# Patient Record
Sex: Male | Born: 1975 | Race: Black or African American | Hispanic: No | Marital: Married | Smoking: Never smoker
Health system: Southern US, Community
[De-identification: ages and names within clinical notes are randomized; demographics above are authoritative.]

## PROBLEM LIST (undated history)

## (undated) DIAGNOSIS — F32A Depression, unspecified: Secondary | ICD-10-CM

## (undated) DIAGNOSIS — Z973 Presence of spectacles and contact lenses: Secondary | ICD-10-CM

## (undated) DIAGNOSIS — G4730 Sleep apnea, unspecified: Secondary | ICD-10-CM

## (undated) DIAGNOSIS — F419 Anxiety disorder, unspecified: Secondary | ICD-10-CM

## (undated) DIAGNOSIS — G43909 Migraine, unspecified, not intractable, without status migrainosus: Secondary | ICD-10-CM

## (undated) DIAGNOSIS — R0981 Nasal congestion: Secondary | ICD-10-CM

## (undated) DIAGNOSIS — I10 Essential (primary) hypertension: Secondary | ICD-10-CM

## (undated) DIAGNOSIS — E119 Type 2 diabetes mellitus without complications: Secondary | ICD-10-CM

## (undated) DIAGNOSIS — E669 Obesity, unspecified: Secondary | ICD-10-CM

## (undated) DIAGNOSIS — F909 Attention-deficit hyperactivity disorder, unspecified type: Secondary | ICD-10-CM

## (undated) HISTORY — DX: Attention-deficit hyperactivity disorder, unspecified type: F90.9

## (undated) HISTORY — DX: Depression, unspecified: F32.A

## (undated) HISTORY — DX: Obesity, unspecified: E66.9

## (undated) HISTORY — DX: Migraine, unspecified, not intractable, without status migrainosus: G43.909

## (undated) HISTORY — DX: Anxiety disorder, unspecified: F41.9

## (undated) HISTORY — DX: Presence of spectacles and contact lenses: Z97.3

## (undated) HISTORY — DX: Essential (primary) hypertension: I10

---

## 2012-02-03 ENCOUNTER — Ambulatory Visit: Payer: Self-pay | Admitting: Urology

## 2014-05-15 ENCOUNTER — Ambulatory Visit (INDEPENDENT_AMBULATORY_CARE_PROVIDER_SITE_OTHER): Payer: BC Managed Care – PPO | Admitting: Medical

## 2014-05-15 ENCOUNTER — Encounter: Payer: Self-pay | Admitting: Medical

## 2014-05-15 VITALS — BP 120/82 | HR 88 | Temp 98.0°F | Resp 14 | Ht 68.0 in | Wt 342.0 lb

## 2014-05-15 DIAGNOSIS — Z9989 Dependence on other enabling machines and devices: Secondary | ICD-10-CM

## 2014-05-15 DIAGNOSIS — G4733 Obstructive sleep apnea (adult) (pediatric): Secondary | ICD-10-CM

## 2014-05-15 DIAGNOSIS — E669 Obesity, unspecified: Secondary | ICD-10-CM

## 2014-05-15 DIAGNOSIS — I10 Essential (primary) hypertension: Secondary | ICD-10-CM

## 2014-05-15 MED ORDER — BENAZEPRIL-HYDROCHLOROTHIAZIDE 20-12.5 MG PO TABS: 2.0000 | ORAL_TABLET | Freq: Every day | ORAL | Status: DC

## 2014-05-15 MED ORDER — AMLODIPINE BESYLATE 5 MG PO TABS: 5.0000 mg | ORAL_TABLET | Freq: Every day | ORAL | Status: DC

## 2014-05-15 NOTE — Progress Notes (Signed)
Subjective:  [name] is a 38 y.o. male presenting as a new patient here today to establish his primary care. Previously he was being [place] but lately it has become more difficult for him to make it all the way out there. His fiance comes here and scheduled his appointment today. He reports that his HTN was diagnosed two years ago. He is currently on 2 BP medications and reports excellent compliance. His home BP measurements are usually within normal limits but tends to run higher at night. He avoids added salt with his food, does not exercise. His last labs were done in 2014 with his previous PCP. He has also been diagnosed with OSA and uses a CPAP. No other aggravating or relieving factors.  Patient has no other questions or concerns.   Objective:  BP 120/82  Pulse 88  Temp(Src) 98 F (36.7 C)  Resp 14  Ht 5\' 8"  (1.727 m)  Wt 342 lb (155.13 kg)  BMI 52.01 kg/m2  General appearance: alert, no distress, WD/WN, morbidly obese African-American male  Heart: RRR, normal S1, S2, no murmurs Lungs: CTA bilaterally, no wheezes, rhonchi, or rales Pulses: 2+ symmetric, upper and lower extremities, normal cap refill Ext: no peripheral edema  Assessment & Plan: Encounter Diagnoses  Name Primary?  . Essential hypertension, benign Yes  . Obesity, unspecified   . OSA on CPAP     C/t same medication, need to exercise, need to work on weight loss, better diet.  C/t current medications, CPAP, and we will request prior records.

## 2014-05-23 ENCOUNTER — Telehealth: Payer: Self-pay | Admitting: Medical

## 2014-05-23 NOTE — Telephone Encounter (Signed)
Already sorted. CLS

## 2014-05-23 NOTE — Telephone Encounter (Signed)
Records received and a chart was made. I am sending records back to [facility] to review and [facility]

## 2014-08-05 ENCOUNTER — Emergency Department (HOSPITAL_COMMUNITY)
Admission: EM | Admit: 2014-08-05 | Discharge: 2014-08-05 | Disposition: A | Discharge status: Home / Self Care | Payer: BC Managed Care – PPO | Attending: Emergency Medicine | Admitting: Emergency Medicine

## 2014-08-05 ENCOUNTER — Emergency Department (HOSPITAL_COMMUNITY): Payer: BC Managed Care – PPO

## 2014-08-05 ENCOUNTER — Encounter (HOSPITAL_COMMUNITY): Payer: Self-pay | Admitting: Emergency Medicine

## 2014-08-05 DIAGNOSIS — I10 Essential (primary) hypertension: Secondary | ICD-10-CM | POA: Insufficient documentation

## 2014-08-05 DIAGNOSIS — R079 Chest pain, unspecified: Secondary | ICD-10-CM | POA: Insufficient documentation

## 2014-08-05 DIAGNOSIS — Z79899 Other long term (current) drug therapy: Secondary | ICD-10-CM | POA: Diagnosis not present

## 2014-08-05 DIAGNOSIS — K8020 Calculus of gallbladder without cholecystitis without obstruction: Secondary | ICD-10-CM

## 2014-08-05 DIAGNOSIS — R0602 Shortness of breath: Secondary | ICD-10-CM | POA: Diagnosis not present

## 2014-08-05 DIAGNOSIS — R319 Hematuria, unspecified: Secondary | ICD-10-CM | POA: Diagnosis not present

## 2014-08-05 DIAGNOSIS — R61 Generalized hyperhidrosis: Secondary | ICD-10-CM | POA: Diagnosis not present

## 2014-08-05 DIAGNOSIS — E876 Hypokalemia: Secondary | ICD-10-CM | POA: Insufficient documentation

## 2014-08-05 DIAGNOSIS — K8050 Calculus of bile duct without cholangitis or cholecystitis without obstruction: Secondary | ICD-10-CM

## 2014-08-05 DIAGNOSIS — R6883 Chills (without fever): Secondary | ICD-10-CM | POA: Diagnosis not present

## 2014-08-05 DIAGNOSIS — R1011 Right upper quadrant pain: Secondary | ICD-10-CM | POA: Diagnosis present

## 2014-08-05 LAB — URINALYSIS, ROUTINE W REFLEX MICROSCOPIC
Bilirubin Urine: NEGATIVE
Glucose, UA: 250 mg/dL — AB
Ketones, ur: 15 mg/dL — AB
Leukocytes, UA: NEGATIVE
NITRITE: NEGATIVE
Protein, ur: NEGATIVE mg/dL
Specific Gravity, Urine: 1.013 (ref 1.005–1.030)
Urobilinogen, UA: 0.2 mg/dL (ref 0.0–1.0)
pH: 7.5 (ref 5.0–8.0)

## 2014-08-05 LAB — CBC WITH DIFFERENTIAL/PLATELET
Basophils Absolute: 0 10*3/uL (ref 0.0–0.1)
Basophils Relative: 0 % (ref 0–1)
Eosinophils Absolute: 0 10*3/uL (ref 0.0–0.7)
Eosinophils Relative: 0 % (ref 0–5)
HEMATOCRIT: 38.1 % — AB (ref 39.0–52.0)
Hemoglobin: 13.2 g/dL (ref 13.0–17.0)
LYMPHS PCT: 13 % (ref 12–46)
Lymphs Abs: 0.9 10*3/uL (ref 0.7–4.0)
MCH: 27.9 pg (ref 26.0–34.0)
MCHC: 34.6 g/dL (ref 30.0–36.0)
MCV: 80.5 fL (ref 78.0–100.0)
MONO ABS: 0.4 10*3/uL (ref 0.1–1.0)
Monocytes Relative: 5 % (ref 3–12)
Neutro Abs: 5.7 10*3/uL (ref 1.7–7.7)
Neutrophils Relative %: 82 % — ABNORMAL HIGH (ref 43–77)
Platelets: 173 10*3/uL (ref 150–400)
RBC: 4.73 MIL/uL (ref 4.22–5.81)
RDW: 13.3 % (ref 11.5–15.5)
WBC: 7 10*3/uL (ref 4.0–10.5)

## 2014-08-05 LAB — COMPREHENSIVE METABOLIC PANEL
ALBUMIN: 4 g/dL (ref 3.5–5.2)
ALT: 27 U/L (ref 0–53)
AST: 24 U/L (ref 0–37)
Alkaline Phosphatase: 54 U/L (ref 39–117)
Anion gap: 15 (ref 5–15)
BILIRUBIN TOTAL: 0.8 mg/dL (ref 0.3–1.2)
BUN: 12 mg/dL (ref 6–23)
CHLORIDE: 95 meq/L — AB (ref 96–112)
CO2: 28 mEq/L (ref 19–32)
Calcium: 8.9 mg/dL (ref 8.4–10.5)
Creatinine, Ser: 0.88 mg/dL (ref 0.50–1.35)
GFR calc Af Amer: >90 mL/min (ref >90)
GFR calc non Af Amer: >90 mL/min (ref >90)
Glucose, Bld: 224 mg/dL — ABNORMAL HIGH (ref 70–99)
POTASSIUM: 3 meq/L — AB (ref 3.7–5.3)
SODIUM: 138 meq/L (ref 137–147)
Total Protein: 7.4 g/dL (ref 6.0–8.3)

## 2014-08-05 LAB — URINE MICROSCOPIC-ADD ON

## 2014-08-05 LAB — TROPONIN I: Troponin I: <0.3 ng/mL (ref <0.30)

## 2014-08-05 LAB — LIPASE, BLOOD: Lipase: 29 U/L (ref 11–59)

## 2014-08-05 MED ORDER — OXYCODONE-ACETAMINOPHEN 5-325 MG PO TABS: 1.0000 | ORAL_TABLET | Freq: Four times a day (QID) | ORAL | Status: DC | PRN

## 2014-08-05 MED ORDER — GI COCKTAIL ~~LOC~~
30.0000 mL | Freq: Once | ORAL | Status: AC
  Administered 2014-08-05: 30 mL via ORAL
  Filled 2014-08-05: qty 30

## 2014-08-05 MED ORDER — HYDROMORPHONE HCL 1 MG/ML IJ SOLN
1.0000 mg | Freq: Once | INTRAMUSCULAR | Status: AC
  Administered 2014-08-05: 1 mg via INTRAVENOUS
  Filled 2014-08-05: qty 1

## 2014-08-05 MED ORDER — SODIUM CHLORIDE 0.9 % IV BOLUS (SEPSIS)
500.0000 mL | Freq: Once | INTRAVENOUS | Status: AC
  Administered 2014-08-05: 500 mL via INTRAVENOUS

## 2014-08-05 MED ORDER — ONDANSETRON HCL 4 MG PO TABS: 4.0000 mg | ORAL_TABLET | Freq: Four times a day (QID) | ORAL | Status: DC

## 2014-08-05 MED ORDER — POTASSIUM CHLORIDE CRYS ER 20 MEQ PO TBCR
40.0000 meq | EXTENDED_RELEASE_TABLET | Freq: Once | ORAL | Status: AC
  Administered 2014-08-05: 40 meq via ORAL
  Filled 2014-08-05: qty 2

## 2014-08-05 MED ORDER — FENTANYL CITRATE 0.05 MG/ML IJ SOLN
100.0000 ug | Freq: Once | INTRAMUSCULAR | Status: AC
  Administered 2014-08-05: 100 ug via INTRAVENOUS
  Filled 2014-08-05: qty 2

## 2014-08-05 NOTE — ED Notes (Signed)
PT ambulated with baseline gait; VSS; A&Ox3; no signs of distress; respirations even and unlabored; skin warm and dry; no questions upon discharge.  

## 2014-08-05 NOTE — ED Notes (Signed)
Patient transported to Ultrasound 

## 2014-08-05 NOTE — ED Notes (Signed)
The pt has had epigastric pain for 3 days with bi-lateral radiation posteriorly.  He still has his gb

## 2014-08-05 NOTE — ED Provider Notes (Signed)
CSN: [id number]     Arrival date & time 08/05/14  4098 History   First MD Initiated Contact with Patient 08/05/14 [phone]     Chief Complaint  Patient presents with  . Abdominal Pain   Patient is a 38 y.o. male presenting with abdominal pain.  Abdominal Pain Associated symptoms: chest pain, chills, nausea, shortness of breath and vomiting   Associated symptoms: no constipation, no cough, no diarrhea, no dysuria, no fatigue, no fever and no hematuria     Patient is a 38 y.o. Male who presents to the ED with 3 day history of Epigastric pain x 3 days.  Per the patient and his wife the patient has had 3 days of constant waxing and waning epigastric and RUQ abdominal pain that radiates to the back.  This pain is associated with chest pain, shortness of breath, nausea, and one episode of vomiting.  Patient tried taking an antacid with no relief.  Patient has never had pain like this before.  Pain wakes the patient from sleep.  Patient has no history of abdominal surgeries.    Past Medical History  Diagnosis Date  . Hypertension    History reviewed. No pertinent past surgical history. No family history on file. History  Substance Use Topics  . Smoking status: Never Smoker   . Smokeless tobacco: Not on file  . Alcohol Use: Yes    Review of Systems  Constitutional: Positive for chills and diaphoresis. Negative for fever and fatigue.  Respiratory: Positive for shortness of breath. Negative for cough, chest tightness and wheezing.   Cardiovascular: Positive for chest pain. Negative for palpitations and leg swelling.  Gastrointestinal: Positive for nausea, vomiting and abdominal pain. Negative for diarrhea, constipation, blood in stool, abdominal distention, anal bleeding and rectal pain.  Genitourinary: Negative for dysuria, urgency, frequency and hematuria.  Musculoskeletal: Positive for back pain.  All other systems reviewed and are negative.     Allergies  Review of patient's allergies  indicates no known allergies.  Home Medications   Prior to Admission medications   Medication Sig Start Date End Date Taking? Authorizing Provider  benazepril-hydrochlorthiazide (LOTENSIN HCT) 20-12.5 MG per tablet Take 2 tablets by mouth daily.   Yes Historical Provider, MD  bismuth subsalicylate (PEPTO BISMOL) 262 MG chewable tablet Chew 524 mg by mouth as needed for indigestion.   Yes Historical Provider, MD  triamcinolone (NASACORT AQ) 55 MCG/ACT AERO nasal inhaler Place 2 sprays into the nose daily as needed (for allergies).   Yes Historical Provider, MD   BP 149/98  Pulse 90  Temp(Src) 97.9 F (36.6 C) (Oral)  Resp 20  Ht 5\' 8"  (1.727 m)  Wt 320 lb (145.151 kg)  BMI 48.67 kg/m2  SpO2 95% Physical Exam  Nursing note and vitals reviewed. Constitutional: He is oriented to person, place, and time. He appears well-developed and well-nourished. No distress.  HENT:  Head: Normocephalic and atraumatic.  Mouth/Throat: Oropharynx is clear and moist. No oropharyngeal exudate.  Eyes: Conjunctivae and EOM are normal. Pupils are equal, round, and reactive to light. No scleral icterus.  Neck: Normal range of motion. Neck supple. No JVD present. No thyromegaly present.  Cardiovascular: Normal rate, regular rhythm, normal heart sounds and intact distal pulses.  Exam reveals no gallop and no friction rub.   No murmur heard. Pulmonary/Chest: Effort normal and breath sounds normal. No respiratory distress. He has no wheezes. He has no rales. He exhibits no tenderness.  Abdominal: Soft. Normal appearance and bowel sounds are  normal. He exhibits no distension and no mass. There is tenderness in the right upper quadrant and epigastric area. There is positive Murphy's sign. There is no rigidity, no rebound, no guarding and no tenderness at McBurney's point.  Musculoskeletal: Normal range of motion.  Lymphadenopathy:    He has no cervical adenopathy.  Neurological: He is alert and oriented to person,  place, and time. No cranial nerve deficit. Coordination normal.  Skin: Skin is warm and dry. He is not diaphoretic.  Psychiatric: He has a normal mood and affect. His behavior is normal. Judgment and thought content normal.    ED Course  Procedures (including critical care time) Labs Review Labs Reviewed  COMPREHENSIVE METABOLIC PANEL - Abnormal; Notable for the following:    Potassium 3.0 (*)    Chloride 95 (*)    Glucose, Bld 224 (*)    All other components within normal limits  CBC WITH DIFFERENTIAL - Abnormal; Notable for the following:    HCT 38.1 (*)    Neutrophils Relative % 82 (*)    All other components within normal limits  URINALYSIS, ROUTINE W REFLEX MICROSCOPIC - Abnormal; Notable for the following:    Glucose, UA 250 (*)    Hgb urine dipstick MODERATE (*)    Ketones, ur 15 (*)    All other components within normal limits  LIPASE, BLOOD  TROPONIN I  URINE MICROSCOPIC-ADD ON    Imaging Review Dg Abd Acute W/chest  08/05/2014   CLINICAL DATA:  Upper abdominal pain for 2 days. Nausea and vomiting. Initial encounter.  EXAM: ACUTE ABDOMEN SERIES (ABDOMEN 2 VIEW & CHEST 1 VIEW)  COMPARISON:  None.  FINDINGS: Examination is degraded due to patient body habitus.  Borderline enlarged cardiac silhouette and mediastinal contours, possibly accentuated due to decreased lung volumes. Minimal bibasilar heterogeneous opacities, left greater than right. No focal airspace opacities. No pleural effusion or pneumothorax. No evidence of edema.  Paucity of bowel gas without definite evidence of obstruction. Unremarkable colonic stool burden. No pneumoperitoneum, pneumatosis or portal venous gas.  Several phleboliths overlie the right hemipelvis. No definitive abnormal opacities overlie the expected location of either renal fossa, ureter or the urinary bladder.  Unchanged bones.  IMPRESSION: 1. Cardiomegaly and bibasilar atelectasis without definite acute cardiopulmonary disease on this  hypoventilated examination. 2. Paucity of bowel gas without evidence of obstruction.   Electronically Signed   By: Simonne Come M.D.   On: 08/05/2014 07:52   US Abdomen Limited Ruq  08/05/2014   CLINICAL DATA:  Right upper quadrant pain for 3 days.  EXAM: US ABDOMEN LIMITED - RIGHT UPPER QUADRANT  COMPARISON:  None.  FINDINGS: Gallbladder:  Multiple mobile gallstones were seen, measuring up to 1.1 cm in size. Gallbladder wall was not thickened, measuring 2.1 mm. Sonographic Murphy sign was negative.  Common bile duct:  Diameter: 4.8 mm  Liver:  No focal lesion identified. Diffusely echogenic and coarse parenchymal echotexture.  IMPRESSION: 1. Cholelithiasis without evidence of acute cholecystitis. No biliary dilatation. 2. Diffusely echogenic liver, nonspecific but suggestive of hepatic steatosis.   Electronically Signed   By: Sebastian Ache   On: 08/05/2014 08:42     EKG Interpretation   Date/Time:  Saturday August 05 2014 06:02:11 EDT Ventricular Rate:  96 PR Interval:  187 QRS Duration: 86 QT Interval:  343 QTC Calculation: 433 R Axis:   25 Text Interpretation:  Sinus rhythm Borderline T wave abnormalities No  previous ECGs available Confirmed by Bebe Shaggy  MD, DONALD ([id number])  on  08/05/2014 6:07:39 AM      MDM   Final diagnoses:  Biliary colic  Calculus of gallbladder without cholecystitis without obstruction  Hypokalemia  Hematuria   Patient is a 38 y.o. Male who presents to the ED with RUQ pain.  Physical exam reveals tenderness to the right upper quadrant and epigastric area.  CBC unremarkable.  CMP reveals hypokalemia and mild hyperglycemia.  Lipase is unremarkable.  LFTs are normal.  Troponin is negative.  Acute abdomen and chest is negative for acute obstruction or PNA.  EKG shows some mild t wave abnormalities, but there are no previous ekgs to compare to.  RUQ ultrasound reveals cholelithiasis with no evidence of cholecystitis or common bile duct blockage.  Patient does have  hematuria on UA, but states that he has had hematuria before.  Bedside ultrasound performed by Dr. Jodi MourningZavitz reveals no evidence of hydronephrosis or kidney stone.  Suspect that pain is likely due to biliary colic.  Will discharge the patient home and with percocet and zofran for sympomatic control.  Patient to follow-up with general surgery and with urology.  Patient to return to the ED for fever, intractable nausea and vomiting, and intractable pain.  Patient states understanding and agreement.  Patient is stable for discharge at this time.  Patient has also been examined by Dr. Jodi MourningZavitz who agrees with the above plan and workup.      Eben Burowourtney A Forcucci, PA-C 08/05/14 [phone]

## 2014-08-05 NOTE — Discharge Instructions (Signed)
Biliary Colic  °Biliary colic is a steady or irregular pain in the upper abdomen. It is usually under the right side of the rib cage. It happens when gallstones interfere with the normal flow of bile from the gallbladder. Bile is a liquid that helps to digest fats. Bile is made in the liver and stored in the gallbladder. When you eat a meal, bile passes from the gallbladder through the cystic duct and the common bile duct into the small intestine. There, it mixes with partially digested food. If a gallstone blocks either of these ducts, the normal flow of bile is blocked. The muscle cells in the bile duct contract forcefully to try to move the stone. This causes the pain of biliary colic.  °SYMPTOMS  °· A person with biliary colic usually complains of pain in the upper abdomen. This pain can be: °¨ In the center of the upper abdomen just below the breastbone. °¨ In the upper-right part of the abdomen, near the gallbladder and liver. °¨ Spread back toward the right shoulder blade. °· Nausea and vomiting. °· The pain usually occurs after eating. °· Biliary colic is usually triggered by the digestive system's demand for bile. The demand for bile is high after fatty meals. Symptoms can also occur when a person who has been fasting suddenly eats a very large meal. Most episodes of biliary colic pass after 1 to 5 hours. After the most intense pain passes, your abdomen may continue to ache mildly for about 24 hours. °DIAGNOSIS  °After you describe your symptoms, your caregiver will perform a physical exam. He or she will pay attention to the upper right portion of your belly (abdomen). This is the area of your liver and gallbladder. An ultrasound will help your caregiver look for gallstones. Specialized scans of the gallbladder may also be done. Blood tests may be done, especially if you have fever or if your pain persists. °PREVENTION  °Biliary colic can be prevented by controlling the risk factors for gallstones. Some of  these risk factors, such as heredity, increasing age, and pregnancy are a normal part of life. Obesity and a high-fat diet are risk factors you can change through a healthy lifestyle. Women going through menopause who take hormone replacement therapy (estrogen) are also more likely to develop biliary colic. °TREATMENT  °· Pain medication may be prescribed. °· You may be encouraged to eat a fat-free diet. °· If the first episode of biliary colic is severe, or episodes of colic keep retuning, surgery to remove the gallbladder (cholecystectomy) is usually recommended. This procedure can be done through small incisions using an instrument called a laparoscope. The procedure often requires a brief stay in the hospital. Some people can leave the hospital the same day. It is the most widely used treatment in people troubled by painful gallstones. It is effective and safe, with no complications in more than 90% of cases. °· If surgery cannot be done, medication that dissolves gallstones may be used. This medication is expensive and can take months or years to work. Only small stones will dissolve. °· Rarely, medication to dissolve gallstones is combined with a procedure called shock-wave lithotripsy. This procedure uses carefully aimed shock waves to break up gallstones. In many people treated with this procedure, gallstones form again within a few years. °PROGNOSIS  °If gallstones block your cystic duct or common bile duct, you are at risk for repeated episodes of biliary colic. There is also a 25% chance that you will develop   a gallbladder infection(acute cholecystitis), or some other complication of gallstones within 10 to 20 years. If you have surgery, schedule it at a time that is convenient for you and at a time when you are not sick. HOME CARE INSTRUCTIONS   Drink plenty of clear fluids.  Avoid fatty, greasy or fried foods, or any foods that make your pain worse.  Take medications as directed. SEEK MEDICAL  CARE IF:   You develop a fever over 100.5 F (38.1 C).  Your pain gets worse over time.  You develop nausea that prevents you from eating and drinking.  You develop vomiting. SEEK IMMEDIATE MEDICAL CARE IF:   You have continuous or severe belly (abdominal) pain which is not relieved with medications.  You develop nausea and vomiting which is not relieved with medications.  You have symptoms of biliary colic and you suddenly develop a fever and shaking chills. This may signal cholecystitis. Call your caregiver immediately.  You develop a yellow color to your skin or the white part of your eyes (jaundice). Document Released: 03/23/2006 Document Revised: 01/12/2012 Document Reviewed: 06/01/2008 [facility] Patient Information 2015 [place]. This information is not intended to replace advice given to you by your health care provider. Make sure you discuss any questions you have with your health care provider.  Hematuria Hematuria is blood in your urine. It can be caused by a bladder infection, kidney infection, prostate infection, kidney stone, or cancer of your urinary tract. Infections can usually be treated with medicine, and a kidney stone usually will pass through your urine. If neither of these is the cause of your hematuria, further workup to find out the reason may be needed. It is very important that you tell your health care provider about any blood you see in your urine, even if the blood stops without treatment or happens without causing pain. Blood in your urine that happens and then stops and then happens again can be a symptom of a very serious condition. Also, pain is not a symptom in the initial stages of many urinary cancers. HOME CARE INSTRUCTIONS   Drink lots of fluid, 3-4 quarts a day. If you have been diagnosed with an infection, cranberry juice is especially recommended, in addition to large amounts of water.  Avoid caffeine, tea, and carbonated beverages because  they tend to irritate the bladder.  Avoid alcohol because it may irritate the prostate.  Take all medicines as directed by your health care provider.  If you were prescribed an antibiotic medicine, finish it all even if you start to feel better.  If you have been diagnosed with a kidney stone, follow your health care provider's instructions regarding straining your urine to catch the stone.  Empty your bladder often. Avoid holding urine for long periods of time.  After a bowel movement, women should cleanse front to back. Use each tissue only once.  Empty your bladder before and after sexual intercourse if you are a male. SEEK MEDICAL CARE IF:  You develop back pain.  You have a fever.  You have a feeling of sickness in your stomach (nausea) or vomiting.  Your symptoms are not better in 3 days. Return sooner if you are getting worse. SEEK IMMEDIATE MEDICAL CARE IF:   You develop severe vomiting and are unable to keep the medicine down.  You develop severe back or abdominal pain despite taking your medicines.  You begin passing a large amount of blood or clots in your urine.  You feel  extremely weak or faint, or you pass out. MAKE SURE YOU:   Understand these instructions.  Will watch your condition.  Will get help right away if you are not doing well or get worse. Document Released: 10/20/2005 Document Revised: 03/06/2014 Document Reviewed: 06/20/2013 [facility] Patient Information 2015 [place], [place]. This information is not intended to replace advice given to you by your health care provider. Make sure you discuss any questions you have with your health care provider.

## 2014-08-05 NOTE — ED Notes (Addendum)
Pt returned from xray and US

## 2014-08-05 NOTE — ED Notes (Signed)
Pt has not taken BP meds last night. MD aware.

## 2014-08-05 NOTE — ED Notes (Signed)
Pt to wait in hallway at nurses station for wife to pick him up.

## 2014-08-07 ENCOUNTER — Encounter: Payer: Self-pay | Admitting: Family Medicine

## 2014-08-07 ENCOUNTER — Other Ambulatory Visit: Payer: Self-pay | Admitting: Medical

## 2014-08-07 ENCOUNTER — Telehealth: Payer: Self-pay | Admitting: Medical

## 2014-08-07 ENCOUNTER — Encounter: Payer: Self-pay | Admitting: Medical

## 2014-08-07 ENCOUNTER — Ambulatory Visit (INDEPENDENT_AMBULATORY_CARE_PROVIDER_SITE_OTHER): Payer: BC Managed Care – PPO | Admitting: Medical

## 2014-08-07 VITALS — BP 112/88 | HR 88 | Temp 98.2°F | Resp 16 | Wt 334.0 lb

## 2014-08-07 DIAGNOSIS — R7301 Impaired fasting glucose: Secondary | ICD-10-CM

## 2014-08-07 DIAGNOSIS — R3129 Other microscopic hematuria: Secondary | ICD-10-CM

## 2014-08-07 DIAGNOSIS — R1010 Upper abdominal pain, unspecified: Secondary | ICD-10-CM

## 2014-08-07 DIAGNOSIS — R3589 Other polyuria: Secondary | ICD-10-CM

## 2014-08-07 DIAGNOSIS — R358 Other polyuria: Secondary | ICD-10-CM

## 2014-08-07 DIAGNOSIS — R312 Other microscopic hematuria: Secondary | ICD-10-CM

## 2014-08-07 DIAGNOSIS — K8020 Calculus of gallbladder without cholecystitis without obstruction: Secondary | ICD-10-CM

## 2014-08-07 DIAGNOSIS — R631 Polydipsia: Secondary | ICD-10-CM

## 2014-08-07 LAB — CBC
HCT: 40 % (ref 39.0–52.0)
Hemoglobin: 13.8 g/dL (ref 13.0–17.0)
MCH: 28.4 pg (ref 26.0–34.0)
MCHC: 34.5 g/dL (ref 30.0–36.0)
MCV: 82.3 fL (ref 78.0–100.0)
Platelets: 168 10*3/uL (ref 150–400)
RBC: 4.86 MIL/uL (ref 4.22–5.81)
RDW: 14.4 % (ref 11.5–15.5)
WBC: 6.8 10*3/uL (ref 4.0–10.5)

## 2014-08-07 LAB — POCT URINALYSIS DIPSTICK
BILIRUBIN UA: NEGATIVE
Glucose, UA: NEGATIVE
Ketones, UA: NEGATIVE
Leukocytes, UA: NEGATIVE
NITRITE UA: NEGATIVE
Spec Grav, UA: <=1.005
Urobilinogen, UA: 4
pH, UA: 7

## 2014-08-07 LAB — COMPREHENSIVE METABOLIC PANEL
ALK PHOS: 54 U/L (ref 39–117)
ALT: 22 U/L (ref 0–53)
AST: 14 U/L (ref 0–37)
Albumin: 4.2 g/dL (ref 3.5–5.2)
BILIRUBIN TOTAL: 1.6 mg/dL — AB (ref 0.2–1.2)
BUN: 10 mg/dL (ref 6–23)
CO2: 32 mEq/L (ref 19–32)
Calcium: 8.6 mg/dL (ref 8.4–10.5)
Chloride: 95 mEq/L — ABNORMAL LOW (ref 96–112)
Creat: 1.16 mg/dL (ref 0.50–1.35)
Glucose, Bld: 141 mg/dL — ABNORMAL HIGH (ref 70–99)
Potassium: 2.8 mEq/L — ABNORMAL LOW (ref 3.5–5.3)
SODIUM: 139 meq/L (ref 135–145)
TOTAL PROTEIN: 6.9 g/dL (ref 6.0–8.3)

## 2014-08-07 LAB — POCT GLYCOSYLATED HEMOGLOBIN (HGB A1C): Hemoglobin A1C: 7.2

## 2014-08-07 LAB — POCT CBG (FASTING - GLUCOSE)-MANUAL ENTRY: Glucose Fasting, POC: 164 mg/dL — AB (ref 70–99)

## 2014-08-07 MED ORDER — ONDANSETRON HCL 4 MG PO TABS: 4.0000 mg | ORAL_TABLET | Freq: Three times a day (TID) | ORAL | Status: DC | PRN

## 2014-08-07 MED ORDER — OXYCODONE-ACETAMINOPHEN 5-325 MG PO TABS: 1.0000 | ORAL_TABLET | ORAL | Status: DC | PRN

## 2014-08-07 NOTE — Telephone Encounter (Signed)
Patient is aware of his appointment to have [facility] CT [facility]08[facility]14 [facility] CLS

## 2014-08-07 NOTE — Telephone Encounter (Signed)
pls set up for CT abdomen and pelvis - Dx: abdomina pain, gall stones, biliary colic, blood in urine.   Preferably for today or tomorrow

## 2014-08-07 NOTE — Progress Notes (Signed)
Subjective: Here for hospital f/u.  Of note, there are 2 medical records since he is [facility] patient.  I reviewed the other record with same name, DOB and address.  Other record listed as [id number].  Here with fiance for hospital f/u.  I have seen him once prior in July for BP and Obesity concerns.   Went to [facility] Friday, 08/05/14 for severe abdominal pain across abdomen, some vomiting, nausea.   Diagnosed with biliary colic, told he needs to f/u with PCM and needs gall bladder out.   Had abdominal ultrasound.   Had gallstones.   Had labs.  symptoms today include nausea, abdominal pain, less severe pain than at the ED.  No appetite.  No blurred vision, but constant headache the last few days.  He does note polyuria and polydipsia.   No other aggravating or relieving factors.  No other c/o.    Past Medical History  Diagnosis Date  . Obesity   . Hypertension   . Migraine    Family hx/o with father end stage renal disease due to diabetes.   ROS as in subjective   Objective: BP 112/88  Pulse 88  Temp(Src) 98.2 F (36.8 C) (Oral)  Resp 16  Wt 334 lb (151.501 kg)  General appearance: alert, no distress, WD/WN, obese AA male Neck: supple, no lymphadenopathy, no thyromegaly, no masses Heart: RRR, normal S1, S2, no murmurs Lungs: CTA bilaterally, no wheezes, rhonchi, or rales Abdomen: +bs, soft, obese abdomen, moderate tenderness RUQ, RLQ, central abdomen, +murphys sign, no rebound or guarding, non distended, no masses, no hepatomegaly, no splenomegaly Back: nontender Pulses: 2+ symmetric, upper and lower extremities, normal cap refill Ext: no edema  Results for orders placed in visit on 08/07/14 (from the past 24 hour(s))  POCT GLYCOSYLATED HEMOGLOBIN (HGB A1C)     Status: None   Collection Time    08/07/14 11:50 AM      Result Value Ref Range   Hemoglobin A1C 7.2    POCT CBG (FASTING - GLUCOSE)-MANUAL ENTRY     Status: None   Collection Time   08/07/14 11:50 AM      Result Value Ref Range   Glucose Fasting, POC 164 lbs  70 - 99 mg/dL  POCT URINALYSIS DIPSTICK     Status: None   Collection Time    08/07/14 11:52 AM      Result Value Ref Range   Color, UA DK. yellow     Clarity, UA clear     Glucose, UA neg     Bilirubin, UA neg     Ketones, UA neg     Spec Grav, UA <=1.005     Blood, UA large     pH, UA 7.0     Protein, UA moderate     Urobilinogen, UA 4.0     Nitrite, UA neg     Leukocytes, UA Negative       Assessment: Encounter Diagnoses  Name Primary?  . Pain of upper abdomen Yes  . Gallstones   . Polydipsia   . Polyuria   . Microscopic hematuria   . Impaired fasting glucose      Plan: I reviewed ED report, labs, [place] from the other medical record documentation noted above.   Potassium was low, glucose was elevated over 200, and he was + for blood and glucose in the urine.  Urinalysis today with protein, large blood, urobilinogen, pH 7.  Glucose fingerstick and HgbA1C suggests new onset  diabetes.    At this point, pain may be attributed to gall stones, but can't rule out other, particularly in light of urine findings and recent labs and ultrasound.  Will recheck CMET lab today, will set up CT abdomen and pelvis, urine micro and culture sent.  He has a new diagnosis of diabetes given the findings.  We discussed diet, weight loss, and will f/u on this once we have the additional results.    He has tentative schedule already with general surgery consult this Friday.  He will keep that appt for now.   Urology consult may or may not be needed pending additional labs.   Refilled zofran and pain medication, note for work for today and tomorrow.    F/u pending additional studies.

## 2014-08-08 ENCOUNTER — Other Ambulatory Visit: Payer: Self-pay | Admitting: Medical

## 2014-08-08 ENCOUNTER — Ambulatory Visit
Admission: RE | Admit: 2014-08-08 | Discharge: 2014-08-08 | Disposition: A | Discharge status: Home / Self Care | Payer: BC Managed Care – PPO | Source: Ambulatory Visit | Attending: Medical | Admitting: Medical

## 2014-08-08 DIAGNOSIS — R109 Unspecified abdominal pain: Principal | ICD-10-CM

## 2014-08-08 DIAGNOSIS — R3589 Other polyuria: Secondary | ICD-10-CM

## 2014-08-08 DIAGNOSIS — R1010 Upper abdominal pain, unspecified: Secondary | ICD-10-CM

## 2014-08-08 DIAGNOSIS — K8020 Calculus of gallbladder without cholecystitis without obstruction: Secondary | ICD-10-CM

## 2014-08-08 DIAGNOSIS — R631 Polydipsia: Secondary | ICD-10-CM

## 2014-08-08 DIAGNOSIS — R7301 Impaired fasting glucose: Secondary | ICD-10-CM

## 2014-08-08 DIAGNOSIS — R102 Pelvic and perineal pain: Secondary | ICD-10-CM

## 2014-08-08 DIAGNOSIS — R3129 Other microscopic hematuria: Secondary | ICD-10-CM

## 2014-08-08 DIAGNOSIS — R358 Other polyuria: Secondary | ICD-10-CM

## 2014-08-08 LAB — URINALYSIS, MICROSCOPIC ONLY
BACTERIA UA: NONE SEEN
Casts: NONE SEEN
Crystals: NONE SEEN

## 2014-08-08 LAB — LIPASE: LIPASE: 15 U/L (ref 0–75)

## 2014-08-08 LAB — AMYLASE: AMYLASE: 34 U/L (ref 0–105)

## 2014-08-08 MED ORDER — POTASSIUM CHLORIDE CRYS ER 20 MEQ PO TBCR: 20.0000 meq | EXTENDED_RELEASE_TABLET | Freq: Two times a day (BID) | ORAL | Status: DC

## 2014-08-08 MED ORDER — IOHEXOL 300 MG/ML  SOLN
150.0000 mL | Freq: Once | INTRAMUSCULAR | Status: AC | PRN
  Administered 2014-08-08: 150 mL via INTRAVENOUS

## 2014-08-09 ENCOUNTER — Encounter: Payer: Self-pay | Admitting: Family Medicine

## 2014-08-09 ENCOUNTER — Telehealth: Payer: Self-pay | Admitting: Family Medicine

## 2014-08-09 ENCOUNTER — Other Ambulatory Visit: Payer: Self-pay

## 2014-08-09 ENCOUNTER — Other Ambulatory Visit (INDEPENDENT_AMBULATORY_CARE_PROVIDER_SITE_OTHER): Payer: BC Managed Care – PPO | Admitting: Medical

## 2014-08-09 ENCOUNTER — Other Ambulatory Visit: Payer: BC Managed Care – PPO

## 2014-08-09 ENCOUNTER — Other Ambulatory Visit: Payer: Self-pay | Admitting: Medical

## 2014-08-09 DIAGNOSIS — R7301 Impaired fasting glucose: Secondary | ICD-10-CM

## 2014-08-09 DIAGNOSIS — R319 Hematuria, unspecified: Secondary | ICD-10-CM

## 2014-08-09 LAB — POCT URINALYSIS DIPSTICK
BILIRUBIN UA: POSITIVE
Blood, UA: 250
Glucose, UA: NEGATIVE
KETONES UA: NEGATIVE
Nitrite, UA: POSITIVE
Protein, UA: 100
SPEC GRAV UA: 1.015
UROBILINOGEN UA: 4
pH, UA: 6

## 2014-08-09 LAB — URINE CULTURE
COLONY COUNT: NO GROWTH
ORGANISM ID, BACTERIA: NO GROWTH

## 2014-08-09 LAB — GLUCOSE, POCT (MANUAL RESULT ENTRY): POC GLUCOSE: 109 mg/dL — AB (ref 70–99)

## 2014-08-09 MED ORDER — METFORMIN HCL 500 MG PO TABS: 500.0000 mg | ORAL_TABLET | Freq: Two times a day (BID) | ORAL | Status: DC

## 2014-08-09 MED ORDER — ONDANSETRON HCL 4 MG PO TABS: 4.0000 mg | ORAL_TABLET | Freq: Three times a day (TID) | ORAL | Status: DC | PRN

## 2014-08-09 MED ORDER — CIPROFLOXACIN HCL 500 MG PO TABS: 500.0000 mg | ORAL_TABLET | Freq: Two times a day (BID) | ORAL | Status: DC

## 2014-08-09 NOTE — Progress Notes (Signed)
Subjective: Here for hospital f/u.  Of note, there are 2 medical records since he is [facility] patient.  I reviewed the other record with same name, DOB and address.  Other record listed as [id number].  Here with fiance for recheck.  He notes no improvement in the last 2 days, no worse no better.  Still using pain medication, zofran.    [facility] Friday, 08/05/14 for severe abdominal pain across abdomen, some vomiting, nausea.   Diagnosed with biliary colic, told he needs to f/u with PCM and needs gall bladder out.   Had abdominal ultrasound.   Had gallstones.   Had labs.  symptoms c/t to include nausea, abdominal pain, less severe pain than at the ED.  No appetite.  No blurred vision, but constant headache the last few days.  He does note polyuria and polydipsia.   No other aggravating or relieving factors.  No other c/o.    Past Medical History  Diagnosis Date  . Obesity   . Hypertension   . Migraine    Family hx/o with father end stage renal disease due to diabetes.   ROS as in subjective   Objective: General appearance: alert, no distress, WD/WN, obese AA male Neck: supple, no lymphadenopathy, no thyromegaly, no masses Heart: RRR, normal S1, S2, no murmurs Lungs: CTA bilaterally, no wheezes, rhonchi, or rales Abdomen: +bs, soft, obese abdomen, moderate tenderness RUQ, RLQ, central abdomen, +murphys sign, no rebound or guarding, non distended, no masses, no hepatomegaly, no splenomegaly Back: nontender Pulses: 2+ symmetric, upper and lower extremities, normal cap refill Ext: no edema DRE: prostate boggy and tender, occult negative stool  Results for orders placed in visit on 08/09/14 (from the past 24 hour(s))  POCT URINALYSIS DIPSTICK     Status: None   Collection Time    08/09/14  2:38 PM      Result Value Ref Range   Color, UA yellow     Clarity, UA clear     Glucose, UA neg     Bilirubin, UA positive     Ketones, UA neg     Spec Grav,  UA 1.015     Blood, UA 250     pH, UA 6.0     Protein, UA 100     Urobilinogen, UA 4.0     Nitrite, UA positive     Leukocytes, UA Trace    GLUCOSE, POCT (MANUAL RESULT ENTRY)     Status: Abnormal   Collection Time    08/09/14  4:14 PM      Result Value Ref Range   POC Glucose 109 (*) 70 - 99 mg/dl     Assessment: Encounter Diagnoses  Name Primary?  . Hematuria Yes  . Impaired fasting glucose      Plan: Discussed case again with supervising physician, Dr. Susann GivensLalonde.  Again reviewed the recent ED visit, visit here with me and recent labs and imaging.   Begin Cipro for pyelonephritis vs prostate infection vs both.  C/t potassium supplement we started 2 days ago, begin Metformin once daily for a few days then BID, c/t pain medication and zofran prn.  hydrate well, c/t to limit solid intake, avoid fatty and greasy foods, avoid large portions.    Call back Friday with symptom update, return sooner prn.    Of note, he has pending appt with general surgery he made for possible gall bladder removal and has Urology appt Tuesday he made.  Advised close f/u  with me the rest of this week.

## 2014-08-09 NOTE — Telephone Encounter (Signed)
Annabelle HarmanDana (pt wife) called for CT scan results.  Please call her at [phone]

## 2014-08-09 NOTE — Telephone Encounter (Signed)
The results aren't in.     Please call [place] [facility] this morning to inquire?

## 2014-08-09 NOTE — Telephone Encounter (Signed)
Vincenza HewsShane, I called out the Cipro 500 mg and the Metformin 500 mg to the [facility] pharmacy per your request. CLS

## 2014-08-09 NOTE — Telephone Encounter (Signed)
Results are in there now. Please look at them.

## 2014-08-10 ENCOUNTER — Encounter: Payer: Self-pay | Admitting: Medical

## 2014-08-14 ENCOUNTER — Encounter (HOSPITAL_COMMUNITY): Payer: Self-pay | Admitting: Emergency Medicine

## 2014-08-14 ENCOUNTER — Telehealth: Payer: Self-pay | Admitting: Family Medicine

## 2014-08-14 NOTE — Telephone Encounter (Signed)
Patient's wife called this morning and states she miss our call on Friday. She states her husband did have some pain on Friday, but he doesn't have any pain today. She said the only symptoms he has today is that he is still fatigue. Do you have any other recommendations for them?

## 2014-08-14 NOTE — Telephone Encounter (Signed)
I assume he went back to work today.   If doing fine, finish at least 5 days of the antibiotic and either call at that point or recheck urine in a week to verify if improving or not regarding protein, bacteria, blood.  Either way, call us with symptoms update by this Friday as long as he is improving.

## 2014-08-14 NOTE — ED Provider Notes (Signed)
Medical screening examination/treatment/procedure(s) were conducted as a shared visit with non-physician practitioner(s) or resident  and myself.  I personally evaluated the patient during the encounter and agree with the findings.   I have personally reviewed any xrays and/ or EKG's with the provider and I agree with interpretation.   3 days of epigastric and right upper quadrant abdominal pain and intermittent. On exam patient is mild right upper quadrant and epigastric tenderness, no signs of peritonitis. Clinically likely biliary colic.  Fup outpt discussed, pain improved in the ED.  Emergency Focused Ultrasound Exam Limited retroperitoneal ultrasound of kidneys  Performed and interpreted by Dr. Jodi MourningZavitz Indication: flank pain Focused abdominal ultrasound with both kidneys imaged in transverse and longitudinal planes in real-time. Interpretation: no hydronephrosis visualized.   Images archived electronically   Labs Reviewed  COMPREHENSIVE METABOLIC PANEL - Abnormal; Notable for the following:    Potassium 3.0 (*)    Chloride 95 (*)    Glucose, Bld 224 (*)    All other components within normal limits  CBC WITH DIFFERENTIAL - Abnormal; Notable for the following:    HCT 38.1 (*)    Neutrophils Relative % 82 (*)    All other components within normal limits  URINALYSIS, ROUTINE W REFLEX MICROSCOPIC - Abnormal; Notable for the following:    Glucose, UA 250 (*)    Hgb urine dipstick MODERATE (*)    Ketones, ur 15 (*)    All other components within normal limits  LIPASE, BLOOD  TROPONIN I  URINE MICROSCOPIC-ADD ON   Upper abd pain, Hypokalemi  Enid SkeensJoshua M Nataly Pacifico, MD 08/14/14 1734

## 2014-08-14 NOTE — Telephone Encounter (Signed)
Patient's wife is aware of [name] message in detail. Patients wife states that the patient is feeling thirsty and dry mouth and I did make [name] aware of this. [name] advised me to tell the patient to hold off on his BP medication for 2 days and to drink plenty of fluids over the next 2 days. [name] CoveyShane [name] [facility] also advised that if the patient is no longer in any pain he can't hold him out of work. Patient is aware of this and he will return to work today.

## 2014-08-18 ENCOUNTER — Telehealth: Payer: Self-pay | Admitting: *Deleted

## 2014-08-18 ENCOUNTER — Other Ambulatory Visit (INDEPENDENT_AMBULATORY_CARE_PROVIDER_SITE_OTHER): Payer: BC Managed Care – PPO

## 2014-08-18 DIAGNOSIS — R319 Hematuria, unspecified: Secondary | ICD-10-CM

## 2014-08-18 LAB — POCT URINALYSIS DIPSTICK
BILIRUBIN UA: NEGATIVE
Glucose, UA: 50
KETONES UA: NEGATIVE
LEUKOCYTES UA: NEGATIVE
Nitrite, UA: NEGATIVE
PH UA: 6
Protein, UA: NEGATIVE
SPEC GRAV UA: 1.01
Urobilinogen, UA: NEGATIVE

## 2014-08-18 NOTE — Telephone Encounter (Signed)
Just wanted to let you know I sent you a follow up UA that was on lab schedule for this patient. He did finish the 5 days of Cipro and was asymptomatic. UA showed 2+ blood and 50 glucose.

## 2014-08-21 NOTE — Telephone Encounter (Signed)
See msg

## 2014-08-21 NOTE — Telephone Encounter (Signed)
Call and see how he is doing, what symptoms currently?  Still having belly pain, right upper belly pain?     If feeling back to normal, then lets refer to Urology unless he saw them already?  Please find out.

## 2014-08-21 NOTE — Telephone Encounter (Signed)
NO VOICEMAIL  

## 2014-08-22 NOTE — Telephone Encounter (Signed)
[name] - please complete the other required fields in both FMLA sets of paper  Front office - charge the usual [employer] fee, copy, return/scan, etc.

## 2014-08-22 NOTE — Telephone Encounter (Signed)
Patient states that he miss 08/07/14 thru 08/11/14 out of work. He miss the whole week. He said that he is still having some belly pain in the right upper area bu not severe.

## 2014-08-23 ENCOUNTER — Telehealth: Payer: Self-pay | Admitting: Medical

## 2014-08-23 NOTE — Telephone Encounter (Signed)
Pt was called and spouse answered. She was informed that fmla paperwork is ready. Also informed that a $25 form fee will need to be collected.

## 2014-08-23 NOTE — Telephone Encounter (Signed)
Forms are completed, copied, and given to [name] MessierKathy to apply the fee

## 2014-08-31 ENCOUNTER — Telehealth: Payer: Self-pay | Admitting: Medical

## 2014-08-31 ENCOUNTER — Ambulatory Visit (INDEPENDENT_AMBULATORY_CARE_PROVIDER_SITE_OTHER): Payer: BC Managed Care – PPO | Admitting: Medical

## 2014-08-31 ENCOUNTER — Encounter: Payer: Self-pay | Admitting: Medical

## 2014-08-31 VITALS — BP 150/94 | HR 100 | Temp 98.1°F | Resp 17 | Wt 332.0 lb

## 2014-08-31 DIAGNOSIS — R1011 Right upper quadrant pain: Secondary | ICD-10-CM

## 2014-08-31 DIAGNOSIS — R809 Proteinuria, unspecified: Secondary | ICD-10-CM

## 2014-08-31 DIAGNOSIS — IMO0002 Reserved for concepts with insufficient information to code with codable children: Secondary | ICD-10-CM

## 2014-08-31 DIAGNOSIS — E1165 Type 2 diabetes mellitus with hyperglycemia: Secondary | ICD-10-CM

## 2014-08-31 DIAGNOSIS — R319 Hematuria, unspecified: Secondary | ICD-10-CM

## 2014-08-31 DIAGNOSIS — E669 Obesity, unspecified: Secondary | ICD-10-CM

## 2014-08-31 LAB — POCT URINALYSIS DIPSTICK
BILIRUBIN UA: NEGATIVE
Blood, UA: POSITIVE
GLUCOSE UA: NEGATIVE
Ketones, UA: POSITIVE
Leukocytes, UA: NEGATIVE
NITRITE UA: NEGATIVE
PH UA: 7.5
Protein, UA: POSITIVE
Spec Grav, UA: 1.015
Urobilinogen, UA: NEGATIVE

## 2014-08-31 NOTE — Telephone Encounter (Signed)
Set up HIDA scan  Make sure we/he has appt lined up with general surgery for possible gall bladder surgery  Make sure he has appt time with Urology for hematuria, recent prostate infection, ongoing hematuria and proteinuria

## 2014-08-31 NOTE — Progress Notes (Signed)
Subjective: Here for recheck on abdominal pain again.  I last saw him for this 08/07/14 . He did improve on the treatment from last visit.  However in the last 2 days he has had recurrence of quite significant RUQ pain, worse after meals after fatty foods.  Currently mild pain. No NVD, normal BMs lately, no decreased appetite.   Mainly just pain.   From last visit with blood in urine, he had made appt for urology today, but was late for that appt and this appt here.   He has appt scheduled for initial consult with general surgery late November. He is taking metformin from the recent new diabetes diagnosis from me.  No other aggravating or relieving factors.  No other c/o.   Past Medical History  Diagnosis Date  . Obesity   . Hypertension   . Migraine    Family hx/o with father end stage renal disease due to diabetes.   ROS as in subjective   Objective: BP 150/94  Pulse 100  Temp(Src) 98.1 F (36.7 C) (Oral)  Resp 17  Wt 332 lb (150.594 kg)  General appearance: alert, no distress, WD/WN, obese AA male Neck: supple, no lymphadenopathy, no thyromegaly, no masses Heart: RRR, normal S1, S2, no murmurs Lungs: CTA bilaterally, no wheezes, rhonchi, or rales Abdomen: +bs, soft, obese abdomen, moderate tenderness RUQ, +murphy's sign, no rebound or guarding, non distended, no masses, no hepatomegaly, no splenomegaly Back: nontender Pulses: 2+ symmetric, upper and lower extremities, normal cap refill Ext: no edema  Results for orders placed in visit on 08/31/14 (from the past 24 hour(s))  POCT URINALYSIS DIPSTICK     Status: None   Collection Time    08/31/14  1:18 PM      Result Value Ref Range   Color, UA yellow     Clarity, UA clear     Glucose, UA n     Bilirubin, UA n     Ketones, UA positive     Spec Grav, UA 1.015     Blood, UA positive     pH, UA 7.5     Protein, UA positive     Urobilinogen, UA negative     Nitrite, UA n     Leukocytes, UA Negative        Assessment: Encounter Diagnoses  Name Primary?  . Right upper quadrant pain Yes  . Hematuria   . Proteinuria   . Obesity   . Diabetes type 2, uncontrolled      Plan: C/t pain medication he already has, c/t low fat diet, avoid greasy/fried foods, will set up for HIDA scan.   C/t plan to see general surgeon and urology.    Go to ED if severe pain.   F/u here in a month on diabetes.

## 2014-09-04 ENCOUNTER — Other Ambulatory Visit: Payer: Self-pay | Admitting: Family Medicine

## 2014-09-04 ENCOUNTER — Other Ambulatory Visit (HOSPITAL_COMMUNITY): Payer: Self-pay | Admitting: Pediatrics

## 2014-09-04 DIAGNOSIS — R1011 Right upper quadrant pain: Secondary | ICD-10-CM

## 2014-09-04 NOTE — Telephone Encounter (Signed)
I fax over his referral to [facility] and they will contact the patient for his appointment.

## 2014-09-04 NOTE — Telephone Encounter (Signed)
Patient has an appointment with [facility] on 10/04/2014 @ 1245 with Dr. McDermett.

## 2014-09-04 NOTE — Telephone Encounter (Signed)
HIDA scan is on 09/11/14 @ arriving 715 am at [facility] NPO 8 hours prior and no stomach medications.

## 2014-09-05 ENCOUNTER — Other Ambulatory Visit: Payer: Self-pay | Admitting: Family Medicine

## 2014-09-05 DIAGNOSIS — R319 Hematuria, unspecified: Secondary | ICD-10-CM

## 2014-09-05 DIAGNOSIS — K829 Disease of gallbladder, unspecified: Secondary | ICD-10-CM

## 2014-09-05 NOTE — Telephone Encounter (Signed)
Patient is aware of his appointment's and he is also aware that [facility] will contact him for his general surgery consult appointment

## 2014-09-11 ENCOUNTER — Ambulatory Visit (HOSPITAL_COMMUNITY)
Admission: RE | Admit: 2014-09-11 | Discharge: 2014-09-11 | Disposition: A | Discharge status: Home / Self Care | Payer: BC Managed Care – PPO | Source: Ambulatory Visit | Attending: Diagnostic Radiology | Admitting: Diagnostic Radiology

## 2014-09-11 DIAGNOSIS — K828 Other specified diseases of gallbladder: Secondary | ICD-10-CM | POA: Insufficient documentation

## 2014-09-11 DIAGNOSIS — R1011 Right upper quadrant pain: Secondary | ICD-10-CM | POA: Diagnosis present

## 2014-09-11 MED ORDER — SINCALIDE 5 MCG IJ SOLR
0.0200 ug/kg | Freq: Once | INTRAMUSCULAR | Status: AC
  Administered 2014-09-11: 2 ug via INTRAVENOUS

## 2014-09-11 MED ORDER — TECHNETIUM TC 99M MEBROFENIN IV KIT
5.5000 | PACK | Freq: Once | INTRAVENOUS | Status: AC | PRN
  Administered 2014-09-11: 6 via INTRAVENOUS

## 2014-09-15 ENCOUNTER — Other Ambulatory Visit (INDEPENDENT_AMBULATORY_CARE_PROVIDER_SITE_OTHER): Payer: Self-pay | Admitting: Surgery

## 2014-10-03 ENCOUNTER — Other Ambulatory Visit: Payer: Self-pay | Admitting: Family Medicine

## 2014-10-03 ENCOUNTER — Encounter (HOSPITAL_BASED_OUTPATIENT_CLINIC_OR_DEPARTMENT_OTHER): Payer: Self-pay | Admitting: *Deleted

## 2014-10-03 ENCOUNTER — Telehealth: Payer: Self-pay | Admitting: Medical

## 2014-10-03 DIAGNOSIS — R0981 Nasal congestion: Secondary | ICD-10-CM

## 2014-10-03 HISTORY — DX: Nasal congestion: R09.81

## 2014-10-03 MED ORDER — BENAZEPRIL-HYDROCHLOROTHIAZIDE 20-12.5 MG PO TABS: 2.0000 | ORAL_TABLET | Freq: Every day | ORAL | Status: DC

## 2014-10-03 MED ORDER — AMLODIPINE BESYLATE 5 MG PO TABS: 5.0000 mg | ORAL_TABLET | Freq: Every day | ORAL | Status: DC

## 2014-10-03 NOTE — Pre-Procedure Instructions (Signed)
To come for BMET and anesthesia airway evaluation; BMI and hx. discussed with Dr. Ivin Bootyrews; pt. may need to stay overnight after surgery

## 2014-10-03 NOTE — Telephone Encounter (Signed)
I sent the 2 medications to his pharmacy

## 2014-10-04 ENCOUNTER — Encounter (HOSPITAL_BASED_OUTPATIENT_CLINIC_OR_DEPARTMENT_OTHER)
Admission: RE | Admit: 2014-10-04 | Discharge: 2014-10-04 | Disposition: A | Discharge status: Home / Self Care | Payer: BC Managed Care – PPO | Source: Ambulatory Visit | Attending: Surgery | Admitting: Surgery

## 2014-10-04 DIAGNOSIS — I10 Essential (primary) hypertension: Secondary | ICD-10-CM | POA: Diagnosis not present

## 2014-10-04 DIAGNOSIS — K8010 Calculus of gallbladder with chronic cholecystitis without obstruction: Secondary | ICD-10-CM | POA: Diagnosis present

## 2014-10-04 DIAGNOSIS — Z87442 Personal history of urinary calculi: Secondary | ICD-10-CM | POA: Diagnosis not present

## 2014-10-04 DIAGNOSIS — G4730 Sleep apnea, unspecified: Secondary | ICD-10-CM | POA: Diagnosis not present

## 2014-10-04 LAB — BASIC METABOLIC PANEL
Anion gap: 13 (ref 5–15)
BUN: 12 mg/dL (ref 6–23)
CHLORIDE: 100 meq/L (ref 96–112)
CO2: 28 mEq/L (ref 19–32)
Calcium: 8.9 mg/dL (ref 8.4–10.5)
Creatinine, Ser: 0.92 mg/dL (ref 0.50–1.35)
GFR calc Af Amer: >90 mL/min (ref >90)
GFR calc non Af Amer: >90 mL/min (ref >90)
Glucose, Bld: 93 mg/dL (ref 70–99)
POTASSIUM: 3.4 meq/L — AB (ref 3.7–5.3)
Sodium: 141 mEq/L (ref 137–147)

## 2014-10-04 NOTE — Progress Notes (Signed)
Airway assessed by Dr. Ivin Bootyrews ok for surgery here

## 2014-10-08 NOTE — H&P (Signed)
[name] 09/15/2014 11:39 AM Location: [facility] Patient #: [id number] DOB: 07/09/1976 Married / Language: English / Race: Black or African American Male  History of Present Illness [name]([name] Mooradian A. Magnus IvanBlackman MD; 09/15/2014 12:06 PM) Patient words: intial gallbladder.  The patient is a 38 year old male who presents for evaluation of gall stones. This patient is referred to me by [name] CoveyShane [name] PA for evaluation of symptomatically cholelithiasis. The patient initially presented in October with right upper quadrant abdominal pain, nausea, and vomiting after fatty meal. He has had several attacks of intermittent nausea or right upper quadrant abdominal pain since then. He is currently pain-free today. His first attack was quite severe sharp pain. He is otherwise healthy without complaints   Other Problems [name]([name] McDowell, LPN; 04/54/098111/13/2015 11:39 AM) Cholelithiasis High blood pressure Kidney Larina BrasStone  Past Surgical History [name]([name] McDowell, LPN; 19/14/782911/13/2015 11:39 AM) Oral Surgery  Diagnostic Studies History [name]([name] McDowell, LPN; 56/21/308611/13/2015 11:39 AM) Colonoscopy never  Social History [name]([name] McDowell, LPN; 57/84/696211/13/2015 11:39 AM) Alcohol use Occasional alcohol use. Caffeine use Carbonated beverages, Coffee, Tea. No drug use Tobacco use Never smoker.  Family History [name]([name] McDowell, LPN; 95/28/413211/13/2015 11:39 AM) Arthritis Mother. Diabetes Mellitus Father. Kidney Disease Father. Migraine Headache Sister. Thyroid problems Mother.  Review of Systems [name]([name] McDowell LPN; 44/01/027211/13/2015 11:39 AM) General Not Present- Appetite Loss, Chills, Fatigue, Fever, Night Sweats, Weight Gain and Weight Loss. Skin Not Present- Change in Wart/Mole, Dryness, Hives, Jaundice, New Lesions, Non-Healing Wounds, Rash and Ulcer. HEENT Present- Ringing in the Ears, Seasonal Allergies and Wears glasses/contact lenses. Not Present- Earache, Hearing Loss, Hoarseness, Nose Bleed, Oral Ulcers, Sinus  Pain, Sore Throat, Visual Disturbances and Yellow Eyes. Respiratory Present- Snoring and Wheezing. Not Present- Bloody sputum, Chronic Cough and Difficulty Breathing. Breast Not Present- Breast Mass, Breast Pain, Nipple Discharge and Skin Changes. Cardiovascular Not Present- Chest Pain, Difficulty Breathing Lying Down, Leg Cramps, Palpitations, Rapid Heart Rate, Shortness of Breath and Swelling of Extremities. Gastrointestinal Present- Abdominal Pain. Not Present- Bloating, Bloody Stool, Change in Bowel Habits, Chronic diarrhea, Constipation, Difficulty Swallowing, Excessive gas, Gets full quickly at meals, Hemorrhoids, Indigestion, Nausea, Rectal Pain and Vomiting. Male Genitourinary Present- Blood in Urine, Frequency and Nocturia. Not Present- Change in Urinary Stream, Impotence, Painful Urination, Urgency and Urine Leakage. Musculoskeletal Not Present- Back Pain, Joint Pain, Joint Stiffness, Muscle Pain, Muscle Weakness and Swelling of Extremities. Neurological Present- Tingling. Not Present- Decreased Memory, Fainting, Headaches, Numbness, Seizures, Tremor, Trouble walking and Weakness. Psychiatric Not Present- Anxiety, Bipolar, Change in Sleep Pattern, Depression, Fearful and Frequent crying. Endocrine Not Present- Cold Intolerance, Excessive Hunger, Hair Changes, Heat Intolerance, Hot flashes and New Diabetes. Hematology Not Present- Easy Bruising, Excessive bleeding, Gland problems, HIV and Persistent Infections.   Vitals [name]([name] McDowell LPN; 53/66/440311/13/2015 11:42 AM) 09/15/2014 11:41 AM Weight: 340.38 lb Height: 68in Body Surface Area: 2.72 m Body Mass Index: 51.75 kg/m Temp.: 98.38F(Temporal)  Pulse: 82 (Regular)  Resp.: 22 (Unlabored)  BP: 154/100 (Sitting, Left Arm, Standard)    Physical Exam ([name] A. Magnus IvanBlackman MD; 09/15/2014 12:06 PM) General Mental Status-Alert. General Appearance-Consistent with stated age. Hydration-Well hydrated. Voice-Normal.  Head  and Neck Head-normocephalic, atraumatic with no lesions or palpable masses.  Eye Eyeball - Bilateral-Extraocular movements intact. Sclera/Conjunctiva - Bilateral-No scleral icterus.  Chest and Lung Exam Chest and lung exam reveals -quiet, even and easy respiratory effort with no use of accessory muscles and on auscultation, normal breath sounds, no adventitious sounds and normal vocal resonance. Inspection Chest Wall - Normal. Back -  normal.  Cardiovascular Cardiovascular examination reveals -on palpation PMI is normal in location and amplitude, no palpable S3 or S4. Normal cardiac borders., normal heart sounds, regular rate and rhythm with no murmurs, carotid auscultation reveals no bruits and normal pedal pulses bilaterally.  Abdomen Inspection Inspection of the abdomen reveals - No Hernias. Skin - Scar - no surgical scars. Palpation/Percussion Palpation and Percussion of the abdomen reveal - Soft, Non Tender, No Rebound tenderness, No Rigidity (guarding) and No hepatosplenomegaly. Auscultation Auscultation of the abdomen reveals - Bowel sounds normal.  Neurologic Neurologic evaluation reveals -alert and oriented x 3 with no impairment of recent or remote memory. Mental Status-Normal.  Musculoskeletal Normal Exam - Left-Upper Extremity Strength Normal and Lower Extremity Strength Normal. Normal Exam - Right-Upper Extremity Strength Normal, Lower Extremity Weakness.    Assessment & Plan (Deserae Jennings A. Magnus IvanBlackman MD; 09/15/2014 12:07 PM) SYMPTOMATIC CHOLELITHIASIS (574.20  K80.20) Impression: Laparoscopic cholecystectomy with cholangiogram is recommended. I discussed this with him in detail and gave him literature regarding the surgery. I discussed the risk of surgery which includes but is not limited to bleeding, infection, bile duct injury, bile leak, the need to convert to an open procedure, cardiopulmonary issues, etc. I also discussed postoperative recovery. He  understands and wishes to proceed. Current Plans  Started Percocet 5-325MG , 1 (one) Tablet every four hours, as needed, #40, 09/15/2014, No Refill. Started Zofran 4MG , 1 (one) Tablet every six hours, as needed, #30, 09/15/2014, No Refill.

## 2014-10-09 ENCOUNTER — Ambulatory Visit (HOSPITAL_BASED_OUTPATIENT_CLINIC_OR_DEPARTMENT_OTHER)
Admission: RE | Admit: 2014-10-09 | Discharge: 2014-10-09 | Disposition: A | Discharge status: Home / Self Care | Payer: BC Managed Care – PPO | Source: Ambulatory Visit | Attending: Surgery | Admitting: Surgery

## 2014-10-09 ENCOUNTER — Encounter (HOSPITAL_BASED_OUTPATIENT_CLINIC_OR_DEPARTMENT_OTHER)
Admission: RE | Disposition: A | Discharge status: Home / Self Care | Payer: Self-pay | Source: Ambulatory Visit | Attending: Surgery

## 2014-10-09 ENCOUNTER — Ambulatory Visit (HOSPITAL_BASED_OUTPATIENT_CLINIC_OR_DEPARTMENT_OTHER): Payer: BC Managed Care – PPO | Admitting: Anesthesiology

## 2014-10-09 ENCOUNTER — Encounter (HOSPITAL_BASED_OUTPATIENT_CLINIC_OR_DEPARTMENT_OTHER): Payer: Self-pay | Admitting: Anesthesiology

## 2014-10-09 DIAGNOSIS — K8010 Calculus of gallbladder with chronic cholecystitis without obstruction: Secondary | ICD-10-CM | POA: Insufficient documentation

## 2014-10-09 DIAGNOSIS — G4730 Sleep apnea, unspecified: Secondary | ICD-10-CM | POA: Insufficient documentation

## 2014-10-09 DIAGNOSIS — Z87442 Personal history of urinary calculi: Secondary | ICD-10-CM | POA: Insufficient documentation

## 2014-10-09 DIAGNOSIS — I10 Essential (primary) hypertension: Secondary | ICD-10-CM | POA: Insufficient documentation

## 2014-10-09 HISTORY — PX: CHOLECYSTECTOMY: SHX55

## 2014-10-09 HISTORY — DX: Type 2 diabetes mellitus without complications: E11.9

## 2014-10-09 HISTORY — DX: Sleep apnea, unspecified: G47.30

## 2014-10-09 HISTORY — DX: Nasal congestion: R09.81

## 2014-10-09 LAB — POCT HEMOGLOBIN-HEMACUE: Hemoglobin: 13.1 g/dL (ref 13.0–17.0)

## 2014-10-09 LAB — GLUCOSE, CAPILLARY
GLUCOSE-CAPILLARY: 101 mg/dL — AB (ref 70–99)
Glucose-Capillary: 210 mg/dL — ABNORMAL HIGH (ref 70–99)

## 2014-10-09 SURGERY — LAPAROSCOPIC CHOLECYSTECTOMY
Anesthesia: General | Site: Abdomen

## 2014-10-09 MED ORDER — BUPIVACAINE-EPINEPHRINE (PF) 0.5% -1:200000 IJ SOLN
INTRAMUSCULAR | Status: AC
  Filled 2014-10-09: qty 30

## 2014-10-09 MED ORDER — DEXAMETHASONE SODIUM PHOSPHATE 4 MG/ML IJ SOLN
INTRAMUSCULAR | Status: DC | PRN
  Administered 2014-10-09: 10 mg via INTRAVENOUS

## 2014-10-09 MED ORDER — MIDAZOLAM HCL 2 MG/2ML IJ SOLN: 1.0000 mg | INTRAMUSCULAR | Status: DC | PRN

## 2014-10-09 MED ORDER — HYDROCODONE-ACETAMINOPHEN 5-325 MG PO TABS: 1.0000 | ORAL_TABLET | ORAL | Status: DC | PRN

## 2014-10-09 MED ORDER — MIDAZOLAM HCL 5 MG/5ML IJ SOLN
INTRAMUSCULAR | Status: DC | PRN
  Administered 2014-10-09: 2 mg via INTRAVENOUS

## 2014-10-09 MED ORDER — HYDROMORPHONE HCL 1 MG/ML IJ SOLN
0.2500 mg | INTRAMUSCULAR | Status: DC | PRN
  Administered 2014-10-09: 0.5 mg via INTRAVENOUS
  Administered 2014-10-09: 0.25 mg via INTRAVENOUS
  Administered 2014-10-09 (×2): 0.5 mg via INTRAVENOUS
  Administered 2014-10-09: 0.25 mg via INTRAVENOUS

## 2014-10-09 MED ORDER — MORPHINE SULFATE 2 MG/ML IJ SOLN: 1.0000 mg | INTRAMUSCULAR | Status: DC | PRN

## 2014-10-09 MED ORDER — ONDANSETRON HCL 4 MG/2ML IJ SOLN
4.0000 mg | Freq: Once | INTRAMUSCULAR | Status: AC
  Administered 2014-10-09: 4 mg via INTRAVENOUS

## 2014-10-09 MED ORDER — BUPIVACAINE HCL (PF) 0.5 % IJ SOLN
INTRAMUSCULAR | Status: DC | PRN
  Administered 2014-10-09: 20 mL

## 2014-10-09 MED ORDER — MIDAZOLAM HCL 2 MG/ML PO SYRP: 12.0000 mg | ORAL_SOLUTION | Freq: Once | ORAL | Status: DC | PRN

## 2014-10-09 MED ORDER — ROCURONIUM BROMIDE 100 MG/10ML IV SOLN
INTRAVENOUS | Status: DC | PRN
  Administered 2014-10-09 (×2): 20 mg via INTRAVENOUS

## 2014-10-09 MED ORDER — FENTANYL CITRATE 0.05 MG/ML IJ SOLN: 50.0000 ug | INTRAMUSCULAR | Status: DC | PRN

## 2014-10-09 MED ORDER — FENTANYL CITRATE 0.05 MG/ML IJ SOLN
INTRAMUSCULAR | Status: DC | PRN
  Administered 2014-10-09: 50 ug via INTRAVENOUS
  Administered 2014-10-09: 25 ug via INTRAVENOUS
  Administered 2014-10-09 (×2): 50 ug via INTRAVENOUS
  Administered 2014-10-09: 25 ug via INTRAVENOUS
  Administered 2014-10-09 (×2): 50 ug via INTRAVENOUS

## 2014-10-09 MED ORDER — ACETAMINOPHEN 325 MG PO TABS: 650.0000 mg | ORAL_TABLET | ORAL | Status: DC | PRN

## 2014-10-09 MED ORDER — PROPOFOL 10 MG/ML IV BOLUS
INTRAVENOUS | Status: AC
  Filled 2014-10-09: qty 20

## 2014-10-09 MED ORDER — HYDROMORPHONE HCL 1 MG/ML IJ SOLN
INTRAMUSCULAR | Status: AC
  Filled 2014-10-09: qty 1

## 2014-10-09 MED ORDER — OXYCODONE HCL 5 MG PO TABS
5.0000 mg | ORAL_TABLET | ORAL | Status: DC | PRN
  Administered 2014-10-09: 5 mg via ORAL

## 2014-10-09 MED ORDER — FENTANYL CITRATE 0.05 MG/ML IJ SOLN
INTRAMUSCULAR | Status: AC
  Filled 2014-10-09: qty 8

## 2014-10-09 MED ORDER — SODIUM CHLORIDE 0.9 % IR SOLN
Status: DC | PRN
  Administered 2014-10-09: 1000 mL

## 2014-10-09 MED ORDER — LIDOCAINE HCL (CARDIAC) 10 MG/ML IV SOLN
INTRAVENOUS | Status: DC | PRN
  Administered 2014-10-09: 80 mg via INTRAVENOUS

## 2014-10-09 MED ORDER — PROPOFOL 10 MG/ML IV BOLUS
INTRAVENOUS | Status: DC | PRN
  Administered 2014-10-09 (×2): 20 mg via INTRAVENOUS
  Administered 2014-10-09: 200 mg via INTRAVENOUS
  Administered 2014-10-09: 10 mg via INTRAVENOUS

## 2014-10-09 MED ORDER — ONDANSETRON HCL 4 MG/2ML IJ SOLN
INTRAMUSCULAR | Status: AC
  Filled 2014-10-09: qty 2

## 2014-10-09 MED ORDER — MIDAZOLAM HCL 2 MG/2ML IJ SOLN
INTRAMUSCULAR | Status: AC
  Filled 2014-10-09: qty 2

## 2014-10-09 MED ORDER — CEFAZOLIN SODIUM 1-5 GM-% IV SOLN
INTRAVENOUS | Status: AC
  Filled 2014-10-09: qty 50

## 2014-10-09 MED ORDER — GLYCOPYRROLATE 0.2 MG/ML IJ SOLN
INTRAMUSCULAR | Status: DC | PRN
  Administered 2014-10-09: 0.4 mg via INTRAVENOUS

## 2014-10-09 MED ORDER — DEXTROSE 5 % IV SOLN
3.0000 g | INTRAVENOUS | Status: AC
  Administered 2014-10-09: 3 g via INTRAVENOUS

## 2014-10-09 MED ORDER — CEFAZOLIN SODIUM-DEXTROSE 2-3 GM-% IV SOLR
INTRAVENOUS | Status: AC
  Filled 2014-10-09: qty 50

## 2014-10-09 MED ORDER — LACTATED RINGERS IV SOLN
INTRAVENOUS | Status: DC
Start: 2014-10-09 — End: 2014-10-09
  Administered 2014-10-09 (×3): via INTRAVENOUS

## 2014-10-09 MED ORDER — SODIUM CHLORIDE 0.9 % IJ SOLN: 3.0000 mL | Freq: Two times a day (BID) | INTRAMUSCULAR | Status: DC

## 2014-10-09 MED ORDER — SODIUM CHLORIDE 0.9 % IJ SOLN: 3.0000 mL | INTRAMUSCULAR | Status: DC | PRN

## 2014-10-09 MED ORDER — NEOSTIGMINE METHYLSULFATE 10 MG/10ML IV SOLN
INTRAVENOUS | Status: DC | PRN
  Administered 2014-10-09: 3 mg via INTRAVENOUS

## 2014-10-09 MED ORDER — SUCCINYLCHOLINE CHLORIDE 20 MG/ML IJ SOLN
INTRAMUSCULAR | Status: DC | PRN
  Administered 2014-10-09: 120 mg via INTRAVENOUS

## 2014-10-09 MED ORDER — SODIUM CHLORIDE 0.9 % IV SOLN: 250.0000 mL | INTRAVENOUS | Status: DC | PRN

## 2014-10-09 MED ORDER — ACETAMINOPHEN 650 MG RE SUPP: 650.0000 mg | RECTAL | Status: DC | PRN

## 2014-10-09 MED ORDER — OXYCODONE HCL 5 MG PO TABS
ORAL_TABLET | ORAL | Status: AC
  Filled 2014-10-09: qty 1

## 2014-10-09 MED ORDER — ONDANSETRON HCL 4 MG/2ML IJ SOLN
INTRAMUSCULAR | Status: DC | PRN
  Administered 2014-10-09: 4 mg via INTRAVENOUS

## 2014-10-09 SURGICAL SUPPLY — 49 items
APL SKNCLS STERI-STRIP NONHPOA (GAUZE/BANDAGES/DRESSINGS) ×1
APPLIER CLIP 5 13 M/L LIGAMAX5 (MISCELLANEOUS) ×4
APR CLP MED LRG 5 ANG JAW (MISCELLANEOUS) ×1
BAG SPEC RTRVL LRG 6X4 10 (ENDOMECHANICALS) ×2
BANDAGE ADH SHEER 1  50/CT (GAUZE/BANDAGES/DRESSINGS) ×12 IMPLANT
BENZOIN TINCTURE PRP APPL 2/3 (GAUZE/BANDAGES/DRESSINGS) ×4 IMPLANT
BLADE CLIPPER SURG (BLADE) ×4 IMPLANT
CANISTER SUCT 3000ML (MISCELLANEOUS) ×4 IMPLANT
CHLORAPREP W/TINT 26ML (MISCELLANEOUS) ×4 IMPLANT
CLIP APPLIE 5 13 M/L LIGAMAX5 (MISCELLANEOUS) ×2 IMPLANT
CLOSURE WOUND 1/2 X4 (GAUZE/BANDAGES/DRESSINGS) ×1
COVER MAYO STAND STRL (DRAPES) IMPLANT
DECANTER SPIKE VIAL GLASS SM (MISCELLANEOUS) IMPLANT
DRAPE C-ARM 42X72 X-RAY (DRAPES) IMPLANT
ELECT REM PT RETURN 9FT ADLT (ELECTROSURGICAL) ×4
ELECTRODE REM PT RTRN 9FT ADLT (ELECTROSURGICAL) ×2 IMPLANT
FILTER SMOKE EVAC LAPAROSHD (FILTER) IMPLANT
GLOVE BIOGEL M 7.0 STRL (GLOVE) ×4 IMPLANT
GLOVE BIOGEL PI IND STRL 6.5 (GLOVE) ×2 IMPLANT
GLOVE BIOGEL PI IND STRL 7.5 (GLOVE) ×2 IMPLANT
GLOVE BIOGEL PI INDICATOR 6.5 (GLOVE) ×2
GLOVE BIOGEL PI INDICATOR 7.5 (GLOVE) ×2
GLOVE ECLIPSE 6.5 STRL STRAW (GLOVE) ×4 IMPLANT
GLOVE EXAM NITRILE EXT CUFF MD (GLOVE) ×4 IMPLANT
GLOVE SURG SIGNA 7.5 PF LTX (GLOVE) ×4 IMPLANT
GOWN STRL REUS W/ TWL LRG LVL3 (GOWN DISPOSABLE) ×4 IMPLANT
GOWN STRL REUS W/ TWL XL LVL3 (GOWN DISPOSABLE) ×2 IMPLANT
GOWN STRL REUS W/TWL LRG LVL3 (GOWN DISPOSABLE) ×6
GOWN STRL REUS W/TWL XL LVL3 (GOWN DISPOSABLE) ×3
HEMOSTAT SNOW SURGICEL 2X4 (HEMOSTASIS) ×8 IMPLANT
PACK BASIN DAY SURGERY FS (CUSTOM PROCEDURE TRAY) ×4 IMPLANT
POUCH SPECIMEN RETRIEVAL 10MM (ENDOMECHANICALS) ×4 IMPLANT
SCISSORS LAP 5X35 DISP (ENDOMECHANICALS) IMPLANT
SET CHOLANGIOGRAPH 5 50 .035 (SET/KITS/TRAYS/PACK) IMPLANT
SET IRRIG TUBING LAPAROSCOPIC (IRRIGATION / IRRIGATOR) ×4 IMPLANT
SLEEVE ENDOPATH XCEL 5M (ENDOMECHANICALS) ×8 IMPLANT
SLEEVE SCD COMPRESS KNEE MED (MISCELLANEOUS) ×4 IMPLANT
SPECIMEN JAR SMALL (MISCELLANEOUS) ×4 IMPLANT
STRIP CLOSURE SKIN 1/2X4 (GAUZE/BANDAGES/DRESSINGS) ×3 IMPLANT
SUT MON AB 4-0 PC3 18 (SUTURE) ×4 IMPLANT
SUT VICRYL 0 UR6 27IN ABS (SUTURE) IMPLANT
TOWEL OR 17X24 6PK STRL BLUE (TOWEL DISPOSABLE) ×4 IMPLANT
TRAY LAPAROSCOPIC (CUSTOM PROCEDURE TRAY) ×4 IMPLANT
TROCAR XCEL BLUNT TIP 100MML (ENDOMECHANICALS) ×4 IMPLANT
TROCAR XCEL NON-BLD 11X100MML (ENDOMECHANICALS) ×4 IMPLANT
TROCAR XCEL NON-BLD 5MMX100MML (ENDOMECHANICALS) ×4 IMPLANT
TUBE CONNECTING 20'X1/4 (TUBING) ×1
TUBE CONNECTING 20X1/4 (TUBING) ×3 IMPLANT
TUBING INSUFFLATION (TUBING) ×4 IMPLANT

## 2014-10-09 NOTE — Progress Notes (Signed)
Notified dr. Ivin Bootyrews of CBG of 210.  No furthur orders received.

## 2014-10-09 NOTE — Interval H&P Note (Signed)
History and Physical Interval Note:no change in H and P  10/09/2014 6:07 AM  [name]  has presented today for surgery, with the diagnosis of Cholelithiasis  The various methods of treatment have been discussed with the patient and family. After consideration of risks, benefits and other options for treatment, the patient has consented to  Procedure(s): LAPAROSCOPIC CHOLECYSTECTOMY WITH INTRAOPERATIVE CHOLANGIOGRAM (N/A) as a surgical intervention .  The patient's history has been reviewed, patient examined, no change in status, stable for surgery.  I have reviewed the patient's chart and labs.  Questions were answered to the patient's satisfaction.     Elazar Argabright A

## 2014-10-09 NOTE — Anesthesia Procedure Notes (Signed)
Procedure Name: Intubation Date/Time: 10/09/2014 7:37 AM Performed by: Gar GibbonKEETON, [name] Pre-anesthesia Checklist: Patient identified, Emergency Drugs available, Suction available and Patient being monitored Patient Re-evaluated:Patient Re-evaluated prior to inductionOxygen Delivery Method: Circle System Utilized Preoxygenation: Pre-oxygenation with 100% oxygen Intubation Type: IV induction, Rapid sequence and Cricoid Pressure applied Ventilation: Mask ventilation without difficulty Tube type: Oral Number of attempts: 1 Airway Equipment and Method: oral airway,  Patient positioned with wedge pillow,  Stylet and Video-laryngoscopy Placement Confirmation: ETT inserted through vocal cords under direct vision,  positive ETCO2 and breath sounds checked- equal and bilateral Secured at: 23 cm Tube secured with: Tape Dental Injury: Teeth and Oropharynx as per pre-operative assessment  Difficulty Due To: Difficulty was anticipated, Difficult Airway- due to large tongue, Difficult Airway- due to reduced neck mobility and Difficult Airway-  due to edematous airway Future Recommendations: Recommend- induction with short-acting agent, and alternative techniques readily available

## 2014-10-09 NOTE — Op Note (Signed)
LAPAROSCOPIC CHOLECYSTECTOMY  Procedure Note  Dyann Kiefdward B Higbie 10/09/2014   Pre-op Diagnosis: Cholelithiasis     Post-op Diagnosis: same  Procedure(s): LAPAROSCOPIC CHOLECYSTECTOMY  Surgeon(s): Abigail Miyamotoouglas Shea Kapur, MD  Anesthesia: General  Staff:  Circulator: Aleen SellsPatricia Joyce Campbell, RN Scrub Person: Vernie Ammonsonnie Jo Bouchillon, RN; Salley ScarletMary B Davidson, RN  Estimated Blood Loss: Minimal               Specimens: sent to path          [facility]ACK[facility],Neeta Storey A   Date: 10/09/2014  Time: 8:44 AM

## 2014-10-09 NOTE — Transfer of Care (Signed)
Immediate Anesthesia Transfer of Care Note  Patient: [name]  Procedure(s) Performed: Procedure(s): LAPAROSCOPIC CHOLECYSTECTOMY (N/A)  Patient Location: PACU  Anesthesia Type:General  Level of Consciousness: awake, oriented and patient cooperative  Airway & Oxygen Therapy: Patient Spontanous Breathing and Patient connected to face mask oxygen  Post-op Assessment: Report given to PACU RN and Post -op Vital signs reviewed and stable  Post vital signs: Reviewed and stable  Complications: No apparent anesthesia complications

## 2014-10-09 NOTE — Anesthesia Postprocedure Evaluation (Signed)
  Anesthesia Post-op Note  Patient: [name]  Procedure(s) Performed: Procedure(s): LAPAROSCOPIC CHOLECYSTECTOMY (N/A)  Patient Location: PACU  Anesthesia Type:General  Level of Consciousness: awake  Airway and Oxygen Therapy: Patient Spontanous Breathing  Post-op Pain: mild  Post-op Assessment: Post-op Vital signs reviewed  Post-op Vital Signs: Reviewed  Last Vitals:  Filed Vitals:   10/09/14 0855  BP:   Pulse: 101  Temp:   Resp: 19    Complications: No apparent anesthesia complications

## 2014-10-09 NOTE — Anesthesia Preprocedure Evaluation (Signed)
Anesthesia Evaluation  Patient identified by MRN, date of birth, ID band Patient awake    Reviewed: Allergy & Precautions, H&P , NPO status , Patient's Chart, lab work & pertinent test results  Airway Mallampati: II  TM Distance: >3 FB     Dental   Pulmonary sleep apnea ,  breath sounds clear to auscultation        Cardiovascular hypertension, Rhythm:Regular Rate:Normal     Neuro/Psych negative neurological ROS     GI/Hepatic negative GI ROS, Neg liver ROS,   Endo/Other  diabetes  Renal/GU negative Renal ROS     Musculoskeletal   Abdominal   Peds  Hematology   Anesthesia Other Findings   Reproductive/Obstetrics                             Anesthesia Physical Anesthesia Plan  ASA: III  Anesthesia Plan: General   Post-op Pain Management:    Induction: Intravenous  Airway Management Planned: Oral ETT  Additional Equipment:   Intra-op Plan:   Post-operative Plan: Possible Post-op intubation/ventilation  Informed Consent: I have reviewed the patients History and Physical, chart, labs and discussed the procedure including the risks, benefits and alternatives for the proposed anesthesia with the patient or authorized representative who has indicated his/her understanding and acceptance.   Dental advisory given  Plan Discussed with: CRNA and Anesthesiologist  Anesthesia Plan Comments:         Anesthesia Quick Evaluation

## 2014-10-09 NOTE — Discharge Instructions (Signed)
[facility] ______[facility] LAPAROSCOPIC SURGERY: POST OP INSTRUCTIONS Always review your discharge instruction sheet given to you by the facility where your surgery was performed. IF YOU HAVE DISABILITY OR FAMILY LEAVE FORMS, YOU MUST BRING THEM TO THE OFFICE FOR PROCESSING.   DO NOT GIVE THEM TO YOUR DOCTOR.  1. A prescription for pain medication may be given to you upon discharge.  Take your pain medication as prescribed, if needed.  If narcotic pain medicine is not needed, then you may take acetaminophen (Tylenol) or ibuprofen (Advil) as needed. 2. Take your usually prescribed medications unless otherwise directed. 3. If you need a refill on your pain medication, please contact your pharmacy.  They will contact our office to request authorization. Prescriptions will not be filled after 5pm or on week-ends. 4. You should follow a light diet the first few days after arrival home, such as soup and crackers, etc.  Be sure to include lots of fluids daily. 5. Most patients will experience some swelling and bruising in the area of the incisions.  Ice packs will help.  Swelling and bruising can take several days to resolve.  6. It is common to experience some constipation if taking pain medication after surgery.  Increasing fluid intake and taking a stool softener (such as Colace) will usually help or prevent this problem from occurring.  A mild laxative (Milk of Magnesia or Miralax) should be taken according to package instructions if there are no bowel movements after 48 hours. 7. Unless discharge instructions indicate otherwise, you may remove your bandages 24-48 hours after surgery, and you may shower at that time.  You may have steri-strips (small skin tapes) in place directly over the incision.  These strips should be left on the skin for 7-10 days.  If your surgeon used skin glue on the incision, you may shower in 24 hours.  The glue will flake off over the next 2-3 weeks.  Any sutures or  staples will be removed at the office during your follow-up visit. 8. ACTIVITIES:  You may resume regular (light) daily activities beginning the next day--such as daily self-care, walking, climbing stairs--gradually increasing activities as tolerated.  You may have sexual intercourse when it is comfortable.  Refrain from any heavy lifting or straining until approved by your doctor. a. You may drive when you are no longer taking prescription pain medication, you can comfortably wear a seatbelt, and you can safely maneuver your car and apply brakes. b. RETURN TO WORK:  __________________________________________________________ 9. You should see your doctor in the office for a follow-up appointment approximately 2-3 weeks after your surgery.  Make sure that you call for this appointment within a day or two after you arrive home to insure a convenient appointment time. 10. OTHER INSTRUCTIONS: __NO LIFTING MORE THAN 15 POUNDS FOR 2 WEEKS. 11. ICE PACK AND IBUPROFEN ALSO FOR PAIN. 12. ________________________________________________________________________________________________________________________ __________________________________________________________________________________________________________________________ WHEN TO CALL YOUR DOCTOR: 1. Fever over 101.0 2. Inability to urinate 3. Continued bleeding from incision. 4. Increased pain, redness, or drainage from the incision. 5. Increasing abdominal pain  The clinic staff is available to answer your questions during regular business hours.  Please dont hesitate to call and ask to speak to one of the nurses for clinical concerns.  If you have a medical emergency, go to the nearest emergency room or call 911.  A surgeon from [facility] is always on call at the hospital. [address], [address], [place], [place]  [id number] ?  [id number], [place], [place]   [address] [phone] ? [phone] ? FAX [phone] Web site:  [email or web address]   Post Anesthesia Home Care Instructions  Activity: Get plenty of rest for the remainder of the day. A responsible adult should stay with you for 24 hours following the procedure.  For the next 24 hours, DO NOT: -Drive a car -Paediatric nurse -Drink alcoholic beverages -Take any medication unless instructed by your physician -Make any legal decisions or sign important papers.  Meals: Start with liquid foods such as gelatin or soup. Progress to regular foods as tolerated. Avoid greasy, spicy, heavy foods. If nausea and/or vomiting occur, drink only clear liquids until the nausea and/or vomiting subsides. Call your physician if vomiting continues.  Special Instructions/Symptoms: Your throat may feel dry or sore from the anesthesia or the breathing tube placed in your throat during surgery. If this causes discomfort, gargle with warm salt water. The discomfort should disappear within 24 hours.

## 2014-10-10 ENCOUNTER — Encounter (HOSPITAL_BASED_OUTPATIENT_CLINIC_OR_DEPARTMENT_OTHER): Payer: Self-pay | Admitting: Surgery

## 2014-10-10 NOTE — Op Note (Signed)
[name]:  [name], [name]                 ACCOUNT NO.:  [id number]  MEDICAL RECORD NO.:  [id number]  LOCATION:                                 FACILITY:  PHYSICIAN:  Abigail Miyamotoouglas Rayshawn Maney, M.D. DATE OF BIRTH:  31-Aug-1976  DATE OF PROCEDURE:  10/09/2014 DATE OF DISCHARGE:  10/09/2014                              OPERATIVE REPORT   PREOPERATIVE DIAGNOSIS:  Symptomatic cholelithiasis.  POSTOPERATIVE DIAGNOSIS:  Symptomatic cholelithiasis.  PROCEDURE:  Laparoscopic cholecystectomy.  SURGEON:  Abigail Miyamotoouglas Shaleah Nissley, M.D.  ANESTHESIA:  General.  0.5% Marcaine.  ESTIMATED BLOOD LOSS:  Minimal.  INDICATIONS:  A 38 year old gentleman who presents with symptomatic cholelithiasis.  Decision was made to proceed with laparoscopic cholecystectomy.  He is morbidly obese.  FINDINGS:  The patient was found to have an extremely fatty liver.  The gallbladder was chronically inflamed and partly intrahepatic in nature. It was difficult to excise.  PROCEDURE IN DETAIL:  The patient was brought to the operating room, identified as [name]  He was placed supine on the operating room table and general anesthesia was induced.  His abdomen was then prepped and draped in usual sterile fashion.  Using a scalpel, made a vertical incision above the umbilicus.  I carried this down to the fascia which was then opened with a scalpel.  A hemostat was then used to pass to the peritoneal cavity under direct vision.  A 0 Vicryl pursestring suture was then placed around the fascial opening.  The Hasson port was placed through the opening and insufflation of the abdomen was begun.  The 5 mm ports were then placed in the patient's epigastrium and 2 more in the right upper quadrant under direct vision.  The patient was found to have a fatty liver.  The gallbladder was quite contracted and thick-walled. It was embedded in the liver.  I was able to grasp and elevate it.  The cystic duct and cystic artery itself were actually  easy to dissect out and a critical window was achieved around both.  I clipped the cystic duct 3 times proximally, once distally, and transected it.  I then clipped the artery proximally and distally and transected as well.  The gallbladder was then slowly dissected out the liver bed with electrocautery.  Again, this was quite difficult as the gallbladder was intrahepatic.  A large number of very tiny stones leaked out of the gallbladder.  I was finally able to free the gallbladder out of the liver bed with difficulty.  I then placed an endosac with several large stones and removed it through the incision at the umbilicus.  I then had to upgrade the 5-mm port in the epigastrium to an 11-mm port.  I then used a large sucker to aspirate all the tiny stones out of the right upper quadrant.  In order to achieve hemostasis in the liver bed after digging the gallbladder out of the liver bed, I was able to achieve it with the cautery and several pieces of surgical snow.  I again thoroughly irrigated the abdomen with normal saline.  I again evaluated the liver bed and hemostasis appeared to be achieved.  All ports were removed under direct  vision, the abdomen was deflated.  All incisions were then anesthetized with Marcaine and closed with 4-0 Monocryl subcuticular sutures.  Steri-Strips and Band-Aids were then applied.  The patient tolerated the procedure well.  All the counts were correct at the end of procedure.  The patient was then extubated in the operating room and taken in a stable condition to recovery room.     Abigail Miyamotoouglas Sidney Silberman, M.D.     DB/MEDQ  D:  10/09/2014  T:  10/09/2014  Job:  [id number]

## 2014-10-11 ENCOUNTER — Encounter (HOSPITAL_BASED_OUTPATIENT_CLINIC_OR_DEPARTMENT_OTHER): Payer: Self-pay | Admitting: Surgery

## 2014-12-26 ENCOUNTER — Telehealth: Payer: Self-pay | Admitting: Medical

## 2014-12-26 NOTE — Telephone Encounter (Signed)
Pt says that insurance requires a 90day script on meds and that he uses mail order pharmacy through his job. Requesting 90day handwritten scrips on Amlodipine, Benazepril, Metformin, Potassium so he can give to mail order pharmacy at work. Call pt at [phone] when scripts ready for pick up

## 2014-12-26 NOTE — Telephone Encounter (Signed)
Can go ahead and send 90 day but get him in for diabetes f/u visit

## 2014-12-27 ENCOUNTER — Other Ambulatory Visit: Payer: Self-pay | Admitting: Family Medicine

## 2014-12-27 MED ORDER — POTASSIUM CHLORIDE CRYS ER 20 MEQ PO TBCR: 20.0000 meq | EXTENDED_RELEASE_TABLET | Freq: Two times a day (BID) | ORAL | Status: DC

## 2014-12-27 MED ORDER — AMLODIPINE BESYLATE 5 MG PO TABS: 5.0000 mg | ORAL_TABLET | Freq: Every day | ORAL | Status: DC

## 2014-12-27 MED ORDER — BENAZEPRIL-HYDROCHLOROTHIAZIDE 20-12.5 MG PO TABS: 2.0000 | ORAL_TABLET | Freq: Every day | ORAL | Status: DC

## 2014-12-27 MED ORDER — METFORMIN HCL 500 MG PO TABS: 500.0000 mg | ORAL_TABLET | Freq: Two times a day (BID) | ORAL | Status: DC

## 2014-12-27 NOTE — Telephone Encounter (Signed)
Patient is aware that RX are ready for pick up

## 2015-06-21 ENCOUNTER — Other Ambulatory Visit: Payer: Self-pay | Admitting: Medical

## 2015-06-21 ENCOUNTER — Telehealth: Payer: Self-pay | Admitting: Medical

## 2015-06-21 MED ORDER — BENAZEPRIL-HYDROCHLOROTHIAZIDE 20-12.5 MG PO TABS: 2.0000 | ORAL_TABLET | Freq: Every day | ORAL | Status: DC

## 2015-06-21 MED ORDER — AMLODIPINE BESYLATE 5 MG PO TABS: 5.0000 mg | ORAL_TABLET | Freq: Every day | ORAL | Status: DC

## 2015-06-21 NOTE — Telephone Encounter (Signed)
He is due for med check, fasting labs, so I'll send 30 days, but he needs to come in

## 2015-06-21 NOTE — Telephone Encounter (Signed)
Tammy handled

## 2015-06-21 NOTE — Telephone Encounter (Signed)
Pt needs RFs Amlodipine & Benazepril/HCTZ needs 30 days for local pharmacy because he is out & also wants 90 days and he will mail into mail order.  He wants you to print both of them out and he will take them to the pharmacy & mail

## 2015-07-05 ENCOUNTER — Ambulatory Visit (INDEPENDENT_AMBULATORY_CARE_PROVIDER_SITE_OTHER): Payer: BLUE CROSS/BLUE SHIELD | Admitting: Medical

## 2015-07-05 ENCOUNTER — Telehealth: Payer: Self-pay | Admitting: Medical

## 2015-07-05 ENCOUNTER — Encounter: Payer: Self-pay | Admitting: Medical

## 2015-07-05 VITALS — BP 138/92 | HR 80 | Temp 98.0°F | Resp 18 | Ht 68.0 in | Wt 330.0 lb

## 2015-07-05 DIAGNOSIS — I10 Essential (primary) hypertension: Secondary | ICD-10-CM

## 2015-07-05 DIAGNOSIS — Z23 Encounter for immunization: Secondary | ICD-10-CM

## 2015-07-05 DIAGNOSIS — G4733 Obstructive sleep apnea (adult) (pediatric): Secondary | ICD-10-CM

## 2015-07-05 DIAGNOSIS — Z7189 Other specified counseling: Secondary | ICD-10-CM

## 2015-07-05 DIAGNOSIS — T887XXA Unspecified adverse effect of drug or medicament, initial encounter: Secondary | ICD-10-CM

## 2015-07-05 DIAGNOSIS — Z9989 Dependence on other enabling machines and devices: Secondary | ICD-10-CM

## 2015-07-05 DIAGNOSIS — Z91199 Patient's noncompliance with other medical treatment and regimen due to unspecified reason: Secondary | ICD-10-CM | POA: Insufficient documentation

## 2015-07-05 DIAGNOSIS — Z9119 Patient's noncompliance with other medical treatment and regimen: Secondary | ICD-10-CM

## 2015-07-05 DIAGNOSIS — E119 Type 2 diabetes mellitus without complications: Secondary | ICD-10-CM

## 2015-07-05 DIAGNOSIS — Z9049 Acquired absence of other specified parts of digestive tract: Secondary | ICD-10-CM | POA: Diagnosis not present

## 2015-07-05 DIAGNOSIS — E1169 Type 2 diabetes mellitus with other specified complication: Secondary | ICD-10-CM

## 2015-07-05 DIAGNOSIS — Z2821 Immunization not carried out because of patient refusal: Secondary | ICD-10-CM | POA: Insufficient documentation

## 2015-07-05 DIAGNOSIS — Z7185 Encounter for immunization safety counseling: Secondary | ICD-10-CM

## 2015-07-05 DIAGNOSIS — Z2820 Immunization not carried out because of patient decision for unspecified reason: Secondary | ICD-10-CM

## 2015-07-05 DIAGNOSIS — E669 Obesity, unspecified: Secondary | ICD-10-CM | POA: Insufficient documentation

## 2015-07-05 DIAGNOSIS — Z0000 Encounter for general adult medical examination without abnormal findings: Secondary | ICD-10-CM | POA: Diagnosis not present

## 2015-07-05 DIAGNOSIS — T50905A Adverse effect of unspecified drugs, medicaments and biological substances, initial encounter: Secondary | ICD-10-CM

## 2015-07-05 LAB — COMPREHENSIVE METABOLIC PANEL
ALT: 24 U/L (ref 9–46)
AST: 16 U/L (ref 10–40)
Albumin: 4.4 g/dL (ref 3.6–5.1)
Alkaline Phosphatase: 51 U/L (ref 40–115)
BUN: 16 mg/dL (ref 7–25)
CHLORIDE: 102 mmol/L (ref 98–110)
CO2: 29 mmol/L (ref 20–31)
CREATININE: 1.05 mg/dL (ref 0.60–1.35)
Calcium: 9.2 mg/dL (ref 8.6–10.3)
Glucose, Bld: 115 mg/dL — ABNORMAL HIGH (ref 65–99)
Potassium: 3.1 mmol/L — ABNORMAL LOW (ref 3.5–5.3)
SODIUM: 142 mmol/L (ref 135–146)
TOTAL PROTEIN: 6.9 g/dL (ref 6.1–8.1)
Total Bilirubin: 0.9 mg/dL (ref 0.2–1.2)

## 2015-07-05 LAB — CBC
HCT: 39.7 % (ref 39.0–52.0)
Hemoglobin: 14 g/dL (ref 13.0–17.0)
MCH: 29.8 pg (ref 26.0–34.0)
MCHC: 35.3 g/dL (ref 30.0–36.0)
MCV: 84.5 fL (ref 78.0–100.0)
MPV: 10.5 fL (ref 8.6–12.4)
Platelets: 164 10*3/uL (ref 150–400)
RBC: 4.7 MIL/uL (ref 4.22–5.81)
RDW: 15 % (ref 11.5–15.5)
WBC: 4.5 10*3/uL (ref 4.0–10.5)

## 2015-07-05 LAB — LIPID PANEL
CHOLESTEROL: 136 mg/dL (ref 125–200)
HDL: 34 mg/dL — ABNORMAL LOW (ref >=40)
LDL Cholesterol: 83 mg/dL (ref <130)
TRIGLYCERIDES: 95 mg/dL (ref <150)
Total CHOL/HDL Ratio: 4 Ratio (ref <=5.0)
VLDL: 19 mg/dL (ref <30)

## 2015-07-05 LAB — HEMOGLOBIN A1C
Hgb A1c MFr Bld: 5.9 % — ABNORMAL HIGH (ref <5.7)
Mean Plasma Glucose: 123 mg/dL — ABNORMAL HIGH (ref <117)

## 2015-07-05 LAB — TSH: TSH: 1.172 u[IU]/mL (ref 0.350–4.500)

## 2015-07-05 NOTE — Telephone Encounter (Signed)
Faxed order to [facility] [phone] for repeat sleep study to eval effectiveness of CPAP per Vincenza Hews.

## 2015-07-05 NOTE — Addendum Note (Signed)
Addended by: Lilli Light on: 07/05/2015 10:28 AM   Modules accepted: Orders

## 2015-07-05 NOTE — Telephone Encounter (Signed)
Refer for updated SNAP sleep study.    Is on CPAP, this is a study to check effectiveness of CPAP

## 2015-07-05 NOTE — Addendum Note (Signed)
Addended by: Lilli Light on: 07/05/2015 10:30 AM   Modules accepted: Orders

## 2015-07-05 NOTE — Progress Notes (Signed)
Subjective:   HPI  [name] is a 39 y.o. male who presents for a complete physical.  Medical care team/other doctors includes: Ernst Breach, PA-C here for primary care   Preventative care:here 2015 Last physical or labs: 2015 Sees dentist yearly: yes 2016 Dr Aileen Fass Last tetanus vaccine, TD or Tdap: thinks its been within 10 years but unsure  Concerns: Diet - avoiding salt.   Eats what he wants, no diet discretion.   Having CPAP issues.  Feels like its not working.  Home health came out but said machine working fine.  They recommended he have updated study  [name] is an 39 y.o. male who presents for follow up of Type 2 diabetes mellitus.   Patient is not checking home blood sugars.  He does have a glucometer Current symptoms include: none.   Patient is checking their feet daily. Foot concerns (callous, ulcer, wound, thickened nails, toenail fungus, skin fungus, hammer toe): none Last dilated eye exam within the last year.  Current treatments: none. Stopped metformin due to belly upset Medication compliance: poor  Current diet: in general, an "unhealthy" diet Current exercise: walking Known diabetic complications: none   Hypertension - Here for follow-up of hypertension.  He is exercising some with walking, not very regularly.  He is adherent to a low-salt diet.  Blood pressure is not well controlled at home.  SBP usually ranges 180.   DBP usually ranges 90.  Cardiac symptoms: none. Patient denies: chest pain, claudication, dyspnea and fatigue. Cardiovascular risk factors: diabetes mellitus, hypertension, male gender and obesity (BMI >= 30 kg/m2). Use of agents associated with hypertension: none. History of target organ damage: none.   Obesity - exercising some, no diet discretion.  Reviewed their medical, surgical, family, social, medication, and allergy history and updated chart as appropriate.  Past Medical History  Diagnosis Date  . Obesity   .  Non-insulin dependent type 2 diabetes mellitus   . Sleep apnea     uses CPAP nightly  . Nasal congestion 10/03/2014  . Hypertension   . Wears contact lenses     Past Surgical History  Procedure Laterality Date  . Cholecystectomy N/A 10/09/2014    Procedure: LAPAROSCOPIC CHOLECYSTECTOMY;  Surgeon: [name], MD;  Location: [facility];  Service: General;  Laterality: N/A;    Social History   Social History  . Marital Status: Married    Spouse Name: N/A  . Number of Children: N/A  . Years of Education: N/A   Occupational History  . Not on file.   Social History Main Topics  . Smoking status: Never Smoker   . Smokeless tobacco: Never Used  . Alcohol Use: Yes     Comment: occasional  . Drug Use: No  . Sexual Activity: Not on file   Other Topics Concern  . Not on file   Social History Narrative   Married, has 9yo boy.  Wife has 2 children, so 3 in the household.  Exercise some with walking.  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          Family History  Problem Relation Age of Onset  . Hypertension Mother   . Kidney disease Father   . Diabetes Maternal Aunt   . Diabetes Paternal Aunt   . Cancer Maternal Grandfather     throat  . Stroke Paternal Grandmother   . Alcohol abuse Paternal Grandmother   . Heart disease Neg Hx  Current outpatient prescriptions:  .  amLODipine (NORVASC) 5 MG tablet, Take 1 tablet (5 mg total) by mouth daily., Disp: 30 tablet, Rfl: 0 .  benazepril-hydrochlorthiazide (LOTENSIN HCT) 20-12.5 MG per tablet, Take 2 tablets by mouth daily., Disp: 60 tablet, Rfl: 0 .  potassium chloride SA (K-DUR,KLOR-CON) 20 MEQ tablet, Take 1 tablet (20 mEq total) by mouth 2 (two) times daily., Disp: 180 tablet, Rfl: 0 .  HYDROcodone-acetaminophen (NORCO) 5-325 MG per tablet, Take 1-2 tablets by mouth every 4 (four) hours as needed. (Patient not taking: Reported on 07/05/2015), Disp: 40 tablet, Rfl: 0 .  metFORMIN (GLUCOPHAGE) 500  MG tablet, Take 1 tablet (500 mg total) by mouth 2 (two) times daily with a meal. 1 tablet po BID for diabetes (Patient not taking: Reported on 07/05/2015), Disp: 180 tablet, Rfl: 0  No Known Allergies  Review of Systems Constitutional: -fever, -chills, -sweats, -unexpected weight change, -decreased appetite, -fatigue Allergy: -sneezing, -itching, -congestion Dermatology: -changing moles, --rash, -lumps ENT: -runny nose, -ear pain, -sore throat, -hoarseness, -sinus pain, -teeth pain, - ringing in ears, -hearing loss, -nosebleeds Cardiology: -chest pain, -palpitations, -swelling, -difficulty breathing when lying flat, -waking up short of breath Respiratory: -cough, -shortness of breath, -difficulty breathing with exercise or exertion, -wheezing, -coughing up blood Gastroenterology: -abdominal pain, -nausea, -vomiting, -diarrhea, -constipation, -blood in stool, -changes in bowel movement, -difficulty swallowing or eating Hematology: -bleeding, -bruising  Musculoskeletal: -joint aches, -muscle aches, -joint swelling, -back pain, -neck pain, -cramping, -changes in gait Ophthalmology: denies vision changes, eye redness, itching, discharge Urology: -burning with urination, -difficulty urinating, -blood in urine, -urinary frequency, -urgency, -incontinence Neurology: -headache, -weakness, -tingling, -numbness, -memory loss, -falls, -dizziness Psychology: -depressed mood, -agitation, -sleep problems     Objective:   Physical Exam  BP 138/92 mmHg  Pulse 80  Temp(Src) 98 F (36.7 C) (Oral)  Resp 18  Ht 5\' 8"  (1.727 m)  Wt 330 lb (149.687 kg)  BMI 50.19 kg/m2  Wt Readings from Last 3 Encounters:  07/05/15 330 lb (149.687 kg)  10/09/14 341 lb (154.677 kg)  09/11/14 325 lb (147.419 kg)   BP Readings from Last 3 Encounters:  07/05/15 138/92  10/09/14 168/100  08/31/14 150/94   General appearance: alert, no distress, WD/WN, morbidly obese AA male Skin: few scatterd macules, no worrisome  lesions HEENT: normocephalic, conjunctiva/corneas normal, sclerae anicteric, PERRLA, EOMi, nares patent, no discharge or erythema, pharynx normal Oral cavity: MMM, tongue with patchy red/brown coloration left distal tongue unchanged for years per patient, otherwise normal, teeth in good repair Neck: supple, no lymphadenopathy, no thyromegaly, no masses, normal ROM, no bruits Chest: non tender, normal shape and expansion Heart: RRR, normal S1, S2, no murmurs Lungs: CTA bilaterally, no wheezes, rhonchi, or rales Abdomen: +bs, soft, several port surgical scars, otherwise non tender, non distended, no masses, no hepatomegaly, no splenomegaly, no bruits Back: non tender, normal ROM, no scoliosis Musculoskeletal: upper extremities non tender, no obvious deformity, normal ROM throughout, lower extremities non tender, no obvious deformity, normal ROM throughout Extremities: no edema, no cyanosis, no clubbing Pulses: 2+ symmetric, upper and lower extremities, normal cap refill Neurological: alert, oriented x 3, CN2-12 intact, strength normal upper extremities and lower extremities, sensation normal throughout, DTRs 2+ throughout, no cerebellar signs, gait normal Psychiatric: normal affect, behavior normal, pleasant  GU: normal male external genitalia, circumcised, nontender, no masses, no hernia, no lymphadenopathy Rectal: deferred due to age 72yo and no indication today   Assessment and Plan :    Encounter Diagnoses  Name Primary?  [name]  Encounter for health maintenance examination in adult Yes  . Diabetes mellitus type 2 in obese   . Essential hypertension   . OSA on CPAP   . S/P cholecystectomy   . Need for prophylactic vaccination and inoculation against influenza   . Vaccine counseling   . Vaccine refused by patient   . Adverse effects of medication, initial encounter   . Noncompliance     Physical exam - discussed healthy lifestyle, diet, exercise, preventative care, vaccinations, and  addressed their concerns.   See your dentist yearly for routine dental care including hygiene visits twice yearly. See your eye doctor yearly for routine vision care. Routine labs today.  Vaccinations: Counseled on the influenza virus vaccine.  Vaccine information sheet given.  Influenza vaccine given after consent obtained. Counseled on pneumococcal, Hep B and Tdap today but he declines all 3.   Other concerns today: DM type 2 - discussed need to have diet discretion, to have routine f/u, to be compliant with medications.   Labs today.  Adverse effect of medication - didn't tolerate generic metformin.  HTN - not at goal.   Increase to Amlodipine  daily.  C/t benazepril HCT 20/12.5mg  daily, add atenolol  daily  Obesity - need to work on lifestyle changes  OSA - will send for updated sleep study.  Noncompliance - discussed need to take a more active role in his health, to let us know also if not tolerating medication.  Since last visit is s/p cholecystectomy  Follow up pending labs

## 2015-07-06 ENCOUNTER — Other Ambulatory Visit: Payer: Self-pay | Admitting: Medical

## 2015-07-06 LAB — MICROALBUMIN / CREATININE URINE RATIO
CREATININE, URINE: 90.2 mg/dL
MICROALB UR: 0.6 mg/dL (ref <2.0)
Microalb Creat Ratio: 6.7 mg/g (ref 0.0–30.0)

## 2015-07-06 MED ORDER — PRAVASTATIN SODIUM 20 MG PO TABS: 20.0000 mg | ORAL_TABLET | Freq: Every day | ORAL | Status: DC

## 2015-07-06 MED ORDER — ATENOLOL 50 MG PO TABS: 50.0000 mg | ORAL_TABLET | Freq: Every day | ORAL | Status: DC

## 2015-07-06 MED ORDER — BENAZEPRIL-HYDROCHLOROTHIAZIDE 20-12.5 MG PO TABS: 2.0000 | ORAL_TABLET | Freq: Every day | ORAL | Status: DC

## 2015-07-06 MED ORDER — EMPAGLIFLOZIN-LINAGLIPTIN 10-5 MG PO TABS: 1.0000 | ORAL_TABLET | Freq: Every day | ORAL | Status: DC

## 2015-07-06 MED ORDER — AMLODIPINE BESYLATE 10 MG PO TABS: 10.0000 mg | ORAL_TABLET | Freq: Every day | ORAL | Status: DC

## 2015-07-06 MED ORDER — POTASSIUM CHLORIDE CRYS ER 20 MEQ PO TBCR: 20.0000 meq | EXTENDED_RELEASE_TABLET | Freq: Two times a day (BID) | ORAL | Status: DC

## 2015-07-10 ENCOUNTER — Telehealth: Payer: Self-pay

## 2015-07-10 NOTE — Telephone Encounter (Signed)
Called patient with results note, ask him to write them down.  He is requesting a call from [place] [phone]

## 2015-07-10 NOTE — Telephone Encounter (Signed)
Mail him copy of AV summary or list of recommendations from the lab note.  Call back and see what his questions are?

## 2015-07-13 ENCOUNTER — Telehealth: Payer: Self-pay

## 2015-07-13 NOTE — Telephone Encounter (Signed)
Pt.notified

## 2015-07-13 NOTE — Telephone Encounter (Signed)
Looking back, his A1C labs showed diagnosis of diabetes, not pre-diabetic, a year ago.  So he is in fact diabetic.   His most recent labs regarding A1C were improved somewhat, but he is diabetic.   Recommendations for all diabetic is to keep HgbA1C under 7, keep LDL under 70, to use a statin cholesterol medication as part of their heart disease risk reductions, and to use ACE inhibitors like Lisinopril to protect the kidneys.  Thus, he is on appropriate recommendations.  I hope this clarifies it for him, but these measures are meant to protect against heart disease risks and damage to the kidney, as diabetes can lead to those complications.

## 2015-07-13 NOTE — Telephone Encounter (Signed)
Pt called wanting to know why he was put on cholesterol medications. Also, he wants clarification as to why he is listed as a diabetic. He said he was told he was borderline and not actually diabetic.

## 2016-02-17 ENCOUNTER — Other Ambulatory Visit: Payer: Self-pay | Admitting: Medical

## 2016-02-19 ENCOUNTER — Ambulatory Visit (HOSPITAL_COMMUNITY)
Admission: EM | Admit: 2016-02-19 | Discharge: 2016-02-19 | Disposition: A | Discharge status: Home / Self Care | Payer: Managed Care, Other (non HMO) | Attending: Family Medicine | Admitting: Family Medicine

## 2016-02-19 ENCOUNTER — Encounter (HOSPITAL_COMMUNITY): Payer: Self-pay | Admitting: Emergency Medicine

## 2016-02-19 DIAGNOSIS — M545 Low back pain, unspecified: Secondary | ICD-10-CM

## 2016-02-19 DIAGNOSIS — T148 Other injury of unspecified body region: Secondary | ICD-10-CM

## 2016-02-19 DIAGNOSIS — E119 Type 2 diabetes mellitus without complications: Secondary | ICD-10-CM

## 2016-02-19 DIAGNOSIS — T148XXA Other injury of unspecified body region, initial encounter: Secondary | ICD-10-CM

## 2016-02-19 MED ORDER — DICLOFENAC POTASSIUM 50 MG PO TABS: 50.0000 mg | ORAL_TABLET | Freq: Three times a day (TID) | ORAL | Status: DC

## 2016-02-19 NOTE — Discharge Instructions (Signed)
Back Pain, Adult Back pain is very common in adults.The cause of back pain is rarely dangerous and the pain often gets better over time.The cause of your back pain may not be known. Some common causes of back pain include:  Strain of the muscles or ligaments supporting the spine.  Wear and tear (degeneration) of the spinal disks.  Arthritis.  Direct injury to the back. For many people, back pain may return. Since back pain is rarely dangerous, most people can learn to manage this condition on their own. HOME CARE INSTRUCTIONS Watch your back pain for any changes. The following actions may help to lessen any discomfort you are feeling:  Remain active. It is stressful on your back to sit or stand in one place for long periods of time. Do not sit, drive, or stand in one place for more than 30 minutes at a time. Take short walks on even surfaces as soon as you are able.Try to increase the length of time you walk each day.  Exercise regularly as directed by your health care provider. Exercise helps your back heal faster. It also helps avoid future injury by keeping your muscles strong and flexible.  Do not stay in bed.Resting more than 1-2 days can delay your recovery.  Pay attention to your body when you bend and lift. The most comfortable positions are those that put less stress on your recovering back. Always use proper lifting techniques, including:  Bending your knees.  Keeping the load close to your body.  Avoiding twisting.  Find a comfortable position to sleep. Use a firm mattress and lie on your side with your knees slightly bent. If you lie on your back, put a pillow under your knees.  Avoid feeling anxious or stressed.Stress increases muscle tension and can worsen back pain.It is important to recognize when you are anxious or stressed and learn ways to manage it, such as with exercise.  Take medicines only as directed by your health care provider. Over-the-counter  medicines to reduce pain and inflammation are often the most helpful.Your health care provider may prescribe muscle relaxant drugs.These medicines help dull your pain so you can more quickly return to your normal activities and healthy exercise.  Apply ice to the injured area:  Put ice in a plastic bag.  Place a towel between your skin and the bag.  Leave the ice on for 20 minutes, 2-3 times a day for the first 2-3 days. After that, ice and heat may be alternated to reduce pain and spasms.  Maintain a healthy weight. Excess weight puts extra stress on your back and makes it difficult to maintain good posture. SEEK MEDICAL CARE IF:  You have pain that is not relieved with rest or medicine.  You have increasing pain going down into the legs or buttocks.  You have pain that does not improve in one week.  You have night pain.  You lose weight.  You have a fever or chills. SEEK IMMEDIATE MEDICAL CARE IF:   You develop new bowel or bladder control problems.  You have unusual weakness or numbness in your arms or legs.  You develop nausea or vomiting.  You develop abdominal pain.  You feel faint.   This information is not intended to replace advice given to you by your health care provider. Make sure you discuss any questions you have with your health care provider.   Document Released: 10/20/2005 Document Revised: 11/10/2014 Document Reviewed: 02/21/2014 Elsevier Interactive Patient Education 2016 [employer]  Back Injury Prevention Back injuries can be very painful. They can also be difficult to heal. After having one back injury, you are more likely to injure your back again. It is important to learn how to avoid injuring or re-injuring your back. The following tips can help you to prevent a back injury. WHAT SHOULD I KNOW ABOUT PHYSICAL FITNESS?  Exercise for 30 minutes per day on most days of the week or as told by your doctor. Make sure to:  Do aerobic exercises,  such as walking, jogging, biking, or swimming.  Do exercises that increase balance and strength, such as tai chi and yoga.  Do stretching exercises. This helps with flexibility.  Try to develop strong belly (abdominal) muscles. Your belly muscles help to support your back.  Stay at a healthy weight. This helps to decrease your risk of a back injury. WHAT SHOULD I KNOW ABOUT MY DIET?  Talk with your doctor about your overall diet. Take supplements and vitamins only as told by your doctor.  Talk with your doctor about how much calcium and vitamin D you need each day. These nutrients help to prevent weakening of the bones (osteoporosis).  Include good sources of calcium in your diet, such as:  Dairy products.  Green leafy vegetables.  Products that have had calcium added to them (fortified).  Include good sources of vitamin D in your diet, such as:  Milk.  Foods that have had vitamin D added to them. WHAT SHOULD I KNOW ABOUT MY POSTURE?  Sit up straight and stand up straight. Avoid leaning forward when you sit or hunching over when you stand.  Choose chairs that have good low-back (lumbar) support.  If you work at a desk, sit close to it so you do not need to lean over. Keep your chin tucked in. Keep your neck drawn back. Keep your elbows bent so your arms look like the letter "L" (right angle).  Sit high and close to the steering wheel when you drive. Add a low-back support to your car seat, if needed.  Avoid sitting or standing in one position for very long. Take breaks to get up, stretch, and walk around at least one time every hour. Take breaks every hour if you are driving for long periods of time.  Sleep on your side with your knees slightly bent, or sleep on your back with a pillow under your knees. Do not lie on the front of your body to sleep. WHAT SHOULD I KNOW ABOUT LIFTING, TWISTING, AND REACHING Lifting and Heavy Lifting  Avoid heavy lifting, especially lifting  over and over again. If you must do heavy lifting:  Stretch before lifting.  Work slowly.  Rest between lifts.  Use a tool such as a cart or a dolly to move objects if one is available.  Make several small trips instead of carrying one heavy load.  Ask for help when you need it, especially when moving big objects.  Follow these steps when lifting:  Stand with your feet shoulder-width apart.  Get as close to the object as you can. Do not pick up a heavy object that is far from your body.  Use handles or lifting straps if they are available.  Bend at your knees. Squat down, but keep your heels off the floor.  Keep your shoulders back. Keep your chin tucked in. Keep your back straight.  Lift the object slowly while you tighten the muscles in your legs, belly, and butt.  Keep the object as close to the center of your body as possible.  Follow these steps when putting down a heavy load:  Stand with your feet shoulder-width apart.  Lower the object slowly while you tighten the muscles in your legs, belly, and butt. Keep the object as close to the center of your body as possible.  Keep your shoulders back. Keep your chin tucked in. Keep your back straight.  Bend at your knees. Squat down, but keep your heels off the floor.  Use handles or lifting straps if they are available. Twisting and Reaching  Avoid lifting heavy objects above your waist.  Do not twist at your waist while you are lifting or carrying a load. If you need to turn, move your feet.  Do not bend over without bending at your knees.  Avoid reaching over your head, across a table, or for an object on a high surface.  WHAT ARE SOME OTHER TIPS?  Avoid wet floors and icy ground. Keep sidewalks clear of ice to prevent falls.   Do not sleep on a mattress that is too soft or too hard.   Keep items that you use often within easy reach.   Put heavier objects on shelves at waist level, and put lighter objects  on lower or higher shelves.  Find ways to lower your stress, such as:  Exercise.  Massage.  Relaxation techniques.  Talk with your doctor if you feel anxious or depressed. These conditions can make back pain worse.  Wear flat heel shoes with cushioned soles.  Avoid making quick (sudden) movements.  Use both shoulder straps when carrying a backpack.  Do not use any tobacco products, including cigarettes, chewing tobacco, or electronic cigarettes. If you need help quitting, ask your doctor.   This information is not intended to replace advice given to you by your health care provider. Make sure you discuss any questions you have with your health care provider.   Document Released: 04/07/2008 Document Revised: 03/06/2015 Document Reviewed: 10/24/2014 Elsevier Interactive Patient Education 2016 [place] therapy can help ease sore, stiff, injured, and tight muscles and joints. Heat relaxes your muscles, which may help ease your pain. Heat therapy should only be used on old, pre-existing, or long-lasting (chronic) injuries. Do not use heat therapy unless told by your doctor. HOW TO USE HEAT THERAPY There are several different kinds of heat therapy, including:  Moist heat pack.  Warm water bath.  Hot water bottle.  Electric heating pad.  Heated gel pack.  Heated wrap.  Electric heating pad. GENERAL HEAT THERAPY RECOMMENDATIONS   Do not sleep while using heat therapy. Only use heat therapy while you are awake.  Your skin may turn pink while using heat therapy. Do not use heat therapy if your skin turns red.  Do not use heat therapy if you have new pain.  High heat or long exposure to heat can cause burns. Be careful when using heat therapy to avoid burning your skin.  Do not use heat therapy on areas of your skin that are already irritated, such as with a rash or sunburn. GET HELP IF:   You have blisters, redness, swelling (puffiness), or  numbness.  You have new pain.  Your pain is worse. MAKE SURE YOU:  Understand these instructions.  Will watch your condition.  Will get help right away if you are not doing well or get worse.   This information is not intended to replace advice  given to you by your health care provider. Make sure you discuss any questions you have with your health care provider.   Document Released: 01/12/2012 Document Revised: 11/10/2014 Document Reviewed: 12/13/2013 Elsevier Interactive Patient Education [employer].

## 2016-02-19 NOTE — ED Provider Notes (Signed)
CSN: [id number]     Arrival date & time 02/19/16  1256 History   First MD Initiated Contact with Patient 02/19/16 1334     Chief Complaint  Patient presents with  . Back Pain   (Consider location/radiation/quality/duration/timing/severity/associated sxs/prior Treatment) HPI Comments: 40 year old male with morbid obesity and type 2 diabetes mellitusis complaining of left low back pain radiates into the lefthip and posterior thigh. It started approximate 5 days ago. There is no history of known injury or trauma. Gradual onset. His job is working at a call center. He states he has prolonged periods of sitting as well as prolonged periods of standing and this tends to exacerbate his back pain, particularly the standing.denies focal weakness or paresthesia. Denies spinal pain.  Patient is a 40 y.o. male presenting with back pain.  Back Pain Associated symptoms: no fever     Past Medical History  Diagnosis Date  . Obesity   . Non-insulin dependent type 2 diabetes mellitus (HCC)   . Sleep apnea     uses CPAP nightly  . Nasal congestion 10/03/2014  . Hypertension   . Wears contact lenses    Past Surgical History  Procedure Laterality Date  . Cholecystectomy N/A 10/09/2014    Procedure: LAPAROSCOPIC CHOLECYSTECTOMY;  Surgeon: [name], MD;  Location: [facility];  Service: General;  Laterality: N/A;   Family History  Problem Relation Age of Onset  . Hypertension Mother   . Kidney disease Father   . Diabetes Maternal Aunt   . Diabetes Paternal Aunt   . Cancer Maternal Grandfather     throat  . Stroke Paternal Grandmother   . Alcohol abuse Paternal Grandmother   . Heart disease Neg Hx    Social History  Substance Use Topics  . Smoking status: Never Smoker   . Smokeless tobacco: Never Used  . Alcohol Use: Yes     Comment: occasional    Review of Systems  Constitutional: Positive for activity change. Negative for fever and fatigue.  HENT: Negative.    Respiratory: Negative.   Cardiovascular: Negative.   Genitourinary: Negative.   Musculoskeletal: Positive for myalgias and back pain. Negative for neck pain and neck stiffness.  Skin: Negative.   Neurological: Negative.     Allergies  Review of patient's allergies indicates no known allergies.  Home Medications   Prior to Admission medications   Medication Sig Start Date End Date Taking? Authorizing Provider  benazepril-hydrochlorthiazide (LOTENSIN HCT) 20-12.5 MG tablet TAKE 2 TABLETS DAILY 02/18/16  Yes [name] [name], PA-C  amLODipine (NORVASC) 10 MG tablet Take 1 tablet (10 mg total) by mouth daily. 07/06/15   [name] [name], PA-C  atenolol (TENORMIN) 50 MG tablet Take 1 tablet (50 mg total) by mouth daily. 07/06/15   [name] [name], PA-C  diclofenac (CATAFLAM) 50 MG tablet Take 1 tablet (50 mg total) by mouth 3 (three) times daily. One tablet TID with food prn pain. 02/19/16   Hayden Rasmussen, NP  Empagliflozin-Linagliptin (GLYXAMBI) 10-5 MG TABS Take 1 tablet by mouth daily. 07/06/15   [name] [name], PA-C  potassium chloride SA (K-DUR,KLOR-CON) 20 MEQ tablet Take 1 tablet (20 mEq total) by mouth 2 (two) times daily. 07/06/15   [name] [name], PA-C  pravastatin (PRAVACHOL) 20 MG tablet Take 1 tablet (20 mg total) by mouth daily. 07/06/15   Jac Canavan, PA-C   Meds Ordered and Administered this Visit  Medications - No data to display  BP 150/89 mmHg  Pulse 97  Temp(Src) 98 F (36.7 C) (Oral)  Resp 18  SpO2 97% No data found.   Physical Exam  Constitutional: He is oriented to person, place, and time. He appears well-developed and well-nourished. No distress.  Neck: Normal range of motion. Neck supple.  Cardiovascular: Normal rate.   Pulmonary/Chest: Effort normal. No respiratory distress.  Musculoskeletal: Normal range of motion. He exhibits tenderness. He exhibits no edema.  Tenderness to the left paralumbar musculature. Mild tenderness to theposterior hip and thigh.  Having patient perform straight leg raises produces pain in the back. No spinal tenderness, deformity, swelling or discoloration.Distal neurovascular motor Sentry is intact. Strength is 5 over 5.  Neurological: He is alert and oriented to person, place, and time. No cranial nerve deficit. He exhibits normal muscle tone.  Skin: Skin is warm and dry.  Psychiatric: He has a normal mood and affect.  Nursing note and vitals reviewed.   ED Course  Procedures (including critical care time)  Labs Review Labs Reviewed - No data to display  Imaging Review No results found.   Visual Acuity Review  Right Eye Distance:   Left Eye Distance:   Bilateral Distance:    Right Eye Near:   Left Eye Near:    Bilateral Near:         MDM   1. Left-sided low back pain without sciatica   2. Muscle strain   3. Type 2 diabetes mellitus without complication, without long-term current use of insulin (HCC)   4. Morbid obesity due to excess calories (HCC)    iscussed applying heat to the area of soreness, using good posture, performing daily stretches several times a day, Cata[place]am as directed for pain.    Hayden Rasmussenavid Verner Kopischke, NP 02/19/16 1350

## 2016-02-19 NOTE — ED Notes (Signed)
Woke with pain in lower left back and left leg.  No numbness, no issues with bowel or bladder.

## 2016-02-20 ENCOUNTER — Telehealth (HOSPITAL_COMMUNITY): Payer: Self-pay | Admitting: Emergency Medicine

## 2016-02-20 NOTE — ED Notes (Signed)
Patient came to the urgent care asking for a note for work today.  Patient reports pain is a little less, but continues to hurt with long periods of standing.   Hayden Rasmussenavid Mabe, NP agreed to work note for this patient today

## 2016-04-04 ENCOUNTER — Encounter (HOSPITAL_COMMUNITY): Payer: Self-pay | Admitting: *Deleted

## 2016-04-04 ENCOUNTER — Ambulatory Visit (HOSPITAL_COMMUNITY)
Admission: EM | Admit: 2016-04-04 | Discharge: 2016-04-04 | Disposition: A | Discharge status: Home / Self Care | Payer: Managed Care, Other (non HMO) | Attending: Emergency Medicine | Admitting: Emergency Medicine

## 2016-04-04 DIAGNOSIS — R51 Headache: Secondary | ICD-10-CM

## 2016-04-04 DIAGNOSIS — I10 Essential (primary) hypertension: Secondary | ICD-10-CM | POA: Diagnosis not present

## 2016-04-04 DIAGNOSIS — R519 Headache, unspecified: Secondary | ICD-10-CM

## 2016-04-04 MED ORDER — CLONIDINE HCL 0.1 MG PO TABS
0.1000 mg | ORAL_TABLET | Freq: Once | ORAL | Status: AC
  Administered 2016-04-04: 0.1 mg via ORAL

## 2016-04-04 MED ORDER — CLONIDINE HCL 0.1 MG PO TABS
ORAL_TABLET | ORAL | Status: AC
  Filled 2016-04-04: qty 1

## 2016-04-04 NOTE — ED Provider Notes (Signed)
CSN: [id number]     Arrival date & time 04/04/16  1306 History   First MD Initiated Contact with Patient 04/04/16 1342     Chief Complaint  Patient presents with  . Headache   (Consider location/radiation/quality/duration/timing/severity/associated sxs/prior Treatment) HPI History obtained from patient:  Pt presents with the cc of:  Dizziness and headache Duration of symptoms: A few days Treatment prior to arrival: None Context: Onset of couple of days ago of dizziness after getting up. Symptoms would last for a couple of hours and then slowly get better. Other symptoms include: Headache Pain score: 1 FAMILY HISTORY: Hypertension mother, kidney disease-father     Past Medical History  Diagnosis Date  . Obesity   . Non-insulin dependent type 2 diabetes mellitus (HCC)   . Sleep apnea     uses CPAP nightly  . Nasal congestion 10/03/2014  . Hypertension   . Wears contact lenses    Past Surgical History  Procedure Laterality Date  . Cholecystectomy N/A 10/09/2014    Procedure: LAPAROSCOPIC CHOLECYSTECTOMY;  Surgeon: Abigail Miyamotoouglas Blackman, MD;  Location: [facility];  Service: General;  Laterality: N/A;   Family History  Problem Relation Age of Onset  . Hypertension Mother   . Kidney disease Father   . Diabetes Maternal Aunt   . Diabetes Paternal Aunt   . Cancer Maternal Grandfather     throat  . Stroke Paternal Grandmother   . Alcohol abuse Paternal Grandmother   . Heart disease Neg Hx    Social History  Substance Use Topics  . Smoking status: Never Smoker   . Smokeless tobacco: Never Used  . Alcohol Use: Yes     Comment: occasional    Review of Systems  Denies:  NAUSEA, ABDOMINAL PAIN, CHEST PAIN, CONGESTION, DYSURIA, SHORTNESS OF BREATH  Allergies  Review of patient's allergies indicates no known allergies.  Home Medications   Prior to Admission medications   Medication Sig Start Date End Date Taking? Authorizing Provider  amLODipine (NORVASC) 10 MG  tablet Take 1 tablet (10 mg total) by mouth daily. 07/06/15   Kermit Baloavid S [name], PA-C  atenolol (TENORMIN) 50 MG tablet Take 1 tablet (50 mg total) by mouth daily. 07/06/15   Kermit Baloavid S [name], PA-C  benazepril-hydrochlorthiazide (LOTENSIN HCT) 20-12.5 MG tablet TAKE 2 TABLETS DAILY 02/18/16   Kermit Baloavid S [name], PA-C  diclofenac (CATAFLAM) 50 MG tablet Take 1 tablet (50 mg total) by mouth 3 (three) times daily. One tablet TID with food prn pain. 02/19/16   Hayden Rasmussenavid Mabe, NP  Empagliflozin-Linagliptin (GLYXAMBI) 10-5 MG TABS Take 1 tablet by mouth daily. 07/06/15   Kermit Baloavid S [name], PA-C  potassium chloride SA (K-DUR,KLOR-CON) 20 MEQ tablet Take 1 tablet (20 mEq total) by mouth 2 (two) times daily. 07/06/15   Kermit Baloavid S [name], PA-C  pravastatin (PRAVACHOL) 20 MG tablet Take 1 tablet (20 mg total) by mouth daily. 07/06/15   Jac Canavanavid S [name], PA-C   Meds Ordered and Administered this Visit   Medications  cloNIDine (CATAPRES) tablet 0.1 mg (not administered)    BP 152/104 mmHg  Pulse 78  Temp(Src) 98.6 F (37 C) (Oral)  Resp 18  SpO2 100% No data found.   Physical Exam NURSES NOTES AND VITAL SIGNS REVIEWED. CONSTITUTIONAL: Well developed, well nourished, no acute distress HEENT: normocephalic, atraumatic EYES: Conjunctiva normal NECK:normal ROM, supple, no adenopathy PULMONARY:No respiratory distress, normal effort CV: pulses are equal no bruits. RRR-m ABDOMINAL: Soft, ND, NT BS+, No CVAT MUSCULOSKELETAL: Normal ROM of all extremities,  SKIN: warm and dry without rash PSYCHIATRIC: Mood and affect, behavior are normal  ED Course  Procedures (including critical care time)  Labs Review Labs Reviewed - No data to display  Imaging Review No results found.   Visual Acuity Review  Right Eye Distance:   Left Eye Distance:   Bilateral Distance:    Right Eye Near:   Left Eye Near:    Bilateral Near:      Clonidine 0.1 mg given with reduction in BP 136/95 and patient feeling much better.   Discussed adjustment of medications with patient, would prefer for him to see his PCP for adjustment.   Pt needs to follow up with PCP for medication adjustment and glucose follow up. Prognosis is good that patient will do well without any acute disability.  MDM   1. Headache, unspecified headache type   2. Essential hypertension     Patient is reassured that there are no issues that require transfer to higher level of care at this time or additional tests. Patient is advised to continue home symptomatic treatment. Patient is advised that if there are new or worsening symptoms to attend the emergency department, contact primary care provider, or return to UC. Instructions of care provided discharged home in stable condition.    THIS NOTE WAS GENERATED USING A VOICE RECOGNITION SOFTWARE PROGRAM. ALL REASONABLE EFFORTS  WERE MADE TO PROOFREAD THIS DOCUMENT FOR ACCURACY.  I have verbally reviewed the discharge instructions with the patient. A printed AVS was given to the patient.  All questions were answered prior to discharge.      Tharon Aquas, PA 04/04/16 1512

## 2016-04-04 NOTE — Discharge Instructions (Signed)

## 2016-04-04 NOTE — ED Notes (Signed)
Pt  Reports    Symptoms   Of       Dizzy  Headache      Weakness      Symptoms   Since    Yesterday       Pt  denys  Any   Nausea  Vomiting  Or  Diarrhea       Pt   Ambulated  To  Room  With a  Steady  Fluid  Gait     He  Is alert  And  Oriented

## 2016-04-09 ENCOUNTER — Ambulatory Visit (INDEPENDENT_AMBULATORY_CARE_PROVIDER_SITE_OTHER): Payer: Managed Care, Other (non HMO) | Admitting: Medical

## 2016-04-09 ENCOUNTER — Encounter: Payer: Self-pay | Admitting: Medical

## 2016-04-09 VITALS — BP 142/84 | HR 98 | Wt 343.0 lb

## 2016-04-09 DIAGNOSIS — G4733 Obstructive sleep apnea (adult) (pediatric): Secondary | ICD-10-CM

## 2016-04-09 DIAGNOSIS — R5383 Other fatigue: Secondary | ICD-10-CM

## 2016-04-09 DIAGNOSIS — E876 Hypokalemia: Secondary | ICD-10-CM

## 2016-04-09 DIAGNOSIS — E1169 Type 2 diabetes mellitus with other specified complication: Secondary | ICD-10-CM

## 2016-04-09 DIAGNOSIS — Z566 Other physical and mental strain related to work: Secondary | ICD-10-CM

## 2016-04-09 DIAGNOSIS — Z9119 Patient's noncompliance with other medical treatment and regimen: Secondary | ICD-10-CM

## 2016-04-09 DIAGNOSIS — R5381 Other malaise: Secondary | ICD-10-CM

## 2016-04-09 DIAGNOSIS — E119 Type 2 diabetes mellitus without complications: Secondary | ICD-10-CM | POA: Diagnosis not present

## 2016-04-09 DIAGNOSIS — Z91199 Patient's noncompliance with other medical treatment and regimen due to unspecified reason: Secondary | ICD-10-CM

## 2016-04-09 DIAGNOSIS — E669 Obesity, unspecified: Secondary | ICD-10-CM

## 2016-04-09 DIAGNOSIS — I10 Essential (primary) hypertension: Secondary | ICD-10-CM

## 2016-04-09 DIAGNOSIS — R0600 Dyspnea, unspecified: Secondary | ICD-10-CM

## 2016-04-09 DIAGNOSIS — Z9989 Dependence on other enabling machines and devices: Secondary | ICD-10-CM

## 2016-04-09 LAB — BASIC METABOLIC PANEL
BUN: 13 mg/dL (ref 7–25)
CHLORIDE: 100 mmol/L (ref 98–110)
CO2: 26 mmol/L (ref 20–31)
Calcium: 9 mg/dL (ref 8.6–10.3)
Creat: 0.9 mg/dL (ref 0.60–1.35)
GLUCOSE: 139 mg/dL — AB (ref 65–99)
Potassium: 3 mmol/L — ABNORMAL LOW (ref 3.5–5.3)
Sodium: 141 mmol/L (ref 135–146)

## 2016-04-09 LAB — HEMOGLOBIN A1C
Hgb A1c MFr Bld: 7.1 % — ABNORMAL HIGH (ref <5.7)
Mean Plasma Glucose: 157 mg/dL

## 2016-04-09 MED ORDER — BENAZEPRIL-HYDROCHLOROTHIAZIDE 20-12.5 MG PO TABS: 2.0000 | ORAL_TABLET | Freq: Every day | ORAL | Status: DC

## 2016-04-09 MED ORDER — POTASSIUM CHLORIDE CRYS ER 20 MEQ PO TBCR: 20.0000 meq | EXTENDED_RELEASE_TABLET | Freq: Two times a day (BID) | ORAL | Status: DC

## 2016-04-09 MED ORDER — AMLODIPINE BESYLATE 10 MG PO TABS: 10.0000 mg | ORAL_TABLET | Freq: Every day | ORAL | Status: DC

## 2016-04-09 MED ORDER — PRAVASTATIN SODIUM 20 MG PO TABS: 20.0000 mg | ORAL_TABLET | Freq: Every day | ORAL | Status: DC

## 2016-04-09 NOTE — Progress Notes (Signed)
Subjective: Chief Complaint  Patient presents with  . UC F/up    had headache thursday and was really tired, just felt bad. Ended up going to UC because of it and his bp was 157/115. was told to f/up here is fasting   Here for blood pressure concern.   Went to urgent care for not feeling well last week.  Was feeling dizzy, head was hurting.  BP was 157/115 there.[name].  He is compliant with Benazepril HCT 20/12.5mg  daily.  Using CPAP most days.   Denies any change in diet or salt intact.  He went to see his Godfather yesterday, and stress was discussed as a factor.  He is in a merger situation at work.  [employer] recently merged to form [employer] and this has been tough.  Denies swelling, has had a few time of feeling SOB though.  No chest pain.  No other aggravating or relieving factors. No other complaint.  Past Medical History  Diagnosis Date  . Obesity   . Non-insulin dependent type 2 diabetes mellitus (HCC)   . Sleep apnea     uses CPAP nightly  . Nasal congestion 10/03/2014  . Hypertension   . Wears contact lenses    ROS as in subjective   Objective: BP 142/84 mmHg  Pulse 98  Wt 343 lb (155.584 kg)  BP Readings from Last 3 Encounters:  04/09/16 142/84  04/04/16 136/95  02/19/16 150/89   Wt Readings from Last 3 Encounters:  04/09/16 343 lb (155.584 kg)  07/05/15 330 lb (149.687 kg)  10/09/14 341 lb (154.677 kg)    General appearance: alert, no distress, WD/WN, obese AA male Oral cavity: MMM, no lesions Neck: supple, no lymphadenopathy, no thyromegaly, no masses, no bruits Heart: RRR, normal S1, S2, no murmurs Pulses: 2+ symmetric, upper and lower extremities, normal cap refill Ext: no edema   Adult ECG Report  Indication: dyspnea, HTN  Rate: 84 bpm  Rhythm: normal sinus rhythm  QRS Axis: 25 degrees  PR Interval: 192ms  QRS Duration: 78ms  QTc: 458ms  Conduction Disturbances: none  Other Abnormalities: Q in III isolated  Patient's cardiac risk factors are:  diabetes mellitus, dyslipidemia, hypertension, male gender, obesity (BMI >= 30 kg/m2) and sedentary lifestyle.  EKG comparison: 08/2014 EKG  Narrative Interpretation: no acute change    Assessment: Encounter Diagnoses  Name Primary?  . Essential hypertension Yes  . Diabetes mellitus type 2 in obese (HCC)   . Morbid obesity, unspecified obesity type (HCC)   . OSA on CPAP   . Noncompliance   . Hypokalemia   . Dyspnea   . Malaise and fatigue   . Work-related stress      Plan: Elevated BPs, dyspnea, fatigue and malaise - His recent elevated BPs and malaise are likely related to a variety of factors including work stress, new work schedule, less free time to exercise and spend with family, 10lb weight gain since last visit, and noncompliance from 07/2015 recommendations from his physical.  He for whatever reason didn't start any of the medications we recommended adding at that point.     EKG reviewed today, labs today.  C/t benazepril HCT 20/12.5mg  daily, add Amlodipine 10mg  daily.  C/t to monitor BP, try and lose weight, limit salt, try and get exercise regularly  Diabetes - needs to monitor glucose some, pending labs will likely add medication.      hypokalemia - labs today  OSA - c/t CPAP  Work related stress - encouraged him  to be patient and find a way to make a difference at work, provide + culture for him and his coworkers despite the punitive environment currently  F/u pending labs

## 2016-04-09 NOTE — Patient Instructions (Signed)
Recommendations:  Continue Benazepril HCT 20/12.5mg , 2 tablets daily  BEGIN Amlodipine 10mg  daily in the morning for blood pressure  BEGIN Kdur potassium since your potassium has been low.  This is twice daily  BEGIN Pravachol cholesterol medication to help prevent heart disease  Try and lose some weight which improved blood pressure and blood sugar  Try and get exercise and eat healthy despite the stress at work  continue CPAP therapy  We will call with lab results  Monitor your blood pressure 2-3 times per week  Monitor your glucose in the morning fasting a few times per week  Let me know if you need prescription for glucometer and testing supplies

## 2016-04-10 ENCOUNTER — Other Ambulatory Visit: Payer: Self-pay | Admitting: Medical

## 2016-04-10 MED ORDER — EMPAGLIFLOZIN-METFORMIN HCL 5-500 MG PO TABS: 1.0000 | ORAL_TABLET | Freq: Two times a day (BID) | ORAL | Status: DC

## 2016-04-11 ENCOUNTER — Telehealth: Payer: Self-pay | Admitting: Medical

## 2016-04-15 NOTE — Telephone Encounter (Signed)
T# [id number] unable to complete over the phone states has to have been on metformin in past 180 days.  Completed P.A. On Cover my meds and was approved.  Called pharmacy & went thru for $0 co pay, they will order and it will be in tomorrow after 4.  Left message for pt

## 2016-04-22 ENCOUNTER — Telehealth: Payer: Self-pay | Admitting: Medical

## 2016-04-22 ENCOUNTER — Other Ambulatory Visit: Payer: Self-pay | Admitting: Medical

## 2016-04-22 MED ORDER — SITAGLIPTIN PHOS-METFORMIN HCL 50-1000 MG PO TABS: 1.0000 | ORAL_TABLET | Freq: Two times a day (BID) | ORAL | Status: DC

## 2016-04-22 NOTE — Telephone Encounter (Signed)
Synjardy apparently isn't covered by insurance.  Lets try Janumet instead for diabetes. If this is not covered either, then he will need to call his insurer to formulary to see what is covered

## 2016-04-23 NOTE — Telephone Encounter (Signed)
LMTCB

## 2016-04-30 NOTE — Telephone Encounter (Signed)
LMTCB

## 2016-05-01 NOTE — Telephone Encounter (Signed)
Pt is aware and said he will call insurance if this is not covered

## 2016-06-18 ENCOUNTER — Telehealth: Payer: Self-pay

## 2016-06-18 NOTE — Telephone Encounter (Signed)
I don't recall seeing any order

## 2016-06-18 NOTE — Telephone Encounter (Signed)
Sp[facility] with [place] [facility] in the sleep department. She ask if you have rcvd the order on this pt for a oral appliance. She can be reached at [phone]. Trixie Rude/RLB

## 2016-06-19 NOTE — Telephone Encounter (Signed)
Spoke with Martie LeeSabrina- she will refax. Trixie Rude/rlb

## 2016-08-13 ENCOUNTER — Encounter: Payer: Self-pay | Admitting: Medical

## 2016-08-19 ENCOUNTER — Encounter: Payer: Self-pay | Admitting: Medical

## 2016-08-19 ENCOUNTER — Ambulatory Visit (INDEPENDENT_AMBULATORY_CARE_PROVIDER_SITE_OTHER): Payer: Managed Care, Other (non HMO) | Admitting: Medical

## 2016-08-19 VITALS — BP 132/80 | HR 80 | Resp 16 | Wt 345.8 lb

## 2016-08-19 DIAGNOSIS — R5383 Other fatigue: Secondary | ICD-10-CM | POA: Insufficient documentation

## 2016-08-19 DIAGNOSIS — Z23 Encounter for immunization: Secondary | ICD-10-CM

## 2016-08-19 DIAGNOSIS — I10 Essential (primary) hypertension: Secondary | ICD-10-CM

## 2016-08-19 DIAGNOSIS — Z9989 Dependence on other enabling machines and devices: Secondary | ICD-10-CM | POA: Diagnosis not present

## 2016-08-19 DIAGNOSIS — E1169 Type 2 diabetes mellitus with other specified complication: Secondary | ICD-10-CM

## 2016-08-19 DIAGNOSIS — Z9049 Acquired absence of other specified parts of digestive tract: Secondary | ICD-10-CM | POA: Diagnosis not present

## 2016-08-19 DIAGNOSIS — R1030 Lower abdominal pain, unspecified: Secondary | ICD-10-CM

## 2016-08-19 DIAGNOSIS — R0789 Other chest pain: Secondary | ICD-10-CM

## 2016-08-19 DIAGNOSIS — G4733 Obstructive sleep apnea (adult) (pediatric): Secondary | ICD-10-CM

## 2016-08-19 DIAGNOSIS — K760 Fatty (change of) liver, not elsewhere classified: Secondary | ICD-10-CM | POA: Diagnosis not present

## 2016-08-19 DIAGNOSIS — R1011 Right upper quadrant pain: Secondary | ICD-10-CM | POA: Insufficient documentation

## 2016-08-19 DIAGNOSIS — E669 Obesity, unspecified: Secondary | ICD-10-CM

## 2016-08-19 LAB — CBC
HCT: 41.8 % (ref 38.5–50.0)
Hemoglobin: 14.2 g/dL (ref 13.2–17.1)
MCH: 28.8 pg (ref 27.0–33.0)
MCHC: 34 g/dL (ref 32.0–36.0)
MCV: 84.8 fL (ref 80.0–100.0)
MPV: 10.8 fL (ref 7.5–12.5)
PLATELETS: 195 10*3/uL (ref 140–400)
RBC: 4.93 MIL/uL (ref 4.20–5.80)
RDW: 14.5 % (ref 11.0–15.0)
WBC: 5.3 10*3/uL (ref 4.0–10.5)

## 2016-08-19 LAB — TSH: TSH: 1.96 m[IU]/L (ref 0.40–4.50)

## 2016-08-19 LAB — PROTIME-INR
INR: 1
Prothrombin Time: 11.1 s (ref 9.0–11.5)

## 2016-08-19 LAB — HEMOGLOBIN A1C
Hgb A1c MFr Bld: 6.1 % — ABNORMAL HIGH (ref <5.7)
Mean Plasma Glucose: 128 mg/dL

## 2016-08-19 LAB — APTT: APTT: 29 s (ref 22–34)

## 2016-08-19 NOTE — Progress Notes (Signed)
Subjective: Chief Complaint  Patient presents with  . Abdominal Pain    with coughing and sneezing- worsening pain. Requesting FMLA papers to be filled out.    Here for abdominal pain, worse if coughing and sneezing. Pain is in right upper abdomen, intermittent x few months.   Denies sweats, nausea. Sometimes gets SOB.   No chest pain.   No swelling in legs.  right upper pain not worse with eating.   No back pain.   No recent heavy lifting.  Having cramping in lower abdomen x 2 months.   Aches and goes away.  Nonsmoker.  No other aggravating or relieving factors.   Has no energy of late.  Using CPAP, but will feel sleepy in the day, but more energetic at bedtime.    Compliant with medications for diabetes, HTN, cholesterol.  Has FMLA he says work is requiring for his appt.  No other complaint.   Past Medical History:  Diagnosis Date  . Hypertension   . Nasal congestion 10/03/2014  . Non-insulin dependent type 2 diabetes mellitus (HCC)   . Obesity   . Sleep apnea    uses CPAP nightly  . Wears contact lenses    Past Surgical History:  Procedure Laterality Date  . CHOLECYSTECTOMY N/A 10/09/2014   Procedure: LAPAROSCOPIC CHOLECYSTECTOMY;  Surgeon: [name], MD;  Location: [facility];  Service: General;  Laterality: N/A;   Current Outpatient Prescriptions on File Prior to Visit  Medication Sig Dispense Refill  . amLODipine (NORVASC) 10 MG tablet Take 1 tablet (10 mg total) by mouth daily. 90 tablet 3  . benazepril-hydrochlorthiazide (LOTENSIN HCT) 20-12.5 MG tablet Take 2 tablets by mouth daily. 180 tablet 3  . potassium chloride SA (K-DUR,KLOR-CON) 20 MEQ tablet Take 1 tablet (20 mEq total) by mouth 2 (two) times daily. 180 tablet 3  . pravastatin (PRAVACHOL) 20 MG tablet Take 1 tablet (20 mg total) by mouth daily. 90 tablet 1  . sitaGLIPtin-metformin (JANUMET) 50-1000 MG tablet Take 1 tablet by mouth 2 (two) times daily with a meal. (Patient not taking:  Reported on 08/19/2016) 180 tablet 1   No current facility-administered medications on file prior to visit.    ROS as in subjective   Objective: BP 132/80   Pulse 80   Resp 16   Wt (!) 345 lb 12.8 oz (156.9 kg)   BMI 52.58 kg/m   Wt Readings from Last 3 Encounters:  08/19/16 (!) 345 lb 12.8 oz (156.9 kg)  04/09/16 (!) 343 lb (155.6 kg)  07/05/15 (!) 330 lb (149.7 kg)   General appearance: alert, no distress, WD/WN,  Oral cavity: MMM, no lesions Neck: supple, no lymphadenopathy, no thyromegaly, no masses Heart: RRR, normal S1, S2, no murmurs Lungs: CTA bilaterally, no wheezes, rhonchi, or rales Abdomen: +bs, soft, port surgical scars from prior choly, tender RUQ,otherwise non tender, non distended, no masses, no hepatomegaly, no splenomegaly, obese abdomen Back: nontender Skin : no rash, no shingles Pulses: 2+ symmetric, upper and lower extremities, normal cap refill GU: normal male circumcised, no hernia, no abnormality Ext: no edema    Assessment: Encounter Diagnoses  Name Primary?  . RUQ abdominal pain Yes  . Lower abdominal pain   . Chest wall pain   . Essential hypertension   . OSA on CPAP   . Diabetes mellitus type 2 in obese (HCC)   . S/P cholecystectomy   . Morbid obesity (HCC)   . Fatigue, unspecified type   . Decreased energy   .  Fatty liver   . Need for prophylactic vaccination and inoculation against influenza      Plan: RUQ abdominal pain, chest wall pain - unclear etiology.  Labs today, CXR, and may need to consider abdominal imaging.   reviewed CT abdomen from 2015.    Fatigue, low energy, on CPAP already.  Labs today including testosterone  Fatty liver disease - labs today  Diabetes, hypertension, hyperlipidemia - labs today.   Counseled on the influenza virus vaccine.  Vaccine information sheet given.  Influenza vaccine given after consent obtained.  He declines Td and pneumococcal vaccines today  Completed his FMLA paperwork  Ramon Dredgedward  was seen today for abdominal pain.  Diagnoses and all orders for this visit:  RUQ abdominal pain -     Comprehensive metabolic panel -     CBC -     Lipase -     DG Chest 2 View; Future  Lower abdominal pain  Chest wall pain -     DG Chest 2 View; Future  Essential hypertension  OSA on CPAP  Diabetes mellitus type 2 in obese (HCC) -     Comprehensive metabolic panel -     Lipid panel -     Microalbumin / creatinine urine ratio -     Hemoglobin A1c  S/P cholecystectomy  Morbid obesity (HCC) -     TSH -     Testosterone  Fatigue, unspecified type -     TSH -     Testosterone  Decreased energy -     TSH -     Testosterone  Fatty liver -     Comprehensive metabolic panel -     Protime-INR -     APTT  Need for prophylactic vaccination and inoculation against influenza

## 2016-08-20 LAB — COMPREHENSIVE METABOLIC PANEL
ALT: 24 U/L (ref 9–46)
AST: 17 U/L (ref 10–40)
Albumin: 4.1 g/dL (ref 3.6–5.1)
Alkaline Phosphatase: 56 U/L (ref 40–115)
BUN: 13 mg/dL (ref 7–25)
CHLORIDE: 99 mmol/L (ref 98–110)
CO2: 27 mmol/L (ref 20–31)
CREATININE: 0.97 mg/dL (ref 0.60–1.35)
Calcium: 8.8 mg/dL (ref 8.6–10.3)
Glucose, Bld: 125 mg/dL — ABNORMAL HIGH (ref 65–99)
POTASSIUM: 3.1 mmol/L — AB (ref 3.5–5.3)
SODIUM: 141 mmol/L (ref 135–146)
Total Bilirubin: 1.1 mg/dL (ref 0.2–1.2)
Total Protein: 6.6 g/dL (ref 6.1–8.1)

## 2016-08-20 LAB — TESTOSTERONE: Testosterone: 195 ng/dL — ABNORMAL LOW (ref 250–827)

## 2016-08-20 LAB — MICROALBUMIN / CREATININE URINE RATIO
Creatinine, Urine: 136 mg/dL (ref 20–370)
MICROALB/CREAT RATIO: 20 ug/mg{creat} (ref <30)
Microalb, Ur: 2.7 mg/dL

## 2016-08-20 LAB — LIPID PANEL
CHOL/HDL RATIO: 4.5 ratio (ref <=5.0)
Cholesterol: 138 mg/dL (ref 125–200)
HDL: 31 mg/dL — ABNORMAL LOW (ref >=40)
LDL CALC: 75 mg/dL (ref <130)
Triglycerides: 161 mg/dL — ABNORMAL HIGH (ref <150)
VLDL: 32 mg/dL — AB (ref <30)

## 2016-08-20 LAB — LIPASE: LIPASE: 30 U/L (ref 7–60)

## 2016-08-21 ENCOUNTER — Ambulatory Visit
Admission: RE | Admit: 2016-08-21 | Discharge: 2016-08-21 | Disposition: A | Discharge status: Home / Self Care | Payer: Managed Care, Other (non HMO) | Source: Ambulatory Visit | Attending: Medical | Admitting: Medical

## 2016-08-21 DIAGNOSIS — R1011 Right upper quadrant pain: Secondary | ICD-10-CM

## 2016-08-21 DIAGNOSIS — R0789 Other chest pain: Secondary | ICD-10-CM

## 2016-08-26 ENCOUNTER — Ambulatory Visit (INDEPENDENT_AMBULATORY_CARE_PROVIDER_SITE_OTHER): Payer: Managed Care, Other (non HMO) | Admitting: Medical

## 2016-08-26 ENCOUNTER — Telehealth: Payer: Self-pay

## 2016-08-26 ENCOUNTER — Encounter: Payer: Self-pay | Admitting: Medical

## 2016-08-26 VITALS — BP 136/80 | HR 76 | Resp 16 | Wt 341.8 lb

## 2016-08-26 DIAGNOSIS — R1030 Lower abdominal pain, unspecified: Secondary | ICD-10-CM

## 2016-08-26 DIAGNOSIS — R1011 Right upper quadrant pain: Secondary | ICD-10-CM

## 2016-08-26 DIAGNOSIS — Z9049 Acquired absence of other specified parts of digestive tract: Secondary | ICD-10-CM | POA: Diagnosis not present

## 2016-08-26 DIAGNOSIS — E349 Endocrine disorder, unspecified: Secondary | ICD-10-CM | POA: Diagnosis not present

## 2016-08-26 DIAGNOSIS — R5383 Other fatigue: Secondary | ICD-10-CM

## 2016-08-26 DIAGNOSIS — R0789 Other chest pain: Secondary | ICD-10-CM

## 2016-08-26 DIAGNOSIS — G4733 Obstructive sleep apnea (adult) (pediatric): Secondary | ICD-10-CM | POA: Diagnosis not present

## 2016-08-26 DIAGNOSIS — Z9989 Dependence on other enabling machines and devices: Secondary | ICD-10-CM

## 2016-08-26 DIAGNOSIS — E669 Obesity, unspecified: Secondary | ICD-10-CM

## 2016-08-26 DIAGNOSIS — E876 Hypokalemia: Secondary | ICD-10-CM

## 2016-08-26 DIAGNOSIS — E1169 Type 2 diabetes mellitus with other specified complication: Secondary | ICD-10-CM

## 2016-08-26 DIAGNOSIS — R7989 Other specified abnormal findings of blood chemistry: Secondary | ICD-10-CM

## 2016-08-26 DIAGNOSIS — K760 Fatty (change of) liver, not elsewhere classified: Secondary | ICD-10-CM

## 2016-08-26 DIAGNOSIS — I10 Essential (primary) hypertension: Secondary | ICD-10-CM | POA: Diagnosis not present

## 2016-08-26 DIAGNOSIS — Z125 Encounter for screening for malignant neoplasm of prostate: Secondary | ICD-10-CM

## 2016-08-26 LAB — PSA: PSA: 0.8 ng/mL (ref <=4.0)

## 2016-08-26 MED ORDER — SITAGLIPTIN PHOS-METFORMIN HCL 50-1000 MG PO TABS: 1.0000 | ORAL_TABLET | Freq: Two times a day (BID) | ORAL | 1 refills | Status: DC

## 2016-08-26 MED ORDER — DULAGLUTIDE 1.5 MG/0.5ML ~~LOC~~ SOAJ: 1.8000 mg | Freq: Every day | SUBCUTANEOUS | 2 refills | Status: DC

## 2016-08-26 NOTE — Telephone Encounter (Signed)
done

## 2016-08-26 NOTE — Progress Notes (Signed)
Subjective:  Chief Complaint  Patient presents with  . Follow-up    lab results. pt c/o continued lower left ABD cramping.    Here for f/u from recent visit for abdominal pains, labs.   He still c/t abdominal pain from last visit.  Since last visit the right upper abdomen/chest pain has resolved.  Thinks it was a muscle issue.   But lower abdominal cramping is more persistent.   Its not horrible, but feeling unusual.   No diarrhea, no constipation, having BM at least once daily.   Urine without burning, but sometimes feels like urine is not completing all the way, some hesitancy.  No fever.  No blood in stool.  No known hernia.  Pain in abdomen, intermittent x few months.   Denies sweats, nausea. Sometimes gets SOB.   No chest pain.   No swelling in legs.  right upper pain not worse with eating.   No back pain.   No recent heavy lifting.  Aches and goes away.  Nonsmoker.  No other aggravating or relieving factors.   Currently takes Janumet 50/1000mg  BID for diabetes.  Does report chronic fatigue, really is hard to get going in the mornings.    Past Medical History:  Diagnosis Date  . Hypertension   . Nasal congestion 10/03/2014  . Non-insulin dependent type 2 diabetes mellitus (HCC)   . Obesity   . Sleep apnea    uses CPAP nightly  . Wears contact lenses    Current Outpatient Prescriptions on File Prior to Visit  Medication Sig Dispense Refill  . amLODipine (NORVASC) 10 MG tablet Take 1 tablet (10 mg total) by mouth daily. 90 tablet 3  . benazepril-hydrochlorthiazide (LOTENSIN HCT) 20-12.5 MG tablet Take 2 tablets by mouth daily. 180 tablet 3  . potassium chloride SA (K-DUR,KLOR-CON) 20 MEQ tablet Take 1 tablet (20 mEq total) by mouth 2 (two) times daily. 180 tablet 3  . pravastatin (PRAVACHOL) 20 MG tablet Take 1 tablet (20 mg total) by mouth daily. 90 tablet 1  . sitaGLIPtin-metformin (JANUMET) 50-1000 MG tablet Take 1 tablet by mouth 2 (two) times daily with a meal. 180 tablet 1    No current facility-administered medications on file prior to visit.    Past Surgical History:  Procedure Laterality Date  . CHOLECYSTECTOMY N/A 10/09/2014   Procedure: LAPAROSCOPIC CHOLECYSTECTOMY;  Surgeon: Abigail Miyamotoouglas Blackman, MD;  Location: [facility];  Service: General;  Laterality: N/A;   Family History  Problem Relation Age of Onset  . Hypertension Mother   . Kidney disease Father   . Diabetes Maternal Aunt   . Diabetes Paternal Aunt   . Cancer Maternal Grandfather     throat  . Stroke Paternal Grandmother   . Alcohol abuse Paternal Grandmother   . Heart disease Neg Hx      ROS as in subjective  Objective: BP 136/80   Pulse 76   Resp 16   Wt (!) 341 lb 12.8 oz (155 kg)   BMI 51.97 kg/m   Wt Readings from Last 3 Encounters:  08/26/16 (!) 341 lb 12.8 oz (155 kg)  08/19/16 (!) 345 lb 12.8 oz (156.9 kg)  04/09/16 (!) 343 lb (155.6 kg)   General appearance: alert, no distress, WD/WN,  Oral cavity: MMM, no lesions Neck: supple, no lymphadenopathy, no thyromegaly, no masses Heart: RRR, normal S1, S2, no murmurs Lungs: CTA bilaterally, no wheezes, rhonchi, or rales Abdomen: +bs, soft, port surgical scars from prior choly, tender RUQ, tender generally  in lower abdomen, otherwise non tender, non distended, no masses, no hepatomegaly, no splenomegaly, obese abdomen Back: nontender Skin : no rash, no shingles Pulses: 2+ symmetric, upper and lower extremities, normal cap refill GU: normal male circumcised, no hernia, no abnormality, no lymphadenopathy Ext: no edema DRE: anus normal tone, prostate WNL   Assessment: Encounter Diagnoses  Name Primary?  . Lower abdominal pain Yes  . RUQ abdominal pain   . Chest wall pain   . Fatigue, unspecified type   . Decreased energy   . Hypokalemia   . Morbid obesity (HCC)   . S/P cholecystectomy   . Diabetes mellitus type 2 in obese (HCC)   . Fatty liver   . OSA on CPAP   . Essential hypertension   . Low  testosterone   . Screening for prostate cancer       Plan: abdominal pain - reviewed recent labs, CXR.  Etiology still unclear.   Plan for CT  Discussed his recent low testosterone, symptoms of fatigue, additional labs today.  Assuming TST still low, he wants to begin trial of TST injections.  discussed options for therapy, risks/ benefits, precautions  discussed his diabetes marker, but need for weight loss, healthy diet, exercise.   Recommended he c/t current medication but add Victoza for diabetes and weight loss  HTN - c/t current medication  OSA - c/t CPAP  Fatty liver disease - advised healthy diet, weight loss  Hypokalemia - compliant with medication  [name] was seen today for follow-up.  Diagnoses and all orders for this visit:  Lower abdominal pain -     CT Abdomen Pelvis W Contrast; Future  RUQ abdominal pain -     CT Abdomen Pelvis W Contrast; Future  Chest wall pain  Fatigue, unspecified type -     Prolactin -     FSH/LH -     Testosterone -     PSA  Decreased energy  Hypokalemia  Morbid obesity (HCC)  S/P cholecystectomy  Diabetes mellitus type 2 in obese (HCC)  Fatty liver  OSA on CPAP  Essential hypertension  Low testosterone -     Prolactin -     FSH/LH -     Testosterone -     PSA  Screening for prostate cancer -     PSA

## 2016-08-26 NOTE — Telephone Encounter (Signed)
Please notify me when you finish note, so I can fax documentation to insurance company to try and get approval for CT scan. Trixie Rude/RLB

## 2016-08-27 ENCOUNTER — Other Ambulatory Visit: Payer: Self-pay | Admitting: Medical

## 2016-08-27 ENCOUNTER — Telehealth: Payer: Self-pay | Admitting: Family Medicine

## 2016-08-27 LAB — FSH/LH
FSH: 6.5 m[IU]/mL (ref 1.6–8.0)
LH: 5.3 m[IU]/mL (ref 1.5–9.3)

## 2016-08-27 LAB — PROLACTIN: Prolactin: 10.1 ng/mL (ref 2.0–18.0)

## 2016-08-27 LAB — TESTOSTERONE: TESTOSTERONE: 173 ng/dL — AB (ref 250–827)

## 2016-08-27 MED ORDER — DULAGLUTIDE 1.5 MG/0.5ML ~~LOC~~ SOAJ: 1.8000 mg | SUBCUTANEOUS | 11 refills | Status: DC

## 2016-08-27 MED ORDER — TESTOSTERONE CYPIONATE 200 MG/ML IM SOLN: 200.0000 mg | INTRAMUSCULAR | 2 refills | Status: DC

## 2016-08-27 NOTE — Telephone Encounter (Signed)
Kathlene NovemberMike from [facility] called to clarify the instructions for the Trulicity.  He states should be once weekly and instructions does not say.  Please advise Kathlene NovemberMike [phone]

## 2016-08-27 NOTE — Telephone Encounter (Signed)
Yes, its 1 injection weekly.  I didn't notice it listed incorrectly, sorry

## 2016-08-27 NOTE — Telephone Encounter (Signed)
Kathlene NovemberMike made aware that it is once weekly

## 2016-09-01 ENCOUNTER — Telehealth: Payer: Self-pay | Admitting: Medical

## 2016-09-01 NOTE — Telephone Encounter (Signed)
Vincenza HewsShane, this was denied. What do you want to do?

## 2016-09-01 NOTE — Telephone Encounter (Signed)
[name] EarthlyNoel at [facility] called stating that authorization denied for CT that is scheduled for pt on tomorrow. Please call [name] Earthlyoel back at [phone] with what they need to do

## 2016-09-02 ENCOUNTER — Inpatient Hospital Stay: Admission: RE | Admit: 2016-09-02 | Payer: Managed Care, Other (non HMO) | Source: Ambulatory Visit

## 2016-09-02 ENCOUNTER — Other Ambulatory Visit: Payer: Self-pay | Admitting: Medical

## 2016-09-02 DIAGNOSIS — R1011 Right upper quadrant pain: Secondary | ICD-10-CM

## 2016-09-02 DIAGNOSIS — R1030 Lower abdominal pain, unspecified: Secondary | ICD-10-CM

## 2016-09-02 NOTE — Telephone Encounter (Signed)
Pt is scheduled for 09/10/16 @ 8:00am arriving at 7:40am at [facility] location. NPO after midnight. Pt is aware of appt

## 2016-09-02 NOTE — Telephone Encounter (Signed)
See other message, set up for abdominal ultrasound instead. Insurance is requiring this first.

## 2016-09-03 ENCOUNTER — Telehealth: Payer: Self-pay | Admitting: Medical

## 2016-09-03 NOTE — Telephone Encounter (Signed)
Pt came in and dropped off a addition to FMLA his work is requiring. He states that his recommendation for information requested would be 1 a month for no more than 2 days duration. Please fax to number at top of form. Sending back in folder.

## 2016-09-08 ENCOUNTER — Telehealth: Payer: Self-pay | Admitting: Medical

## 2016-09-08 NOTE — Telephone Encounter (Signed)
I received the extra FMLA paperwork.  I went back and looked at the one I did a week ago.   See his recent telephone message.  I can't write him out 2 days every month.    What are the medical reasons he feel he would miss more than once every 3 months?  Generally if someone has significant enough health issues that warrant loss of work, we need to get specialist involved if we can't find the answer to the ailment.  We currently are still evaluating his abdominal pain, waiting on ultrasound if I'm not mistaking.  But I can't write him out to miss 2 days every month. Vincenza HewsShane

## 2016-09-10 ENCOUNTER — Ambulatory Visit
Admission: RE | Admit: 2016-09-10 | Discharge: 2016-09-10 | Disposition: A | Discharge status: Home / Self Care | Payer: Managed Care, Other (non HMO) | Source: Ambulatory Visit | Attending: Medical | Admitting: Medical

## 2016-09-10 DIAGNOSIS — R1030 Lower abdominal pain, unspecified: Secondary | ICD-10-CM

## 2016-09-10 DIAGNOSIS — R1011 Right upper quadrant pain: Secondary | ICD-10-CM

## 2016-09-11 ENCOUNTER — Other Ambulatory Visit: Payer: Self-pay | Admitting: Medical

## 2016-09-11 MED ORDER — CIPROFLOXACIN HCL 500 MG PO TABS: 500.0000 mg | ORAL_TABLET | Freq: Two times a day (BID) | ORAL | 0 refills | Status: DC

## 2016-09-11 MED ORDER — TAMSULOSIN HCL 0.4 MG PO CAPS: 0.4000 mg | ORAL_CAPSULE | Freq: Every day | ORAL | 2 refills | Status: DC

## 2016-09-11 NOTE — Telephone Encounter (Signed)
Called and l/m to him about his fmla form

## 2016-09-12 NOTE — Telephone Encounter (Signed)
Left message for pt to call me back 

## 2016-09-16 NOTE — Telephone Encounter (Signed)
Called and spoke with patient about this

## 2016-10-11 ENCOUNTER — Other Ambulatory Visit: Payer: Self-pay | Admitting: Medical

## 2016-10-13 ENCOUNTER — Telehealth: Payer: Self-pay

## 2016-10-13 NOTE — Telephone Encounter (Signed)
Faxed request for Trulicity recvd in office to [facility].

## 2016-10-14 ENCOUNTER — Other Ambulatory Visit: Payer: Self-pay

## 2016-10-14 MED ORDER — DULAGLUTIDE 1.5 MG/0.5ML ~~LOC~~ SOAJ: 1.8000 mg | SUBCUTANEOUS | 11 refills | Status: DC

## 2016-10-14 NOTE — Telephone Encounter (Signed)
Faxed refill to mail order pharmacy

## 2016-10-28 ENCOUNTER — Telehealth: Payer: Self-pay | Admitting: Medical

## 2016-10-28 NOTE — Telephone Encounter (Signed)
Refill request for trulicity SD pen .5 ml 4's 1.5mg   90 day supply  With 4 refills

## 2016-10-29 ENCOUNTER — Other Ambulatory Visit: Payer: Self-pay | Admitting: Medical

## 2016-10-29 ENCOUNTER — Other Ambulatory Visit: Payer: Self-pay

## 2016-10-29 MED ORDER — DULAGLUTIDE 1.5 MG/0.5ML ~~LOC~~ SOAJ: 1.8000 mg | SUBCUTANEOUS | 3 refills | Status: DC

## 2016-10-29 NOTE — Telephone Encounter (Signed)
Call to make sure pharmacy got the refill and there may have been issue where insurance only covered 1.5mg  weekly,which is fine

## 2016-10-29 NOTE — Telephone Encounter (Signed)
refaxed to pharmacy.

## 2016-10-30 MED ORDER — DULAGLUTIDE 1.5 MG/0.5ML ~~LOC~~ SOAJ: 1.8000 mg | SUBCUTANEOUS | 11 refills | Status: DC

## 2017-01-28 ENCOUNTER — Other Ambulatory Visit: Payer: Self-pay | Admitting: Medical

## 2017-02-12 ENCOUNTER — Telehealth: Payer: Self-pay

## 2017-02-12 NOTE — Telephone Encounter (Signed)
Pt is due for OV for follow up with [facility]. Attempted call to pt with no VCM available. Letter sent. Trixie Rude

## 2017-04-12 ENCOUNTER — Encounter: Payer: Self-pay | Admitting: Emergency Medicine

## 2017-04-12 ENCOUNTER — Emergency Department
Admission: EM | Admit: 2017-04-12 | Discharge: 2017-04-12 | Disposition: A | Discharge status: Home / Self Care | Payer: Managed Care, Other (non HMO) | Attending: Emergency Medicine | Admitting: Emergency Medicine

## 2017-04-12 DIAGNOSIS — Z9049 Acquired absence of other specified parts of digestive tract: Secondary | ICD-10-CM | POA: Insufficient documentation

## 2017-04-12 DIAGNOSIS — E119 Type 2 diabetes mellitus without complications: Secondary | ICD-10-CM | POA: Insufficient documentation

## 2017-04-12 DIAGNOSIS — K0889 Other specified disorders of teeth and supporting structures: Secondary | ICD-10-CM | POA: Diagnosis present

## 2017-04-12 DIAGNOSIS — Z7984 Long term (current) use of oral hypoglycemic drugs: Secondary | ICD-10-CM | POA: Diagnosis not present

## 2017-04-12 DIAGNOSIS — I10 Essential (primary) hypertension: Secondary | ICD-10-CM | POA: Insufficient documentation

## 2017-04-12 MED ORDER — KETOROLAC TROMETHAMINE 10 MG PO TABS: 10.0000 mg | ORAL_TABLET | Freq: Four times a day (QID) | ORAL | 0 refills | Status: AC | PRN

## 2017-04-12 MED ORDER — LIDOCAINE VISCOUS 2 % MT SOLN
15.0000 mL | Freq: Once | OROMUCOSAL | Status: AC
  Administered 2017-04-12: 15 mL via OROMUCOSAL
  Filled 2017-04-12: qty 15

## 2017-04-12 MED ORDER — AMOXICILLIN 500 MG PO CAPS: 500.0000 mg | ORAL_CAPSULE | Freq: Three times a day (TID) | ORAL | 0 refills | Status: AC

## 2017-04-12 MED ORDER — KETOROLAC TROMETHAMINE 30 MG/ML IJ SOLN
30.0000 mg | Freq: Once | INTRAMUSCULAR | Status: AC
Start: 2017-04-12 — End: 2017-04-12
  Administered 2017-04-12: 30 mg via INTRAMUSCULAR
  Filled 2017-04-12: qty 1

## 2017-04-12 MED ORDER — TRAMADOL HCL 50 MG PO TABS
50.0000 mg | ORAL_TABLET | Freq: Once | ORAL | Status: AC
  Administered 2017-04-12: 50 mg via ORAL
  Filled 2017-04-12: qty 1

## 2017-04-12 MED ORDER — AMOXICILLIN 500 MG PO CAPS
500.0000 mg | ORAL_CAPSULE | Freq: Once | ORAL | Status: AC
  Administered 2017-04-12: 500 mg via ORAL
  Filled 2017-04-12: qty 1

## 2017-04-12 MED ORDER — TRAMADOL HCL 50 MG PO TABS: 50.0000 mg | ORAL_TABLET | Freq: Three times a day (TID) | ORAL | 0 refills | Status: AC | PRN

## 2017-04-12 NOTE — ED Triage Notes (Signed)
Patient presents to the ED with dental pain since yesterday.

## 2017-04-12 NOTE — ED Provider Notes (Signed)
[facility] Emergency Department Provider Note  ____________________________________________  Time seen: Approximately 4:02 PM  I have reviewed the triage vital signs and the nursing notes.   HISTORY  Chief Complaint Dental Pain    HPI ANOTHY BUFANO is a 41 y.o. male presenting with acute 10 out of 10 dental pain that started yesterday. Patient states that he has numerous dental caries in the distribution of superior 14, 15 and 16. Patient states that he has been unable to obtain an appointment with a local dentist given the weekend. Patient states that his blood pressure is usually approximately 140/90. Patient adears to pharmacologic regimen for essential hypertension. Patient states that his blood pressure has been elevated since dental pain started. Patient denies associated chest pain, chest tightness, nausea, vomiting or abdominal pain.   Past Medical History:  Diagnosis Date  . Hypertension   . Nasal congestion 10/03/2014  . Non-insulin dependent type 2 diabetes mellitus (HCC)   . Obesity   . Sleep apnea    uses CPAP nightly  . Wears contact lenses     Patient Active Problem List   Diagnosis Date Noted  . Low testosterone 08/26/2016  . Screening for prostate cancer 08/26/2016  . RUQ abdominal pain 08/19/2016  . Lower abdominal pain 08/19/2016  . Chest wall pain 08/19/2016  . Decreased energy 08/19/2016  . Fatigue 08/19/2016  . Fatty liver 08/19/2016  . Need for prophylactic vaccination and inoculation against influenza 08/19/2016  . Hypokalemia 04/09/2016  . Encounter for health maintenance examination in adult 07/05/2015  . Diabetes mellitus type 2 in obese (HCC) 07/05/2015  . Essential hypertension 07/05/2015  . OSA on CPAP 07/05/2015  . S/P cholecystectomy 07/05/2015  . Adverse effects of medication 07/05/2015  . Noncompliance 07/05/2015  . Vaccine refused by patient 07/05/2015  . Morbid obesity (HCC) 07/05/2015    Past Surgical  History:  Procedure Laterality Date  . CHOLECYSTECTOMY N/A 10/09/2014   Procedure: LAPAROSCOPIC CHOLECYSTECTOMY;  Surgeon: [name], MD;  Location: [facility];  Service: General;  Laterality: N/A;    Prior to Admission medications   Medication Sig Start Date End Date Taking? Authorizing Provider  amLODipine (NORVASC) 10 MG tablet Take 1 tablet (10 mg total) by mouth daily. 04/09/16   [name], [name], PA-C  amoxicillin (AMOXIL) 500 MG capsule Take 1 capsule (500 mg total) by mouth 3 (three) times daily. 04/12/17 04/22/17  Orvil Feil, PA-C  benazepril-hydrochlorthiazide (LOTENSIN HCT) 20-12.5 MG tablet Take 2 tablets by mouth daily. 04/09/16   [name], [name], PA-C  benazepril-hydrochlorthiazide (LOTENSIN HCT) 20-12.5 MG tablet TAKE 2 TABLETS DAILY (NEEDS APPOINTMENT FOR ANY MORE REFILLS) 01/28/17   [name], [name], PA-C  ciprofloxacin (CIPRO) 500 MG tablet Take 1 tablet (500 mg total) by mouth 2 (two) times daily. 09/11/16   [name], [name], PA-C  Dulaglutide (TRULICITY) 1.5 MG/0.5ML SOPN Inject 1.8 mg into the skin once a week. 10/30/16   [name], [name], PA-C  Dulaglutide (TRULICITY) 1.5 MG/0.5ML SOPN Inject 1.8 mg into the skin once a week. 10/29/16   [name], [name], PA-C  ketorolac (TORADOL) 10 MG tablet Take 1 tablet (10 mg total) by mouth every 6 (six) hours as needed. 04/12/17 04/17/17  Orvil Feil, PA-C  potassium chloride SA (K-DUR,KLOR-CON) 20 MEQ tablet Take 1 tablet (20 mEq total) by mouth 2 (two) times daily. 04/09/16   [name], [name], PA-C  pravastatin (PRAVACHOL) 20 MG tablet Take 1 tablet (20 mg total) by mouth  daily. 04/09/16   [name], [name], PA-C  sitaGLIPtin-metformin (JANUMET) 50-1000 MG tablet Take 1 tablet by mouth 2 (two) times daily with a meal. 08/26/16   [name], [name], PA-C  tamsulosin (FLOMAX) 0.4 MG CAPS capsule Take 1 capsule (0.4 mg total) by mouth daily after supper. 09/11/16   [name], [name], PA-C  testosterone cypionate  (DEPOTESTOSTERONE CYPIONATE) 200 MG/ML injection Inject 1 mL (200 mg total) into the muscle every 14 (fourteen) days. 08/27/16   [name], [name], PA-C  traMADol (ULTRAM) 50 MG tablet Take 1 tablet (50 mg total) by mouth 3 (three) times daily between meals as needed. 04/12/17 04/15/17  Orvil Feil, PA-C    Allergies Patient has no known allergies.  Family History  Problem Relation Age of Onset  . Hypertension Mother   . Kidney disease Father   . Diabetes Maternal Aunt   . Diabetes Paternal Aunt   . Cancer Maternal Grandfather        throat  . Stroke Paternal Grandmother   . Alcohol abuse Paternal Grandmother   . Heart disease Neg Hx     Social History Social History  Substance Use Topics  . Smoking status: Never Smoker  . Smokeless tobacco: Never Used  . Alcohol use Yes     Comment: occasional     Review of Systems  Constitutional: No fever/chills Eyes: No visual changes. No discharge ENT: patient has dental pain. Cardiovascular: no chest pain. Respiratory: no cough. No SOB. Gastrointestinal: No abdominal pain.  No nausea, no vomiting.  No diarrhea.  No constipation. Musculoskeletal: Negative for musculoskeletal pain. Neurological: Negative for headaches, focal weakness or numbness.  ____________________________________________   PHYSICAL EXAM:  VITAL SIGNS: ED Triage Vitals  Enc Vitals Group     BP 04/12/17 1518 (!) 162/101     Pulse Rate 04/12/17 1518 (!) 105     Resp 04/12/17 1518 18     Temp 04/12/17 1518 98.3 F (36.8 C)     Temp Source 04/12/17 1518 Oral     SpO2 04/12/17 1518 95 %     Weight 04/12/17 1519 (!) 315 lb (142.9 kg)     Height 04/12/17 1519 5\' 9"  (1.753 m)     Head Circumference --      Peak Flow --      Pain Score 04/12/17 1518 8     Pain Loc --      Pain Edu? --      Excl. in GC? --      Constitutional: Alert and oriented. Well appearing and in no acute distress. Eyes: Conjunctivae are normal. PERRL. EOMI. Head:  Atraumatic. ENT:      Mouth/Throat: Patient has numerous dental caries along the distribution of superior 14, 15 and 16. No palpable edema along the jaw. Neck: Full range of motion   Cardiovascular: Normal rate, regular rhythm. Normal S1 and S2.  Good peripheral circulation. Respiratory: Normal respiratory effort without tachypnea or retractions. Lungs CTAB. Good air entry to the bases with no decreased or absent breath sounds. Neurologic:  Normal speech and language. No gross focal neurologic deficits are appreciated.  Skin:  Skin is warm, dry and intact. No rash noted.   ____________________________________________   LABS (all labs ordered are listed, but only abnormal results are displayed)  Labs Reviewed - No data to display ____________________________________________  EKG   ____________________________________________  RADIOLOGY  No results found.  ____________________________________________    PROCEDURES  Procedure(s) performed:    Procedures  Medications  ketorolac (TORADOL) 30 MG/ML injection 30 mg (not administered)  traMADol (ULTRAM) tablet 50 mg (not administered)  lidocaine (XYLOCAINE) 2 % viscous mouth solution 15 mL (not administered)  amoxicillin (AMOXIL) capsule 500 mg (not administered)     ____________________________________________   INITIAL IMPRESSION / ASSESSMENT AND PLAN / ED COURSE  Pertinent labs & imaging results that were available during my care of the patient were reviewed by me and considered in my medical decision making (see chart for details).  Review of the [place] CSRS was performed in accordance of the [facility] prior to dispensing any controlled drugs.    Assessment and plan: Dental pain: Patient presents to the emergency department with 10 out of 10 dental pain that started yesterday. Patient was given an injection of Toradol in the emergency department along with tramadol, viscous lidocaine and his first dose of amoxicillin.  Patient was discharged with Toradol, tramadol and amoxicillin. Patient was advised to seek care with a dentist immediately. Patient voiced understanding regarding this recommendation. He also was advised to follow-up with his primary care provider if blood pressure remains elevated. All patient questions were answered.    ____________________________________________  FINAL CLINICAL IMPRESSION(S) / ED DIAGNOSES  Final diagnoses:  Pain, dental      NEW MEDICATIONS STARTED DURING THIS VISIT:  New Prescriptions   AMOXICILLIN (AMOXIL) 500 MG CAPSULE    Take 1 capsule (500 mg total) by mouth 3 (three) times daily.   KETOROLAC (TORADOL) 10 MG TABLET    Take 1 tablet (10 mg total) by mouth every 6 (six) hours as needed.   TRAMADOL (ULTRAM) 50 MG TABLET    Take 1 tablet (50 mg total) by mouth 3 (three) times daily between meals as needed.        This chart was dictated using voice recognition software/Dragon. Despite best efforts to proofread, errors can occur which can change the meaning. Any change was purely unintentional.    Orvil FeilWoods, Robyn Galati M, PA-C 04/12/17 1609    Phineas SemenGoodman, [place], MD 04/12/17 [phone]

## 2017-04-12 NOTE — ED Notes (Signed)

## 2017-04-12 NOTE — Discharge Instructions (Signed)
OPTIONS FOR DENTAL FOLLOW UP CARE ° °[place] Department of Health and Human Services - Local Safety Net Dental Clinics °http://www.ncdhhs.gov/dph/oralhealth/services/safetynetclinics.htm °  °[facility] (336-562-3123) ° °[facility] [place] (919-933-9087) ° °[facility] ([phone] ext [phone]) ° °[facility] ([phone]) ° °[facility] ([phone]) °This clinic caters to the indigent population and is on a lottery system. °Location: °[facility], [address], [place] °Clinic Hours: °Wednesdays from 6pm - 9pm, patients seen by a lottery system. °For dates, call or go to www.med.[facility].edu/[facility]/patients/Dental-[facility] °Services: °Cleanings, fillings and simple extractions. °Payment Options: °DENTAL WORK IS FREE OF CHARGE. Bring proof of income or support. °Best way to get seen: °Arrive at 5:15 pm - this is a lottery, NOT first come/first serve, so arriving earlier will not increase your chances of being seen. °  °  °[facility] °[phone] °Select option 1 for emergencies °  °Location: °[facility], [address], [place] °Clinic Hours: °No walk-ins accepted - call the day before to schedule an appointment. °Check in times are 9:30 am and 1:30 pm. °Services: °Simple extractions, temporary fillings, pulpectomy/pulp debridement, uncomplicated abscess drainage. °Payment Options: °PAYMENT IS DUE AT THE TIME OF SERVICE.  Fee is usually $100-200, additional surgical procedures (e.g. abscess drainage) may be extra. °Cash, checks, Visa/MasterCard accepted.  Can file Medicaid if patient is covered for dental - patient should call case worker to check. °No discount for [facility] patients. °Best way to get seen: °MUST call the day before and get onto the schedule. Can usually be seen the next 1-2 days. No walk-ins accepted. °  °  °[facility] °[phone] °   °Location: °[facility], [address], [place] °Clinic Hours: °M, W, Th, F 8am or 1:30pm, Tues 9a or 1:30 - first come/first served. °Services: °Simple extractions, temporary fillings, uncomplicated abscess drainage.  You do not need to be an [place] resident. °Payment Options: °PAYMENT IS DUE AT THE TIME OF SERVICE. °Dental insurance, otherwise sliding scale - bring proof of income or support. °Depending on income and treatment needed, cost is usually $50-200. °Best way to get seen: °Arrive early as it is first come/first served. °  °  °[facility] °[phone] °  °Location: °[address] °Clinic Hours: °Mon-Thu 8a-5p °Services: °Most basic dental services including extractions and fillings. °Payment Options: °PAYMENT IS DUE AT THE TIME OF SERVICE. °Sliding scale, up to 50% off - bring proof if income or support. °Medicaid with dental option accepted. °Best way to get seen: °Call to schedule an appointment, can usually be seen within 2 weeks OR they will try to see walk-ins - show up at 8a or 2p (you may have to wait). °  °  °[facility] °919-245-2435 °[place] RESIDENTS ONLY °  °Location: °[facility], [address], [place], [place] [address] °Clinic Hours: By appointment only. °Monday - Thursday 8am-5pm, Friday 8am-12pm °Services: Cleanings, fillings, extractions. °Payment Options: °PAYMENT IS DUE AT THE TIME OF SERVICE. °Cash, Visa or MasterCard. Sliding scale - $30 minimum per service. °Best way to get seen: °Come in to office, complete packet and make an appointment - need proof of income °or support monies for each household member and proof of [place] residence. °Usually takes about a month to get in. °  °  °[facility] °[phone] °  °Location: °[address]   [place] °Clinic Hours: Walk-in Urgent Care Dental Services are offered Monday-Friday  mornings only. °The numbers of emergencies accepted daily is limited to the number of °providers available. °Maximum 15 - Mondays, Wednesdays & Thursdays °Maximum 10 - Tuesdays & Fridays °Services: °You do not need to be a [place] resident to be seen for a dental emergency. °Emergencies are defined as pain, swelling, abnormal bleeding, or dental trauma. Walkins will receive x-rays if needed. °NOTE: Dental cleaning is not an emergency. °Payment Options: °PAYMENT IS DUE AT THE TIME OF SERVICE. °Minimum co-pay is $40.00 for uninsured patients. °Minimum co-pay is $3.00 for Medicaid with dental coverage. °Dental Insurance is accepted and must be presented at time of visit. °Medicare does not cover dental. °Forms of payment: Cash, credit card, checks. °Best way to get seen: °If not previously registered with the clinic, walk-in dental registration begins at 7:15 am and is on a first come/first serve basis. °If previously registered with the clinic, call to make an appointment. °  °  °[facility] °[phone] °[place] RESIDENTS ONLY °  °Location: °[address], [place], [place] °Clinic Hours: °Mon-Thu 10a-2p °Services: Extractions only! °Payment Options: °FREE (donations accepted) - bring proof of income or support °Best way to get seen: °Call and schedule an appointment OR come at 8am on the 1st Monday of every month (except for holidays) when it is first come/first served. °  °  °[facility] °[phone] °  °Location: °[address], [place] °Clinic Hours: °Friday mornings °Services, Payment Options, Best way to get seen: °Call for info °

## 2017-05-14 ENCOUNTER — Other Ambulatory Visit: Payer: Self-pay | Admitting: Medical

## 2017-05-15 ENCOUNTER — Telehealth: Payer: Self-pay | Admitting: Medical

## 2017-05-15 NOTE — Telephone Encounter (Signed)
P.A. Trudee KusterRULICITY PEN

## 2017-05-19 NOTE — Telephone Encounter (Signed)
PA approved 04/15/2017-05/14/2020. Trixie Rude/RLB

## 2017-05-25 ENCOUNTER — Encounter: Payer: Self-pay | Admitting: Medical

## 2017-05-25 ENCOUNTER — Ambulatory Visit (INDEPENDENT_AMBULATORY_CARE_PROVIDER_SITE_OTHER): Payer: Managed Care, Other (non HMO) | Admitting: Medical

## 2017-05-25 VITALS — BP 124/86 | HR 93 | Ht 67.5 in | Wt 312.0 lb

## 2017-05-25 DIAGNOSIS — Z9049 Acquired absence of other specified parts of digestive tract: Secondary | ICD-10-CM

## 2017-05-25 DIAGNOSIS — Z7189 Other specified counseling: Secondary | ICD-10-CM | POA: Diagnosis not present

## 2017-05-25 DIAGNOSIS — E088 Diabetes mellitus due to underlying condition with unspecified complications: Secondary | ICD-10-CM | POA: Diagnosis not present

## 2017-05-25 DIAGNOSIS — E291 Testicular hypofunction: Secondary | ICD-10-CM | POA: Diagnosis not present

## 2017-05-25 DIAGNOSIS — K760 Fatty (change of) liver, not elsewhere classified: Secondary | ICD-10-CM | POA: Diagnosis not present

## 2017-05-25 DIAGNOSIS — Z7185 Encounter for immunization safety counseling: Secondary | ICD-10-CM | POA: Insufficient documentation

## 2017-05-25 DIAGNOSIS — Z9119 Patient's noncompliance with other medical treatment and regimen: Secondary | ICD-10-CM

## 2017-05-25 DIAGNOSIS — Z23 Encounter for immunization: Secondary | ICD-10-CM | POA: Diagnosis not present

## 2017-05-25 DIAGNOSIS — Z0000 Encounter for general adult medical examination without abnormal findings: Secondary | ICD-10-CM

## 2017-05-25 DIAGNOSIS — R7989 Other specified abnormal findings of blood chemistry: Secondary | ICD-10-CM

## 2017-05-25 DIAGNOSIS — G4733 Obstructive sleep apnea (adult) (pediatric): Secondary | ICD-10-CM | POA: Diagnosis not present

## 2017-05-25 DIAGNOSIS — Z91199 Patient's noncompliance with other medical treatment and regimen due to unspecified reason: Secondary | ICD-10-CM

## 2017-05-25 DIAGNOSIS — I10 Essential (primary) hypertension: Secondary | ICD-10-CM | POA: Diagnosis not present

## 2017-05-25 DIAGNOSIS — Z9989 Dependence on other enabling machines and devices: Secondary | ICD-10-CM

## 2017-05-25 LAB — POCT URINALYSIS DIP (PROADVANTAGE DEVICE)
Bilirubin, UA: NEGATIVE
Blood, UA: NEGATIVE
Glucose, UA: NEGATIVE mg/dL
Ketones, POC UA: NEGATIVE mg/dL
LEUKOCYTES UA: NEGATIVE
Nitrite, UA: NEGATIVE
Specific Gravity, Urine: 1.03
UUROB: NEGATIVE
pH, UA: 6 (ref 5.0–8.0)

## 2017-05-25 LAB — LIPID PANEL
CHOL/HDL RATIO: 4.9 ratio (ref <5.0)
Cholesterol: 148 mg/dL (ref <200)
HDL: 30 mg/dL — ABNORMAL LOW (ref >40)
LDL Cholesterol: 93 mg/dL (ref <100)
Triglycerides: 125 mg/dL (ref <150)
VLDL: 25 mg/dL (ref <30)

## 2017-05-25 LAB — COMPREHENSIVE METABOLIC PANEL
ALT: 19 U/L (ref 9–46)
AST: 14 U/L (ref 10–40)
Albumin: 4.2 g/dL (ref 3.6–5.1)
Alkaline Phosphatase: 49 U/L (ref 40–115)
BILIRUBIN TOTAL: 1.2 mg/dL (ref 0.2–1.2)
BUN: 13 mg/dL (ref 7–25)
CALCIUM: 8.7 mg/dL (ref 8.6–10.3)
CO2: 27 mmol/L (ref 20–31)
Chloride: 103 mmol/L (ref 98–110)
Creat: 0.99 mg/dL (ref 0.60–1.35)
GLUCOSE: 91 mg/dL (ref 65–99)
Potassium: 3 mmol/L — ABNORMAL LOW (ref 3.5–5.3)
Sodium: 141 mmol/L (ref 135–146)
Total Protein: 6.6 g/dL (ref 6.1–8.1)

## 2017-05-25 LAB — CBC
HCT: 40.8 % (ref 38.5–50.0)
HEMOGLOBIN: 14 g/dL (ref 13.2–17.1)
MCH: 29.4 pg (ref 27.0–33.0)
MCHC: 34.3 g/dL (ref 32.0–36.0)
MCV: 85.7 fL (ref 80.0–100.0)
MPV: 9.9 fL (ref 7.5–12.5)
Platelets: 182 10*3/uL (ref 140–400)
RBC: 4.76 MIL/uL (ref 4.20–5.80)
RDW: 14.6 % (ref 11.0–15.0)
WBC: 3.9 10*3/uL — ABNORMAL LOW (ref 4.0–10.5)

## 2017-05-25 LAB — T4, FREE: Free T4: 1.2 ng/dL (ref 0.8–1.8)

## 2017-05-25 LAB — TSH: TSH: 0.82 mIU/L (ref 0.40–4.50)

## 2017-05-25 MED ORDER — POTASSIUM CHLORIDE CRYS ER 20 MEQ PO TBCR: 20.0000 meq | EXTENDED_RELEASE_TABLET | Freq: Two times a day (BID) | ORAL | 3 refills | Status: DC

## 2017-05-25 MED ORDER — BENAZEPRIL-HYDROCHLOROTHIAZIDE 20-12.5 MG PO TABS: 2.0000 | ORAL_TABLET | Freq: Every day | ORAL | 3 refills | Status: DC

## 2017-05-25 MED ORDER — PRAVASTATIN SODIUM 20 MG PO TABS: 20.0000 mg | ORAL_TABLET | Freq: Every day | ORAL | 3 refills | Status: DC

## 2017-05-25 MED ORDER — AMLODIPINE BESYLATE 10 MG PO TABS: 10.0000 mg | ORAL_TABLET | Freq: Every day | ORAL | 3 refills | Status: DC

## 2017-05-25 MED ORDER — TAMSULOSIN HCL 0.4 MG PO CAPS: 0.4000 mg | ORAL_CAPSULE | Freq: Every day | ORAL | 3 refills | Status: DC

## 2017-05-25 NOTE — Addendum Note (Signed)
Addended by: Winn JockVALENTINE, Ronnett Pullin N on: 05/25/2017 03:23 PM   Modules accepted: Orders

## 2017-05-25 NOTE — Patient Instructions (Addendum)
Encounter Diagnoses  Name Primary?  . Encounter for health maintenance examination in adult Yes  . Essential hypertension   . OSA on CPAP   . Fatty liver   . Morbid obesity (HCC)   . Low testosterone   . Diabetes mellitus due to underlying condition with complication, without long-term current use of insulin (HCC)   . S/P cholecystectomy   . Hypogonadism in male   . Noncompliance   . Vaccine counseling   . Need for Tdap vaccination   . Need for pneumococcal vaccination    Recommendations:  See your eye doctor yearly for routine vision care.  Make sure your eye doctor sends us a copy of your eye exam.  Ask for a diabetes retinopathy exam yearly  See your dentist yearly for routine dental care including hygiene visits twice yearly.  I recommend 30-45 minutes of exercise most days per week such as jogging, bicycling, swimming, or other aerobic exercise  Get some weight bearing exercise regularly as well  Eat a healthy low fat diet  See us at least twice yearly regarding diabetes follow up  Continue efforts to lose weight, to maintain a health active lifestyle  We updated your Tdap (tetanus diptheria and pertussis vaccine, and pneumococcal vaccines today    Fatty Liver Fatty liver, also called hepatic steatosis or steatohepatitis, is a condition in which too much fat has built up in your liver cells. The liver removes harmful substances from your bloodstream. It produces fluids your body needs. It also helps your body use and store energy from the food you eat. In many cases, fatty liver does not cause symptoms or problems. It is often diagnosed when tests are being done for other reasons. However, over time, fatty liver can cause inflammation that may lead to more serious liver problems, such as scarring of the liver (cirrhosis). What are the causes? Causes of fatty liver may include:  Drinking too much alcohol.  Poor nutrition.  Obesity.  Cushing  syndrome.  Diabetes.  Hyperlipidemia.  Pregnancy.  Certain drugs.  Poisons.  Some viral infections.  What increases the risk? You may be more likely to develop fatty liver if you:  Abuse alcohol.  Are pregnant.  Are overweight.  Have diabetes.  Have hepatitis.  Have a high triglyceride level.  What are the signs or symptoms? Fatty liver often does not cause any symptoms. In cases where symptoms develop, they can include:  Fatigue.  Weakness.  Weight loss.  Confusion.  Abdominal pain.  Yellowing of your skin and the white parts of your eyes (jaundice).  Nausea and vomiting.  How is this diagnosed? Fatty liver may be diagnosed by:  Physical exam and medical history.  Blood tests.  Imaging tests, such as an ultrasound, CT scan, or MRI.  Liver biopsy. A small sample of liver tissue is removed using a needle. The sample is then looked at under a microscope.  How is this treated? Fatty liver is often caused by other health conditions. Treatment for fatty liver may involve medicines and lifestyle changes to manage conditions such as:  Alcoholism.  High cholesterol.  Diabetes.  Being overweight or obese.  Follow these instructions at home:  Eat a healthy diet as directed by your health care provider.  Exercise regularly. This can help you lose weight and control your cholesterol and diabetes. Talk to your health care provider about an exercise plan and which activities are best for you.  Do not drink alcohol.  Take medicines only  as directed by your health care provider. Contact a health care provider if: You have difficulty controlling your:  Blood sugar.  Cholesterol.  Alcohol consumption.  Get help right away if:  You have abdominal pain.  You have jaundice.  You have nausea and vomiting. This information is not intended to replace advice given to you by your health care provider. Make sure you discuss any questions you have with  your health care provider. Document Released: 12/05/2005 Document Revised: 03/27/2016 Document Reviewed: 03/01/2014 Elsevier Interactive Patient Education  2018 Elsevier Inc.   Sleep Apnea Sleep apnea is a condition that affects breathing. People with sleep apnea have moments during sleep when their breathing pauses briefly or gets shallow. Sleep apnea can cause these symptoms:  Trouble staying asleep.  Sleepiness or tiredness during the day.  Irritability.  Loud snoring.  Morning headaches.  Trouble concentrating.  Forgetting things.  Less interest in sex.  Being sleepy for no reason.  Mood swings.  Personality changes.  Depression.  Waking up a lot during the night to pee (urinate).  Dry mouth.  Sore throat.  Follow these instructions at home:  Make any changes in your routine that your doctor recommends.  Eat a healthy, well-balanced diet.  Take over-the-counter and prescription medicines only as told by your doctor.  Avoid using alcohol, calming medicines (sedatives), and narcotic medicines.  Take steps to lose weight if you are overweight.  If you were given a machine (device) to use while you sleep, use it only as told by your doctor.  Do not use any tobacco products, such as cigarettes, chewing tobacco, and e-cigarettes. If you need help quitting, ask your doctor.  Keep all follow-up visits as told by your doctor. This is important. Contact a doctor if:  The machine that you were given to use during sleep is uncomfortable or does not seem to be working.  Your symptoms do not get better.  Your symptoms get worse. Get help right away if:  Your chest hurts.  You have trouble breathing in enough air (shortness of breath).  You have an uncomfortable feeling in your back, arms, or stomach.  You have trouble talking.  One side of your body feels weak.  A part of your face is hanging down (drooping). These symptoms may be an emergency. Do not  wait to see if the symptoms will go away. Get medical help right away. Call your local emergency services (911 in the [place] Do not drive yourself to the hospital. This information is not intended to replace advice given to you by your health care provider. Make sure you discuss any questions you have with your health care provider. Document Released: 07/29/2008 Document Revised: 06/15/2016 Document Reviewed: 07/30/2015 Elsevier Interactive Patient Education  [employer].

## 2017-05-25 NOTE — Progress Notes (Signed)
Subjective:   HPI  [name] is a 41 y.o. male who presents for physical Chief Complaint  Patient presents with  . Annual Exam    physical, no other concerns     Medical care team includes: Easter Kennebrew, [name], PA-C here for primary care Dentist Eye doctor  Concerns: Exercise  - doing some walking, but not a lot.  Diet - tries to not overeat.  Diabetes - Not checking sugars regularly, but does check occasionally.    Ran out of Janumet.   Is taking Trulicity weekly  HTN - is compliant with BP medication  Was taking Flomax for urinary stream issues but ran out.  Not currently having urinary concerns.   Reviewed their medical, surgical, family, social, medication, and allergy history and updated chart as appropriate.  Past Medical History:  Diagnosis Date  . Hypertension   . Nasal congestion 10/03/2014  . Non-insulin dependent type 2 diabetes mellitus (HCC)   . Obesity   . Sleep apnea    uses CPAP nightly  . Wears contact lenses     Past Surgical History:  Procedure Laterality Date  . CHOLECYSTECTOMY N/A 10/09/2014   Procedure: LAPAROSCOPIC CHOLECYSTECTOMY;  Surgeon: [name], MD;  Location: [facility];  Service: General;  Laterality: N/A;    Social History   Social History  . Marital status: Married    Spouse name: N/A  . Number of children: N/A  . Years of education: N/A   Occupational History  . Not on file.   Social History Main Topics  . Smoking status: Never Smoker  . Smokeless tobacco: Never Used  . Alcohol use Yes     Comment: occasional  . Drug use: No  . Sexual activity: Not on file   Other Topics Concern  . Not on file   Social History Narrative   Married, has 10yo boy.  Wife has 2 children, so 3 in the household.  Exercise some with walking.  Works as Teacher, early years/pre for [employer].          Family History  Problem Relation Age of Onset  . Hypertension Mother   . Kidney disease Father   . Diabetes Maternal  Aunt   . Diabetes Paternal Aunt   . Cancer Maternal Grandfather        throat  . Stroke Paternal Grandmother   . Alcohol abuse Paternal Grandmother   . Heart disease Neg Hx      Current Outpatient Prescriptions:  .  benazepril-hydrochlorthiazide (LOTENSIN HCT) 20-12.5 MG tablet, Take 2 tablets by mouth daily., Disp: 180 tablet, Rfl: 3 .  Dulaglutide (TRULICITY) 1.5 MG/0.5ML SOPN, Inject 1.8 mg into the skin once a week., Disp: 0.5 mL, Rfl: 11 .  potassium chloride SA (K-DUR,KLOR-CON) 20 MEQ tablet, Take 1 tablet (20 mEq total) by mouth 2 (two) times daily., Disp: 180 tablet, Rfl: 3 .  tamsulosin (FLOMAX) 0.4 MG CAPS capsule, Take 1 capsule (0.4 mg total) by mouth daily after supper., Disp: 90 capsule, Rfl: 3 .  pravastatin (PRAVACHOL) 20 MG tablet, Take 1 tablet (20 mg total) by mouth daily., Disp: 90 tablet, Rfl: 3  No Known Allergies     Review of Systems Constitutional: -fever, -chills, -sweats, -unexpected weight change, -decreased appetite, -fatigue Allergy: -sneezing, -itching, -congestion Dermatology: -changing moles, --rash, -lumps ENT: -runny nose, -ear pain, -sore throat, -hoarseness, -sinus pain, -teeth pain, - ringing in ears, -hearing loss, -nosebleeds Cardiology: -chest pain, -palpitations, -swelling, -difficulty breathing when lying flat, -waking  up short of breath Respiratory: -cough, -shortness of breath, -difficulty breathing with exercise or exertion, -wheezing, -coughing up blood Gastroenterology: -abdominal pain, -nausea, -vomiting, -diarrhea, -constipation, -blood in stool, -changes in bowel movement, -difficulty swallowing or eating Hematology: -bleeding, -bruising  Musculoskeletal: -joint aches, -muscle aches, -joint swelling, -back pain, -neck pain, -cramping, -changes in gait Ophthalmology: denies vision changes, eye redness, itching, discharge Urology: -burning with urination, -difficulty urinating, -blood in urine, -urinary frequency, -urgency,  -incontinence Neurology: -headache, -weakness, -tingling, -numbness, -memory loss, -falls, -dizziness Psychology: -depressed mood, -agitation, -sleep problems     Objective:   BP 124/86   Pulse 93   Ht 5' 7.5" (1.715 m)   Wt (!) 312 lb (141.5 kg)   SpO2 98%   BMI 48.14 kg/m   General appearance: alert, no distress, WD/WN, African American male Skin: unremarkable HEENT: normocephalic, conjunctiva/corneas normal, sclerae anicteric, PERRLA, EOMi, nares patent, no discharge or erythema, pharynx normal Oral cavity: MMM, tongue normal, teeth normal Neck: supple, no lymphadenopathy, no thyromegaly, no masses, normal ROM, no bruits Chest: non tender, normal shape and expansion Heart: RRR, normal S1, S2, no murmurs Lungs: CTA bilaterally, no wheezes, rhonchi, or rales Abdomen: +bs, soft, non tender, non distended, no masses, no hepatomegaly, no splenomegaly, no bruits Back: non tender, normal ROM, no scoliosis Musculoskeletal: upper extremities non tender, no obvious deformity, normal ROM throughout, lower extremities non tender, no obvious deformity, normal ROM throughout Extremities: no edema, no cyanosis, no clubbing Pulses: 2+ symmetric, upper and lower extremities, normal cap refill Neurological: alert, oriented x 3, CN2-12 intact, strength normal upper extremities and lower extremities, sensation normal throughout, DTRs 2+ throughout, no cerebellar signs, gait normal Psychiatric: normal affect, behavior normal, pleasant  GU: normal male external genitalia,circumcised, nontender, no masses, no hernia, no lymphadenopathy Rectal: deferred  Diabetic Foot Exam - Simple   Simple Foot Form Diabetic Foot exam was performed with the following findings:  Yes 05/25/2017  2:27 PM  Visual Inspection No deformities, no ulcerations, no other skin breakdown bilaterally:  Yes Sensation Testing Intact to touch and monofilament testing bilaterally:  Yes Pulse Check Posterior Tibialis and Dorsalis  pulse intact bilaterally:  Yes Comments      Assessment and Plan :    Encounter Diagnoses  Name Primary?  . Encounter for health maintenance examination in adult Yes  . Essential hypertension   . OSA on CPAP   . Fatty liver   . Morbid obesity (HCC)   . Low testosterone   . Diabetes mellitus due to underlying condition with complication, without long-term current use of insulin (HCC)   . S/P cholecystectomy   . Hypogonadism in male   . Noncompliance   . Vaccine counseling   . Need for Tdap vaccination   . Need for pneumococcal vaccination     Physical exam - discussed and counseled on healthy lifestyle, diet, exercise, preventative care, vaccinations, sick and well care, proper use of emergency dept and after hours care, and addressed their concerns.    Health screening: See your eye doctor yearly for routine vision care. See your dentist yearly for routine dental care including hygiene visits twice yearly.  Cancer screening Discussed colonoscopy screening age 66yo unless higher risk for earlier screening Discussed PSA, prostate exam, and prostate cancer screening risks/benefits.   Discussed prostate symptoms as well.  Prostate screening performed: PSA lab today Advised monthly self testicular exams  Vaccinations: Counseled on the following vaccines:  influenza, pneumococcal and Tdap. Counseled on the Tdap (tetanus, diptheria, and acellular pertussis)  vaccine.  Vaccine information sheet given. Tdap vaccine given after consent obtained. Counseled on the pneumococcal vaccine.  Vaccine information sheet given.  Pneumococcal vaccine PPSV23 given after consent obtained.  Acute issues discussed: none  Separate significant chronic issues discussed: Diabetes - discussed need for better compliance and f/u.  Labs today.   HTN - c/t same medication (amlodipine + benazepril HCT), home monitoring of BPs  Fatty liver disease - discussed significance of this and recommend to work  harder at [employer] diet, exercise, weight loss.  Restart Pravachol.  OSA - c/t CPAP  Low testosterone - not currently on therapy, ran out.   Repeat labs today  [name] was seen today for annual exam.  Diagnoses and all orders for this visit:  Encounter for health maintenance examination in adult -     Comprehensive metabolic panel -     CBC -     Lipid panel -     Hemoglobin A1c -     TSH -     T4, free  Essential hypertension  OSA on CPAP  Fatty liver -     Comprehensive metabolic panel -     Lipid panel  Morbid obesity (HCC) -     TSH -     T4, free  Low testosterone -     Testosterone -     PSA  Diabetes mellitus due to underlying condition with complication, without long-term current use of insulin (HCC) -     Hemoglobin A1c -     HM DIABETES EYE EXAM -     HM DIABETES FOOT EXAM -     Microalbumin / creatinine urine ratio  S/P cholecystectomy  Hypogonadism in male -     Testosterone -     PSA  Noncompliance  Vaccine counseling  Need for Tdap vaccination  Need for pneumococcal vaccination  Other orders -     Discontinue: amLODipine (NORVASC) 10 MG tablet; Take 1 tablet (10 mg total) by mouth daily. -     Discontinue: benazepril-hydrochlorthiazide (LOTENSIN HCT) 20-12.5 MG tablet; Take 2 tablets by mouth daily. -     Discontinue: potassium chloride SA (K-DUR,KLOR-CON) 20 MEQ tablet; Take 1 tablet (20 mEq total) by mouth 2 (two) times daily. -     Discontinue: tamsulosin (FLOMAX) 0.4 MG CAPS capsule; Take 1 capsule (0.4 mg total) by mouth daily after supper. -     Discontinue: pravastatin (PRAVACHOL) 20 MG tablet; Take 1 tablet (20 mg total) by mouth daily. -     benazepril-hydrochlorthiazide (LOTENSIN HCT) 20-12.5 MG tablet; Take 2 tablets by mouth daily. -     potassium chloride SA (K-DUR,KLOR-CON) 20 MEQ tablet; Take 1 tablet (20 mEq total) by mouth 2 (two) times daily. -     pravastatin (PRAVACHOL) 20 MG tablet; Take 1 tablet (20 mg total) by mouth  daily. -     tamsulosin (FLOMAX) 0.4 MG CAPS capsule; Take 1 capsule (0.4 mg total) by mouth daily after supper. -     Discontinue: amLODipine (NORVASC) 10 MG tablet; Take 1 tablet (10 mg total) by mouth daily.   Follow-up pending labs, yearly for physical

## 2017-05-25 NOTE — Addendum Note (Signed)
Addended by: Winn JockVALENTINE, Amara Manalang N on: 05/25/2017 02:56 PM   Modules accepted: Orders

## 2017-05-26 ENCOUNTER — Other Ambulatory Visit: Payer: Self-pay | Admitting: Medical

## 2017-05-26 LAB — PSA: PSA: 0.9 ng/mL (ref <=4.0)

## 2017-05-26 LAB — MICROALBUMIN / CREATININE URINE RATIO
Creatinine, Urine: 300 mg/dL (ref 20–370)
Microalb Creat Ratio: 14 mcg/mg creat (ref <30)
Microalb, Ur: 4.1 mg/dL

## 2017-05-26 LAB — TESTOSTERONE: Testosterone: 282 ng/dL (ref 250–827)

## 2017-05-26 LAB — HEMOGLOBIN A1C
HEMOGLOBIN A1C: 5.2 % (ref <5.7)
Mean Plasma Glucose: 103 mg/dL

## 2017-05-26 MED ORDER — SITAGLIPTIN PHOS-METFORMIN HCL 50-1000 MG PO TABS: 1.0000 | ORAL_TABLET | Freq: Two times a day (BID) | ORAL | 3 refills | Status: DC

## 2017-05-26 MED ORDER — INSULIN DEGLUDEC-LIRAGLUTIDE 100-3.6 UNIT-MG/ML ~~LOC~~ SOPN: 25.0000 [IU] | PEN_INJECTOR | Freq: Every day | SUBCUTANEOUS | 2 refills | Status: DC

## 2017-05-26 MED ORDER — TESTOSTERONE CYPIONATE 200 MG/ML IM SOLN: 200.0000 mg | INTRAMUSCULAR | 2 refills | Status: DC

## 2017-05-26 MED ORDER — SEMAGLUTIDE (1 MG/DOSE) 2 MG/1.5ML ~~LOC~~ SOPN: 1.0000 mg | PEN_INJECTOR | SUBCUTANEOUS | 2 refills | Status: DC

## 2017-09-22 ENCOUNTER — Other Ambulatory Visit: Payer: Self-pay

## 2017-09-22 ENCOUNTER — Telehealth: Payer: Self-pay | Admitting: Medical

## 2017-09-22 MED ORDER — BENAZEPRIL-HYDROCHLOROTHIAZIDE 20-12.5 MG PO TABS: 2.0000 | ORAL_TABLET | Freq: Every day | ORAL | 0 refills | Status: DC

## 2017-09-22 NOTE — Telephone Encounter (Signed)
[facility] [address]

## 2017-09-22 NOTE — Telephone Encounter (Signed)
Sent in refill and made an appt for dm check.

## 2017-10-08 ENCOUNTER — Encounter: Payer: Self-pay | Admitting: Medical

## 2017-10-08 ENCOUNTER — Ambulatory Visit (INDEPENDENT_AMBULATORY_CARE_PROVIDER_SITE_OTHER): Payer: Managed Care, Other (non HMO) | Admitting: Medical

## 2017-10-08 VITALS — BP 132/82 | HR 84 | Wt 306.2 lb

## 2017-10-08 DIAGNOSIS — E785 Hyperlipidemia, unspecified: Secondary | ICD-10-CM

## 2017-10-08 DIAGNOSIS — R7989 Other specified abnormal findings of blood chemistry: Secondary | ICD-10-CM | POA: Diagnosis not present

## 2017-10-08 DIAGNOSIS — I10 Essential (primary) hypertension: Secondary | ICD-10-CM | POA: Diagnosis not present

## 2017-10-08 DIAGNOSIS — E1169 Type 2 diabetes mellitus with other specified complication: Secondary | ICD-10-CM

## 2017-10-08 DIAGNOSIS — E669 Obesity, unspecified: Secondary | ICD-10-CM | POA: Diagnosis not present

## 2017-10-08 DIAGNOSIS — Z2821 Immunization not carried out because of patient refusal: Secondary | ICD-10-CM

## 2017-10-08 DIAGNOSIS — E876 Hypokalemia: Secondary | ICD-10-CM

## 2017-10-08 DIAGNOSIS — E088 Diabetes mellitus due to underlying condition with unspecified complications: Secondary | ICD-10-CM

## 2017-10-08 LAB — COMPREHENSIVE METABOLIC PANEL
AG Ratio: 1.7 (calc) (ref 1.0–2.5)
ALKALINE PHOSPHATASE (APISO): 49 U/L (ref 40–115)
ALT: 20 U/L (ref 9–46)
AST: 13 U/L (ref 10–40)
Albumin: 4.2 g/dL (ref 3.6–5.1)
BILIRUBIN TOTAL: 1.5 mg/dL — AB (ref 0.2–1.2)
BUN: 15 mg/dL (ref 7–25)
CALCIUM: 9.2 mg/dL (ref 8.6–10.3)
CO2: 30 mmol/L (ref 20–32)
CREATININE: 1.04 mg/dL (ref 0.60–1.35)
Chloride: 100 mmol/L (ref 98–110)
Globulin: 2.5 g/dL (calc) (ref 1.9–3.7)
Glucose, Bld: 103 mg/dL — ABNORMAL HIGH (ref 65–99)
Potassium: 3.1 mmol/L — ABNORMAL LOW (ref 3.5–5.3)
Sodium: 139 mmol/L (ref 135–146)
Total Protein: 6.7 g/dL (ref 6.1–8.1)

## 2017-10-08 LAB — POCT GLYCOSYLATED HEMOGLOBIN (HGB A1C): HEMOGLOBIN A1C: 5.6

## 2017-10-08 LAB — LIPID PANEL
CHOL/HDL RATIO: 3.7 (calc) (ref <5.0)
CHOLESTEROL: 127 mg/dL (ref <200)
HDL: 34 mg/dL — AB (ref >40)
LDL CHOLESTEROL (CALC): 79 mg/dL
Non-HDL Cholesterol (Calc): 93 mg/dL (calc) (ref <130)
TRIGLYCERIDES: 55 mg/dL (ref <150)

## 2017-10-08 NOTE — Progress Notes (Signed)
Subjective: Chief Complaint  Patient presents with  . Diabetes    dm check    Here for diabetes med check.  He is a little annoyed that he has to be here today.  Last visit labs showed cholesterol too high, low potassium, low testosterone.  He is not taking testosterone, decided not to do this.  high blood pressure: Compliant with benazepril-hydrochlorothiazide (LOTENSIN HCT) 20-12.5 MG tablet daily   For low potassium:  Compliant with potassium chloride SA (K-DUR,KLOR-CON) 20 MEQ tablet daily   cholesterol: He is taking pravastatin (PRAVACHOL) 20 MG tablet daily at bedtime started back last visit  Diabetes-she is taking Janumet but never started the Ozpemic shot.  Since last visit he has lost some weight, really working better with diet and exercise.  He is having some issues with his CPAP machine which is 39 years old, may be interested in repeating a sleep study in January.  His last CPAP was set up through another office besides ours.  Past Medical History:  Diagnosis Date  . Hypertension   . Nasal congestion 10/03/2014  . Non-insulin dependent type 2 diabetes mellitus (HCC)   . Obesity   . Sleep apnea    uses CPAP nightly  . Wears contact lenses    Current Outpatient Medications on File Prior to Visit  Medication Sig Dispense Refill  . benazepril-hydrochlorthiazide (LOTENSIN HCT) 20-12.5 MG tablet Take 2 tablets by mouth daily. 60 tablet 0  . potassium chloride SA (K-DUR,KLOR-CON) 20 MEQ tablet Take 1 tablet (20 mEq total) by mouth 2 (two) times daily. 180 tablet 3  . pravastatin (PRAVACHOL) 20 MG tablet Take 1 tablet (20 mg total) by mouth daily. 90 tablet 3  . sitaGLIPtin-metformin (JANUMET) 50-1000 MG tablet Take 1 tablet by mouth 2 (two) times daily with a meal. 180 tablet 3  . tamsulosin (FLOMAX) 0.4 MG CAPS capsule Take 1 capsule (0.4 mg total) by mouth daily after supper. 90 capsule 3  . testosterone cypionate (DEPOTESTOSTERONE CYPIONATE) 200 MG/ML injection Inject  1 mL (200 mg total) into the muscle every 14 (fourteen) days. 10 mL 2   No current facility-administered medications on file prior to visit.    ROS as in subjective  Objective: BP 132/82   Pulse 84   Wt (!) 306 lb 3.2 oz (138.9 kg)   SpO2 97%   BMI 47.25 kg/m   Wt Readings from Last 3 Encounters:  10/08/17 (!) 306 lb 3.2 oz (138.9 kg)  05/25/17 (!) 312 lb (141.5 kg)  04/12/17 (!) 315 lb (142.9 kg)    General appearance: alert, no distress, WD/WN,  Oral cavity: MMM, no lesions Neck: supple, no lymphadenopathy, no thyromegaly, no masses Heart: RRR, normal S1, S2, no murmurs Lungs: CTA bilaterally, no wheezes, rhonchi, or rales Ext: no edema Skin: left upper back with large patch of brown thickened skin, unchanged since childhood per patient Pulses: 2+ symmetric, upper and lower extremities, normal cap refill      Assessment: Encounter Diagnoses  Name Primary?  . Diabetes mellitus type 2 in obese (HCC) Yes  . Diabetes mellitus due to underlying condition with complication, without long-term current use of insulin (HCC)   . Essential hypertension   . Low testosterone   . Hypokalemia   . Hyperlipidemia associated with type 2 diabetes mellitus (HCC)   . Influenza vaccination declined     Plan: Diabetes marker at goal.  I congratulated him on his weight loss and efforts.  I recommend he continue efforts at losing  more weight, continuing to eat healthy, exercise, and continue current medications  Fasting labs today since we started back on statin.  He will decline treatment for low testosterone at this time  Declines flu shot today   Ramon Dredgedward was seen today for diabetes.  Diagnoses and all orders for this visit:  Diabetes mellitus type 2 in obese (HCC) -     HgB A1c -     Comprehensive metabolic panel -     Lipid panel  Diabetes mellitus due to underlying condition with complication, without long-term current use of insulin (HCC)  Essential hypertension -      Comprehensive metabolic panel -     Lipid panel  Low testosterone  Hypokalemia  Hyperlipidemia associated with type 2 diabetes mellitus (HCC) -     Comprehensive metabolic panel -     Lipid panel  Influenza vaccination declined

## 2017-10-09 ENCOUNTER — Other Ambulatory Visit: Payer: Self-pay | Admitting: Medical

## 2017-10-09 MED ORDER — BENAZEPRIL-HYDROCHLOROTHIAZIDE 20-12.5 MG PO TABS: 2.0000 | ORAL_TABLET | Freq: Every day | ORAL | 3 refills | Status: DC

## 2017-11-09 ENCOUNTER — Other Ambulatory Visit: Payer: Self-pay

## 2017-11-09 MED ORDER — BENAZEPRIL-HYDROCHLOROTHIAZIDE 20-12.5 MG PO TABS: 2.0000 | ORAL_TABLET | Freq: Every day | ORAL | 1 refills | Status: DC

## 2017-11-17 ENCOUNTER — Encounter: Payer: Self-pay | Admitting: *Deleted

## 2017-11-17 ENCOUNTER — Other Ambulatory Visit: Payer: Self-pay

## 2017-11-17 DIAGNOSIS — E119 Type 2 diabetes mellitus without complications: Secondary | ICD-10-CM | POA: Diagnosis not present

## 2017-11-17 DIAGNOSIS — H04201 Unspecified epiphora, right lacrimal gland: Secondary | ICD-10-CM | POA: Insufficient documentation

## 2017-11-17 DIAGNOSIS — Z7984 Long term (current) use of oral hypoglycemic drugs: Secondary | ICD-10-CM | POA: Diagnosis not present

## 2017-11-17 DIAGNOSIS — I10 Essential (primary) hypertension: Secondary | ICD-10-CM | POA: Insufficient documentation

## 2017-11-17 DIAGNOSIS — E876 Hypokalemia: Secondary | ICD-10-CM | POA: Diagnosis not present

## 2017-11-17 DIAGNOSIS — G44009 Cluster headache syndrome, unspecified, not intractable: Secondary | ICD-10-CM | POA: Diagnosis not present

## 2017-11-17 DIAGNOSIS — R51 Headache: Secondary | ICD-10-CM | POA: Diagnosis present

## 2017-11-17 NOTE — ED Notes (Addendum)
Wife to desk to inquire over wait time and concerns over pt's BP; pt sitting in lobby with no distress noted; vs retaken and pt reports BP now at baseline with decreased HA now; pt st last 2 days has had severe HA behind right eye with photosensitivity; st has had HA in past with elevated BP; takes benzepril daily for BP; stressed to pt & wife importance of being evaluated; also recommended pt keep log of his BP at home to have his PCP eval his BP med efficacy

## 2017-11-17 NOTE — ED Triage Notes (Signed)
PT reports headache since last night. No changes in vision reported. No neuro deficits. No hx of migraines. Pt reports having checked BP at home and it read 160/111. Hx of HTN that pt reports he compliment with medications.

## 2017-11-18 ENCOUNTER — Emergency Department: Payer: Managed Care, Other (non HMO)

## 2017-11-18 ENCOUNTER — Emergency Department
Admission: EM | Admit: 2017-11-18 | Discharge: 2017-11-18 | Disposition: A | Discharge status: Home / Self Care | Payer: Managed Care, Other (non HMO) | Attending: Emergency Medicine | Admitting: Emergency Medicine

## 2017-11-18 ENCOUNTER — Encounter: Payer: Self-pay | Admitting: Radiology

## 2017-11-18 DIAGNOSIS — E876 Hypokalemia: Secondary | ICD-10-CM

## 2017-11-18 DIAGNOSIS — G44009 Cluster headache syndrome, unspecified, not intractable: Secondary | ICD-10-CM

## 2017-11-18 LAB — COMPREHENSIVE METABOLIC PANEL
ALK PHOS: 52 U/L (ref 38–126)
ALT: 24 U/L (ref 17–63)
ANION GAP: 13 (ref 5–15)
AST: 20 U/L (ref 15–41)
Albumin: 4.5 g/dL (ref 3.5–5.0)
BILIRUBIN TOTAL: 2.4 mg/dL — AB (ref 0.3–1.2)
BUN: 13 mg/dL (ref 6–20)
CALCIUM: 9.2 mg/dL (ref 8.9–10.3)
CO2: 28 mmol/L (ref 22–32)
CREATININE: 0.96 mg/dL (ref 0.61–1.24)
Chloride: 97 mmol/L — ABNORMAL LOW (ref 101–111)
GFR calc Af Amer: >60 mL/min (ref >60)
GFR calc non Af Amer: >60 mL/min (ref >60)
Glucose, Bld: 98 mg/dL (ref 65–99)
Potassium: 2.6 mmol/L — CL (ref 3.5–5.1)
SODIUM: 138 mmol/L (ref 135–145)
TOTAL PROTEIN: 7.5 g/dL (ref 6.5–8.1)

## 2017-11-18 LAB — CBC
HEMATOCRIT: 43.7 % (ref 40.0–52.0)
HEMOGLOBIN: 15.1 g/dL (ref 13.0–18.0)
MCH: 28.8 pg (ref 26.0–34.0)
MCHC: 34.5 g/dL (ref 32.0–36.0)
MCV: 83.3 fL (ref 80.0–100.0)
Platelets: 180 10*3/uL (ref 150–440)
RBC: 5.24 MIL/uL (ref 4.40–5.90)
RDW: 13.8 % (ref 11.5–14.5)
WBC: 4.6 10*3/uL (ref 3.8–10.6)

## 2017-11-18 MED ORDER — IOPAMIDOL (ISOVUE-300) INJECTION 61%: 75.0000 mL | Freq: Once | INTRAVENOUS | Status: DC | PRN

## 2017-11-18 MED ORDER — POTASSIUM CHLORIDE 20 MEQ PO PACK
PACK | ORAL | Status: AC
  Filled 2017-11-18: qty 1

## 2017-11-18 MED ORDER — POTASSIUM CHLORIDE 20 MEQ PO PACK: 40.0000 meq | PACK | Freq: Once | ORAL | Status: DC

## 2017-11-18 MED ORDER — POTASSIUM CHLORIDE 20 MEQ PO PACK
40.0000 meq | PACK | Freq: Two times a day (BID) | ORAL | Status: DC
  Administered 2017-11-18: 40 meq via ORAL
  Filled 2017-11-18: qty 2

## 2017-11-18 MED ORDER — POTASSIUM CHLORIDE CRYS ER 20 MEQ PO TBCR
EXTENDED_RELEASE_TABLET | ORAL | Status: AC
  Filled 2017-11-18: qty 1

## 2017-11-18 MED ORDER — IOPAMIDOL (ISOVUE-370) INJECTION 76%
80.0000 mL | Freq: Once | INTRAVENOUS | Status: AC | PRN
  Administered 2017-11-18: 75 mL via INTRAVENOUS

## 2017-11-18 MED ORDER — POTASSIUM CHLORIDE CRYS ER 20 MEQ PO TBCR
40.0000 meq | EXTENDED_RELEASE_TABLET | Freq: Once | ORAL | Status: AC
  Administered 2017-11-18: 40 meq via ORAL

## 2017-11-18 NOTE — ED Provider Notes (Signed)
[facility] Emergency Department Provider Note ___   First MD Initiated Contact with Patient 11/18/17 [phone]     (approximate)  I have reviewed the triage vital signs and the nursing notes.   HISTORY  Chief Complaint Headache and Hypertension    HPI [name] is a 42 y.o. male with below list of chronic medical conditions presents to the emergency department with acute onset of right periorbital and posterior orbital headache which began last night accompanied by tearing of the right eye.  Patient stated maximum intensity pain was 10 out of 10 and described as sharp.  Patient denies any weakness no numbness no gait instability patient denies any previous headache history consistent with this episode.  Patient denies any nausea or vomiting.  Patient unsure of any family history of aneurysms cerebral tumors.   Past Medical History:  Diagnosis Date  . Hypertension   . Nasal congestion 10/03/2014  . Non-insulin dependent type 2 diabetes mellitus (HCC)   . Obesity   . Sleep apnea    uses CPAP nightly  . Wears contact lenses     Patient Active Problem List   Diagnosis Date Noted  . Hyperlipidemia associated with type 2 diabetes mellitus (HCC) 10/08/2017  . Hypogonadism in male 05/25/2017  . Diabetes mellitus due to underlying condition with complication, without long-term current use of insulin (HCC) 05/25/2017  . Vaccine counseling 05/25/2017  . Low testosterone 08/26/2016  . Screening for prostate cancer 08/26/2016  . RUQ abdominal pain 08/19/2016  . Lower abdominal pain 08/19/2016  . Chest wall pain 08/19/2016  . Decreased energy 08/19/2016  . Fatigue 08/19/2016  . Fatty liver 08/19/2016  . Need for prophylactic vaccination and inoculation against influenza 08/19/2016  . Hypokalemia 04/09/2016  . Encounter for health maintenance examination in adult 07/05/2015  . Diabetes mellitus type 2 in obese (HCC) 07/05/2015  . Essential hypertension  07/05/2015  . OSA on CPAP 07/05/2015  . S/P cholecystectomy 07/05/2015  . Adverse effects of medication 07/05/2015  . Noncompliance 07/05/2015  . Influenza vaccination declined 07/05/2015  . Morbid obesity (HCC) 07/05/2015    Past Surgical History:  Procedure Laterality Date  . CHOLECYSTECTOMY N/A 10/09/2014   Procedure: LAPAROSCOPIC CHOLECYSTECTOMY;  Surgeon: Abigail Miyamotoouglas Blackman, MD;  Location: [facility];  Service: General;  Laterality: N/A;    Prior to Admission medications   Medication Sig Start Date End Date Taking? Authorizing Provider  benazepril-hydrochlorthiazide (LOTENSIN HCT) 20-12.5 MG tablet Take 2 tablets by mouth daily. 11/09/17   [name], Kermit Baloavid S, PA-C  potassium chloride SA (K-DUR,KLOR-CON) 20 MEQ tablet Take 1 tablet (20 mEq total) by mouth 2 (two) times daily. 05/25/17   [name], Kermit Baloavid S, PA-C  pravastatin (PRAVACHOL) 20 MG tablet Take 1 tablet (20 mg total) by mouth daily. 05/25/17   [name], Kermit Baloavid S, PA-C  sitaGLIPtin-metformin (JANUMET) 50-1000 MG tablet Take 1 tablet by mouth 2 (two) times daily with a meal. 05/26/17   [name], Kermit Baloavid S, PA-C  tamsulosin (FLOMAX) 0.4 MG CAPS capsule Take 1 capsule (0.4 mg total) by mouth daily after supper. 05/25/17   [name], Kermit Baloavid S, PA-C    Allergies No known drug allergies  Family History  Problem Relation Age of Onset  . Hypertension Mother   . Kidney disease Father   . Diabetes Maternal Aunt   . Diabetes Paternal Aunt   . Cancer Maternal Grandfather        throat  . Stroke Paternal Grandmother   . Alcohol abuse Paternal  Grandmother   . Heart disease Neg Hx     Social History Social History   Tobacco Use  . Smoking status: Never Smoker  . Smokeless tobacco: Never Used  Substance Use Topics  . Alcohol use: Yes    Comment: occasional  . Drug use: No    Review of Systems Constitutional: No fever/chills Eyes: No visual changes. ENT: No sore throat. Cardiovascular: Denies chest  pain. Respiratory: Denies shortness of breath. Gastrointestinal: No abdominal pain.  No nausea, no vomiting.  No diarrhea.  No constipation. Genitourinary: Negative for dysuria. Musculoskeletal: Negative for neck pain.  Negative for back pain. Integumentary: Negative for rash. Neurological: Positive for headaches, negative for focal weakness or numbness.   ____________________________________________   PHYSICAL EXAM:  VITAL SIGNS: ED Triage Vitals  Enc Vitals Group     BP 11/17/17 2108 (!) 164/95     Pulse Rate 11/17/17 2108 (!) 102     Resp 11/17/17 2108 16     Temp 11/17/17 2108 97.6 F (36.4 C)     Temp Source 11/17/17 2108 Oral     SpO2 11/17/17 2108 98 %     Weight 11/17/17 2109 136.1 kg (300 lb)     Height 11/17/17 2109 1.753 m (5\' 9" )     Head Circumference --      Peak Flow --      Pain Score 11/17/17 2108 5     Pain Loc --      Pain Edu? --      Excl. in GC? --     Constitutional: Alert and oriented. Well appearing and in no acute distress. Eyes: Conjunctivae are normal.  Head: Atraumatic. Mouth/Throat: Mucous membranes are moist.  Oropharynx non-erythematous. Neck: No stridor.   Cardiovascular: Normal rate, regular rhythm. Good peripheral circulation. Grossly normal heart sounds. Respiratory: Normal respiratory effort.  No retractions. Lungs CTAB. Gastrointestinal: Soft and nontender. No distention.  Musculoskeletal: No lower extremity tenderness nor edema. No gross deformities of extremities. Neurologic:  Normal speech and language. No gross focal neurologic deficits are appreciated.  Skin:  Skin is warm, dry and intact. No rash noted. Psychiatric: Mood and affect are normal. Speech and behavior are normal.  ____________________________________________   LABS (all labs ordered are listed, but only abnormal results are displayed)  Labs Reviewed  COMPREHENSIVE METABOLIC PANEL - Abnormal; Notable for the following components:      Result Value    Potassium 2.6 (*)    Chloride 97 (*)    Total Bilirubin 2.4 (*)    All other components within normal limits  CBC    RADIOLOGY I, [place] Charelle Petrakis, personally viewed and evaluated these images (plain radiographs) as part of my medical decision making, as well as reviewing the written report by the radiologist.  Ct Angio Head W Or Wo Contrast  Result Date: 11/18/2017 CLINICAL DATA:  42 y/o  M; severe acute headache and hypertension. EXAM: CT ANGIOGRAPHY HEAD TECHNIQUE: Multidetector CT imaging of the head was performed using the standard protocol during bolus administration of intravenous contrast. Multiplanar CT image reconstructions and MIPs were obtained to evaluate the vascular anatomy. CONTRAST:  75mL ISOVUE-370 IOPAMIDOL (ISOVUE-370) INJECTION 76% COMPARISON:  None. FINDINGS: CT HEAD Brain: No evidence of acute infarction, hemorrhage, hydrocephalus, extra-axial collection or mass lesion/mass effect. Incidental low-lying cerebellar tonsils 4 mm below foramen magnum. No hydrocephalus, significant crowding of foramen magnum, mass effect on brainstem, or elongated appearance of cerebellar tonsils. Vascular: As below. Skull: Normal. Negative for fracture or focal  lesion. Sinuses: Mild ethmoid sinus mucosal thickening.  Otherwise negative. Orbits: No acute finding. CTA HEAD Anterior circulation: No significant stenosis, proximal occlusion, aneurysm, or vascular malformation. Posterior circulation: No significant stenosis, proximal occlusion, aneurysm, or vascular malformation. Venous sinuses: As permitted by contrast timing, patent. Anatomic variants: Fenestrated anterior communicating artery. Large bilateral posterior communicating arteries. Delayed phase: No abnormal intracranial enhancement. IMPRESSION: 1. No acute intracranial abnormality. 2. Incidental low-lying cerebellar tonsils. No secondary findings of Chiari 1 malformation. 3. Patent anterior and posterior intracranial circulation. No large  vessel occlusion, aneurysm, or significant stenosis is identified. Electronically Signed   By: Mitzi Hansen M.D.   On: 11/18/2017 02:41      Procedures   ____________________________________________   INITIAL IMPRESSION / ASSESSMENT AND PLAN / ED COURSE  As part of my medical decision making, I reviewed the following data within the electronic MEDICAL RECORD [id number] year old male presented with above-stated history and physical exam secondary to headache.  CT angiogram of the head was performed to evaluate for possible cerebral aneurysm given concomitant hypertension.  CT angios was negative.  Suspect likely possibly.  For the patient's headache to be cluster headaches giving symptoms of periorbital with tearing resolution following patient receiving oxygen. ____________________________________________  FINAL CLINICAL IMPRESSION(S) / ED DIAGNOSES  Final diagnoses:  Cluster headache, not intractable, unspecified chronicity pattern  Hypokalemia     MEDICATIONS GIVEN DURING THIS VISIT:  Medications  iopamidol (ISOVUE-300) 61 % injection 75 mL (not administered)  potassium chloride (KLOR-CON) packet 40 mEq (40 mEq Oral Given 11/18/17 0132)  iopamidol (ISOVUE-370) 76 % injection 80 mL (75 mLs Intravenous Contrast Given 11/18/17 0150)     ED Discharge Orders    None       Note:  This document was prepared using Dragon voice recognition software and may include unintentional dictation errors.    Darci Current, MD 11/18/17 [phone]

## 2017-11-18 NOTE — ED Notes (Signed)
Placed on non-rebreather per MD

## 2017-12-02 ENCOUNTER — Encounter: Payer: Self-pay | Admitting: Medical

## 2017-12-02 ENCOUNTER — Ambulatory Visit (INDEPENDENT_AMBULATORY_CARE_PROVIDER_SITE_OTHER): Payer: Managed Care, Other (non HMO) | Admitting: Medical

## 2017-12-02 VITALS — BP 136/84 | HR 72 | Wt 310.4 lb

## 2017-12-02 DIAGNOSIS — G44001 Cluster headache syndrome, unspecified, intractable: Secondary | ICD-10-CM | POA: Diagnosis not present

## 2017-12-02 DIAGNOSIS — G44019 Episodic cluster headache, not intractable: Secondary | ICD-10-CM

## 2017-12-02 DIAGNOSIS — E876 Hypokalemia: Secondary | ICD-10-CM

## 2017-12-02 DIAGNOSIS — I10 Essential (primary) hypertension: Secondary | ICD-10-CM | POA: Diagnosis not present

## 2017-12-02 DIAGNOSIS — E088 Diabetes mellitus due to underlying condition with unspecified complications: Secondary | ICD-10-CM

## 2017-12-02 MED ORDER — HYDROCODONE-ACETAMINOPHEN 5-325 MG PO TABS: 1.0000 | ORAL_TABLET | Freq: Four times a day (QID) | ORAL | 0 refills | Status: DC | PRN

## 2017-12-02 NOTE — Progress Notes (Signed)
Subjective: Chief Complaint  Patient presents with  . Hospitalization Follow-up    follow up for headaches,when to er on 15 th    Here for emergency department follow-up on cluster headaches.  He notes a history of headaches in the remote past but has not had headaches that are severe for years until recently. he went to the emergency department January 16 for severe headaches for several days, had lab work, CT angiogram of the head.  Was diagnosed with cluster headache which improved with oxygen therapy in the emergency department.  Since leaving the hospital he has had at least 4 significant headaches in the past 2 weeks, not relieved with over-the-counter analgesics.  When he gets the headaches they are moderate to severe, he does endorse photophobia, tearing and runny nose on one side, can be one-sided headaches.  He denies vision changes, hearing changes, numbness, tingling, weakness, fever, blurred vision, slurred speech.  He is compliant with his CPAP machine but his machine is probably 57 to 40 years old.  His potassium was low in the emergency department but he said there was no recommendations on this  He notes a family history is significant migraines with both mother and grandmother and sister  Past Medical History:  Diagnosis Date  . Hypertension   . Nasal congestion 10/03/2014  . Non-insulin dependent type 2 diabetes mellitus (HCC)   . Obesity   . Sleep apnea    uses CPAP nightly  . Wears contact lenses    Current Outpatient Medications on File Prior to Visit  Medication Sig Dispense Refill  . benazepril-hydrochlorthiazide (LOTENSIN HCT) 20-12.5 MG tablet Take 2 tablets by mouth daily. 180 tablet 1  . potassium chloride SA (K-DUR,KLOR-CON) 20 MEQ tablet Take 1 tablet (20 mEq total) by mouth 2 (two) times daily. 180 tablet 3  . pravastatin (PRAVACHOL) 20 MG tablet Take 1 tablet (20 mg total) by mouth daily. 90 tablet 3  . sitaGLIPtin-metformin (JANUMET) 50-1000 MG tablet Take  1 tablet by mouth 2 (two) times daily with a meal. 180 tablet 3  . tamsulosin (FLOMAX) 0.4 MG CAPS capsule Take 1 capsule (0.4 mg total) by mouth daily after supper. 90 capsule 3   No current facility-administered medications on file prior to visit.    ROS as in subjective   Objective: BP 136/84   Pulse 72   Wt (!) 310 lb 6.4 oz (140.8 kg)   SpO2 97%   BMI 45.84 kg/m     General appearance: alert, no distress, WD/WN,  HEENT: normocephalic, sclerae anicteric, PERRLA, EOMi, nares patent, no discharge or erythema, pharynx normal Oral cavity: MMM, no lesions Neck: supple, no lymphadenopathy, no thyromegaly, no masses Heart: RRR, normal S1, S2, no murmurs Lungs: CTA bilaterally, no wheezes, rhonchi, or rales Extremities: no edema, no cyanosis, no clubbing Pulses: 2+ symmetric, upper and lower extremities, normal cap refill Neurological: alert, oriented x 3, CN2-12 intact, strength normal upper extremities and lower extremities, sensation normal throughout, DTRs 2+ throughout, no cerebellar signs, gait normal Psychiatric: normal affect, behavior normal, pleasant     Assessment: Encounter Diagnoses  Name Primary?  . Essential hypertension Yes  . Hypokalemia   . Diabetes mellitus due to underlying condition with complication, without long-term current use of insulin (HCC)   . Intractable cluster headache syndrome, unspecified chronicity pattern   . Episodic cluster headache, not intractable     Plan: Hypertension-in light of his cluster headache diagnosis and hypokalemia, repeat labs today and consider medication changes.  Cluster  headache-referral to neurology.  Gave short-term hydrocodone for abortive therapy if needed  Diabetes recheck hemoglobin A1c today  Hypokalemia-labs today  Sleep apnea- is due for another study at this time, we can set this up, or neurology can pursue   [name] was seen today for hospitalization follow-up.  Diagnoses and all orders for this  visit:  Essential hypertension  Hypokalemia -     Basic metabolic panel  Diabetes mellitus due to underlying condition with complication, without long-term current use of insulin (HCC) -     Basic metabolic panel -     Hemoglobin A1c  Intractable cluster headache syndrome, unspecified chronicity pattern -     Ambulatory referral to Neurology  Episodic cluster headache, not intractable -     Ambulatory referral to Neurology  Other orders -     HYDROcodone-acetaminophen (NORCO) 5-325 MG tablet; Take 1 tablet by mouth every 6 (six) hours as needed for moderate pain.

## 2017-12-03 ENCOUNTER — Other Ambulatory Visit: Payer: Self-pay | Admitting: Medical

## 2017-12-03 DIAGNOSIS — E876 Hypokalemia: Secondary | ICD-10-CM

## 2017-12-03 LAB — BASIC METABOLIC PANEL
BUN/Creatinine Ratio: 15 (ref 9–20)
BUN: 17 mg/dL (ref 6–20)
CALCIUM: 9.3 mg/dL (ref 8.7–10.2)
CO2: 27 mmol/L (ref 20–29)
Chloride: 101 mmol/L (ref 96–106)
Creatinine, Ser: 1.14 mg/dL (ref 0.76–1.27)
GFR calc Af Amer: 93 mL/min/{1.73_m2} (ref >59)
GFR, EST NON AFRICAN AMERICAN: 81 mL/min/{1.73_m2} (ref >59)
Glucose: 94 mg/dL (ref 65–99)
POTASSIUM: 3.3 mmol/L — AB (ref 3.5–5.2)
Sodium: 144 mmol/L (ref 134–144)

## 2017-12-03 LAB — HEMOGLOBIN A1C
ESTIMATED AVERAGE GLUCOSE: 114 mg/dL
Hgb A1c MFr Bld: 5.6 % (ref 4.8–5.6)

## 2017-12-03 MED ORDER — SUMATRIPTAN SUCCINATE 50 MG PO TABS: 50.0000 mg | ORAL_TABLET | Freq: Once | ORAL | 2 refills | Status: DC | PRN

## 2017-12-03 MED ORDER — VERAPAMIL HCL 80 MG PO TABS: 80.0000 mg | ORAL_TABLET | Freq: Three times a day (TID) | ORAL | 2 refills | Status: DC

## 2017-12-03 MED ORDER — BENAZEPRIL HCL 20 MG PO TABS: 20.0000 mg | ORAL_TABLET | Freq: Every day | ORAL | 2 refills | Status: DC

## 2017-12-10 ENCOUNTER — Encounter: Payer: Self-pay | Admitting: Internal Medicine

## 2017-12-15 ENCOUNTER — Other Ambulatory Visit: Payer: Managed Care, Other (non HMO)

## 2017-12-15 DIAGNOSIS — E876 Hypokalemia: Secondary | ICD-10-CM

## 2017-12-16 LAB — BASIC METABOLIC PANEL
BUN/Creatinine Ratio: 15 (ref 9–20)
BUN: 15 mg/dL (ref 6–20)
CALCIUM: 9.1 mg/dL (ref 8.7–10.2)
CO2: 25 mmol/L (ref 20–29)
CREATININE: 1 mg/dL (ref 0.76–1.27)
Chloride: 105 mmol/L (ref 96–106)
GFR calc Af Amer: 109 mL/min/{1.73_m2} (ref >59)
GFR, EST NON AFRICAN AMERICAN: 94 mL/min/{1.73_m2} (ref >59)
Glucose: 88 mg/dL (ref 65–99)
POTASSIUM: 3.4 mmol/L — AB (ref 3.5–5.2)
Sodium: 147 mmol/L — ABNORMAL HIGH (ref 134–144)

## 2017-12-23 ENCOUNTER — Encounter: Payer: Self-pay | Admitting: Neurology

## 2017-12-23 ENCOUNTER — Ambulatory Visit (INDEPENDENT_AMBULATORY_CARE_PROVIDER_SITE_OTHER): Payer: Managed Care, Other (non HMO) | Admitting: Neurology

## 2017-12-23 VITALS — BP 132/86 | HR 86 | Ht 69.0 in | Wt 309.0 lb

## 2017-12-23 DIAGNOSIS — R51 Headache with orthostatic component, not elsewhere classified: Secondary | ICD-10-CM

## 2017-12-23 DIAGNOSIS — R519 Headache, unspecified: Secondary | ICD-10-CM

## 2017-12-23 DIAGNOSIS — G8929 Other chronic pain: Secondary | ICD-10-CM

## 2017-12-23 DIAGNOSIS — G44009 Cluster headache syndrome, unspecified, not intractable: Secondary | ICD-10-CM | POA: Diagnosis not present

## 2017-12-23 DIAGNOSIS — G935 Compression of brain: Secondary | ICD-10-CM | POA: Diagnosis not present

## 2017-12-23 DIAGNOSIS — R0683 Snoring: Secondary | ICD-10-CM

## 2017-12-23 DIAGNOSIS — G4733 Obstructive sleep apnea (adult) (pediatric): Secondary | ICD-10-CM

## 2017-12-23 MED ORDER — SUMATRIPTAN SUCCINATE 6 MG/0.5ML ~~LOC~~ SOLN: SUBCUTANEOUS | 1 refills | Status: DC

## 2017-12-23 NOTE — Patient Instructions (Addendum)
- Sleep evaluation (sleep team will call) - MRi of the brain  - At onset inject Sumatriptan. May repeat in 2 hours. Max 2 doses in 24 hours. - Continue Verapamil    Cluster Headache A cluster headache is a type of headache that causes deep, intense head pain. Cluster headaches can last from 15 minutes to 3 hours. They usually occur:  On one side of the head. They may occur on the other side when a new cluster of headaches begins.  Repeatedly over weeks to months.  Several times a day.  At the same time of day, often at night.  More often in the fall and springtime.  What are the causes? The cause of this condition is not known. What increases the risk? This condition is more likely to develop in:  Males.  People who drink alcohol.  People who smoke or use products that contain nicotine or tobacco.  People who take medicines that cause blood vessels to expand, such as nitroglycerin.  People who take antihistamines.  What are the signs or symptoms? Symptoms of this condition include:  Severe pain on one side of the head that begins behind or around your eye or temple.  Pain on one side of the head.  Nausea.  Sensitivity to light.  Runny nose and nasal stuffiness.  Sweaty, pale skin on the face.  Droopy or swollen eyelid, eye redness, or tearing.  Restlessness and agitation.  How is this diagnosed? This condition may be diagnosed based on:  Your symptoms.  A physical exam.  Your health care provider may order tests to see if your headaches are caused by another medical condition. These tests may show that you do not have cluster headaches. Tests may include:  A CT scan of your head.  An MRI of your head.  Lab tests.  How is this treated? This condition may be treated with:  Medicines to relieve pain and to prevent repeated (recurrent) attacks. Some people may need a combination of medicines.  Oxygen. This helps to relieve pain.  Follow these  instructions at home: Headache diary Keep a headache diary as told by your health care provider. Doing this can help you and your health care provider figure out what triggers your headaches. In your headache diary, include information about:  The time of day that your headache started and what you were doing when it began.  How long your headache lasted.  Where your pain started and whether it moved to other areas.  The type of pain, such as burning, stabbing, throbbing, or cramping.  Your level of pain. Use a pain scale and rate the pain with a number from 1 (mild) up to 10 (severe).  The treatment that you used, and any change in symptoms after treatment.  Medicines  Take over-the-counter and prescription medicines only as told by your health care provider.  Do not drive or use heavy machinery while taking prescription pain medicine.  Use oxygen as told by your health care provider. Lifestyle  Follow a regular sleep schedule. Do not vary the time that you go to bed or the amount that you sleep from day to day. It is important to stay on the same schedule during a cluster period to help prevent headaches.  Exercise regularly.  Eat a healthy diet and avoid foods that may trigger your headaches.  Avoid alcohol.  Do not use any products that contain nicotine or tobacco, such as cigarettes and e-cigarettes. If you need help  quitting, ask your health care provider. Contact a health care provider if:  Your headaches change, become more severe, or occur more often.  The medicine or oxygen that your health care provider recommended does not help. Get help right away if:  You faint.  You have weakness or numbness, especially on one side of your body or face.  You have double vision.  You have nausea or vomiting that does not go away within several hours.  You have trouble talking, walking, or keeping your balance.  You have pain or stiffness in your neck.  You have a  fever. Summary  A cluster headache is a type of headache that causes deep, intense head pain, usually on one side of the head.  Keep a headache diary to help discover what triggers your headaches.  A regular sleep schedule can help prevent headaches. This information is not intended to replace advice given to you by your health care provider. Make sure you discuss any questions you have with your health care provider. Document Released: 10/20/2005 Document Revised: 07/01/2016 Document Reviewed: 07/01/2016 [employer] Interactive Patient Education  [employer]2018 [employer]

## 2017-12-23 NOTE — Progress Notes (Signed)
[facility]    Provider:  Dr Lucia Gaskins Referring Provider: Jac Canavan, PA-C Primary Care Physician:  Jac Canavan, PA-C  CC:  Cluster Headaches  HPI:  [name] is a 42 y.o. male here as a referral from Dr. Aleen Campi for headaches.PMHx HTN, morbid obesity, OSA, diabetes.  It has been more than a decade since seeing a sleep doctor, he uses CPAP but has not had it checked, he is fatigued and snores through his cpap. Recommend repeat sleep test. Has headaches upon wakening.  He has headaches that start in the back of his neck for days. Can get severe. Behind the right eye, stabbing, eye watering and nose running just on that one side. Started in January. Imitrex. The headaches can last a few hours. 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 4 or 5 cluster headaches in the last 4-5 weeks. He has "regular" tension type headache and he can have that last a day before the cluster-type headaches, a few times a week. He has associated sensitivity to light, no vision changes or double vision, no weakness or numbness and tingling. The custer headaches can be severe with nausea. No inciting event or head trauma. Unknown triggers, worse in the evenings before going to work. Dark and quiet helps. No known Family history of migraines. No other focal neurologic deficits, associated symptoms, inciting events or modifiable factors.  Reviewed notes, labs and imaging from outside physicians, which showed:  Personally reviewed imaging and agree with the following:: 1. No acute intracranial abnormality. 2. Incidental low-lying cerebellar tonsils. No secondary findings of Chiari 1 malformation. 3. Patent anterior and posterior intracranial circulation. No large vessel occlusion, aneurysm, or significant stenosis is identified.  Hgba1c: 5.6 Bmp unremarkable  headaches; He reports headaches a few times a week. Pain is all over but "clusters" above his  right eye when it's at it's worst. He takes Imitrex (helps sometimes).   Review of Systems: Patient complains of symptoms per HPI as well as the following symptoms: snoring, headache, fatigue. Pertinent negatives and positives per HPI. All others negative.   Social History   Socioeconomic History  . Marital status: Married    Spouse name: Not on file  . Number of children: 1  . Years of education: Not on file  . Highest education level: Some college, no degree  Social Needs  . Financial resource strain: Not on file  . Food insecurity - worry: Not on file  . Food insecurity - inability: Not on file  . Transportation needs - medical: Not on file  . Transportation needs - non-medical: Not on file  Occupational History  . Not on file  Tobacco Use  . Smoking status: Never Smoker  . Smokeless tobacco: Never Used  Substance and Sexual Activity  . Alcohol use: Yes    Comment: occasional  . Drug use: No  . Sexual activity: Yes  Other Topics Concern  . Not on file  Social History Narrative   Lives at home with wife & 3 children and a dog.    Married, has 10yo boy.  Wife has 2 children, so 3 in the household.  Exercise some with walking.  Works as Teacher, early years/pre for [employer].     Drinks 1-2 cups of caffeine daily   Right handed           Family History  Problem Relation Age of Onset  . Hypertension Mother   . Kidney disease  Father   . Diabetes Maternal Aunt   . Diabetes Paternal Aunt   . Cancer Maternal Grandfather        throat  . Stroke Paternal Grandmother   . Alcohol abuse Paternal Grandmother   . Heart disease Neg Hx     Past Medical History:  Diagnosis Date  . Hypertension   . Migraine   . Nasal congestion 10/03/2014  . Non-insulin dependent type 2 diabetes mellitus (HCC)   . Obesity   . Sleep apnea    uses CPAP nightly  . Wears contact lenses     Past Surgical History:  Procedure Laterality Date  . CHOLECYSTECTOMY N/A 10/09/2014   Procedure:  LAPAROSCOPIC CHOLECYSTECTOMY;  Surgeon: [name], MD;  Location: [facility];  Service: General;  Laterality: N/A;    Current Outpatient Medications  Medication Sig Dispense Refill  . benazepril (LOTENSIN) 20 MG tablet Take 1 tablet (20 mg total) by mouth daily. 30 tablet 2  . HYDROcodone-acetaminophen (NORCO) 5-325 MG tablet Take 1 tablet by mouth every 6 (six) hours as needed for moderate pain. 15 tablet 0  . potassium chloride SA (K-DUR,KLOR-CON) 20 MEQ tablet Take 1 tablet (20 mEq total) by mouth 2 (two) times daily. 180 tablet 3  . pravastatin (PRAVACHOL) 20 MG tablet Take 1 tablet (20 mg total) by mouth daily. 90 tablet 3  . sitaGLIPtin-metformin (JANUMET) 50-1000 MG tablet Take 1 tablet by mouth 2 (two) times daily with a meal. 180 tablet 3  . tamsulosin (FLOMAX) 0.4 MG CAPS capsule Take 1 capsule (0.4 mg total) by mouth daily after supper. 90 capsule 3  . verapamil (CALAN) 80 MG tablet Take 1 tablet (80 mg total) by mouth 3 (three) times daily. 90 tablet 2  . SUMAtriptan (IMITREX) 6 MG/0.5ML SOLN injection Take one dose at headache onset, can take additional dose 2hrs later if needed. No more then 2 injections in 24hrs 8 vial 1   No current facility-administered medications for this visit.     Allergies as of 12/23/2017  . (No Known Allergies)    Vitals: BP 132/86 (BP Location: Right Arm, Patient Position: Sitting)   Pulse 86   Ht 5\' 9"  (1.753 m)   Wt (!) 309 lb (140.2 kg)   BMI 45.63 kg/m  Last Weight:  Wt Readings from Last 1 Encounters:  12/23/17 (!) 309 lb (140.2 kg)   Last Height:   Ht Readings from Last 1 Encounters:  12/23/17 5\' 9"  (1.753 m)   Physical exam: Exam: Gen: NAD, conversant, well nourised, obese, well groomed                     CV: RRR, no MRG. No Carotid Bruits. No peripheral edema, warm, nontender Eyes: Conjunctivae clear without exudates or hemorrhage  Neuro: Detailed Neurologic Exam  Speech:    Speech is normal; fluent  and spontaneous with normal comprehension.  Cognition:    The patient is oriented to person, place, and time;     recent and remote memory intact;     language fluent;     normal attention, concentration,     fund of knowledge Cranial Nerves:    The pupils are equal, round, and reactive to light. The fundi are normal and spontaneous venous pulsations are present. Visual fields are full to finger confrontation. Extraocular movements are intact. Trigeminal sensation is intact and the muscles of mastication are normal. The face is symmetric. The palate elevates in the midline. Hearing intact. Voice  is normal. Shoulder shrug is normal. The tongue has normal motion without fasciculations.   Coordination:    Normal finger to nose and heel to shin. Normal rapid alternating movements.   Gait:    Heel-toe and tandem gait are normal.   Motor Observation:    No asymmetry, no atrophy, and no involuntary movements noted. Tone:    Normal muscle tone.    Posture:    Posture is normal. normal erect    Strength:    Strength is V/V in the upper and lower limbs.      Sensation: intact to LT     Reflex Exam:  DTR's:    Deep tendon reflexes in the upper and lower extremities are normal bilaterally.   Toes:    The toes are downgoing bilaterally.   Clonus:    Clonus is absent.       Assessment/Plan:  Patient with new onset headaches behind the right eye, stabbing, eye watering and nose running just on that one side. Consistent with cluster headaches. Also has migrainous symptoms.   -  Continue Verapamil: 240mg  total daily, this is first line for Cluster Headaches - Imitrex injectable acutely: Injectable quicker than pill form for cluster headaches - MRI brain w/wo contrast: Possible chiari malformation seen on CT head, also positional occipital headaches need to rule out severe chiari and other lesions - It has been more than a decade since seeing a sleep doctor, he uses CPAP but has not had  it checked, he is fatigued and snores through his cpap. Recommend repeat sleep test. Has headaches upon wakening.  - steroids for one week: Will hold off due to diabetes  Orders Placed This Encounter  Procedures  . MR BRAIN W WO CONTRAST  . Ambulatory referral to Sleep Studies   Discussed: To prevent or relieve headaches, try the following: Cool Compress. Lie down and place a cool compress on your head.  Avoid headache triggers. If certain foods or odors seem to have triggered your migraines in the past, avoid them. A headache diary might help you identify triggers.  Include physical activity in your daily routine. Try a daily walk or other moderate aerobic exercise.  Manage stress. Find healthy ways to cope with the stressors, such as delegating tasks on your to-do list.  Practice relaxation techniques. Try deep breathing, yoga, massage and visualization.  Eat regularly. Eating regularly scheduled meals and maintaining a healthy diet might help prevent headaches. Also, drink plenty of fluids.  Follow a regular sleep schedule. Sleep deprivation might contribute to headaches Consider biofeedback. With this mind-body technique, you learn to control certain bodily functions - such as muscle tension, heart rate and blood pressure - to prevent headaches or reduce headache pain.    Proceed to emergency room if you experience new or worsening symptoms or symptoms do not resolve, if you have new neurologic symptoms or if headache is severe, or for any concerning symptom.   Provided education and documentation from American headache Society toolbox including articles on: chronic migraine medication overuse headache, chronic migraines, prevention of migraines, behavioral and other nonpharmacologic treatments for headache.  [email or web address]:[name]  Naomie DeanAntonia Ahern, MD  [facility] [address] [place], [place] [id number]  Phone [phone] Fax [phone]

## 2018-01-12 ENCOUNTER — Other Ambulatory Visit: Payer: Managed Care, Other (non HMO)

## 2018-01-13 ENCOUNTER — Ambulatory Visit
Admission: RE | Admit: 2018-01-13 | Discharge: 2018-01-13 | Disposition: A | Discharge status: Home / Self Care | Payer: Managed Care, Other (non HMO) | Source: Ambulatory Visit | Attending: Neurology | Admitting: Neurology

## 2018-01-13 DIAGNOSIS — G8929 Other chronic pain: Secondary | ICD-10-CM

## 2018-01-13 DIAGNOSIS — G44009 Cluster headache syndrome, unspecified, not intractable: Secondary | ICD-10-CM

## 2018-01-13 DIAGNOSIS — R519 Headache, unspecified: Secondary | ICD-10-CM

## 2018-01-13 DIAGNOSIS — G935 Compression of brain: Secondary | ICD-10-CM

## 2018-01-13 DIAGNOSIS — R51 Headache with orthostatic component, not elsewhere classified: Secondary | ICD-10-CM

## 2018-01-13 MED ORDER — GADOBENATE DIMEGLUMINE 529 MG/ML IV SOLN
20.0000 mL | Freq: Once | INTRAVENOUS | Status: AC | PRN
  Administered 2018-01-13: 20 mL via INTRAVENOUS

## 2018-01-17 ENCOUNTER — Other Ambulatory Visit: Payer: Managed Care, Other (non HMO)

## 2018-01-26 ENCOUNTER — Telehealth: Payer: Self-pay | Admitting: Neurology

## 2018-01-26 NOTE — Telephone Encounter (Signed)
[name] CopierBethany, patient comes in tomorrow. I want to touch base with him on MRI brain which was unremarkable but did have some findings I want to review. Maybe from 1015-1030?  I can also see if Dr. Frances FurbishAthar will address the findings quickly in her appointment. thanks

## 2018-01-27 ENCOUNTER — Telehealth: Payer: Self-pay | Admitting: Neurology

## 2018-01-27 ENCOUNTER — Ambulatory Visit (INDEPENDENT_AMBULATORY_CARE_PROVIDER_SITE_OTHER): Payer: Managed Care, Other (non HMO) | Admitting: Neurology

## 2018-01-27 ENCOUNTER — Encounter: Payer: Self-pay | Admitting: Neurology

## 2018-01-27 ENCOUNTER — Other Ambulatory Visit: Payer: Self-pay | Admitting: Neurology

## 2018-01-27 VITALS — BP 168/97 | HR 96 | Ht 69.0 in | Wt 309.0 lb

## 2018-01-27 DIAGNOSIS — R51 Headache: Secondary | ICD-10-CM | POA: Diagnosis not present

## 2018-01-27 DIAGNOSIS — R0683 Snoring: Secondary | ICD-10-CM

## 2018-01-27 DIAGNOSIS — Z820 Family history of epilepsy and other diseases of the nervous system: Secondary | ICD-10-CM | POA: Diagnosis not present

## 2018-01-27 DIAGNOSIS — G4726 Circadian rhythm sleep disorder, shift work type: Secondary | ICD-10-CM | POA: Diagnosis not present

## 2018-01-27 DIAGNOSIS — G935 Compression of brain: Secondary | ICD-10-CM

## 2018-01-27 DIAGNOSIS — Z789 Other specified health status: Secondary | ICD-10-CM | POA: Diagnosis not present

## 2018-01-27 DIAGNOSIS — G44009 Cluster headache syndrome, unspecified, not intractable: Secondary | ICD-10-CM

## 2018-01-27 DIAGNOSIS — R519 Headache, unspecified: Secondary | ICD-10-CM

## 2018-01-27 DIAGNOSIS — J320 Chronic maxillary sinusitis: Secondary | ICD-10-CM

## 2018-01-27 DIAGNOSIS — G4733 Obstructive sleep apnea (adult) (pediatric): Secondary | ICD-10-CM | POA: Diagnosis not present

## 2018-01-27 NOTE — Progress Notes (Signed)
Subjective:    Patient ID: [name] is a 42 y.o. male.  HPI     [name] Foley, MD, PhD [facility] [address], [address] [address] [place], [place] [address]  Dear [name],   I saw your patient, [name], upon your kind request in my clinic today for initial consultation of his sleep disorder, in particular, reevaluation of his prior diagnosis of obstructive sleep apnea. The patient is unaccompanied today. As you know, [name] is a 42 year old right-handed gentleman with an underlying medical history of recurrent headaches, hypertension, diabetes, and morbid obesity with a BMI of over 40, who was previously diagnosed with obstructive sleep apnea and placed on CPAP therapy. He has not had reevaluation in some years. He has some residual snoring. I reviewed your office note from 12/23/2017. You ordered a brain MRI. He had a brain MRI with and without contrast on 01/13/2018: IMPRESSION:  Unremarkable MRI scan of the brain with and without contrast. Incidental findings of mild chronic paranasal sinusitis and low lying cerebellar tonsils and partially empty sella.  He does not always use CPAP machine. Unfortunately, a compliance download was not possible from his old machine. Prior sleep study results are not available for my review today. His Epworth sleepiness score is 18 out of 24, fatigue score is 51 out of 63. He has been using a fullface mask. Unfortunately, his humidifier broke some years ago and he has not been able to get a new one which makes it difficult for him to use his CPAP at night. Nevertheless, he reports that he feels better when he uses CPAP as far as his daytime symptoms are concerned. 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 The time varies but he tries to be in bed by 8:30 or latest 9. He does have to get his kids ready for school on most mornings. Rise time ideally is between 3 and 4 PM.  He does not have to get up to use the bathroom in the middle of his sleep cycle. He has had worsening headaches. He has taken hydrocodone as needed for headache control through PCP but does not currently have a prescription for it. His sister has recently been diagnosed with sleep apnea. Overall, he has been able to lose some weight compared to when he first got diagnosed several years ago with sleep apnea. This was over 5 in most likely less than 10 years ago and he was told he had severe sleep apnea. He estimates that he lost about 30 pounds. He lives at home with his wife and 3 children, ages 63, 7 and 22. He works in Teacher, early years/pre for [employer].  His Past Medical History Is Significant For: Past Medical History:  Diagnosis Date  . Hypertension   . Migraine   . Nasal congestion 10/03/2014  . Non-insulin dependent type 2 diabetes mellitus (HCC)   . Obesity   . Sleep apnea    uses CPAP nightly  . Wears contact lenses     His Past Surgical History Is Significant For: Past Surgical History:  Procedure Laterality Date  . CHOLECYSTECTOMY N/A 10/09/2014   Procedure: LAPAROSCOPIC CHOLECYSTECTOMY;  Surgeon: [name] Miyamoto, MD;  Location: [facility];  Service: General;  Laterality: N/A;    His Family History Is Significant For: Family History  Problem Relation Age of Onset  . Hypertension Mother   . Kidney disease Father   .  Diabetes Maternal Aunt   . Diabetes Paternal Aunt   . Cancer Maternal Grandfather        throat  . Stroke Paternal Grandmother   . Alcohol abuse Paternal Grandmother   . Heart disease Neg Hx     His Social History Is Significant For: Social History   Socioeconomic History  . Marital status: Married    Spouse name: Not on file  . Number of children: 1  . Years of education: Not on file  . Highest education level: Some college, no degree  Occupational History  . Not on file  Social Needs  . Financial resource strain: Not on file  . Food  insecurity:    Worry: Not on file    Inability: Not on file  . Transportation needs:    Medical: Not on file    Non-medical: Not on file  Tobacco Use  . Smoking status: Never Smoker  . Smokeless tobacco: Never Used  Substance and Sexual Activity  . Alcohol use: Yes    Comment: occasional  . Drug use: No  . Sexual activity: Yes  Lifestyle  . Physical activity:    Days per week: Not on file    Minutes per session: Not on file  . Stress: Not on file  Relationships  . Social connections:    Talks on phone: Not on file    Gets together: Not on file    Attends religious service: Not on file    Active member of club or organization: Not on file    Attends meetings of clubs or organizations: Not on file    Relationship status: Not on file  Other Topics Concern  . Not on file  Social History Narrative   Lives at home with wife & 3 children and a dog.    Married, has 10yo boy.  Wife has 2 children, so 3 in the household.  Exercise some with walking.  Works as Teacher, early years/pre for [employer].     Drinks 1-2 cups of caffeine daily   Right handed           His Allergies Are:  No Known Allergies:   His Current Medications Are:  Outpatient Encounter Medications as of 01/27/2018  Medication Sig  . benazepril (LOTENSIN) 20 MG tablet Take 1 tablet (20 mg total) by mouth daily.  [name] HYDROcodone-acetaminophen (NORCO) 5-325 MG tablet Take 1 tablet by mouth every 6 (six) hours as needed for moderate pain.  . potassium chloride SA (K-DUR,KLOR-CON) 20 MEQ tablet Take 1 tablet (20 mEq total) by mouth 2 (two) times daily.  . pravastatin (PRAVACHOL) 20 MG tablet Take 1 tablet (20 mg total) by mouth daily.  . sitaGLIPtin-metformin (JANUMET) 50-1000 MG tablet Take 1 tablet by mouth 2 (two) times daily with a meal.  . SUMAtriptan (IMITREX) 6 MG/0.5ML SOLN injection Take one dose at headache onset, can take additional dose 2hrs later if needed. No more then 2 injections in 24hrs  . tamsulosin  (FLOMAX) 0.4 MG CAPS capsule Take 1 capsule (0.4 mg total) by mouth daily after supper.  . verapamil (CALAN) 80 MG tablet Take 1 tablet (80 mg total) by mouth 3 (three) times daily.   No facility-administered encounter medications on file as of 01/27/2018.   :  Review of Systems:  Out of a complete 14 point review of systems, all are reviewed and negative with the exception of these symptoms as listed below:  Review of Systems  Neurological:  Pt presents today to discuss his sleep. Pt has a current cpap that has not been serviced in a long time since pt's DME has gone out of business. Pt uses his cpap "most of the time." Unable to get any data from his machine, the card reads "corrupt." Pt's cpap machine is over 40 years old.  Epworth Sleepiness Scale 0= would never doze 1= slight chance of dozing 2= moderate chance of dozing 3= high chance of dozing  Sitting and reading: 3 Watching TV: 2 Sitting inactive in a public place (ex. Theater or meeting): 2 As a passenger in a car for an hour without a break: 3 Lying down to rest in the afternoon: 3 Sitting and talking to someone: 2 Sitting quietly after lunch (no alcohol): 2 In a car, while stopped in traffic: 1 Total: 18     Objective:  Neurological Exam  Physical Exam Physical Examination:   Vitals:   01/27/18 0903  BP: (!) 168/97  Pulse: 96   General Examination: The patient is a very pleasant 42 y.o. male in no acute distress. He appears well-developed and well-nourished and well groomed.   HEENT: Normocephalic, atraumatic, pupils are equal, round and reactive to light and accommodation. Funduscopic exam is normal with sharp disc margins noted. Extraocular tracking is good without limitation to gaze excursion or nystagmus noted. Normal smooth pursuit is noted. Hearing is grossly intact. Tympanic membranes are clear bilaterally. Face is symmetric with normal facial animation and normal facial sensation. Speech is clear  with no dysarthria noted. There is no hypophonia. There is no lip, neck/head, jaw or voice tremor. Neck is supple with full range of passive and active motion. There are no carotid bruits on auscultation. Oropharynx exam reveals: mild mouth dryness, adequate dental hygiene and marked airway crowding, due to smaller airway entry, thicker soft palate, large uvula, larger tongue and tonsils in place. Tonsils are about 1+ in size. Neck circumference is 20-3/4 inches. Mallampati is class III. Tongue protrudes centrally and palate elevates symmetrically.    Chest: Clear to auscultation without wheezing, rhonchi or crackles noted.  Heart: S1+S2+0, regular and normal without murmurs, rubs or gallops noted.   Abdomen: Soft, non-tender and non-distended with normal bowel sounds appreciated on auscultation.  Extremities: There is no pitting edema in the distal lower extremities bilaterally. Pedal pulses are intact.  Skin: Warm and dry without trophic changes noted.  Musculoskeletal: exam reveals no obvious joint deformities, tenderness or joint swelling or erythema.   Neurologically:  Mental status: The patient is awake, alert and oriented in all 4 spheres. His immediate and remote memory, attention, language skills and fund of knowledge are appropriate. There is no evidence of aphasia, agnosia, apraxia or anomia. Speech is clear with normal prosody and enunciation. Thought process is linear. Mood is normal and affect is normal.  Cranial nerves II - XII are as described above under HEENT exam. In addition: shoulder shrug is normal with equal shoulder height noted. Motor exam: Normal bulk, strength and tone is noted. There is no drift, tremor or rebound. Fine motor skills and coordination are grossly intact.  Cerebellar testing: No dysmetria or intention tremor on finger to nose testing. Heel to shin is unremarkable bilaterally. There is no truncal or gait ataxia.  Sensory exam: intact to light touch.    Gait, station and balance: He stands easily. No veering to one side is noted. No leaning to one side is noted. Posture is age-appropriate and stance is narrow based. Gait shows  normal stride length and normal pace. No problems turning are noted.   Assessment and Plan:   In summary, [name] is a very pleasant 42 y.o.-year old male  with an underlying medical history of recurrent headaches, hypertension, diabetes, and morbid obesity with a BMI of over 40, who was previously diagnosed with obstructive sleep apnea. He is not fully compliant with CPAP therapy by self-report because of a broken humidifier. This makes it difficult for him to tolerate the pressure due to mouth dryness. He has been using a fullface mask and reports that he could not tolerate a nasal mask due to inability to breathe through his nose and perpetuating nasal congestion with a nasal interface. He has been working on weight loss and is advised to continue to work on weight reduction. He would benefit from reevaluation of his OSA and updating his machine and supplies. I explained the risks and ramifications of untreated moderate to severe OSA, especially with respect to developing cardiovascular disease down the Road, including congestive heart failure, difficult to treat hypertension, cardiac arrhythmias, or stroke. Even type 2 diabetes has, in part, been linked to untreated OSA. Symptoms of untreated OSA include daytime sleepiness, memory problems, mood irritability and mood disorder such as depression and anxiety, lack of energy, as well as recurrent headaches, especially morning headaches. We talked about trying to maintain a healthy lifestyle in general, as well as the importance of weight control. I encouraged the patient to eat healthy, exercise daily and keep well hydrated, to keep a scheduled bedtime and wake time routine, to not skip any meals and eat healthy snacks in between meals. I advised the patient not to drive when  feeling sleepy. I recommended the following at this time: sleep study with potential positive airway pressure titration. (We will score hypopneas at 4%).   I explained the sleep test procedure to the patient and also outlined possible surgical and non-surgical treatment options of OSA, including the use of a custom-made dental device (which would require a referral to a specialist dentist or oral surgeon), upper airway surgical options, such as pillar implants, radiofrequency surgery, tongue base surgery, and UPPP (which would involve a referral to an ENT surgeon). Rarely, jaw surgery such as mandibular advancement may be considered.  I also explained the CPAP treatment option to the patient, who indicated that he would be willing to continue to use CPAP if the need arises. I explained the importance of being compliant with PAP treatment, not only for insurance purposes but primarily to improve His symptoms, and for the patient's long term health benefit, including to reduce His cardiovascular risks. I answered all his questions today and the patient was in agreement. I would like to see him back after the sleep study is completed and encouraged him to call with any interim questions, concerns, problems or updates.   Thank you very much for allowing me to participate in the care of this nice patient. If I can be of any further assistance to you please do not hesitate to talk to me.   Sincerely,   [name] Foley, MD, PhD

## 2018-01-27 NOTE — Patient Instructions (Addendum)

## 2018-01-27 NOTE — Telephone Encounter (Signed)
Spoke with pt this AM while pt here for appt with Dr. Frances FurbishAthar. He is aware that Dr. Lucia GaskinsAhern will come and speak with him after his appt.

## 2018-01-27 NOTE — Telephone Encounter (Signed)
Pt is requesting a call with MRI results and wondering if he is needing a f/u appt. Please call. Thank you.

## 2018-01-27 NOTE — Telephone Encounter (Signed)
Patient is currently in office for a visit with Dr. Frances FurbishAthar. Dr. Lucia GaskinsAhern to speak with patient while he is here.

## 2018-02-05 ENCOUNTER — Telehealth: Payer: Self-pay

## 2018-02-05 DIAGNOSIS — R0683 Snoring: Secondary | ICD-10-CM

## 2018-02-05 NOTE — Telephone Encounter (Signed)
[employer] denied in lab sleep study, need HST order 

## 2018-02-05 NOTE — Telephone Encounter (Signed)
HST order placed. 

## 2018-02-08 ENCOUNTER — Telehealth: Payer: Self-pay | Admitting: Medical

## 2018-02-08 NOTE — Telephone Encounter (Signed)
Patient has [facility] a [facility] the [facility]:  verapamil 80mg , 90 day supply with 4 refills  Benazepril 20mg  90 day supply 4 refills  Sumatriptan 50mg  90 day supply 4 refills

## 2018-02-12 ENCOUNTER — Other Ambulatory Visit: Payer: Self-pay

## 2018-02-12 MED ORDER — BENAZEPRIL HCL 20 MG PO TABS: 20.0000 mg | ORAL_TABLET | Freq: Every day | ORAL | 0 refills | Status: DC

## 2018-02-12 MED ORDER — SUMATRIPTAN SUCCINATE 6 MG/0.5ML ~~LOC~~ SOLN: SUBCUTANEOUS | 1 refills | Status: DC

## 2018-02-12 MED ORDER — VERAPAMIL HCL 80 MG PO TABS: 80.0000 mg | ORAL_TABLET | Freq: Three times a day (TID) | ORAL | 0 refills | Status: DC

## 2018-03-03 ENCOUNTER — Ambulatory Visit (INDEPENDENT_AMBULATORY_CARE_PROVIDER_SITE_OTHER): Payer: Managed Care, Other (non HMO) | Admitting: Neurology

## 2018-03-03 DIAGNOSIS — G4733 Obstructive sleep apnea (adult) (pediatric): Secondary | ICD-10-CM

## 2018-03-03 DIAGNOSIS — R0683 Snoring: Secondary | ICD-10-CM

## 2018-03-10 ENCOUNTER — Telehealth: Payer: Self-pay

## 2018-03-10 NOTE — Progress Notes (Signed)
Patient referred by Dr. Lucia Gaskins for re-eval of his OSA, seen by me on 01/27/18, HST on 03/04/18.    Please call and notify the patient that the recent home sleep test confirmed severe obstructive sleep apnea. While I usually recommend a CPAP titration sleep study in our lab for proper treatment settings, his insurance will not approve an attended sleep study, therefore, I will order an autoPAP machine, which means, that we don't have to bring him in for a sleep study with CPAP, but will let him try an autoPAP machine at home, through a DME company (of his choice, or as per insurance requirement). The DME representative will educate him on how to use the machine, how to put the mask on, etc. I have placed an order in the chart. He may have an existing DME co. Please send referral, talk to patient, send report to referring MD. We will need a FU in sleep clinic for 10 weeks post-PAP set up, please arrange that with me or one of our NPs. Thanks,   [name], MD, PhD [facility])

## 2018-03-10 NOTE — Addendum Note (Signed)
Addended by: [name] on: 03/10/2018 08:40 AM   Modules accepted: Orders

## 2018-03-10 NOTE — Telephone Encounter (Signed)
-----   Message from [name], MD sent at 03/10/2018  8:40 AM EDT ----- Patient referred by Dr. Lucia Gaskins for re-eval of his OSA, seen by me on 01/27/18, HST on 03/04/18.    Please call and notify the patient that the recent home sleep test confirmed severe obstructive sleep apnea. While I usually recommend a CPAP titration sleep study in our lab for proper treatment settings, his insurance will not approve an attended sleep study, therefore, I will order an autoPAP machine, which means, that we don't have to bring him in for a sleep study with CPAP, but will let him try an autoPAP machine at home, through a DME company (of his choice, or as per insurance requirement). The DME representative will educate him on how to use the machine, how to put the mask on, etc. I have placed an order in the chart. He may have an existing DME co. Please send referral, talk to patient, send report to referring MD. We will need a FU in sleep clinic for 10 weeks post-PAP set up, please arrange that with me or one of our NPs. Thanks,   [name], MD, PhD [facility])

## 2018-03-10 NOTE — Procedures (Signed)
[facility] [address] [place], [place] [address] NAME:   [name]                                                              DOB:[birth year] MEDICAL RECORD NUMBER [id number]                                             DOS: 03/04/18 REFERRING PHYSICIAN: Naomie Dean, MD STUDY PERFORMED: Home Sleep Study HISTORY: 42 year old man with a history of recurrent headaches, hypertension, diabetes, and morbid obesity with a BMI of over 40, who was previously diagnosed with obstructive sleep apnea and placed on CPAP therapy. He presents for re-evaluation. Epworth sleepiness score: 18/24, BMI:45.63  STUDY RESULTS:  Total Recording Time: 6 hours   5 minutes Total Apnea/Hypopnea Index (AHI):   62.4/h, RDI: 62.8/h Average Oxygen Saturation:   93% , Lowest Oxygen Desaturation: 74%  Total Time Oxygen Saturation Below or at 88 %: 44.0 minutes (13.6%)  Average Heart Rate: 86 bpm (between 45 and 119  bpm) IMPRESSION: OSA RECOMMENDATION: This home sleep test demonstrates severe obstructive sleep apnea with a total AHI of 62.4/hour and O2 nadir of 74%. Given the patient's medical history and sleep related complaints, treatment with positive airway pressure (in the form of CPAP) is recommended. This will require a full night CPAP titration study for proper treatment settings, O2 monitoring and mask fitting. Based on the severity of the sleep disordered breathing an attended titration study is indicated. However, his insurance has denied an attended sleep study. Therefore, I will recommend starting the patient on home autoPAP therapy. Please note that untreated obstructive sleep apnea may carry additional perioperative morbidity. Patients with significant obstructive sleep apnea should receive perioperative PAP therapy and the surgeons and particularly the anesthesiologist should be informed of the diagnosis and the severity of the sleep disordered breathing. The patient should be  cautioned not to drive, work at heights, or operate dangerous or heavy equipment when tired or sleepy. Review and reiteration of good sleep hygiene measures should be pursued with any patient. Other causes of the patient's symptoms, including circadian rhythm disturbances, an underlying mood disorder, medication effect and/or an underlying medical problem cannot be ruled out based on this test. Clinical correlation is recommended.  I certify that I have reviewed the raw data recording prior to the issuance of this report in accordance with the standards of the American Academy of Sleep Medicine (AASM).  [name], MD, PhD  Diplomat, ABPN (Neurology and Sleep)

## 2018-03-10 NOTE — Telephone Encounter (Signed)
I called pt. I advised pt that Dr. Frances Furbish reviewed their sleep study results and found that pt has severe osa. Dr. Frances Furbish recommends that pt start an auto pap, since pt's insurance will deny an attended sleep study. I reviewed PAP compliance expectations with the pt. Pt is agreeable to starting an auto-PAP. I advised pt that an order will be sent to a DME, [facility], and [facility] will call the pt within about one week after they file with the pt's insurance. [facility] will show the pt how to use the machine, fit for masks, and troubleshoot the auto-PAP if needed. A follow up appt was made for insurance purposes with Dr. Frances Furbish on 06/15/18 at 9:30am. Pt verbalized understanding to arrive 15 minutes early and bring their auto-PAP. A letter with all of this information in it will be sent to the pt's mychart account as a reminder. I verified with the pt that the address we have on file is correct. Pt verbalized understanding of results. Pt had no questions at this time but was encouraged to call back if questions arise.

## 2018-03-29 IMAGING — CT CT ANGIO HEAD
4 of 11 series · 17 of 47 positions shown · IV contrast (APPLIED)
Comparison: None.

CLINICAL DATA: 41 y/o  M; severe acute headache and hypertension.

EXAM:
CT ANGIOGRAPHY HEAD
TECHNIQUE: Multidetector CT imaging of the head was performed using the
standard protocol during bolus administration of intravenous
contrast. Multiplanar CT image reconstructions and MIPs were
obtained to evaluate the vascular anatomy.
CONTRAST:  75mL 5L1LES-97D IOPAMIDOL (5L1LES-97D) INJECTION 76%

[Series 6: cta head · axial · 0.42mm/px · z∈[-104,-6]mm · 5 of 75 slices shown]
[im 13/75  brain]
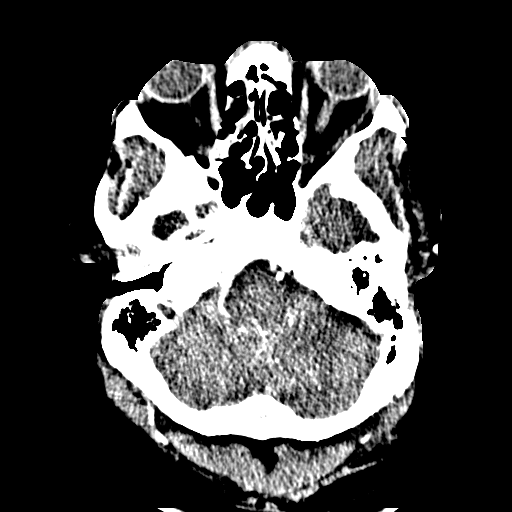
[im 25/75  bone]
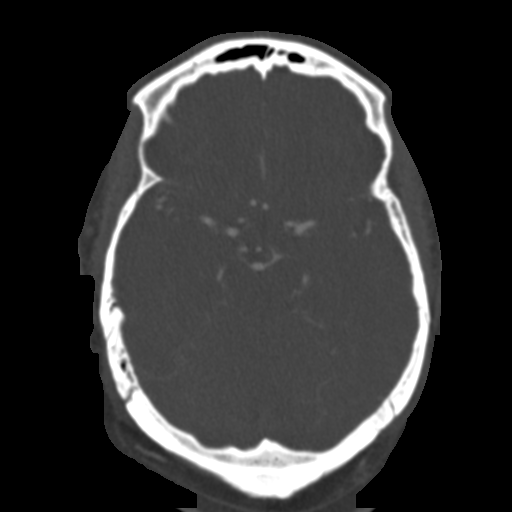
[im 38/75  brain]
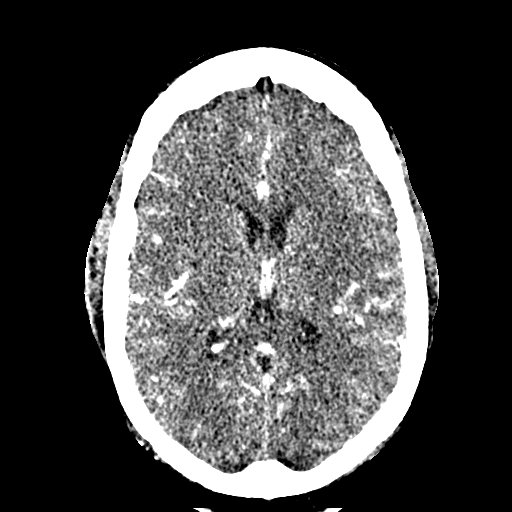
[im 50/75  bone]
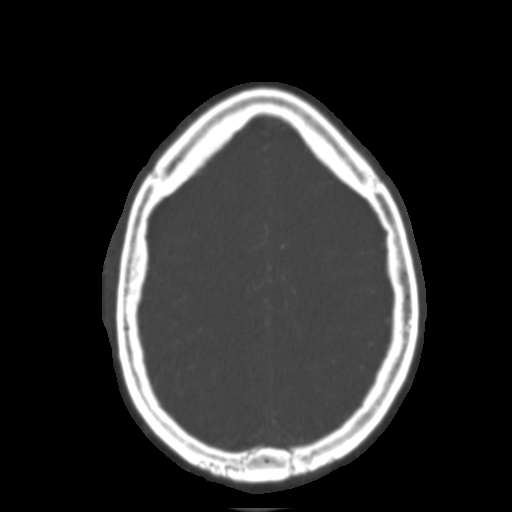
[im 62/75  brain]
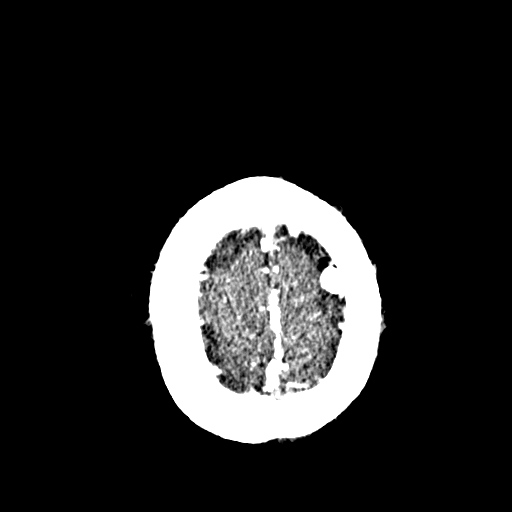

[Series 8: ax thin · axial · 0.36mm/px · z∈[-92,-6]mm · 6 of 147 slices shown]
[im 12/147  brain]
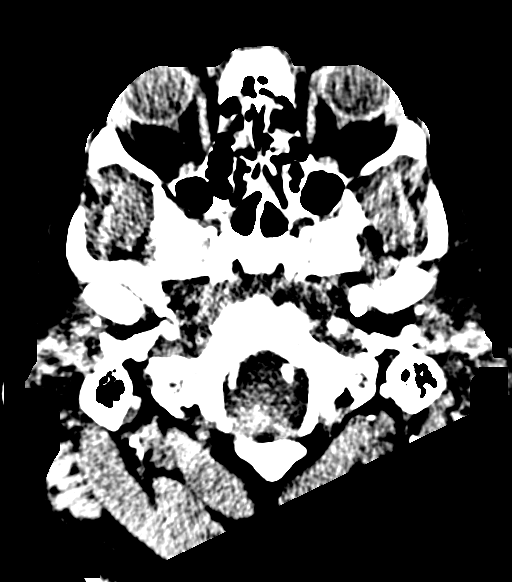
[im 34/147  brain]
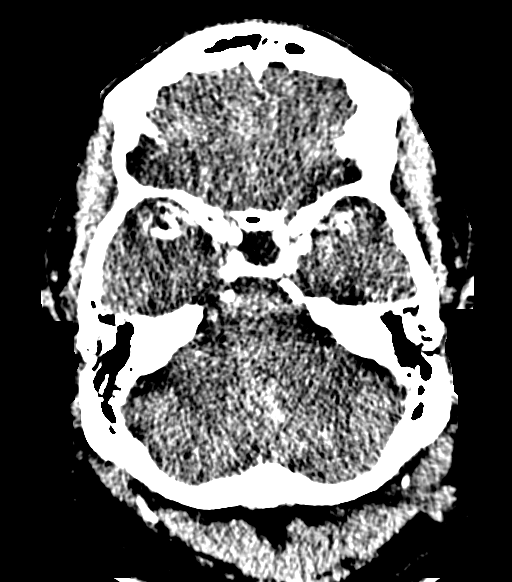
[im 45/147  brain]
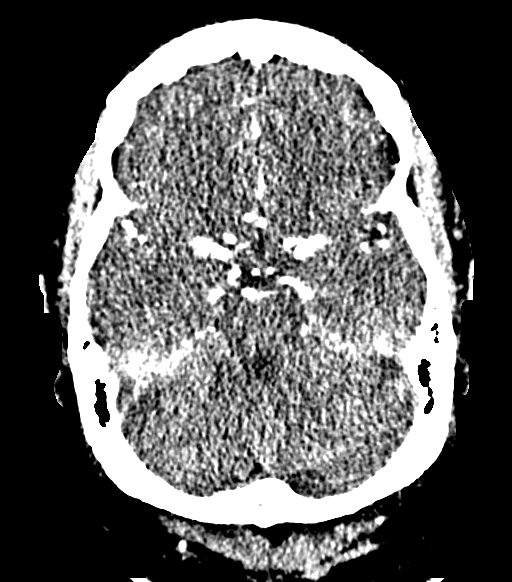
[im 68/147  brain]
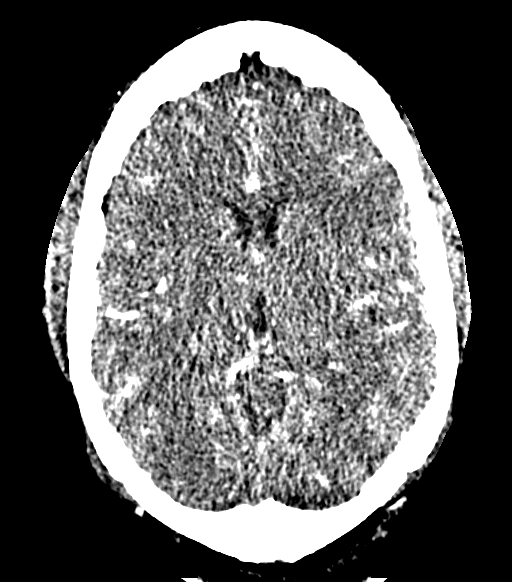
[im 79/147  brain]
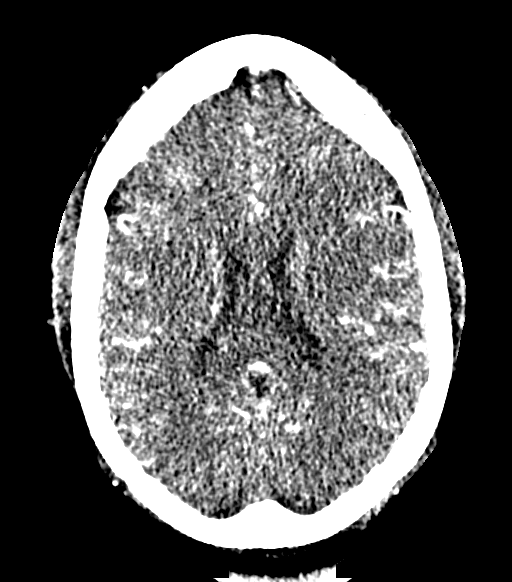
[im 102/147  brain]
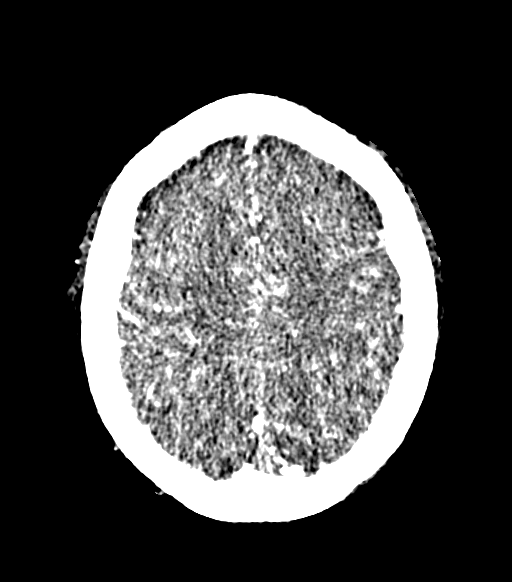

[Series 10: cor thin · coronal · 0.31mm/px · 3 of 199 slices shown]
[im 67/199  brain]
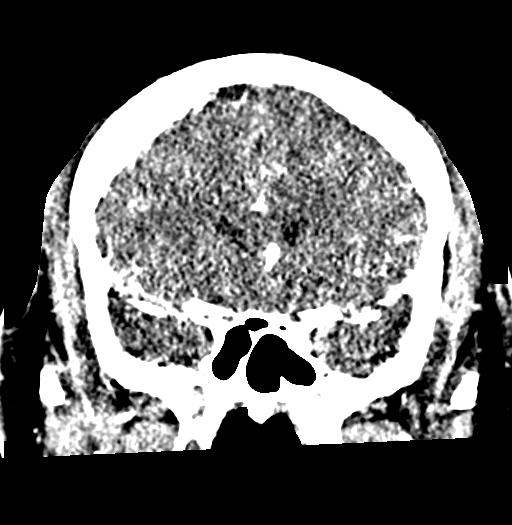
[im 100/199  brain]
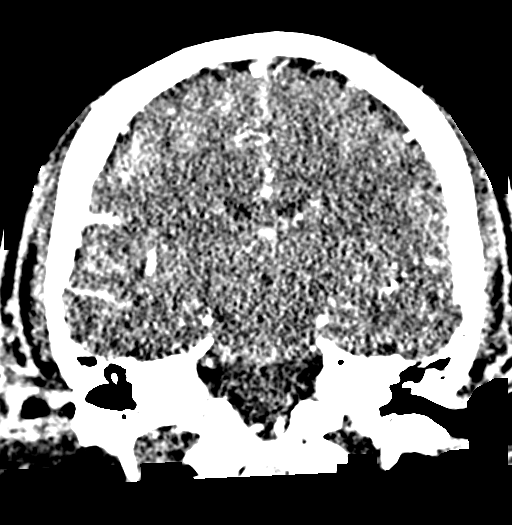
[im 133/199  brain]
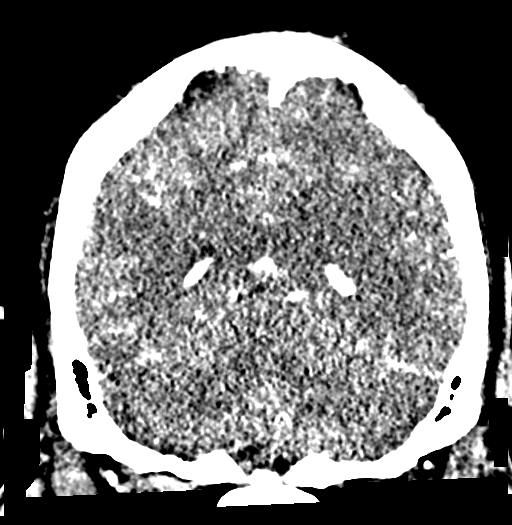

[Series 12: sag thin · sagittal · 0.32mm/px · 3 of 160 slices shown]
[im 40/160  brain]
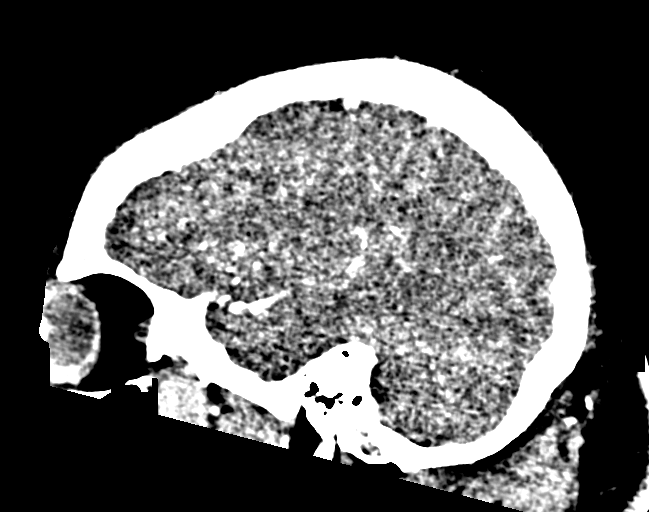
[im 80/160  brain]
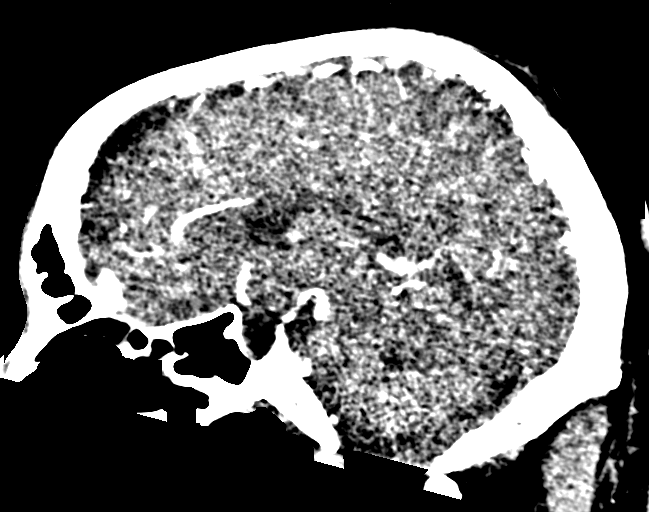
[im 120/160  brain]
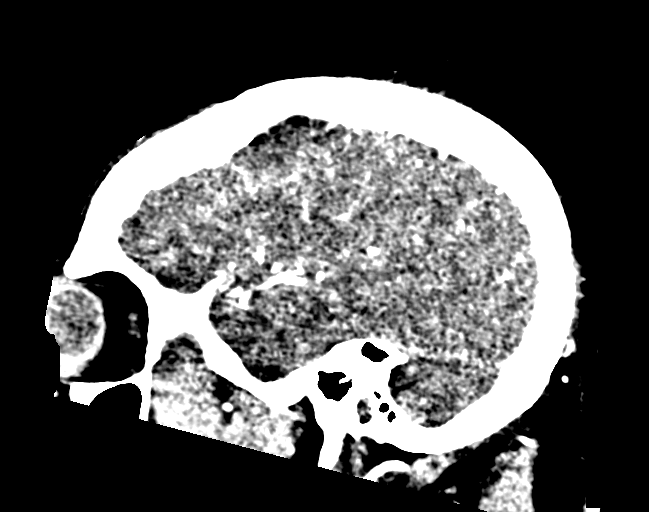

[17 of 47 positions shown; findings below may reference images not displayed]

FINDINGS: CT HEAD

Brain: No evidence of acute infarction, hemorrhage, hydrocephalus,
extra-axial collection or mass lesion/mass effect. Incidental
low-lying cerebellar tonsils 4 mm below foramen magnum. No
hydrocephalus, significant crowding of foramen magnum, mass effect
on brainstem, or elongated appearance of cerebellar tonsils.

Vascular: As below.

Skull: Normal. Negative for fracture or focal lesion.

Sinuses: Mild ethmoid sinus mucosal thickening.  Otherwise negative.

Orbits: No acute finding.

CTA HEAD

Anterior circulation: No significant stenosis, proximal occlusion,
aneurysm, or vascular malformation.

Posterior circulation: No significant stenosis, proximal occlusion,
aneurysm, or vascular malformation.

Venous sinuses: As permitted by contrast timing, patent.

Anatomic variants: Fenestrated anterior communicating artery. Large
bilateral posterior communicating arteries.

Delayed phase: No abnormal intracranial enhancement.
IMPRESSION: 1. No acute intracranial abnormality.
2. Incidental low-lying cerebellar tonsils. No secondary findings of
Chiari 1 malformation.
3. Patent anterior and posterior intracranial circulation. No large
vessel occlusion, aneurysm, or significant stenosis is identified.

By: Puchmeltr Jetmar M.D.

## 2018-05-05 LAB — HM DIABETES EYE EXAM

## 2018-05-14 ENCOUNTER — Ambulatory Visit (INDEPENDENT_AMBULATORY_CARE_PROVIDER_SITE_OTHER): Payer: Managed Care, Other (non HMO) | Admitting: Podiatry

## 2018-05-14 ENCOUNTER — Encounter: Payer: Self-pay | Admitting: Podiatry

## 2018-05-14 ENCOUNTER — Other Ambulatory Visit: Payer: Self-pay | Admitting: Podiatry

## 2018-05-14 ENCOUNTER — Ambulatory Visit (INDEPENDENT_AMBULATORY_CARE_PROVIDER_SITE_OTHER): Payer: Managed Care, Other (non HMO)

## 2018-05-14 VITALS — BP 146/94 | HR 92 | Resp 16 | Ht 69.0 in | Wt 315.0 lb

## 2018-05-14 DIAGNOSIS — M25571 Pain in right ankle and joints of right foot: Secondary | ICD-10-CM

## 2018-05-14 DIAGNOSIS — M7661 Achilles tendinitis, right leg: Secondary | ICD-10-CM

## 2018-05-14 MED ORDER — TRIAMCINOLONE ACETONIDE 10 MG/ML IJ SUSP
10.0000 mg | Freq: Once | INTRAMUSCULAR | Status: AC
  Administered 2018-05-14: 10 mg

## 2018-05-14 MED ORDER — DICLOFENAC SODIUM 75 MG PO TBEC: 75.0000 mg | DELAYED_RELEASE_TABLET | Freq: Two times a day (BID) | ORAL | 2 refills | Status: DC

## 2018-05-14 NOTE — Progress Notes (Signed)
   Subjective:    Patient ID: [name], male    DOB: 07/12/1976, 42 y.o.   MRN: [id number]  HPI    Review of Systems  All other systems reviewed and are negative.      Objective:   Physical Exam        Assessment & Plan:

## 2018-05-14 NOTE — Progress Notes (Signed)
Subjective:   Patient ID: [name], male   DOB: 42 y.o.   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  Patient is also obese which is complicating factor he puts a lot of stress on his feet and states in general his feet tend to ache.  States this is been going on for 4 to 6 months and patient does not smoke and likes to be active    Review of Systems  All other systems reviewed and are negative.       Objective:  Physical Exam  Constitutional: He appears well-developed and well-nourished.  Cardiovascular: Intact distal pulses.  Pulmonary/Chest: Effort normal.  Musculoskeletal: Normal range of motion.  Neurological: He is alert.  Skin: Skin is warm.  Nursing note and vitals reviewed.   Neurovascular status found to be intact muscle strength was adequate range of motion within normal limits with some restriction on the right foot secondary to previous clubfoot deformity.  Patient has equinus condition bilateral and has exquisite discomfort in the posterior medial aspect of the right heel with no discomfort central lateral aspect of the heel at the insertion of the Achilles into the calcaneus.  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  Patient [place] and is desperate to try to get this better and I did discuss injection explaining the procedure and risk associated with it.  Patient wants the procedure understanding risk and today I went ahead and I did a careful medial injection 3 mg dexamethasone Kenalog 5 mg  Xylocaine and then applied air fracture walker to completely immobilize the foot and lower leg giving him instructions on usage.  Reappoint for us to recheck again in the next several weeks after he returns from his trip  X-ray indicates that there is no indications of spurring or advanced pathology with significant navicular breeching secondary to previous clubfoot

## 2018-05-14 NOTE — Patient Instructions (Signed)

## 2018-05-18 ENCOUNTER — Telehealth: Payer: Self-pay | Admitting: Medical

## 2018-05-18 NOTE — Telephone Encounter (Signed)
Pt dropped of FMLA paperwork to be filled out for his migrines pt would like them faxed when filled out and also called when done so he will know he can be reached at [phone] and the fax number is [phone] put in your folder

## 2018-05-20 NOTE — Telephone Encounter (Signed)
Called and left voice mail for pt

## 2018-05-20 NOTE — Telephone Encounter (Signed)
Please have him submit his FMLA form to neurology.  I saw him back in January and we referred to neurology for uncontrollable headaches.   Generally if someone's headaches are so bad that they are missing work we get neurology involved, which is what we did 11/2017

## 2018-05-24 DIAGNOSIS — Z0289 Encounter for other administrative examinations: Secondary | ICD-10-CM

## 2018-05-26 ENCOUNTER — Telehealth: Payer: Self-pay | Admitting: *Deleted

## 2018-05-26 NOTE — Telephone Encounter (Signed)
FMLA forms completed, signed & sent to medical records for processing.  

## 2018-05-26 NOTE — Telephone Encounter (Signed)
I faxed pt FMLA paper work to [employer] @ [phone]

## 2018-05-27 ENCOUNTER — Other Ambulatory Visit: Payer: Self-pay | Admitting: Medical

## 2018-05-27 ENCOUNTER — Telehealth: Payer: Self-pay | Admitting: *Deleted

## 2018-05-27 MED ORDER — DICLOFENAC SODIUM 75 MG PO TBEC: 75.0000 mg | DELAYED_RELEASE_TABLET | Freq: Two times a day (BID) | ORAL | 0 refills | Status: DC

## 2018-05-27 NOTE — Telephone Encounter (Signed)
[facility] request refill of Diclofenac. Dr. Charlsie Merlesegal states refill once and pt needs an appt to be seen.

## 2018-05-27 NOTE — Telephone Encounter (Signed)
Is this ok to refill?  

## 2018-05-28 NOTE — Telephone Encounter (Signed)
Refill x 90, but no additional refills.  Schedule for physical within next few months , fasting.

## 2018-06-10 ENCOUNTER — Ambulatory Visit (INDEPENDENT_AMBULATORY_CARE_PROVIDER_SITE_OTHER): Payer: Managed Care, Other (non HMO) | Admitting: Podiatry

## 2018-06-10 ENCOUNTER — Encounter: Payer: Self-pay | Admitting: Podiatry

## 2018-06-10 DIAGNOSIS — M7661 Achilles tendinitis, right leg: Secondary | ICD-10-CM | POA: Diagnosis not present

## 2018-06-10 NOTE — Progress Notes (Signed)
Subjective:   Patient ID: [name] KiefEdward B [name], male   DOB: 42 y.o.   MRN: [id number]   HPI patient states he is some improved but still having some pain in the back of the right heel   ROS      Objective:  Physical Exam  Neurovascular status intact with discomfort still right heel posterior aspect medial side     Assessment:  Achilles tendinitis right present     Plan:  Reviewed condition recommended continued physical therapy anti-inflammatories discussed possibility long-term orthotics with heel lifts to lift up the heels.  He will use over-the-counter insoles currently will be seen back 4 weeks or earlier if needed

## 2018-06-15 ENCOUNTER — Ambulatory Visit: Payer: Self-pay | Admitting: Neurology

## 2018-07-07 ENCOUNTER — Encounter: Payer: Self-pay | Admitting: Medical

## 2018-07-07 ENCOUNTER — Telehealth: Payer: Self-pay

## 2018-07-07 NOTE — Telephone Encounter (Signed)

## 2018-07-07 NOTE — Telephone Encounter (Signed)
C and E

## 2018-07-08 ENCOUNTER — Ambulatory Visit: Payer: Managed Care, Other (non HMO) | Admitting: Podiatry

## 2018-07-08 ENCOUNTER — Telehealth: Payer: Self-pay

## 2018-07-08 NOTE — Telephone Encounter (Signed)
[name],  See [name] note about no show on 07-07-18.  Thank you

## 2018-07-08 NOTE — Telephone Encounter (Signed)
Left message on voicemail for patient to call back to schedule appointment that he no showed on 07-07-18.

## 2018-07-08 NOTE — Telephone Encounter (Signed)
Left message on voicemail for patient to call back to schedule appointment that he no showed on 07-07-18.   

## 2018-07-13 ENCOUNTER — Ambulatory Visit: Payer: Managed Care, Other (non HMO) | Admitting: Neurology

## 2018-07-13 ENCOUNTER — Encounter: Payer: Self-pay | Admitting: Neurology

## 2018-07-15 ENCOUNTER — Encounter: Payer: Self-pay | Admitting: Neurology

## 2018-07-15 ENCOUNTER — Ambulatory Visit: Payer: Managed Care, Other (non HMO) | Admitting: Neurology

## 2018-07-15 ENCOUNTER — Encounter: Payer: Self-pay | Admitting: Medical

## 2018-07-15 ENCOUNTER — Telehealth: Payer: Self-pay

## 2018-07-15 DIAGNOSIS — Z0000 Encounter for general adult medical examination without abnormal findings: Secondary | ICD-10-CM

## 2018-07-15 NOTE — Telephone Encounter (Signed)
Pt did not show for their appt with Dr. Frances FurbishAthar today and was late for his appt on 07/13/2018.

## 2018-07-15 NOTE — Telephone Encounter (Signed)
No show letter sent. [facility].

## 2018-08-13 ENCOUNTER — Other Ambulatory Visit: Payer: Self-pay | Admitting: Podiatry

## 2018-08-13 MED ORDER — DICLOFENAC SODIUM 75 MG PO TBEC: 75.0000 mg | DELAYED_RELEASE_TABLET | Freq: Two times a day (BID) | ORAL | 1 refills | Status: DC

## 2018-08-19 ENCOUNTER — Ambulatory Visit (INDEPENDENT_AMBULATORY_CARE_PROVIDER_SITE_OTHER): Payer: Managed Care, Other (non HMO) | Admitting: Podiatry

## 2018-08-19 ENCOUNTER — Encounter: Payer: Self-pay | Admitting: Podiatry

## 2018-08-19 DIAGNOSIS — M7661 Achilles tendinitis, right leg: Secondary | ICD-10-CM

## 2018-08-19 DIAGNOSIS — M7662 Achilles tendinitis, left leg: Secondary | ICD-10-CM

## 2018-08-20 NOTE — Progress Notes (Signed)
Subjective:   Patient ID: [name], male   DOB: 43 y.o.   MRN: [id number]   HPI Patient presents stating that the heel is somewhat improved but if she tries to be real active it still is sore and makes it hard for her to walk comfortably.  States that he is looking for a more permanent solution to his problem   ROS      Objective:  Physical Exam  Neurovascular status is intact with discomfort in the medial side of the posterior Achilles at its insertion with moderate flatfoot deformity as complicating factor     Assessment:  Chronic Achilles tendinitis with flattened arch is complicating factor and tight Achilles tendon     Plan:  We are trying to avoid surgery and today I went ahead and casted him for functional orthotics with heel lift to try to lift the heels up bilateral and take stress off his heels and hopefully we can prevent him from needing future surgery

## 2018-08-24 ENCOUNTER — Other Ambulatory Visit: Payer: Self-pay | Admitting: Medical

## 2018-09-07 ENCOUNTER — Other Ambulatory Visit: Payer: Self-pay

## 2018-09-07 MED ORDER — VERAPAMIL HCL 80 MG PO TABS: 80.0000 mg | ORAL_TABLET | Freq: Three times a day (TID) | ORAL | 0 refills | Status: DC

## 2018-09-13 ENCOUNTER — Encounter: Payer: Managed Care, Other (non HMO) | Admitting: Orthotics

## 2018-09-13 ENCOUNTER — Telehealth: Payer: Self-pay | Admitting: Podiatry

## 2018-09-13 NOTE — Telephone Encounter (Signed)
Canceled pts appt for today to puo. Pt was to call to confirm he wanted to order after he checked with his insurance them and we never received the call.  Pt confirmed he would like to proceed with ordering the orthotics and I will call pt when they come in to schedule an appt.

## 2018-09-17 ENCOUNTER — Ambulatory Visit (INDEPENDENT_AMBULATORY_CARE_PROVIDER_SITE_OTHER): Payer: Managed Care, Other (non HMO) | Admitting: Medical

## 2018-09-17 ENCOUNTER — Encounter: Payer: Self-pay | Admitting: Medical

## 2018-09-17 VITALS — BP 146/90 | HR 100 | Temp 97.9°F | Resp 16 | Ht 69.0 in | Wt 330.8 lb

## 2018-09-17 DIAGNOSIS — K760 Fatty (change of) liver, not elsewhere classified: Secondary | ICD-10-CM

## 2018-09-17 DIAGNOSIS — F329 Major depressive disorder, single episode, unspecified: Secondary | ICD-10-CM

## 2018-09-17 DIAGNOSIS — R5383 Other fatigue: Secondary | ICD-10-CM

## 2018-09-17 DIAGNOSIS — Z0000 Encounter for general adult medical examination without abnormal findings: Secondary | ICD-10-CM | POA: Diagnosis not present

## 2018-09-17 DIAGNOSIS — R4589 Other symptoms and signs involving emotional state: Secondary | ICD-10-CM

## 2018-09-17 DIAGNOSIS — E291 Testicular hypofunction: Secondary | ICD-10-CM

## 2018-09-17 DIAGNOSIS — I10 Essential (primary) hypertension: Secondary | ICD-10-CM | POA: Diagnosis not present

## 2018-09-17 DIAGNOSIS — G44009 Cluster headache syndrome, unspecified, not intractable: Secondary | ICD-10-CM

## 2018-09-17 DIAGNOSIS — G4733 Obstructive sleep apnea (adult) (pediatric): Secondary | ICD-10-CM | POA: Diagnosis not present

## 2018-09-17 DIAGNOSIS — E088 Diabetes mellitus due to underlying condition with unspecified complications: Secondary | ICD-10-CM

## 2018-09-17 DIAGNOSIS — Z9989 Dependence on other enabling machines and devices: Secondary | ICD-10-CM

## 2018-09-17 DIAGNOSIS — Z7189 Other specified counseling: Secondary | ICD-10-CM

## 2018-09-17 DIAGNOSIS — E785 Hyperlipidemia, unspecified: Secondary | ICD-10-CM

## 2018-09-17 DIAGNOSIS — Z7185 Encounter for immunization safety counseling: Secondary | ICD-10-CM

## 2018-09-17 DIAGNOSIS — E876 Hypokalemia: Secondary | ICD-10-CM

## 2018-09-17 DIAGNOSIS — E669 Obesity, unspecified: Secondary | ICD-10-CM

## 2018-09-17 DIAGNOSIS — E1169 Type 2 diabetes mellitus with other specified complication: Secondary | ICD-10-CM

## 2018-09-17 LAB — POCT URINALYSIS DIP (PROADVANTAGE DEVICE)
BILIRUBIN UA: NEGATIVE
BILIRUBIN UA: NEGATIVE mg/dL
Glucose, UA: NEGATIVE mg/dL
Leukocytes, UA: NEGATIVE
Nitrite, UA: NEGATIVE
PH UA: 6.5 (ref 5.0–8.0)
Protein Ur, POC: 100 mg/dL — AB
Specific Gravity, Urine: 1.02
Urobilinogen, Ur: NEGATIVE

## 2018-09-17 NOTE — Patient Instructions (Signed)
Recommendations:   I would like to invite you and your family to my church[employer] if you are not attending [employer] 8am, 9:30am, 11:30am  Contact info: [address], [place], [place] [address]  [phone] [employer] [email or web address]   I recommend you establish with a counselor to help improve on your current mood and mental health  You need to find time for rest and personal time or margin in your schedule.   You and I are both busy men with families, obligations, and responsibilities.   I have found a way to make some margin in my time and to get my priorities in check.   I know you can do the same.  Its time to do some soul searching about your job, what you want in your career and life at this time.  Its not good to be unhappy or stay in the place you are at currently.      we will call with lab results and other recommendations   I have listed some counselors below.    RESOURCES in [place]  If you are experiencing a mental health crisis or an emergency, please call 911 or go to the nearest emergency department.  [facility]   [phone] [facility]  [phone] 209 [address] [facility]   [phone]  Suicide Hotline 1-800-Suicide 956-486-0499(1-606-262-6274)  National Suicide Prevention Lifeline (401)462-78591-800-273-TALK  514-222-5882(1-612-643-4164)  Domestic Violence, Rape/Crisis - [facility] [phone]) [phone]  [employer] Domestic Violence Hotline 1-800-799-SAFE 979-701-0863(1-740-750-0742)  To report Child or Elder Abuse, please call: [facility] [place] Police Department  [phone] [facility]  [phone]  [facility] Crisis Line (425)[phone]  Teen Crisis line [phone] or 971-827-74731-(612)846-1157     Psychiatry and Counseling services  [facility] [address], [place], [place] [address] [phone]  Stevphen MeuseHolly Ingram, therapist Dr. Meredith Staggersarey Cottle, psychiatrist Dr. Beverly MilchGlenn Jennings, child psychiatrist   Dr. Len Blalockavid  Fuller [address], [place], [place] [address] [phone]   Dr. Milagros Evenerupinder Kaur, psychiatry [address], [place], [place] [address] [phone]   [facility] [address], [place], [place] [address] [phone]   [facility] [address], [place], [place] [address] [phone]    Counseling Services (NON- psychiatrist offices)  [facility] [address], [place], [place] [address] [phone]   [facility] [phone] [address], [place], [place] [address]   [facility] [phone]  [email or web address] [address] [place], [place], [place] [address]   Lenise ArenaMerrianne M. Charlyne MomLeff, therapist [phone] [address] [place], [place] [address]   [facility] [phone] [address], [place], [place] [address]   Glade LloydJill White-Huffman, therapist [phone]) [phone] [address], [place], [place] [address]   [facility] [phone] [address], [place], [place] [address]

## 2018-09-17 NOTE — Progress Notes (Signed)
Subjective: Chief Complaint  Patient presents with  . cpe    cpe fasting  eye exam few months ago   Here for a physical today  Medical team Dr. Cristie HemNorman Regal, podiatry Dr. Naomie DeanAntonia Ahern, neurology  Sees dentist  Eye doctor, Dr. Lynann BolognaMcFarland Anshika Pethtel, Kermit Baloavid S, PA-C here for primary care   Is fasting today Had flu shot at work recently  Doing just ok.  Was on 3rd shift, but recently moved to 1st shift.  Lately no energy, lack of focus, no interest in things.   Feels just ok.  Married, 3 children, still working with [employer].   Not exercising.  No hobbies.  Marriage is fine.   12/2017 niece died in car wreck.  Started feeling down before this.  Had very close bond with niece.  Works for [employer], hates his job but it pays the bills.  Been with [employer] 9 years.  He told CMA today that he sometimes has had suicidal ideation.  He feels overwhelmed in general.  He notes that his family is the bright spot but other than that feels overwhelmed and is not happy in general  Last visit here with me was in January 2019 for med check and chronic disease issues  Hypertension- back in January we changed to plain benazepril, and started verapamil 1 tablet 3 times a day for both blood pressure and headaches along with referral to neurology for headaches and sleep study.  He had been on benazepril HCT but was having low potassium with this.  He is not checking blood sugar or blood pressure regularly  Reviewed their medical, surgical, family, social, medication, and allergy history and updated chart as appropriate.  Past Medical History:  Diagnosis Date  . Hypertension   . Migraine   . Nasal congestion 10/03/2014  . Non-insulin dependent type 2 diabetes mellitus (HCC)   . Obesity   . Sleep apnea    uses CPAP nightly  . Wears contact lenses     Past Surgical History:  Procedure Laterality Date  . CHOLECYSTECTOMY N/A 10/09/2014   Procedure: LAPAROSCOPIC CHOLECYSTECTOMY;  Surgeon: Abigail Miyamotoouglas  Blackman, MD;  Location: [facility];  Service: General;  Laterality: N/A;    Social History   Socioeconomic History  . Marital status: Married    Spouse name: Not on file  . Number of children: 1  . Years of education: Not on file  . Highest education level: Some college, no degree  Occupational History  . Not on file  Social Needs  . Financial resource strain: Not on file  . Food insecurity:    Worry: Not on file    Inability: Not on file  . Transportation needs:    Medical: Not on file    Non-medical: Not on file  Tobacco Use  . Smoking status: Never Smoker  . Smokeless tobacco: Never Used  Substance and Sexual Activity  . Alcohol use: Yes    Comment: occasional  . Drug use: No  . Sexual activity: Yes  Lifestyle  . Physical activity:    Days per week: Not on file    Minutes per session: Not on file  . Stress: Not on file  Relationships  . Social connections:    Talks on phone: Not on file    Gets together: Not on file    Attends religious service: Not on file    Active member of club or organization: Not on file    Attends meetings of clubs or organizations:  Not on file    Relationship status: Not on file  . Intimate partner violence:    Fear of current or ex partner: Not on file    Emotionally abused: Not on file    Physically abused: Not on file    Forced sexual activity: Not on file  Other Topics Concern  . Not on file  Social History Narrative   Lives at home with wife & 3 children and a dog.    Married, has 10yo boy.  Wife has 2 children, so 3 in the household.  Exercise some with walking.  Works as Teacher, early years/pre for [employer].     Drinks 1-2 cups of caffeine daily   Right handed   09/2018        Family History  Problem Relation Age of Onset  . Hypertension Mother   . Kidney disease Father   . Diabetes Maternal Aunt   . Diabetes Paternal Aunt   . Cancer Maternal Grandfather        throat  . Stroke Paternal Grandmother   .  Alcohol abuse Paternal Grandmother   . Heart disease Neg Hx      Current Outpatient Medications:  .  diclofenac (VOLTAREN) 75 MG EC tablet, Take 1 tablet (75 mg total) by mouth 2 (two) times daily., Disp: 90 tablet, Rfl: 1 .  potassium chloride SA (K-DUR,KLOR-CON) 20 MEQ tablet, Take 1 tablet (20 mEq total) by mouth 2 (two) times daily., Disp: 180 tablet, Rfl: 3 .  pravastatin (PRAVACHOL) 20 MG tablet, TAKE 1 TABLET DAILY, Disp: 90 tablet, Rfl: 0 .  tamsulosin (FLOMAX) 0.4 MG CAPS capsule, TAKE 1 CAPSULE DAILY AFTER SUPPER, Disp: 90 capsule, Rfl: 0 .  verapamil (CALAN) 80 MG tablet, Take 1 tablet (80 mg total) by mouth 3 (three) times daily., Disp: 270 tablet, Rfl: 0 .  HYDROcodone-acetaminophen (NORCO) 5-325 MG tablet, Take 1 tablet by mouth every 6 (six) hours as needed for moderate pain. (Patient not taking: Reported on 09/17/2018), Disp: 15 tablet, Rfl: 0 .  SUMAtriptan (IMITREX) 6 MG/0.5ML SOLN injection, Take one dose at headache onset, can take additional dose 2hrs later if needed. No more then 2 injections in 24hrs (Patient not taking: Reported on 09/17/2018), Disp: 8 vial, Rfl: 1  No Known Allergies     Review of Systems Constitutional: -fever, -chills, -sweats, -unexpected weight change, -decreased appetite, -fatigue Allergy: -sneezing, -itching, -congestion Dermatology: -changing moles, --rash, -lumps ENT: -runny nose, -ear pain, -sore throat, -hoarseness, -sinus pain, -teeth pain, - ringing in ears, -hearing loss, -nosebleeds Cardiology: -chest pain, -palpitations, -swelling, -difficulty breathing when lying flat, -waking up short of breath Respiratory: -cough, -shortness of breath, -difficulty breathing with exercise or exertion, -wheezing, -coughing up blood Gastroenterology: -abdominal pain, -nausea, -vomiting, -diarrhea, -constipation, -blood in stool, -changes in bowel movement, -difficulty swallowing or eating Hematology: -bleeding, -bruising  Musculoskeletal: -joint  aches, -muscle aches, -joint swelling, -back pain, -neck pain, -cramping, -changes in gait Ophthalmology: denies vision changes, eye redness, itching, discharge Urology: -burning with urination, -difficulty urinating, -blood in urine, -urinary frequency, -urgency, -incontinence Neurology: -headache, -weakness, -tingling, -numbness, -memory loss, -falls, -dizziness Psychology: +depressed mood, -agitation, -sleep problems Male GU: no testicular mass, pain, no lymph nodes swollen, no swelling, no rash.     Objective:  BP (!) 146/90   Pulse 100   Temp 97.9 F (36.6 C) (Oral)   Resp 16   Ht 5\' 9"  (1.753 m)   Wt (!) 330 lb 12.8 oz (150 kg)   SpO2  95%   BMI 48.85 kg/m   General appearance: alert, no distress, WD/WN, African American male Skin: no worrisome lesions HEENT: normocephalic, conjunctiva/corneas normal, sclerae anicteric, PERRLA, EOMi, nares patent, no discharge or erythema, pharynx normal Oral cavity: MMM, tongue normal, teeth normal Neck: supple, no lymphadenopathy, no thyromegaly, no masses, normal ROM, no bruits Chest: non tender, normal shape and expansion Heart: RRR, normal S1, S2, no murmurs Lungs: CTA bilaterally, no wheezes, rhonchi, or rales Abdomen: +bs, soft, non tender, non distended, no masses, no hepatomegaly, no splenomegaly, no bruits Back: non tender, normal ROM, no scoliosis Musculoskeletal: upper extremities non tender, no obvious deformity, normal ROM throughout, lower extremities non tender, no obvious deformity, normal ROM throughout Extremities: no edema, no cyanosis, no clubbing Pulses: 2+ symmetric, upper and lower extremities, normal cap refill Neurological: alert, oriented x 3, CN2-12 intact, strength normal upper extremities and lower extremities, sensation normal throughout, DTRs 2+ throughout, no cerebellar signs, gait normal Psychiatric: normal affect, behavior normal, pleasant  GU/rectal - declined   Assessment and Plan :   Encounter  Diagnoses  Name Primary?  . Routine general medical examination at a health care facility Yes  . Essential hypertension   . OSA on CPAP   . Fatty liver   . Diabetes mellitus due to underlying condition with complication, without long-term current use of insulin (HCC)   . Diabetes mellitus type 2 in obese (HCC)   . Hyperlipidemia associated with type 2 diabetes mellitus (HCC)   . Hypogonadism in male   . Cluster headache, not intractable, unspecified chronicity pattern   . Decreased energy   . Fatigue, unspecified type   . Hypokalemia   . Vaccine counseling   . Depressed mood     Physical exam - discussed and counseled on healthy lifestyle, diet, exercise, preventative care, vaccinations, sick and well care, proper use of emergency dept and after hours care, and addressed their concerns.    Health screening: See your eye doctor yearly for routine vision care. See your dentist yearly for routine dental care including hygiene visits twice yearly.  Cancer screening Advised monthly self testicular exam  Vaccinations: Advised yearly influenza vaccine   We spent the bulk of the visit discussing his depressed mood, at times thoughts of suicidal ideation, counseled on moving things in the right direction.  I recommend he start seeing a counselor.  I recommend he reflect back on his current situation and consider a career change since this seems to be a big part of his frustration.  We discussed having personal time, time for margin.  Discussed getting his priorities in check.  Advised if any thoughts of suicide to go to [facility] emergency department.  We will follow-up with him pending labs and encouraged him again to get help  Discussed the need for routine follow-up here for med checks, monitoring of blood pressure and blood sugar at home, counseled on the need for compliance with medications and CPAP.  He is not currently exercising, encouraged him to make this part of his  routine  Hypertension-continue current medication  OSA-continue CPAP  Hyperlipidemia- continue statin   Nain was seen today for cpe.  Diagnoses and all orders for this visit:  Routine general medical examination at a health care facility -     POCT Urinalysis DIP (Proadvantage Device) -     Comprehensive metabolic panel -     CBC -     Lipid panel -     Hemoglobin A1c -  HM DIABETES EYE EXAM -     Microalbumin / creatinine urine ratio -     HM DIABETES FOOT EXAM  Essential hypertension  OSA on CPAP  Fatty liver  Diabetes mellitus due to underlying condition with complication, without long-term current use of insulin (HCC)  Diabetes mellitus type 2 in obese (HCC) -     Hemoglobin A1c  Hyperlipidemia associated with type 2 diabetes mellitus (HCC) -     Lipid panel  Hypogonadism in male  Cluster headache, not intractable, unspecified chronicity pattern  Decreased energy  Fatigue, unspecified type  Hypokalemia  Vaccine counseling  Depressed mood   Follow-up pending labs

## 2018-09-18 LAB — LIPID PANEL
CHOLESTEROL TOTAL: 184 mg/dL (ref 100–199)
Chol/HDL Ratio: 5 ratio (ref 0.0–5.0)
HDL: 37 mg/dL — AB (ref >39)
LDL CALC: 125 mg/dL — AB (ref 0–99)
Triglycerides: 110 mg/dL (ref 0–149)
VLDL CHOLESTEROL CAL: 22 mg/dL (ref 5–40)

## 2018-09-18 LAB — COMPREHENSIVE METABOLIC PANEL
ALBUMIN: 4.6 g/dL (ref 3.5–5.5)
ALK PHOS: 63 IU/L (ref 39–117)
ALT: 32 IU/L (ref 0–44)
AST: 18 IU/L (ref 0–40)
Albumin/Globulin Ratio: 1.9 (ref 1.2–2.2)
BUN / CREAT RATIO: 10 (ref 9–20)
BUN: 12 mg/dL (ref 6–24)
Bilirubin Total: 1.3 mg/dL — ABNORMAL HIGH (ref 0.0–1.2)
CALCIUM: 9.4 mg/dL (ref 8.7–10.2)
CO2: 29 mmol/L (ref 20–29)
CREATININE: 1.17 mg/dL (ref 0.76–1.27)
Chloride: 98 mmol/L (ref 96–106)
GFR calc Af Amer: 90 mL/min/{1.73_m2} (ref >59)
GFR calc non Af Amer: 78 mL/min/{1.73_m2} (ref >59)
GLUCOSE: 110 mg/dL — AB (ref 65–99)
Globulin, Total: 2.4 g/dL (ref 1.5–4.5)
Potassium: 2.9 mmol/L — ABNORMAL LOW (ref 3.5–5.2)
Sodium: 142 mmol/L (ref 134–144)
Total Protein: 7 g/dL (ref 6.0–8.5)

## 2018-09-18 LAB — MICROALBUMIN / CREATININE URINE RATIO
Creatinine, Urine: 241.1 mg/dL
MICROALB/CREAT RATIO: 539.9 mg/g{creat} — AB (ref 0.0–30.0)
Microalbumin, Urine: 1301.6 ug/mL

## 2018-09-18 LAB — CBC
HEMATOCRIT: 42.3 % (ref 37.5–51.0)
Hemoglobin: 14.6 g/dL (ref 13.0–17.7)
MCH: 29.6 pg (ref 26.6–33.0)
MCHC: 34.5 g/dL (ref 31.5–35.7)
MCV: 86 fL (ref 79–97)
PLATELETS: 164 10*3/uL (ref 150–450)
RBC: 4.93 x10E6/uL (ref 4.14–5.80)
RDW: 14.2 % (ref 12.3–15.4)
WBC: 4.5 10*3/uL (ref 3.4–10.8)

## 2018-09-18 LAB — HEMOGLOBIN A1C
Est. average glucose Bld gHb Est-mCnc: 128 mg/dL
Hgb A1c MFr Bld: 6.1 % — ABNORMAL HIGH (ref 4.8–5.6)

## 2018-09-20 ENCOUNTER — Other Ambulatory Visit: Payer: Self-pay | Admitting: Medical

## 2018-09-23 ENCOUNTER — Other Ambulatory Visit: Payer: Self-pay | Admitting: Medical

## 2018-09-23 MED ORDER — POTASSIUM CHLORIDE CRYS ER 20 MEQ PO TBCR: 20.0000 meq | EXTENDED_RELEASE_TABLET | Freq: Two times a day (BID) | ORAL | 3 refills | Status: DC

## 2018-09-23 MED ORDER — BENAZEPRIL HCL 10 MG PO TABS: 10.0000 mg | ORAL_TABLET | Freq: Every day | ORAL | 2 refills | Status: DC

## 2018-09-23 MED ORDER — BENAZEPRIL HCL 10 MG PO TABS: 10.0000 mg | ORAL_TABLET | Freq: Every day | ORAL | 0 refills | Status: DC

## 2018-09-23 MED ORDER — ROSUVASTATIN CALCIUM 20 MG PO TABS: 20.0000 mg | ORAL_TABLET | Freq: Every day | ORAL | 3 refills | Status: DC

## 2018-09-23 MED ORDER — DAPAGLIFLOZIN PROPANEDIOL 5 MG PO TABS: 5.0000 mg | ORAL_TABLET | Freq: Every day | ORAL | 2 refills | Status: DC

## 2018-09-23 MED ORDER — TAMSULOSIN HCL 0.4 MG PO CAPS: ORAL_CAPSULE | ORAL | 3 refills | Status: DC

## 2018-09-23 MED ORDER — DAPAGLIFLOZIN PROPANEDIOL 5 MG PO TABS: 5.0000 mg | ORAL_TABLET | Freq: Every day | ORAL | 0 refills | Status: DC

## 2018-09-23 MED ORDER — ROSUVASTATIN CALCIUM 20 MG PO TABS: 20.0000 mg | ORAL_TABLET | Freq: Every day | ORAL | 0 refills | Status: DC

## 2018-09-25 ENCOUNTER — Telehealth: Payer: Self-pay | Admitting: Medical

## 2018-09-25 NOTE — Telephone Encounter (Signed)
P.A. FARXIGA 

## 2018-09-26 NOTE — Telephone Encounter (Signed)
PA Approved

## 2018-10-05 ENCOUNTER — Encounter: Payer: Managed Care, Other (non HMO) | Admitting: Orthotics

## 2018-10-10 ENCOUNTER — Encounter (HOSPITAL_COMMUNITY): Payer: Self-pay

## 2018-10-10 ENCOUNTER — Ambulatory Visit (HOSPITAL_COMMUNITY)
Admission: EM | Admit: 2018-10-10 | Discharge: 2018-10-10 | Disposition: A | Discharge status: Home / Self Care | Payer: Managed Care, Other (non HMO) | Attending: Physician Assistant | Admitting: Physician Assistant

## 2018-10-10 DIAGNOSIS — S81802A Unspecified open wound, left lower leg, initial encounter: Secondary | ICD-10-CM

## 2018-10-10 DIAGNOSIS — L03116 Cellulitis of left lower limb: Secondary | ICD-10-CM

## 2018-10-10 MED ORDER — MUPIROCIN 2 % EX OINT: 1.0000 "application " | TOPICAL_OINTMENT | Freq: Two times a day (BID) | CUTANEOUS | 0 refills | Status: DC

## 2018-10-10 MED ORDER — CEPHALEXIN 500 MG PO CAPS: 500.0000 mg | ORAL_CAPSULE | Freq: Four times a day (QID) | ORAL | 0 refills | Status: DC

## 2018-10-10 NOTE — ED Triage Notes (Signed)
Pt cc x 2 weeks pt has a wound on the back of his left leg.

## 2018-10-10 NOTE — ED Provider Notes (Signed)
[facility]    CSN: [id number] Arrival date & time: 10/10/18  1555     History   Chief Complaint Chief Complaint  Patient presents with  . Insect Bite    HPI [name] is a 42 y.o. male.   42 year old male with history of hypertension, diabetes, sleep apnea, comes in for wound to the left lower extremity for the past 2 weeks.  Thinks it started out as insect bite, now with open wound, swelling, and erythema around the wound.  Denies fever, chills, night sweats.  Has been cleaning wound with alcohol, and dressing with Band-Aid.     Past Medical History:  Diagnosis Date  . Hypertension   . Migraine   . Nasal congestion 10/03/2014  . Non-insulin dependent type 2 diabetes mellitus (HCC)   . Obesity   . Sleep apnea    uses CPAP nightly  . Wears contact lenses     Patient Active Problem List   Diagnosis Date Noted  . Cluster headache 12/23/2017  . Hyperlipidemia associated with type 2 diabetes mellitus (HCC) 10/08/2017  . Hypogonadism in male 05/25/2017  . Vaccine counseling 05/25/2017  . Low testosterone 08/26/2016  . Screening for prostate cancer 08/26/2016  . RUQ abdominal pain 08/19/2016  . Lower abdominal pain 08/19/2016  . Chest wall pain 08/19/2016  . Decreased energy 08/19/2016  . Fatigue 08/19/2016  . Fatty liver 08/19/2016  . Need for prophylactic vaccination and inoculation against influenza 08/19/2016  . Hypokalemia 04/09/2016  . Encounter for health maintenance examination in adult 07/05/2015  . Diabetes mellitus type 2 in obese (HCC) 07/05/2015  . Essential hypertension 07/05/2015  . OSA on CPAP 07/05/2015  . S/P cholecystectomy 07/05/2015  . Adverse effects of medication 07/05/2015  . Noncompliance 07/05/2015  . Influenza vaccination declined 07/05/2015  . Morbid obesity (HCC) 07/05/2015    Past Surgical History:  Procedure Laterality Date  . CHOLECYSTECTOMY N/A 10/09/2014   Procedure: LAPAROSCOPIC CHOLECYSTECTOMY;  Surgeon:  Abigail Miyamotoouglas Blackman, MD;  Location: [facility];  Service: General;  Laterality: N/A;       Home Medications    Prior to Admission medications   Medication Sig Start Date End Date Taking? Authorizing Provider  benazepril (LOTENSIN) 10 MG tablet Take 1 tablet (10 mg total) by mouth daily. 09/23/18 12/22/18  [name], Kermit Baloavid S, PA-C  cephALEXin (KEFLEX) 500 MG capsule Take 1 capsule (500 mg total) by mouth 4 (four) times daily. 10/10/18   Cathie HoopsYu, Amy V, PA-C  dapagliflozin propanediol (FARXIGA) 5 MG TABS tablet Take 5 mg by mouth daily. 09/23/18   [name], Kermit Baloavid S, PA-C  diclofenac (VOLTAREN) 75 MG EC tablet Take 1 tablet (75 mg total) by mouth 2 (two) times daily. 08/13/18   Lenn Sinkegal, Norman S, DPM  mupirocin ointment (BACTROBAN) 2 % Apply 1 application topically 2 (two) times daily. 10/10/18   Cathie HoopsYu, Amy V, PA-C  potassium chloride SA (K-DUR,KLOR-CON) 20 MEQ tablet Take 1 tablet (20 mEq total) by mouth 2 (two) times daily. 09/23/18   [name], Kermit Baloavid S, PA-C  rosuvastatin (CRESTOR) 20 MG tablet Take 1 tablet (20 mg total) by mouth at bedtime. 09/23/18 09/23/19  [name], Kermit Baloavid S, PA-C  SUMAtriptan (IMITREX) 6 MG/0.5ML SOLN injection Take one dose at headache onset, can take additional dose 2hrs later if needed. No more then 2 injections in 24hrs Patient not taking: Reported on 09/17/2018 02/12/18   Jac Canavanysinger, David S, PA-C  tamsulosin (FLOMAX) 0.4 MG CAPS capsule TAKE 1 CAPSULE DAILY AFTER SUPPER 09/23/18  [name], [name], PA-C  verapamil (CALAN) 80 MG tablet Take 1 tablet (80 mg total) by mouth 3 (three) times daily. 09/07/18   [name], [name], PA-C    Family History Family History  Problem Relation Age of Onset  . Hypertension Mother   . Kidney disease Father   . Diabetes Maternal Aunt   . Diabetes Paternal Aunt   . Cancer Maternal Grandfather        throat  . Stroke Paternal Grandmother   . Alcohol abuse Paternal Grandmother   . Heart disease Neg Hx     Social History Social  History   Tobacco Use  . Smoking status: Never Smoker  . Smokeless tobacco: Never Used  Substance Use Topics  . Alcohol use: Yes    Comment: occasional  . Drug use: No     Allergies   Patient has no known allergies.   Review of Systems Review of Systems  Reason unable to perform ROS: See HPI as above.     Physical Exam Triage Vital Signs ED Triage Vitals  Enc Vitals Group     BP 10/10/18 1647 (!) 174/106     Pulse Rate 10/10/18 1647 78     Resp 10/10/18 1647 18     Temp 10/10/18 1647 97.6 F (36.4 C)     Temp src --      SpO2 10/10/18 1647 99 %     Weight 10/10/18 1649 (!) 330 lb (149.7 kg)     Height --      Head Circumference --      Peak Flow --      Pain Score --      Pain Loc --      Pain Edu? --      Excl. in GC? --    No data found.  Updated Vital Signs BP (!) 174/106 (BP Location: Right Arm)   Pulse 78   Temp 97.6 F (36.4 C)   Resp 18   Wt (!) 330 lb (149.7 kg)   SpO2 99%   BMI 48.73 kg/m    Physical Exam  Constitutional: He is oriented to person, place, and time. He appears well-developed and well-nourished. No distress.  HENT:  Head: Normocephalic and atraumatic.  Eyes: Pupils are equal, round, and reactive to light. Conjunctivae are normal.  Neurological: He is alert and oriented to person, place, and time.  Skin: He is not diaphoretic.  See picture below.  Open wound of about 1 cm x 1.5 cm.  Appears to be a large bulla that has erupted.  Surrounding erythema with induration.  Mild warmth.  Mild tenderness to palpation.  Pitting edema to the lower leg.         UC Treatments / Results  Labs (all labs ordered are listed, but only abnormal results are displayed) Labs Reviewed - No data to display  EKG None  Radiology No results found.  Procedures Procedures (including critical care time)  Medications Ordered in UC Medications - No data to display  Initial Impression / Assessment and Plan / UC Course  I have reviewed the  triage vital signs and the nursing notes.  Pertinent labs & imaging results that were available during my care of the patient were reviewed by me and considered in my medical decision making (see chart for details).    Patient to start Keflex as directed.  Wound care instructions given.  Return precautions given.  Otherwise follow-up with PCP for further monitoring needed.  Patient expresses understanding and agrees to plan.  Final Clinical Impressions(s) / UC Diagnoses   Final diagnoses:  Open wound of left lower leg, initial encounter  Cellulitis of left lower extremity    ED Prescriptions    Medication Sig Dispense Auth. Provider   cephALEXin (KEFLEX) 500 MG capsule Take 1 capsule (500 mg total) by mouth 4 (four) times daily. 28 capsule Yu, Amy V, PA-C   mupirocin ointment (BACTROBAN) 2 % Apply 1 application topically 2 (two) times daily. 22 g Threasa Alpha, [place] 10/10/18 [phone]

## 2018-10-10 NOTE — Discharge Instructions (Signed)
Start keflex as directed. Keep wound clean and dry. You can clean gently with soap and water. You can dress wound with bactroban ointment. Do not soak area in water. Monitor for spreading redness, increased warmth, increased swelling, fever, follow up for reevaluation needed. Otherwise, follow up with your PCP for further monitoring of wound healing.

## 2018-10-16 NOTE — Telephone Encounter (Signed)
Left message for pt that P.A. approved

## 2018-11-02 ENCOUNTER — Encounter: Payer: Self-pay | Admitting: Medical

## 2018-11-02 ENCOUNTER — Ambulatory Visit (INDEPENDENT_AMBULATORY_CARE_PROVIDER_SITE_OTHER): Payer: Managed Care, Other (non HMO) | Admitting: Medical

## 2018-11-02 VITALS — BP 138/96 | HR 83 | Temp 98.0°F | Wt 337.6 lb

## 2018-11-02 DIAGNOSIS — E1169 Type 2 diabetes mellitus with other specified complication: Secondary | ICD-10-CM | POA: Diagnosis not present

## 2018-11-02 DIAGNOSIS — T148XXA Other injury of unspecified body region, initial encounter: Secondary | ICD-10-CM

## 2018-11-02 DIAGNOSIS — L089 Local infection of the skin and subcutaneous tissue, unspecified: Secondary | ICD-10-CM | POA: Diagnosis not present

## 2018-11-02 DIAGNOSIS — T148XXD Other injury of unspecified body region, subsequent encounter: Secondary | ICD-10-CM

## 2018-11-02 DIAGNOSIS — E669 Obesity, unspecified: Secondary | ICD-10-CM

## 2018-11-02 DIAGNOSIS — L24A9 Irritant contact dermatitis due friction or contact with other specified body fluids: Secondary | ICD-10-CM

## 2018-11-02 MED ORDER — SILVER SULFADIAZINE 1 % EX CREA: 1.0000 "application " | TOPICAL_CREAM | Freq: Every day | CUTANEOUS | 1 refills | Status: DC

## 2018-11-02 MED ORDER — DOXYCYCLINE HYCLATE 100 MG PO TABS: 100.0000 mg | ORAL_TABLET | Freq: Two times a day (BID) | ORAL | 0 refills | Status: DC

## 2018-11-02 NOTE — Patient Instructions (Addendum)
Recommendations  Begin doxycycline oral tablet twice daily for 10 days  Begin Silvadene cream topically twice daily keeping the wound covered with a bandage  Shower daily but use a cough and soapy water to clean the area with a little bit of pressure and light scrubbing  Lets recheck in 10 days, however if worse in the meantime call back   If worse redness, fever, pain, then call or go to the emergency department

## 2018-11-02 NOTE — Progress Notes (Signed)
Subjective: Chief Complaint  Patient presents with  . other    spider bite  follow up   Here today with wife.  He reports 3-week history of left leg wound after reportedly thought to have a spider bite.  He went to urgent care 3 weeks ago for the same.  He did not actually feel a bite or see a bite from an insect.  He denies any other injury.  He was put on Keflex and Bactroban ointment to urgent care.  He use all that without any improvement.  He is not worse but the wound is well-healed.  He has not checked his sugars real regularly but his numbers have been a little high in the mornings in the last week.  He has some discomfort.  The wound is draining.  No fever.  No nausea.  No vomiting no body aches no chills. No other aggravating or relieving factors. No other complaint.   Objective: BP (!) 138/96 (BP Location: Left Arm, Patient Position: Sitting)   Pulse 83   Temp 98 F (36.7 C)   Wt (!) 337 lb 9.6 oz (153.1 kg)   SpO2 98%   BMI 49.85 kg/m   General: Well-developed well-nourished no acute distress Left posterior leg inferior portion of calf with 6 cm round brown somewhat thickened tissue with a 4 cm diameter central round area of erythema and serous pus drainage.  No fluctuance, no warmth, no redness surrounding the wound. He chronically has a asymmetrically larger left calf compared to the right. Pulses are 1+ lower extremities    Assessment: Encounter Diagnoses  Name Primary?  [name]. Wound healing, delayed Yes  . Wound drainage   . Skin infection   . Diabetes mellitus type 2 in obese [facility](HCC)     Plan: We discussed the findings, possible complications of prompt immediate recheck.  We discussed instructions below  Patient Instructions  Recommendations  Begin doxycycline oral tablet twice daily for 10 days  Begin Silvadene cream topically twice daily keeping the wound covered with a bandage  Shower daily but use a cough and soapy water to clean the area with a little  bit of pressure and light scrubbing  Lets recheck in 10 days, however if worse in the meantime call back   If worse redness, fever, pain, then call or go to the emergency department    Ramon Dredgedward was seen today for other.  Diagnoses and all orders for this visit:  Wound healing, delayed -     WOUND CULTURE  Wound drainage -     WOUND CULTURE  Skin infection  Diabetes mellitus type 2 in obese (HCC)  Other orders -     doxycycline (VIBRA-TABS) 100 MG tablet; Take 1 tablet (100 mg total) by mouth 2 (two) times daily. -     silver sulfADIAZINE (SILVADENE) 1 % cream; Apply 1 application topically daily.

## 2018-11-05 LAB — WOUND CULTURE: ORGANISM ID, BACTERIA: NONE SEEN

## 2018-11-10 ENCOUNTER — Other Ambulatory Visit: Payer: Self-pay | Admitting: Medical

## 2018-11-10 NOTE — Telephone Encounter (Signed)
Is this ok to refill?  

## 2018-11-12 ENCOUNTER — Other Ambulatory Visit: Payer: Self-pay

## 2018-11-12 ENCOUNTER — Telehealth: Payer: Self-pay | Admitting: Medical

## 2018-11-12 ENCOUNTER — Ambulatory Visit (INDEPENDENT_AMBULATORY_CARE_PROVIDER_SITE_OTHER): Payer: Managed Care, Other (non HMO) | Admitting: Medical

## 2018-11-12 ENCOUNTER — Encounter: Payer: Self-pay | Admitting: Medical

## 2018-11-12 VITALS — BP 130/84 | HR 84 | Temp 98.2°F | Resp 16 | Ht 69.0 in | Wt 342.4 lb

## 2018-11-12 DIAGNOSIS — L089 Local infection of the skin and subcutaneous tissue, unspecified: Secondary | ICD-10-CM | POA: Diagnosis not present

## 2018-11-12 DIAGNOSIS — R0989 Other specified symptoms and signs involving the circulatory and respiratory systems: Secondary | ICD-10-CM

## 2018-11-12 DIAGNOSIS — T148XXD Other injury of unspecified body region, subsequent encounter: Secondary | ICD-10-CM | POA: Diagnosis not present

## 2018-11-12 DIAGNOSIS — E669 Obesity, unspecified: Secondary | ICD-10-CM

## 2018-11-12 DIAGNOSIS — T148XXA Other injury of unspecified body region, initial encounter: Secondary | ICD-10-CM

## 2018-11-12 DIAGNOSIS — L24A9 Irritant contact dermatitis due friction or contact with other specified body fluids: Secondary | ICD-10-CM | POA: Insufficient documentation

## 2018-11-12 DIAGNOSIS — I10 Essential (primary) hypertension: Secondary | ICD-10-CM

## 2018-11-12 DIAGNOSIS — E1169 Type 2 diabetes mellitus with other specified complication: Secondary | ICD-10-CM

## 2018-11-12 MED ORDER — DOXYCYCLINE HYCLATE 100 MG PO TABS: 100.0000 mg | ORAL_TABLET | Freq: Two times a day (BID) | ORAL | 0 refills | Status: DC

## 2018-11-12 MED ORDER — VERAPAMIL HCL 80 MG PO TABS: 80.0000 mg | ORAL_TABLET | Freq: Three times a day (TID) | ORAL | 4 refills | Status: DC

## 2018-11-12 NOTE — Telephone Encounter (Signed)
Is this ok?

## 2018-11-12 NOTE — Telephone Encounter (Signed)
rx sent into [facility].

## 2018-11-12 NOTE — Progress Notes (Signed)
Subjective: Chief Complaint  Patient presents with  . follow up    wound follow up 10 day   Here for recheck on leg wound, abscess.  This started about 4 + weeks ago.   He had went to urgent care 3 weeks ago for the same.  He did not actually feel a bite or see a bite from an insect.  He denies any other injury.  He was put on Keflex and Bactroban ointment to urgent care.  He used all that without any improvement. Last visit we changed to silvadene cream and oral doxycyline.  He notes 50% improvement since visit on 11/03/2019.  He has some discomfort.  The wound is draining.  No fever.  No nausea.  No vomiting no body aches no chills. No other aggravating or relieving factors. No other complaint.   Objective: BP 130/84   Pulse 84   Temp 98.2 F (36.8 C) (Oral)   Resp 16   Ht 5\' 9"  (1.753 m)   Wt (!) 342 lb 6.4 oz (155.3 kg)   SpO2 96%   BMI 50.56 kg/m   General: Well-developed well-nourished no acute distress Left posterior leg inferior portion of calf with 5 cm round brown somewhat thickened tissue with a 3cm diameter central round area of erythema and serous pus drainage.  No fluctuance, no warmth, no redness surrounding the wound. He chronically has a asymmetrically larger left calf compared to the right. Pulses are 1+ lower extremities    Assessment: Encounter Diagnoses  Name Primary?  [name] Wound healing, delayed Yes  . Wound drainage   . Skin infection   . Diabetes mellitus type 2 in obese (HCC)   . Essential hypertension   . Decreased pulse     Plan: We discussed the findings, possible complications of prompt immediate recheck.  We discussed instructions below  Patient Instructions  Recommendations  We will refer you to wound clinic  We will likely schedule you for a blood pressure test in the legs to screen for blood flow  We will call with appointment info for wound clinic, but if this will take longer than a week to get in, then I want you to try Unna Boot  medication wrap over the counter  The [employer] is a medicated wrap that you layer on/wrap on, and leave on for 5 days.   Then after 5 days you can repeat this or hopefully you will get in to wound clinic by then  If you don't want to do [employer], as this can cost $30-60 per treatment, you can continue the Silvadene cream and go a little longer on the oral antibiotic sent today  Continue to work on eating a healthy low sugar diet, morning sugars hopefully less than 130.      [name] was seen today for follow up.  Diagnoses and all orders for this visit:  Wound healing, delayed  Wound drainage  Skin infection  Diabetes mellitus type 2 in obese [facility])  Essential hypertension  Decreased pulse  Other orders -     doxycycline (VIBRA-TABS) 100 MG tablet; Take 1 tablet (100 mg total) by mouth 2 (two) times daily.

## 2018-11-12 NOTE — Telephone Encounter (Signed)
Pharmacy sent refill for Verapamil HCL tabs 80 mg 90 day supply with 4 refills need sent to the [facility] - [place], [place] - [address]

## 2018-11-12 NOTE — Telephone Encounter (Signed)
I just refilled in 09/2018.  Not sure why we are getting this now. Please check with pharmacy or verify which pharmacy he is getting this from  I don't want him to get duplicate scripts

## 2018-11-12 NOTE — Patient Instructions (Addendum)
Recommendations  We will refer you to wound clinic  We will likely schedule you for a blood pressure test in the legs to screen for blood flow  We will call with appointment info for wound clinic, but if this will take longer than a week to get in, then I want you to try Unna Boot medication wrap over the counter  The [employer] is a medicated wrap that you layer on/wrap on, and leave on for 5 days.   Then after 5 days you can repeat this or hopefully you will get in to wound clinic by then  If you don't want to do [employer], as this can cost $30-60 per treatment, you can continue the Silvadene cream and go a little longer on the oral antibiotic sent today  Continue to work on eating a healthy low sugar diet, morning sugars hopefully less than 130.

## 2018-11-23 ENCOUNTER — Encounter (HOSPITAL_BASED_OUTPATIENT_CLINIC_OR_DEPARTMENT_OTHER): Payer: Managed Care, Other (non HMO) | Attending: Internal Medicine

## 2018-11-23 DIAGNOSIS — I10 Essential (primary) hypertension: Secondary | ICD-10-CM | POA: Insufficient documentation

## 2018-11-23 DIAGNOSIS — Z9989 Dependence on other enabling machines and devices: Secondary | ICD-10-CM | POA: Diagnosis not present

## 2018-11-23 DIAGNOSIS — G4733 Obstructive sleep apnea (adult) (pediatric): Secondary | ICD-10-CM | POA: Insufficient documentation

## 2018-11-23 DIAGNOSIS — Z7984 Long term (current) use of oral hypoglycemic drugs: Secondary | ICD-10-CM | POA: Insufficient documentation

## 2018-11-23 DIAGNOSIS — Z6841 Body Mass Index (BMI) 40.0 and over, adult: Secondary | ICD-10-CM | POA: Insufficient documentation

## 2018-11-23 DIAGNOSIS — L97222 Non-pressure chronic ulcer of left calf with fat layer exposed: Secondary | ICD-10-CM | POA: Insufficient documentation

## 2018-11-23 DIAGNOSIS — E11622 Type 2 diabetes mellitus with other skin ulcer: Secondary | ICD-10-CM | POA: Insufficient documentation

## 2018-11-30 DIAGNOSIS — E11622 Type 2 diabetes mellitus with other skin ulcer: Secondary | ICD-10-CM | POA: Diagnosis not present

## 2018-11-30 DIAGNOSIS — L97222 Non-pressure chronic ulcer of left calf with fat layer exposed: Secondary | ICD-10-CM | POA: Diagnosis not present

## 2018-12-03 DIAGNOSIS — Z79899 Other long term (current) drug therapy: Secondary | ICD-10-CM | POA: Diagnosis not present

## 2018-12-03 DIAGNOSIS — F902 Attention-deficit hyperactivity disorder, combined type: Secondary | ICD-10-CM | POA: Diagnosis not present

## 2018-12-07 ENCOUNTER — Encounter (HOSPITAL_BASED_OUTPATIENT_CLINIC_OR_DEPARTMENT_OTHER): Payer: BLUE CROSS/BLUE SHIELD | Attending: Internal Medicine

## 2018-12-07 DIAGNOSIS — L97222 Non-pressure chronic ulcer of left calf with fat layer exposed: Secondary | ICD-10-CM | POA: Diagnosis not present

## 2018-12-07 DIAGNOSIS — E11622 Type 2 diabetes mellitus with other skin ulcer: Secondary | ICD-10-CM | POA: Insufficient documentation

## 2018-12-07 DIAGNOSIS — I10 Essential (primary) hypertension: Secondary | ICD-10-CM | POA: Insufficient documentation

## 2018-12-07 DIAGNOSIS — G4730 Sleep apnea, unspecified: Secondary | ICD-10-CM | POA: Diagnosis not present

## 2018-12-14 DIAGNOSIS — S81802A Unspecified open wound, left lower leg, initial encounter: Secondary | ICD-10-CM | POA: Diagnosis not present

## 2018-12-14 DIAGNOSIS — G4730 Sleep apnea, unspecified: Secondary | ICD-10-CM | POA: Diagnosis not present

## 2018-12-14 DIAGNOSIS — I10 Essential (primary) hypertension: Secondary | ICD-10-CM | POA: Diagnosis not present

## 2018-12-14 DIAGNOSIS — E11622 Type 2 diabetes mellitus with other skin ulcer: Secondary | ICD-10-CM | POA: Diagnosis not present

## 2018-12-14 DIAGNOSIS — L97222 Non-pressure chronic ulcer of left calf with fat layer exposed: Secondary | ICD-10-CM | POA: Diagnosis not present

## 2018-12-21 DIAGNOSIS — G4730 Sleep apnea, unspecified: Secondary | ICD-10-CM | POA: Diagnosis not present

## 2018-12-21 DIAGNOSIS — L97222 Non-pressure chronic ulcer of left calf with fat layer exposed: Secondary | ICD-10-CM | POA: Diagnosis not present

## 2018-12-21 DIAGNOSIS — E11622 Type 2 diabetes mellitus with other skin ulcer: Secondary | ICD-10-CM | POA: Diagnosis not present

## 2018-12-21 DIAGNOSIS — I10 Essential (primary) hypertension: Secondary | ICD-10-CM | POA: Diagnosis not present

## 2018-12-21 DIAGNOSIS — S81802A Unspecified open wound, left lower leg, initial encounter: Secondary | ICD-10-CM | POA: Diagnosis not present

## 2018-12-28 ENCOUNTER — Other Ambulatory Visit: Payer: Self-pay | Admitting: Medical

## 2018-12-28 DIAGNOSIS — E11622 Type 2 diabetes mellitus with other skin ulcer: Secondary | ICD-10-CM | POA: Diagnosis not present

## 2018-12-28 DIAGNOSIS — G4730 Sleep apnea, unspecified: Secondary | ICD-10-CM | POA: Diagnosis not present

## 2018-12-28 DIAGNOSIS — I10 Essential (primary) hypertension: Secondary | ICD-10-CM | POA: Diagnosis not present

## 2018-12-28 DIAGNOSIS — L97222 Non-pressure chronic ulcer of left calf with fat layer exposed: Secondary | ICD-10-CM | POA: Diagnosis not present

## 2019-01-04 ENCOUNTER — Encounter (HOSPITAL_BASED_OUTPATIENT_CLINIC_OR_DEPARTMENT_OTHER): Payer: Self-pay

## 2019-01-04 ENCOUNTER — Encounter (HOSPITAL_BASED_OUTPATIENT_CLINIC_OR_DEPARTMENT_OTHER): Payer: BLUE CROSS/BLUE SHIELD | Attending: Internal Medicine

## 2019-01-04 DIAGNOSIS — I10 Essential (primary) hypertension: Secondary | ICD-10-CM | POA: Diagnosis not present

## 2019-01-04 DIAGNOSIS — G4730 Sleep apnea, unspecified: Secondary | ICD-10-CM | POA: Diagnosis not present

## 2019-01-04 DIAGNOSIS — L97222 Non-pressure chronic ulcer of left calf with fat layer exposed: Secondary | ICD-10-CM | POA: Insufficient documentation

## 2019-01-04 DIAGNOSIS — E11622 Type 2 diabetes mellitus with other skin ulcer: Secondary | ICD-10-CM | POA: Diagnosis not present

## 2019-01-11 ENCOUNTER — Encounter (HOSPITAL_BASED_OUTPATIENT_CLINIC_OR_DEPARTMENT_OTHER): Payer: BLUE CROSS/BLUE SHIELD | Attending: Internal Medicine

## 2019-01-11 DIAGNOSIS — S81802A Unspecified open wound, left lower leg, initial encounter: Secondary | ICD-10-CM | POA: Diagnosis not present

## 2019-01-11 DIAGNOSIS — G4730 Sleep apnea, unspecified: Secondary | ICD-10-CM | POA: Diagnosis not present

## 2019-01-11 DIAGNOSIS — L97222 Non-pressure chronic ulcer of left calf with fat layer exposed: Secondary | ICD-10-CM | POA: Diagnosis not present

## 2019-01-11 DIAGNOSIS — E11622 Type 2 diabetes mellitus with other skin ulcer: Secondary | ICD-10-CM | POA: Diagnosis not present

## 2019-01-11 DIAGNOSIS — I872 Venous insufficiency (chronic) (peripheral): Secondary | ICD-10-CM | POA: Diagnosis not present

## 2019-01-11 DIAGNOSIS — I10 Essential (primary) hypertension: Secondary | ICD-10-CM | POA: Diagnosis not present

## 2019-02-13 ENCOUNTER — Other Ambulatory Visit: Payer: Self-pay | Admitting: Medical

## 2019-05-30 ENCOUNTER — Other Ambulatory Visit: Payer: Self-pay | Admitting: Medical

## 2019-05-30 NOTE — Telephone Encounter (Signed)
Is this ok to refill?  

## 2019-06-03 DIAGNOSIS — G4733 Obstructive sleep apnea (adult) (pediatric): Secondary | ICD-10-CM | POA: Diagnosis not present

## 2019-06-03 DIAGNOSIS — F902 Attention-deficit hyperactivity disorder, combined type: Secondary | ICD-10-CM | POA: Diagnosis not present

## 2019-07-29 ENCOUNTER — Other Ambulatory Visit: Payer: Self-pay

## 2019-07-29 ENCOUNTER — Encounter: Payer: Self-pay | Admitting: Medical

## 2019-07-29 ENCOUNTER — Ambulatory Visit (INDEPENDENT_AMBULATORY_CARE_PROVIDER_SITE_OTHER): Payer: BC Managed Care – PPO | Admitting: Medical

## 2019-07-29 VITALS — BP 118/84 | HR 96 | Temp 98.0°F | Ht 69.0 in | Wt 318.4 lb

## 2019-07-29 DIAGNOSIS — I10 Essential (primary) hypertension: Secondary | ICD-10-CM | POA: Diagnosis not present

## 2019-07-29 DIAGNOSIS — R899 Unspecified abnormal finding in specimens from other organs, systems and tissues: Secondary | ICD-10-CM | POA: Diagnosis not present

## 2019-07-29 DIAGNOSIS — Z23 Encounter for immunization: Secondary | ICD-10-CM | POA: Diagnosis not present

## 2019-07-29 DIAGNOSIS — K760 Fatty (change of) liver, not elsewhere classified: Secondary | ICD-10-CM | POA: Diagnosis not present

## 2019-07-29 DIAGNOSIS — E1169 Type 2 diabetes mellitus with other specified complication: Secondary | ICD-10-CM

## 2019-07-29 DIAGNOSIS — N401 Enlarged prostate with lower urinary tract symptoms: Secondary | ICD-10-CM

## 2019-07-29 DIAGNOSIS — Z9049 Acquired absence of other specified parts of digestive tract: Secondary | ICD-10-CM

## 2019-07-29 DIAGNOSIS — R1030 Lower abdominal pain, unspecified: Secondary | ICD-10-CM

## 2019-07-29 DIAGNOSIS — E669 Obesity, unspecified: Secondary | ICD-10-CM

## 2019-07-29 DIAGNOSIS — E785 Hyperlipidemia, unspecified: Secondary | ICD-10-CM

## 2019-07-29 LAB — POCT URINALYSIS DIP (PROADVANTAGE DEVICE)
Bilirubin, UA: NEGATIVE
Glucose, UA: >=1000 mg/dL — AB
Ketones, POC UA: NEGATIVE mg/dL
Leukocytes, UA: NEGATIVE
Nitrite, UA: NEGATIVE
Protein Ur, POC: 30 mg/dL — AB
Specific Gravity, Urine: 1.015
Urobilinogen, Ur: NEGATIVE
pH, UA: 6.5 (ref 5.0–8.0)

## 2019-07-29 NOTE — Progress Notes (Signed)
Subjective:  [name] is a 43 y.o. male who presents for Chief Complaint  Patient presents with  . Abdominal Pain    lower right side      Here for lower right abdominal pain. Still gets lower right abdominal pain intermittent.   Not related to time of meals.  Has been doing some home repair work.   Getting the pain daily, but intermittent.  If he moves a certain way it aches.  No fever.  No nausea, no vomiting, no diarrhea, no constipation, no urinary pain.  He does get sensation of incomplete voiding with urine and in mornings has urinary frequency.  Urine stream not weaker but does get some urinary dribbling.   No blood in urine or stool.  No testicle pain or swelling.  Drinks some alcohol, 1-2 times per week a drink.   No concern for STD.  Married, monogamous.   No other aggravating or relieving factors.    He is compliant with medications except doesn't recall using the benicar started 09/2018.[name]    No other c/o.  The following portions of the patient's history were reviewed and updated as appropriate: allergies, current medications, past family history, past medical history, past social history, past surgical history and problem list.  ROS Otherwise as in subjective above  Past Medical History:  Diagnosis Date  . Hypertension   . Migraine   . Nasal congestion 10/03/2014  . Non-insulin dependent type 2 diabetes mellitus ([place])   . Obesity   . Sleep apnea    uses CPAP nightly  . Wears contact lenses    Current Outpatient Medications on File Prior to Visit  Medication Sig Dispense Refill  . FARXIGA 5 MG TABS tablet TAKE 1 TABLET DAILY 90 tablet 0  . potassium chloride SA (K-DUR,KLOR-CON) 20 MEQ tablet Take 1 tablet (20 mEq total) by mouth 2 (two) times daily. 180 tablet 3  . rosuvastatin (CRESTOR) 20 MG tablet TAKE 1 TABLET AT BEDTIME 90 tablet 0  . silver sulfADIAZINE (SILVADENE) 1 % cream Apply 1 application topically daily. 50 g 1  . SUMAtriptan (IMITREX) 6 MG/0.5ML SOLN  injection Take one dose at headache onset, can take additional dose 2hrs later if needed. No more then 2 injections in 24hrs 8 vial 1  . tamsulosin (FLOMAX) 0.4 MG CAPS capsule TAKE 1 CAPSULE DAILY AFTER SUPPER 90 capsule 3  . verapamil (CALAN) 80 MG tablet Take 1 tablet (80 mg total) by mouth 3 (three) times daily. 270 tablet 4  . ADZENYS XR-ODT 15.7 MG TBED     . benazepril (LOTENSIN) 10 MG tablet TAKE 1 TABLET DAILY (Patient not taking: Reported on 07/29/2019) 90 tablet 0  . doxycycline (VIBRA-TABS) 100 MG tablet Take 1 tablet (100 mg total) by mouth 2 (two) times daily. (Patient not taking: Reported on 07/29/2019) 20 tablet 0   No current facility-administered medications on file prior to visit.    Past Surgical History:  Procedure Laterality Date  . CHOLECYSTECTOMY N/A 10/09/2014   Procedure: LAPAROSCOPIC CHOLECYSTECTOMY;  Surgeon: Coralie Keens, MD;  Location: [facility];  Service: General;  Laterality: N/A;     Objective: BP 118/84   Pulse 96   Temp 98 F (36.7 C)   Ht 5\' 9"  (1.753 m)   Wt (!) 318 lb 6.4 oz (144.4 kg)   SpO2 96%   BMI 47.02 kg/m   Wt Readings from Last 3 Encounters:  07/29/19 (!) 318 lb 6.4 oz (144.4 kg)  11/12/18 [name])  342 lb 6.4 oz (155.3 kg)  11/02/18 (!) 337 lb 9.6 oz (153.1 kg)    General appearance: alert, no distress, well developed, well nourished Abdomen: +bs, soft, mild right lower abdomen tenderness, otherwise non tender, non distended, no masses, no hepatomegaly, no splenomegaly Gu: normal male, nontender, no obvious hernia, no lymphadenopathy Pulses: 2+ radial pulses, 2+ pedal pulses, normal cap refill Ext: no edema   Assessment: Encounter Diagnoses  Name Primary?  . Lower abdominal pain Yes  . Benign prostatic hyperplasia with lower urinary tract symptoms, symptom details unspecified   . Fatty liver   . Essential hypertension   . Diabetes mellitus type 2 in obese (HCC)   . Hyperlipidemia associated with type 2 diabetes  mellitus (HCC)   . S/P chole   . Need for influenza vaccination      Plan: We discussed his symptoms, differential.  If not completely clear at this is a prostate issue versus muscle strain.  He has been lifting heavy pieces of sheet rock and ceramic tile doing home remodeling, and no obvious hernia today.  Urinalysis showed microscopic hematuria but urine micro shows no red cells.  Labs today.  Pending labs possibly may order abdominal imaging  Counseled on the influenza virus vaccine.  Vaccine information sheet given.  Influenza vaccine given after consent obtained.   Travez was seen today for abdominal pain.  Diagnoses and all orders for this visit:  Lower abdominal pain -     Comprehensive metabolic panel -     PSA -     POCT Urinalysis DIP (Proadvantage Device)  Benign prostatic hyperplasia with lower urinary tract symptoms, symptom details unspecified -     PSA  Fatty liver  Essential hypertension  Diabetes mellitus type 2 in obese (HCC)  Hyperlipidemia associated with type 2 diabetes mellitus (HCC)  S/P chole  Need for influenza vaccination  Other orders -     Flu Vaccine QUAD 6+ mos PF IM (Fluarix Quad PF)    Follow up: pending labs

## 2019-07-30 ENCOUNTER — Other Ambulatory Visit: Payer: Self-pay | Admitting: Medical

## 2019-07-30 LAB — SPECIMEN STATUS

## 2019-07-30 MED ORDER — DOXYCYCLINE HYCLATE 100 MG PO TABS: 100.0000 mg | ORAL_TABLET | Freq: Two times a day (BID) | ORAL | 0 refills | Status: DC

## 2019-08-01 LAB — COMPREHENSIVE METABOLIC PANEL
ALT: 30 IU/L (ref 0–44)
AST: 26 IU/L (ref 0–40)
Albumin/Globulin Ratio: 1.8 (ref 1.2–2.2)
Albumin: 4.5 g/dL (ref 4.0–5.0)
Alkaline Phosphatase: 76 IU/L (ref 39–117)
BUN/Creatinine Ratio: 15 (ref 9–20)
BUN: 17 mg/dL (ref 6–24)
Bilirubin Total: 1.3 mg/dL — ABNORMAL HIGH (ref 0.0–1.2)
CO2: 28 mmol/L (ref 20–29)
Calcium: 9 mg/dL (ref 8.7–10.2)
Chloride: 97 mmol/L (ref 96–106)
Creatinine, Ser: 1.1 mg/dL (ref 0.76–1.27)
GFR calc Af Amer: 96 mL/min/{1.73_m2} (ref >59)
GFR calc non Af Amer: 83 mL/min/{1.73_m2} (ref >59)
Globulin, Total: 2.5 g/dL (ref 1.5–4.5)
Glucose: 104 mg/dL — ABNORMAL HIGH (ref 65–99)
Potassium: 2.7 mmol/L — ABNORMAL LOW (ref 3.5–5.2)
Sodium: 141 mmol/L (ref 134–144)
Total Protein: 7 g/dL (ref 6.0–8.5)

## 2019-08-01 LAB — PSA: Prostate Specific Ag, Serum: 1.5 ng/mL (ref 0.0–4.0)

## 2019-08-02 ENCOUNTER — Other Ambulatory Visit: Payer: Self-pay | Admitting: Medical

## 2019-08-02 MED ORDER — BENAZEPRIL HCL 10 MG PO TABS: 10.0000 mg | ORAL_TABLET | Freq: Every day | ORAL | 3 refills | Status: DC

## 2019-08-02 MED ORDER — POTASSIUM CHLORIDE CRYS ER 20 MEQ PO TBCR: EXTENDED_RELEASE_TABLET | ORAL | 2 refills | Status: DC

## 2019-08-02 MED ORDER — POTASSIUM CHLORIDE CRYS ER 10 MEQ PO TBCR: 10.0000 meq | EXTENDED_RELEASE_TABLET | Freq: Every day | ORAL | 2 refills | Status: DC

## 2019-08-02 MED ORDER — POTASSIUM CHLORIDE CRYS ER 20 MEQ PO TBCR: EXTENDED_RELEASE_TABLET | ORAL | 3 refills | Status: DC

## 2019-08-04 LAB — SPECIMEN STATUS REPORT

## 2019-08-04 LAB — FOLATE: Folate: 9.7 ng/mL (ref >3.0)

## 2019-08-04 LAB — ALDOSTERONE: ALDOSTERONE: 25.3 ng/dL (ref 0.0–30.0)

## 2019-08-11 ENCOUNTER — Other Ambulatory Visit: Payer: Self-pay | Admitting: Medical

## 2019-08-11 DIAGNOSIS — E876 Hypokalemia: Secondary | ICD-10-CM

## 2019-08-11 DIAGNOSIS — R7989 Other specified abnormal findings of blood chemistry: Secondary | ICD-10-CM

## 2019-08-11 DIAGNOSIS — E1169 Type 2 diabetes mellitus with other specified complication: Secondary | ICD-10-CM

## 2019-08-11 DIAGNOSIS — E669 Obesity, unspecified: Secondary | ICD-10-CM

## 2019-08-11 DIAGNOSIS — I10 Essential (primary) hypertension: Secondary | ICD-10-CM

## 2019-09-09 DIAGNOSIS — Z79899 Other long term (current) drug therapy: Secondary | ICD-10-CM | POA: Diagnosis not present

## 2019-09-09 DIAGNOSIS — F902 Attention-deficit hyperactivity disorder, combined type: Secondary | ICD-10-CM | POA: Diagnosis not present

## 2019-09-09 DIAGNOSIS — G4733 Obstructive sleep apnea (adult) (pediatric): Secondary | ICD-10-CM | POA: Diagnosis not present

## 2019-09-12 ENCOUNTER — Other Ambulatory Visit: Payer: Self-pay | Admitting: Podiatry

## 2019-09-12 ENCOUNTER — Other Ambulatory Visit: Payer: Self-pay | Admitting: Medical

## 2019-09-14 ENCOUNTER — Other Ambulatory Visit: Payer: Self-pay | Admitting: Medical

## 2019-09-14 MED ORDER — DOXYCYCLINE HYCLATE 100 MG PO TABS: 100.0000 mg | ORAL_TABLET | Freq: Two times a day (BID) | ORAL | 0 refills | Status: DC

## 2019-10-06 ENCOUNTER — Encounter (HOSPITAL_BASED_OUTPATIENT_CLINIC_OR_DEPARTMENT_OTHER): Payer: BC Managed Care – PPO | Attending: Internal Medicine | Admitting: Internal Medicine

## 2019-10-06 ENCOUNTER — Other Ambulatory Visit: Payer: Self-pay

## 2019-10-06 DIAGNOSIS — Z09 Encounter for follow-up examination after completed treatment for conditions other than malignant neoplasm: Secondary | ICD-10-CM | POA: Diagnosis not present

## 2019-10-06 DIAGNOSIS — Z872 Personal history of diseases of the skin and subcutaneous tissue: Secondary | ICD-10-CM | POA: Insufficient documentation

## 2019-10-06 DIAGNOSIS — Z7984 Long term (current) use of oral hypoglycemic drugs: Secondary | ICD-10-CM | POA: Diagnosis not present

## 2019-10-06 DIAGNOSIS — G4730 Sleep apnea, unspecified: Secondary | ICD-10-CM | POA: Diagnosis not present

## 2019-10-06 DIAGNOSIS — L853 Xerosis cutis: Secondary | ICD-10-CM | POA: Diagnosis not present

## 2019-10-06 DIAGNOSIS — I10 Essential (primary) hypertension: Secondary | ICD-10-CM | POA: Insufficient documentation

## 2019-10-07 ENCOUNTER — Ambulatory Visit (INDEPENDENT_AMBULATORY_CARE_PROVIDER_SITE_OTHER): Payer: BC Managed Care – PPO | Admitting: Medical

## 2019-10-07 ENCOUNTER — Encounter: Payer: Self-pay | Admitting: Medical

## 2019-10-07 VITALS — BP 148/88 | HR 96 | Temp 96.3°F | Ht 69.0 in | Wt 318.6 lb

## 2019-10-07 DIAGNOSIS — Z9049 Acquired absence of other specified parts of digestive tract: Secondary | ICD-10-CM

## 2019-10-07 DIAGNOSIS — G4733 Obstructive sleep apnea (adult) (pediatric): Secondary | ICD-10-CM

## 2019-10-07 DIAGNOSIS — R7989 Other specified abnormal findings of blood chemistry: Secondary | ICD-10-CM | POA: Diagnosis not present

## 2019-10-07 DIAGNOSIS — E291 Testicular hypofunction: Secondary | ICD-10-CM

## 2019-10-07 DIAGNOSIS — R4584 Anhedonia: Secondary | ICD-10-CM | POA: Insufficient documentation

## 2019-10-07 DIAGNOSIS — Z0000 Encounter for general adult medical examination without abnormal findings: Secondary | ICD-10-CM | POA: Diagnosis not present

## 2019-10-07 DIAGNOSIS — E669 Obesity, unspecified: Secondary | ICD-10-CM | POA: Diagnosis not present

## 2019-10-07 DIAGNOSIS — K760 Fatty (change of) liver, not elsewhere classified: Secondary | ICD-10-CM | POA: Diagnosis not present

## 2019-10-07 DIAGNOSIS — E876 Hypokalemia: Secondary | ICD-10-CM

## 2019-10-07 DIAGNOSIS — E785 Hyperlipidemia, unspecified: Secondary | ICD-10-CM

## 2019-10-07 DIAGNOSIS — I10 Essential (primary) hypertension: Secondary | ICD-10-CM | POA: Diagnosis not present

## 2019-10-07 DIAGNOSIS — E1169 Type 2 diabetes mellitus with other specified complication: Secondary | ICD-10-CM | POA: Diagnosis not present

## 2019-10-07 DIAGNOSIS — R4586 Emotional lability: Secondary | ICD-10-CM

## 2019-10-07 DIAGNOSIS — L301 Dyshidrosis [pompholyx]: Secondary | ICD-10-CM | POA: Insufficient documentation

## 2019-10-07 DIAGNOSIS — Z9989 Dependence on other enabling machines and devices: Secondary | ICD-10-CM

## 2019-10-07 DIAGNOSIS — R5383 Other fatigue: Secondary | ICD-10-CM

## 2019-10-07 MED ORDER — HYDROCORTISONE VALERATE 0.2 % EX OINT: 1.0000 "application " | TOPICAL_OINTMENT | Freq: Two times a day (BID) | CUTANEOUS | 0 refills | Status: DC

## 2019-10-07 NOTE — Progress Notes (Signed)
Subjective:   HPI  [name] is a 43 y.o. male who presents for Chief Complaint  Patient presents with  . Annual Exam    with fasting labs     Patient Care Team: Sachiko Methot, [name], PA-C as PCP - General (Family Medicine) Irineo Gaulin, [name], PA-C [facility]) Sees dentist Sees eye doctor  Concerns: Exercise - not much of late.   Work - working from home.     Been using Nugenex total T testosteone OTC.  Despite prior prescriptions for injectable TST in 2017 and 2018, he says pharmacy never received these.  He notes his wife wanted him talk to [place] about his flat mood.  He doesn't seem to get excited, upset, happy or other, kind of flat and blah about everything, even at times when he should be upset.   He has not seen a counseling.  No SI, no HI.   He doesn't have hobbies currently and notes not being particularly joyous about anything.   Just does same routine with work and day to day usual activities.   Has f/u with nephology new consult for low potassium in 2 weeks.  Not currently taking potasium supplement.   He has new rash on hands and fingertips, skin peeling in recent weeks.  Uses hand gel, does a lot of hand washing, has tried lotion.    No prior similar.    Reviewed their medical, surgical, family, social, medication, and allergy history and updated chart as appropriate.  Past Medical History:  Diagnosis Date  . Hypertension   . Migraine   . Nasal congestion 10/03/2014  . Non-insulin dependent type 2 diabetes mellitus (HCC)   . Obesity   . Sleep apnea    uses CPAP nightly  . Wears contact lenses     Past Surgical History:  Procedure Laterality Date  . CHOLECYSTECTOMY N/A 10/09/2014   Procedure: LAPAROSCOPIC CHOLECYSTECTOMY;  Surgeon: [name], MD;  Location: [facility];  Service: General;  Laterality: N/A;    Social History   Socioeconomic History  . Marital status: Married    Spouse name: Not on file  . Number of children: 1   . Years of education: Not on file  . Highest education level: Some college, no degree  Occupational History  . Not on file  Social Needs  . Financial resource strain: Not on file  . Food insecurity    Worry: Not on file    Inability: Not on file  . Transportation needs    Medical: Not on file    Non-medical: Not on file  Tobacco Use  . Smoking status: Never Smoker  . Smokeless tobacco: Never Used  Substance and Sexual Activity  . Alcohol use: Yes    Comment: occasional  . Drug use: No  . Sexual activity: Yes  Lifestyle  . Physical activity    Days per week: Not on file    Minutes per session: Not on file  . Stress: Not on file  Relationships  . Social Musician on phone: Not on file    Gets together: Not on file    Attends religious service: Not on file    Active member of club or organization: Not on file    Attends meetings of clubs or organizations: Not on file    Relationship status: Not on file  . Intimate partner violence    Fear of current or ex partner: Not on file    Emotionally  abused: Not on file    Physically abused: Not on file    Forced sexual activity: Not on file  Other Topics Concern  . Not on file  Social History Narrative   Lives at home with wife & 3 children and a dog.    Married, has 9yo boy.  Wife has 2 children, so 3 in the household.  Exercise some with walking.  Works as Financial trader for [employer].     Drinks 1-2 cups of caffeine daily   Right handed   09/2018        Family History  Problem Relation Age of Onset  . Hypertension Mother   . Kidney disease Father   . Diabetes Maternal Aunt   . Diabetes Paternal Aunt   . Cancer Maternal Grandfather        throat  . Stroke Paternal Grandmother   . Alcohol abuse Paternal Grandmother   . Heart disease Neg Hx      Current Outpatient Medications:  .  benazepril (LOTENSIN) 10 MG tablet, Take 1 tablet (10 mg total) by mouth daily., Disp: 90 tablet, Rfl: 3 .  FARXIGA 5  MG TABS tablet, TAKE 1 TABLET DAILY, Disp: 90 tablet, Rfl: 0 .  lisdexamfetamine (VYVANSE) 30 MG capsule, Take 30 mg by mouth daily., Disp: , Rfl:  .  rosuvastatin (CRESTOR) 20 MG tablet, TAKE 1 TABLET AT BEDTIME, Disp: 90 tablet, Rfl: 0 .  SUMAtriptan (IMITREX) 6 MG/0.5ML SOLN injection, Take one dose at headache onset, can take additional dose 2hrs later if needed. No more then 2 injections in 24hrs, Disp: 8 vial, Rfl: 1 .  tamsulosin (FLOMAX) 0.4 MG CAPS capsule, TAKE 1 CAPSULE DAILY AFTER SUPPER, Disp: 90 capsule, Rfl: 3 .  verapamil (CALAN) 80 MG tablet, Take 1 tablet (80 mg total) by mouth 3 (three) times daily., Disp: 270 tablet, Rfl: 4 .  ADZENYS XR-ODT 15.7 MG TBED, , Disp: , Rfl:  .  doxycycline (VIBRA-TABS) 100 MG tablet, Take 1 tablet (100 mg total) by mouth 2 (two) times daily. (Patient not taking: Reported on 10/07/2019), Disp: 20 tablet, Rfl: 0 .  hydrocortisone valerate ointment (WESTCORT) 0.2 %, Apply 1 application topically 2 (two) times daily., Disp: 45 g, Rfl: 0 .  potassium chloride (K-DUR) 10 MEQ tablet, Take 1 tablet (10 mEq total) by mouth daily. (Patient not taking: Reported on 10/07/2019), Disp: 30 tablet, Rfl: 2 .  potassium chloride SA (K-DUR) 20 MEQ tablet, 1 tablet po BID (Patient not taking: Reported on 10/07/2019), Disp: 60 tablet, Rfl: 2 .  silver sulfADIAZINE (SILVADENE) 1 % cream, Apply 1 application topically daily. (Patient not taking: Reported on 10/07/2019), Disp: 50 g, Rfl: 1  No Known Allergies     Review of Systems Constitutional: -fever, -chills, -sweats, -unexpected weight change, -decreased appetite, +chronic fatigue Allergy: -sneezing, -itching, -congestion Dermatology: -changing moles, +rash, -lumps ENT: -runny nose, -ear pain, -sore throat, -hoarseness, -sinus pain, -teeth pain, - ringing in ears, -hearing loss, -nosebleeds Cardiology: -chest pain, -palpitations, -swelling, -difficulty breathing when lying flat, -waking up short of  breath Respiratory: -cough, -shortness of breath, -difficulty breathing with exercise or exertion, -wheezing, -coughing up blood Gastroenterology: -abdominal pain, -nausea, -vomiting, -diarrhea, -constipation, -blood in stool, -changes in bowel movement, -difficulty swallowing or eating Hematology: -bleeding, -bruising  Musculoskeletal: -joint aches, -muscle aches, -joint swelling, -back pain, -neck pain, -cramping, -changes in gait Ophthalmology: denies vision changes, eye redness, itching, discharge Urology: -burning with urination, -difficulty urinating, -blood in urine, -urinary frequency, -urgency, -incontinence  Neurology: -headache, -weakness, -tingling, -numbness, -memory loss, -falls, -dizziness Psychology: -agitation, -sleep problems Male GU: no testicular mass, pain, no lymph nodes swollen, no swelling, no rash.     Objective:  BP (!) 148/88   Pulse 96   Temp (!) 96.3 F (35.7 C)   Ht 5\' 9"  (1.753 m)   Wt (!) 318 lb 9.6 oz (144.5 kg)   SpO2 97%   BMI 47.05 kg/m   General appearance: alert, no distress, WD/WN, African American male Skin: skin peeling superficially of several distal phalanges of both hands, some rough cracking skin patches along thumb creases bilat, otherwise no worrisome skin lesions HEENT: normocephalic, conjunctiva/corneas normal, sclerae anicteric, PERRLA, EOMi, nares patent, no discharge or erythema, pharynx normal Oral cavity: MMM, tongue normal, teeth normal Neck: supple, no lymphadenopathy, no thyromegaly, no masses, normal ROM, no bruits Chest: non tender, normal shape and expansion Heart: RRR, normal S1, S2, no murmurs Lungs: CTA bilaterally, no wheezes, rhonchi, or rales Abdomen: +bs, soft, non tender, non distended, no masses, no hepatomegaly, no splenomegaly, no bruits Back: non tender, normal ROM, no scoliosis Musculoskeletal: upper extremities non tender, no obvious deformity, normal ROM throughout, lower extremities non tender, no obvious  deformity, normal ROM throughout Extremities: no edema, no cyanosis, no clubbing Pulses: 2+ symmetric, upper and lower extremities, normal cap refill Neurological: alert, oriented x 3, CN2-12 intact, strength normal upper extremities and lower extremities, sensation normal throughout, DTRs 2+ throughout, no cerebellar signs, gait normal Psychiatric: flat affect, pleasant  GU: normal male external genitalia, nontender, no masses, no hernia, no lymphadenopathy Rectal: deferred   Assessment and Plan :   Encounter Diagnoses  Name Primary?  . Encounter for health maintenance examination in adult Yes  . Essential hypertension   . OSA on CPAP   . Fatty liver   . Diabetes mellitus type 2 in obese (HCC)   . Hypogonadism in male   . Hyperlipidemia associated with type 2 diabetes mellitus (HCC)   . Morbid obesity (HCC)   . S/P chole   . Low testosterone   . Fatigue, unspecified type   . Hypokalemia   . Anhedonia   . Mood change   . Dyshidrotic hand dermatitis     Physical exam - discussed and counseled on healthy lifestyle, diet, exercise, preventative care, vaccinations, sick and well care, proper use of emergency dept and after hours care, and addressed their concerns.    Health screening: See your eye doctor yearly for routine vision care. See your dentist yearly for routine dental care including hygiene visits twice yearly.  Cancer screening Advised monthly self testicular exam  Colonoscopy:  Age 47yo  Discussed PSA, prostate exam, and prostate cancer screening risks/benefits.   reviewed recent normal PSA 07/2019   Vaccinations: Up to date on tetanus, pneumococcal 23 and flu vaccines   Acute issues discussed: Flat mood, anhedonia - advised counseling, discussed finding purpose in joy  In life, being present and engaged with kids, wife.  Consider medication.  He will talk to wife about this.  Spend a large amount of today's visit on this issue.     Separate significant  chronic issues discussed: Diabetes - labs today, c/t same medication.  He is current not checking sugars. Counseled on glucometer testing  HTN - c/t same medication  Low testosterone, hypogonadism - apparently there was miscommunication in 2017 and 2018 as he says pharmacy never got the medication we sent.  Thus, he never started therapy.  Recheck level today.  Fatigue -  multifactorial, due to low T, mood, chronic medical issues,  Other  dishydrotic eczema of hands - avoid alcohol based hand gels and over washing or harsh chemicals.   Begin Westcort ointment 7-10 days, then daily moisturizing lotion  Fatty liver - advised need to lose weight through healthy diet, exercise which he is not currently doing  Hypokalemia - reviewed recent labs. F/u with nephrology in the next 2 weeks for consult as planned    Ramon Dredgedward was seen today for annual exam.  Diagnoses and all orders for this visit:  Encounter for health maintenance examination in adult -     Cortisol -     Hemoglobin A1c -     Microalbumin/Creatinine Ratio, Urine -     Lipid Panel -     CBC with Differential -     Testosterone  Essential hypertension -     Microalbumin/Creatinine Ratio, Urine  OSA on CPAP  Fatty liver  Diabetes mellitus type 2 in obese (HCC) -     Hemoglobin A1c -     Microalbumin/Creatinine Ratio, Urine  Hypogonadism in male -     Testosterone  Hyperlipidemia associated with type 2 diabetes mellitus (HCC) -     Lipid Panel  Morbid obesity (HCC)  S/P chole  Low testosterone -     Testosterone  Fatigue, unspecified type -     CBC with Differential -     Testosterone  Hypokalemia  Anhedonia  Mood change  Dyshidrotic hand dermatitis  Other orders -     hydrocortisone valerate ointment (WESTCORT) 0.2 %; Apply 1 application topically 2 (two) times daily.   Spent > 60 minutes face to face with patient in discussion of symptoms, evaluation, plan and recommendations.    Follow-up  pending labs, yearly for physical

## 2019-10-08 LAB — LIPID PANEL
Chol/HDL Ratio: 2.9 ratio (ref 0.0–5.0)
Cholesterol, Total: 105 mg/dL (ref 100–199)
HDL: 36 mg/dL — ABNORMAL LOW (ref >39)
LDL Chol Calc (NIH): 48 mg/dL (ref 0–99)
Triglycerides: 111 mg/dL (ref 0–149)
VLDL Cholesterol Cal: 21 mg/dL (ref 5–40)

## 2019-10-08 LAB — CBC WITH DIFFERENTIAL/PLATELET
Basophils Absolute: 0.1 10*3/uL (ref 0.0–0.2)
Basos: 1 %
EOS (ABSOLUTE): 0 10*3/uL (ref 0.0–0.4)
Eos: 1 %
Hematocrit: 42.3 % (ref 37.5–51.0)
Hemoglobin: 14.3 g/dL (ref 13.0–17.7)
Immature Grans (Abs): 0.1 10*3/uL (ref 0.0–0.1)
Immature Granulocytes: 1 %
Lymphocytes Absolute: 1.5 10*3/uL (ref 0.7–3.1)
Lymphs: 34 %
MCH: 28.4 pg (ref 26.6–33.0)
MCHC: 33.8 g/dL (ref 31.5–35.7)
MCV: 84 fL (ref 79–97)
Monocytes Absolute: 0.3 10*3/uL (ref 0.1–0.9)
Monocytes: 8 %
Neutrophils Absolute: 2.3 10*3/uL (ref 1.4–7.0)
Neutrophils: 55 %
Platelets: 129 10*3/uL — ABNORMAL LOW (ref 150–450)
RBC: 5.04 x10E6/uL (ref 4.14–5.80)
RDW: 14.6 % (ref 11.6–15.4)
WBC: 4.2 10*3/uL (ref 3.4–10.8)

## 2019-10-08 LAB — MICROALBUMIN / CREATININE URINE RATIO
Creatinine, Urine: 106.7 mg/dL
Microalb/Creat Ratio: 77 mg/g creat — ABNORMAL HIGH (ref 0–29)
Microalbumin, Urine: 82.1 ug/mL

## 2019-10-08 LAB — HEMOGLOBIN A1C
Est. average glucose Bld gHb Est-mCnc: 120 mg/dL
Hgb A1c MFr Bld: 5.8 % — ABNORMAL HIGH (ref 4.8–5.6)

## 2019-10-08 LAB — TESTOSTERONE: Testosterone: 302 ng/dL (ref 264–916)

## 2019-10-08 LAB — CORTISOL: Cortisol: 4.8 ug/dL

## 2019-10-10 ENCOUNTER — Other Ambulatory Visit: Payer: Self-pay | Admitting: Medical

## 2019-10-10 MED ORDER — HYDROCORTISONE 0.5 % EX OINT: 1.0000 "application " | TOPICAL_OINTMENT | Freq: Two times a day (BID) | CUTANEOUS | 0 refills | Status: DC

## 2019-10-10 MED ORDER — TESTOSTERONE CYPIONATE 200 MG/ML IM SOLN: 200.0000 mg | INTRAMUSCULAR | 5 refills | Status: DC

## 2019-10-10 NOTE — Progress Notes (Signed)
HARRELL, NIEHOFF ([id number]) Visit Report for 10/06/2019 Abuse/Suicide Risk Screen Details Patient Name: Date of Service: [name], [name] 10/06/2019 1:15 PM Medical Record [id number] Patient Account Number: [id number] Date of Birth/Sex: Treating RN: [birth year] (43 y.o. Janyth Contes Primary Care Blasa Raisch: Chana Bode Other Clinician: Referring Nairi Oswald: Treating Sheyann Sulton/Extender:Robson, Hurshel Keys, Bobbye Riggs in Treatment: 0 Abuse/Suicide Risk Screen Items Answer ABUSE RISK SCREEN: Has anyone close to you tried to hurt or harm you recentlyo No Do you feel uncomfortable with anyone in your familyo No Has anyone forced you do things that you didnt want to doo No Electronic Signature(s) Signed: 10/10/2019 5:51:44 PM By: Levan Hurst RN, BSN Entered By: Levan Hurst on 10/06/2019 14:29:45 -------------------------------------------------------------------------------- Activities of Daily Living Details Patient Name: Date of Service: [name] 10/06/2019 1:15 PM Medical Record [id number] Patient Account Number: [id number] Date of Birth/Sex: Treating RN: [birth year] (43 y.o. Janyth Contes Primary Care Gimena Buick: Chana Bode Other Clinician: Referring Jeren Dufrane: Treating Kriss Perleberg/Extender:Robson, Hurshel Keys, Bobbye Riggs in Treatment: 0 Activities of Daily Living Items Answer Activities of Daily Living (Please select one for each item) Drive Automobile Completely Able Take Medications Completely Able Use Telephone Completely Able Care for Appearance Completely Able Use Toilet Completely Able Bath / Shower Completely Able Dress Self Completely Able Feed Self Completely Able Walk Completely Able Get In / Out Bed Completely Able Housework Completely Able Prepare Meals Completely Hannah Completely Able Shop for Self Completely Able Electronic Signature(s) Signed: 10/10/2019 5:51:44 PM By: Levan Hurst RN, BSN Entered By:  Levan Hurst on 10/06/2019 14:30:03 -------------------------------------------------------------------------------- Education Screening Details Patient Name: Date of Service: [name] 10/06/2019 1:15 PM Medical Record [id number] Patient Account Number: [id number] Date of Birth/Sex: Treating RN: 1976-03-06 (43 y.o. Janyth Contes Primary Care Marquette Blodgett: Chana Bode Other Clinician: Referring Rodderick Holtzer: Treating Lokelani Lutes/Extender:Robson, Hurshel Keys, Bobbye Riggs in Treatment: 0 Primary Learner Assessed: Patient Learning Preferences/Education Level/Primary Language Learning Preference: Explanation, Demonstration, Printed Material Highest Education Level: College or Above Preferred Language: English Cognitive Barrier Language Barrier: No Translator Needed: No Memory Deficit: No Emotional Barrier: No Cultural/Religious Beliefs Affecting Medical Care: No Physical Barrier Impaired Vision: No Impaired Hearing: No Decreased Hand dexterity: No Knowledge/Comprehension Knowledge Level: High Comprehension Level: High Ability to understand written High instructions: Ability to understand verbal High instructions: Motivation Anxiety Level: Calm Cooperation: Cooperative Education Importance: Acknowledges Need Interest in Health Problems: Asks Questions Perception: Coherent Willingness to Engage in Self- High Management Activities: Readiness to Engage in Self- High Management Activities: Electronic Signature(s) Signed: 10/10/2019 5:51:44 PM By: Levan Hurst RN, BSN Entered By: Levan Hurst on 10/06/2019 14:30:33 -------------------------------------------------------------------------------- Fall Risk Assessment Details Patient Name: Date of Service: [name] 10/06/2019 1:15 PM Medical Record [id number] Patient Account Number: [id number] Date of Birth/Sex: Treating RN: 1976/01/30 (43 y.o. Janyth Contes Primary Care Nyasiah Moffet:  Chana Bode Other Clinician: Referring Rhylynn Perdomo: Treating Jerol Rufener/Extender:Robson, Hurshel Keys, Bobbye Riggs in Treatment: 0 Fall Risk Assessment Items Have you had 2 or more falls in the last 12 monthso 0 No Have you had any fall that resulted in injury in the last 12 monthso 0 No FALLS RISK SCREEN History of falling - immediate or within 3 months 25 Yes Secondary diagnosis (Do you have 2 or more medical diagnoseso) 0 No Ambulatory aid None/bed rest/wheelchair/nurse 0 Yes Crutches/cane/walker 0 No Furniture 0 No Intravenous therapy Access/Saline/Heparin Lock 0 No Weak (short steps with or without shuffle, stooped but able to lift head 0 No while walking, may seek support from furniture) Impaired (  short steps with shuffle, may have difficulty arising from chair, 0 No head down, impaired balance) Mental Status Oriented to own ability 0 Yes Overestimates or forgets limitations 0 No Risk Level: Medium Risk Score: 25 Electronic Signature(s) Signed: 10/10/2019 5:51:44 PM By: Zandra Abts RN, BSN Entered By: Zandra Abts on 10/06/2019 14:30:55 -------------------------------------------------------------------------------- Foot Assessment Details Patient Name: Date of Service: [name] 10/06/2019 1:15 PM Medical Record [id number] Patient Account Number: [id number] Date of Birth/Sex: Treating RN: Dec 16, 1975 (43 y.o. Elizebeth Koller Primary Care Irving Bloor: Crosby Oyster Other Clinician: Referring Nyzier Boivin: Treating Kaiana Marion/Extender:Robson, Tonette Lederer, Reece Packer in Treatment: 0 Foot Assessment Items Site Locations + = Sensation present, - = Sensation absent, C = Callus, U = Ulcer R = Redness, W = Warmth, M = Maceration, PU = Pre-ulcerative lesion F = Fissure, S = Swelling, D = Dryness Assessment Right: Left: Other Deformity: No No Prior Foot Ulcer: No No Prior Amputation: No No Charcot Joint: No No Ambulatory Status: Ambulatory Without  Help Gait: Steady Electronic Signature(s) Signed: 10/10/2019 5:51:44 PM By: Zandra Abts RN, BSN Entered By: Zandra Abts on 10/06/2019 14:32:08 -------------------------------------------------------------------------------- Nutrition Risk Screening Details Patient Name: Date of Service: [name] 10/06/2019 1:15 PM Medical Record [id number] Patient Account Number: [id number] Date of Birth/Sex: Treating RN: 11/03/1976 (43 y.o. Elizebeth Koller Primary Care Kiannah Grunow: Crosby Oyster Other Clinician: Referring Bobby Ragan: Treating Jolleen Seman/Extender:Robson, Tonette Lederer, Reece Packer in Treatment: 0 Height (in): Weight (lbs): Body Mass Index (BMI): Nutrition Risk Screening Items Score Screening NUTRITION RISK SCREEN: I have an illness or condition that made me change the kind and/or 0 No amount of food I eat I eat fewer than two meals per day 0 No I eat few fruits and vegetables, or milk products 0 No I have three or more drinks of beer, liquor or wine almost every day 0 No I have tooth or mouth problems that make it hard for me to eat 0 No I don't always have enough money to buy the food I need 0 No I eat alone most of the time 0 No I take three or more different prescribed or over-the-counter drugs a day 1 Yes 0 No Without wanting to, I have lost or gained 10 pounds in the last six months I am not always physically able to shop, cook and/or feed myself 0 No Nutrition Protocols Good Risk Protocol Provide education on Moderate Risk Protocol 0 nutrition High Risk Proctocol Risk Level: Good Risk Score: 1 Electronic Signature(s) Signed: 10/10/2019 5:51:44 PM By: Zandra Abts RN, BSN Entered By: Zandra Abts on 10/06/2019 14:31:39

## 2019-10-10 NOTE — Progress Notes (Signed)
ARVIND, MEXICANO ([id number]) Visit Report for 10/06/2019 Chief Complaint Document Details Patient Name: Date of Service: KENNEY, GOING 10/06/2019 1:15 PM Medical Record Number:[id number] Patient Account Number: [id number] Date of Birth/Sex: Treating RN: 01/05/1976 (43 y.o. M) Primary Care Provider: Crosby Oyster Other Clinician: Referring Provider: Treating Provider/Extender:Laila Myhre, Tonette Lederer, Reece Packer in Treatment: 0 Information Obtained from: Patient Chief Complaint 11/23/2018; patient is here for review of an ulcer on the left posterior calf 10/06/2019; patient is here for review of the wound area from the previous visit which is on the left posterior lateral calf Electronic Signature(s) Signed: 10/07/2019 7:57:15 AM By: Baltazar Najjar MD Entered By: Baltazar Najjar on 10/06/2019 15:36:34 -------------------------------------------------------------------------------- HPI Details Patient Name: Date of Service: [name] 10/06/2019 1:15 PM Medical Record [id number] Patient Account Number: [id number] Date of Birth/Sex: Treating RN: [birth year] (43 y.o. M) Primary Care Provider: Crosby Oyster Other Clinician: Referring Provider: Treating Provider/Extender:Aitana Burry, Tonette Lederer, Reece Packer in Treatment: 0 History of Present Illness HPI Description: Admission 11/23/2018 This is a 43 year old man with type 2 diabetes. He tells [place] that about a week before Thanksgiving he noticed a small painful area on the back of his left mid calf. He saw a physician at urgent care on 10/10/2018 noted to have an open wound on the left calf. He was given Bactroban and Keflex as CandS was apparently negative. He saw his own primary doctor on 612/31/19 and was given Silvadene and doxycycline. He has been using Silvadene since then. He is saw his primary physician again on 1/10 was offered an Radio broadcast assistant but I do not think that he actually use this. The area in question is on the  left posterior calf. There is some question that this was a insect bite and when he first presented but he certainly did not notice his any particular bite injury at that time. This did actually apparently at one point look as though it was progressing towards closure although it then it opened again. He has been using Silvadene for the past 3 weeks Past medical history; obstructive sleep apnea, Achilles tendinitis, hypertension, type 2 diabetes, cholecystectomy, fatty liver, ABI in this clinic was 1.38 on the left 11/30/2018; the area is essentially unchanged in size although the surface of the wound looks better. He has surrounding inflammation around the wound the exact etiology of this is unclear. 12/07/2018; the area has come down in diameter. Surface requires debridement. We have been using silver collagen. Is the wound seems to have come down in size I have elected not to biopsy this. Surrounding inflammation is also less 2/11; the area continues to come down in size and generally looks healthy. We have been using silver collagen under compression surrounding inflammation is less there is no tenderness 2/18; the area continues to contract in size and looks healthy. We have been using silver collagen under compression 2/25; no major change in dimension this week. However the wound bed continues to look healthy and there appears to be less inflammation around the wound 3/3; the wound measures slightly smaller. Healthy looking surface. Less inflammation 3/10; wound is closed. Healthy looking surface. Still some discoloration around the wound area but absolutely no tenderness. The cause of this wound was never really determined. However it progressed nicely towards healing READMISSION 10/06/2019 This is a patient I dealt with with an unusual wound on the left posterior lateral calf in the early part of this year. I was never really completely sure I understood what this was in terms  of  causation. However wde use silver collagen under compression and the area responded nicely and we are able to discharge him after roughly 6 weeks in the clinic. He did not clearly have arterial or venous issues. I had some thoughts about biopsying this area however acid continued to get better I did not go through with this. He states that everything is been fine up until the last few weeks. His wife noted a scab with perhaps some purulent drainage. They called primary care and was prescribed doxy which she is finished and everything seems to have settled down there was no open area however the patient wanted [place] to look at this. Electronic Signature(s) Signed: 10/07/2019 7:57:15 AM By: Baltazar Najjar MD Entered By: Baltazar Najjar on 10/06/2019 15:38:53 -------------------------------------------------------------------------------- Physical Exam Details Patient Name: Date of Service: [name] 10/06/2019 1:15 PM Medical Record [id number] Patient Account Number: [id number] Date of Birth/Sex: Treating RN: Oct 22, 1976 (43 y.o. M) Primary Care Provider: Crosby Oyster Other Clinician: Referring Provider: Treating Provider/Extender:Marianita Botkin, Tonette Lederer, Reece Packer in Treatment: 0 Constitutional Patient is hypertensive.. Pulse regular and within target range for patient.[name] Respirations regular, non-labored and within target range.. Temperature is normal and within the target range for the patient.[name] Appears in no distress. Eyes Conjunctivae clear. No discharge.no icterus. Respiratory work of breathing is normal. Cardiovascular Fetal pulses are palpable. There is not much in the way of edema no signs of chronic venous insufficiency. Integumentary (Hair, Skin) There is no erythema around the darkened area no tenderness no crepitus. Psychiatric appears at normal baseline. Notes Wound exam; left posterior lateral calf there is no open wound here. The area where his wound was  previously is darker than his normal skino Hemosiderin. There is no drainage no tenderness. Skin is somewhat dry Electronic Signature(s) Signed: 10/07/2019 7:57:15 AM By: Baltazar Najjar MD Entered By: Baltazar Najjar on 10/06/2019 15:40:30 -------------------------------------------------------------------------------- Physician Orders Details Patient Name: Date of Service: [name] 10/06/2019 1:15 PM Medical Record [id number] Patient Account Number: [id number] Date of Birth/Sex: Treating RN: [birth year] (43 y.o. Tammy Sours Primary Care Provider: Crosby Oyster Other Clinician: Referring Provider: Treating Provider/Extender:Rajanae Mantia, Tonette Lederer, Reece Packer in Treatment: 0 Verbal / Phone Orders: No Diagnosis Coding Discharge From [facility] Services Discharge from W[facility] Care Center - call if any future wound care needs. Skin Barriers/Peri-Wound Care Moisturizing lotion - lotion to both legs daily. Electronic Signature(s) Signed: 10/06/2019 6:37:14 PM By: Shawn Stall Signed: 10/07/2019 7:57:15 AM By: Baltazar Najjar MD Entered By: Shawn Stall on 10/06/2019 15:07:31 -------------------------------------------------------------------------------- Problem List Details Patient Name: Date of Service: [name] 10/06/2019 1:15 PM Medical Record [id number] Patient Account Number: [id number] Date of Birth/Sex: Treating RN: 1976-05-29 (43 y.o. M) Primary Care Provider: Crosby Oyster Other Clinician: Referring Provider: Treating Provider/Extender:Kiarra Kidd, Tonette Lederer, Reece Packer in Treatment: 0 Active Problems ICD-10 Evaluated Encounter Code Description Active Date Today Diagnosis L97.221 Non-pressure chronic ulcer of left calf limited to 10/06/2019 No Yes breakdown of skin E11.622 Type 2 diabetes mellitus with other skin ulcer 10/06/2019 No Yes Inactive Problems Resolved Problems Electronic Signature(s) Signed: 10/07/2019 7:57:15 AM By: Baltazar Najjar MD Entered By: Baltazar Najjar on 10/06/2019 15:17:17 -------------------------------------------------------------------------------- Progress Note Details Patient Name: Date of Service: [name] 10/06/2019 1:15 PM Medical Record [id number] Patient Account Number: [id number] Date of Birth/Sex: Treating RN: 06/29/1976 (43 y.o. M) Primary Care Provider: Crosby Oyster Other Clinician: Referring Provider: Treating Provider/Extender:Kleber Crean, Tonette Lederer, Reece Packer in Treatment: 0 Subjective Chief Complaint Information obtained from Patient 11/23/2018; patient is  here for review of an ulcer on the left posterior calf 10/06/2019; patient is here for review of the wound area from the previous visit which is on the left posterior lateral calf History of Present Illness (HPI) Admission 11/23/2018 This is a 43 year old man with type 2 diabetes. He tells us that about a week before Thanksgiving he noticed a small painful area on the back of his left mid calf. He saw a physician at urgent care on 10/10/2018 noted to have an open wound on the left calf. He was given Bactroban and Keflex as CandS was apparently negative. He saw his own primary doctor on 612/31/19 and was given Silvadene and doxycycline. He has been using Silvadene since then. He is saw his primary physician again on 1/10 was offered an Radio broadcast assistantUnna boot but I do not think that he actually use this. The area in question is on the left posterior calf. There is some question that this was a insect bite and when he first presented but he certainly did not notice his any particular bite injury at that time. This did actually apparently at one point look as though it was progressing towards closure although it then it opened again. He has been using Silvadene for the past 3 weeks Past medical history; obstructive sleep apnea, Achilles tendinitis, hypertension, type 2 diabetes, cholecystectomy, fatty liver, ABI in this  clinic was 1.38 on the left 11/30/2018; the area is essentially unchanged in size although the surface of the wound looks better. He has surrounding inflammation around the wound the exact etiology of this is unclear. 12/07/2018; the area has come down in diameter. Surface requires debridement. We have been using silver collagen. Is the wound seems to have come down in size I have elected not to biopsy this. Surrounding inflammation is also less 2/11; the area continues to come down in size and generally looks healthy. We have been using silver collagen under compression surrounding inflammation is less there is no tenderness 2/18; the area continues to contract in size and looks healthy. We have been using silver collagen under compression 2/25; no major change in dimension this week. However the wound bed continues to look healthy and there appears to be less inflammation around the wound 3/3; the wound measures slightly smaller. Healthy looking surface. Less inflammation 3/10; wound is closed. Healthy looking surface. Still some discoloration around the wound area but absolutely no tenderness. The cause of this wound was never really determined. However it progressed nicely towards healing READMISSION 10/06/2019 This is a patient I dealt with with an unusual wound on the left posterior lateral calf in the early part of this year. I was never really completely sure I understood what this was in terms of causation. However wde use silver collagen under compression and the area responded nicely and we are able to discharge him after roughly 6 weeks in the clinic. He did not clearly have arterial or venous issues. I had some thoughts about biopsying this area however acid continued to get better I did not go through with this. He states that everything is been fine up until the last few weeks. His wife noted a scab with perhaps some purulent drainage. They called primary care and was  prescribed doxy which she is finished and everything seems to have settled down there was no open area however the patient wanted us to look at this. Patient History Information obtained from Patient. Allergies No Known Allergies Family History Diabetes - Paternal Grandparents, Hypertension -  Mother, Kidney Disease - Father, No family history of Cancer, Heart Disease, Hereditary Spherocytosis, Lung Disease, Seizures, Stroke, Thyroid Problems, Tuberculosis. Social History Never smoker, Marital Status - Married, Alcohol Use - Moderate, Drug Use - Prior History - [employer] in college, Caffeine Use - Daily - coffee. Medical History Eyes Denies history of Cataracts, Glaucoma, Optic Neuritis Ear/Nose/Mouth/Throat Denies history of Chronic sinus problems/congestion, Middle ear problems Hematologic/Lymphatic Denies history of Anemia, Hemophilia, Human Immunodeficiency Virus, Lymphedema, Sickle Cell Disease Respiratory Patient has history of Sleep Apnea - CPAP Denies history of Aspiration, Asthma, Chronic Obstructive Pulmonary Disease (COPD), Pneumothorax, Tuberculosis Cardiovascular Patient has history of Hypertension Denies history of Angina, Arrhythmia, Congestive Heart Failure, Coronary Artery Disease, Deep Vein Thrombosis, Hypotension, Myocardial Infarction, Peripheral Arterial Disease, Peripheral Venous Disease, Phlebitis, Vasculitis Gastrointestinal Denies history of Cirrhosis , Colitis, Crohnoos, Hepatitis A, Hepatitis B, Hepatitis C Endocrine Patient has history of Type II Diabetes Denies history of Type I Diabetes Genitourinary Denies history of End Stage Renal Disease Immunological Denies history of Lupus Erythematosus, Raynaudoos, Scleroderma Integumentary (Skin) Denies history of History of Burn Musculoskeletal Denies history of Gout, Rheumatoid Arthritis, Osteoarthritis, Osteomyelitis Neurologic Denies history of Dementia, Neuropathy, Quadriplegia, Paraplegia, Seizure  Disorder Oncologic Denies history of Received Chemotherapy, Received Radiation Psychiatric Denies history of Anorexia/bulimia, Confinement Anxiety Hospitalization/Surgery History - cholecystectomy. - right ankle sugery for club foot. Medical And Surgical History Notes Constitutional Symptoms (General Health) morbid obesity Ear/Nose/Mouth/Throat nasal congestion Cardiovascular hyperlipidemia Review of Systems (ROS) Constitutional Symptoms (General Health) Denies complaints or symptoms of Fatigue, Fever, Chills, Marked Weight Change. Eyes Denies complaints or symptoms of Dry Eyes, Vision Changes, Glasses / Contacts. Ear/Nose/Mouth/Throat Denies complaints or symptoms of Chronic sinus problems or rhinitis. Gastrointestinal Denies complaints or symptoms of Frequent diarrhea, Nausea, Vomiting. Genitourinary Denies complaints or symptoms of Frequent urination. Integumentary (Skin) Denies complaints or symptoms of Wounds. Musculoskeletal Denies complaints or symptoms of Muscle Pain, Muscle Weakness. Neurologic Denies complaints or symptoms of Numbness/parasthesias. Psychiatric Denies complaints or symptoms of Claustrophobia, Suicidal. Objective Constitutional Patient is hypertensive.. Pulse regular and within target range for patient.[name] Respirations regular, non-labored and within target range.. Temperature is normal and within the target range for the patient.[name] Appears in no distress. Vitals Time Taken: 2:26 PM, Temperature: 98.9 F, Pulse: 98 bpm, Respiratory Rate: 18 breaths/min, Blood Pressure: 164/95 mmHg. Eyes Conjunctivae clear. No discharge.no icterus. Respiratory work of breathing is normal. Cardiovascular Fetal pulses are palpable. There is not much in the way of edema no signs of chronic venous insufficiency. Psychiatric appears at normal baseline. General Notes: Wound exam; left posterior lateral calf there is no open wound here. The area where his wound  was previously is darker than his normal skino Hemosiderin. There is no drainage no tenderness. Skin is somewhat dry Integumentary (Hair, Skin) There is no erythema around the darkened area no tenderness no crepitus. Assessment Active Problems ICD-10 Non-pressure chronic ulcer of left calf limited to breakdown of skin Type 2 diabetes mellitus with other skin ulcer Plan Discharge From [facility] Services: Discharge from Wound Care Center - call if any future wound care needs. Skin Barriers/Peri-Wound Care: Moisturizing lotion - lotion to both legs daily. 1. The patient does not have an open wound. 2. He states he had a draining small area that resolved after his primary care gave him doxycycline. 3. I had some concerns the last time he was here that this was an inflammatory ulcer/noninfectious. I stated in my last note that if he came back in I would consider biopsying this and  I have the same feeling. It is difficult to justify however doing this when he has no open wound in the area looks about the same as when he left. 4. I have asked him if this happens in the future to take pictures of what they were seeing on his phone so that when he comes into the clinic we can get a better idea of things. I have asked him to keep this area moist. At this point no indication for compression stock Electronic Signature(s) Signed: 10/07/2019 7:57:15 AM By: Baltazar Najjar MD Entered By: Baltazar Najjar on 10/06/2019 15:41:56 -------------------------------------------------------------------------------- HxROS Details Patient Name: Date of Service: [name] 10/06/2019 1:15 PM Medical Record [id number] Patient Account Number: [id number] Date of Birth/Sex: Treating RN: 1975/12/20 (43 y.o. Elizebeth Koller Primary Care Provider: Crosby Oyster Other Clinician: Referring Provider: Treating Provider/Extender:Delron Comer, Tonette Lederer, Reece Packer in Treatment: 0 Information Obtained  From Patient Constitutional Symptoms (General Health) Complaints and Symptoms: Negative for: Fatigue; Fever; Chills; Marked Weight Change Medical History: Past Medical History Notes: morbid obesity Eyes Complaints and Symptoms: Negative for: Dry Eyes; Vision Changes; Glasses / Contacts Medical History: Negative for: Cataracts; Glaucoma; Optic Neuritis Ear/Nose/Mouth/Throat Complaints and Symptoms: Negative for: Chronic sinus problems or rhinitis Medical History: Negative for: Chronic sinus problems/congestion; Middle ear problems Past Medical History Notes: nasal congestion Gastrointestinal Complaints and Symptoms: Negative for: Frequent diarrhea; Nausea; Vomiting Medical History: Negative for: Cirrhosis ; Colitis; Crohns; Hepatitis A; Hepatitis B; Hepatitis C Genitourinary Complaints and Symptoms: Negative for: Frequent urination Medical History: Negative for: End Stage Renal Disease Integumentary (Skin) Complaints and Symptoms: Negative for: Wounds Medical History: Negative for: History of Burn Musculoskeletal Complaints and Symptoms: Negative for: Muscle Pain; Muscle Weakness Medical History: Negative for: Gout; Rheumatoid Arthritis; Osteoarthritis; Osteomyelitis Neurologic Complaints and Symptoms: Negative for: Numbness/parasthesias Medical History: Negative for: Dementia; Neuropathy; Quadriplegia; Paraplegia; Seizure Disorder Psychiatric Complaints and Symptoms: Negative for: Claustrophobia; Suicidal Medical History: Negative for: Anorexia/bulimia; Confinement Anxiety Hematologic/Lymphatic Medical History: Negative for: Anemia; Hemophilia; Human Immunodeficiency Virus; Lymphedema; Sickle Cell Disease Respiratory Medical History: Positive for: Sleep Apnea - CPAP Negative for: Aspiration; Asthma; Chronic Obstructive Pulmonary Disease (COPD); Pneumothorax; Tuberculosis Cardiovascular Medical History: Positive for: Hypertension Negative for: Angina;  Arrhythmia; Congestive Heart Failure; Coronary Artery Disease; Deep Vein Thrombosis; Hypotension; Myocardial Infarction; Peripheral Arterial Disease; Peripheral Venous Disease; Phlebitis; Vasculitis Past Medical History Notes: hyperlipidemia Endocrine Medical History: Positive for: Type II Diabetes Negative for: Type I Diabetes Time with diabetes: 1 year Treated with: Oral agents Blood sugar tested every day: No Immunological Medical History: Negative for: Lupus Erythematosus; Raynauds; Scleroderma Oncologic Medical History: Negative for: Received Chemotherapy; Received Radiation Immunizations Pneumococcal Vaccine: Received Pneumococcal Vaccination: No Implantable Devices None Hospitalization / Surgery History Type of Hospitalization/Surgery 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 Family and Social History Cancer: No; Diabetes: Yes - Paternal Grandparents; Heart Disease: No; Hereditary Spherocytosis: No; Hypertension: Yes - Mother; Kidney Disease: Yes - Father; Lung Disease: No; Seizures: No; Stroke: No; Thyroid Problems: No; Tuberculosis: No; Never smoker; Marital Status - Married; Alcohol Use: Moderate; Drug Use: Prior History - [employer] in college; Caffeine Use: Daily - coffee; Financial Concerns: No; Food, Clothing or Shelter Needs: No; Support System Lacking: No; Transportation Concerns: No Electronic Signature(s) Signed: 10/07/2019 7:57:15 AM By: Baltazar Najjar MD Signed: 10/10/2019 5:51:44 PM By: Zandra Abts RN, BSN Entered By: Zandra Abts on 10/06/2019 14:29:38 -------------------------------------------------------------------------------- SuperBill Details Patient Name: Date of Service: [name] 10/06/2019 Medical Record [id number] Patient Account Number: [id number] Date of Birth/Sex: Treating RN:  08-23-1976 (43 y.o. Hessie Diener Primary Care Provider: Chana Bode Other Clinician: Referring Provider: Treating Provider/Extender:Madine Sarr,  Hurshel Keys, Bobbye Riggs in Treatment: 0 Diagnosis Coding ICD-10 Codes Code Description [phone] Non-pressure chronic ulcer of left calf limited to breakdown of skin E11.622 Type 2 diabetes mellitus with other skin ulcer Facility Procedures Physician Procedures CPT4 Code Description: [id number] - WC PHYS LEVEL 3 - EST PT ICD-10 Diagnosis Description L97.221 Non-pressure chronic ulcer of left calf limited to bre E11.622 Type 2 diabetes mellitus with other skin ulcer Modifier: akdown of skin Quantity: 1 Electronic Signature(s) Signed: 10/07/2019 7:57:15 AM By: Linton Ham MD Entered By: Linton Ham on 10/06/2019 15:42:12

## 2019-10-10 NOTE — Progress Notes (Addendum)
[name], [name] B. ([id number]) Visit Report for 10/06/2019 Allergy List Details Patient Name: Date of Service: [name], [name] B. 10/06/2019 1:15 PM Medical Record Number:[id number] Patient Account Number: [id number] Date of Birth/Sex: Treating RN: 06/13/1976 (43 y.o. [name]) [name], [name] Primary Care [name]: [name], [name] Other Clinician: Referring [name]: Treating [name]/Extender:[name], [name], [name] in Treatment: 0 Allergies Active Allergies No Known Allergies Allergy Notes Electronic Signature(s) Signed: 10/10/2019 5:51:44 PM By: Zandra AbtsLynch, Shatara RN, BSN Entered By: Zandra AbtsLynch, [name] on 10/06/2019 14:28:20 -------------------------------------------------------------------------------- Arrival Information Details Patient Name: Date of Service: [name], [name] B. 10/06/2019 1:15 PM Medical Record [id number] Patient Account Number: [id number] Date of Birth/Sex: Treating RN: 07/28/1976 (43 y.o. [name]) [name], [name] Primary Care [name]: [name], [name] Other Clinician: Referring [name]: Treating [name]/Extender:[name], [name], [name] in Treatment: 0 Visit Information Patient Arrived: Ambulatory Arrival Time: 14:23 Accompanied By: alone Transfer Assistance: None Patient Identification Verified: Yes Secondary Verification Process Yes Completed: Patient Requires Transmission-Based No Precautions: Patient Has Alerts: No Electronic Signature(s) Signed: 10/10/2019 5:51:44 PM By: Zandra AbtsLynch, Shatara RN, BS Entered By: Darcey NoraLynch, Shat History Since Last Visit Added or deleted any medications: No Any new allergies or adverse reactions: No Had a fall or experienced change in activities of daily living that may affect risk of falls: No Signs or symptoms of abuse/neglect since last visito No Hospitalized since last visit: No Implantable device outside of the clinic excluding cellular tissue based products placed in the center since last visit: No Pain Present  Now: No N -------------------------------------------------------------------------------- Clinic Level of Care Assessment Details Patient Name: Date of Service: [name], [name] B. 10/06/2019 1:15 PM Medical Record Number:[id number] Patient Account Number: [id number] Date of Birth/Sex: Treating RN: 01/13/1976 (43 y.o. [name]) Deaton, [name] Primary Care [name]: [name], [name] Other Clinician: Referring [name]: Treating [name]/Extender:[name], [name], [name] in Treatment: 0 Clinic Level of Care Assessment Items TOOL 2 Quantity Score X - Use when only an EandM is performed on the INITIAL visit 1 0 ASSESSMENTS - Nursing Assessment / Reassessment X - General Physical Exam (combine w/ comprehensive assessment (listed just below) 1 20 when performed on new pt. evals) X - Comprehensive Assessment (HX, ROS, Risk Assessments, Wounds Hx, etc.) 1 25 ASSESSMENTS - Wound and Skin Assessment / Reassessment []  - Simple Wound Assessment / Reassessment - one wound 0 []  - Complex Wound Assessment / Reassessment - multiple wounds 0 X - Dermatologic / Skin Assessment (not related to wound area) 1 10 ASSESSMENTS - Ostomy and/or Continence Assessment and Care []  - Incontinence Assessment and Management 0 []  - Ostomy Care Assessment and Management (repouching, etc.) 0 PROCESS - Coordination of Care X - Simple Patient / Family Education for ongoing care 1 15 []  - Complex (extensive) Patient / Family Education for ongoing care 0 X - Staff obtains ChiropractorConsents, Records, Test Results / Process Orders 1 10 []  - Staff telephones HHA, Nursing Homes / Clarify orders / etc 0 []  - Routine Transfer to another Facility (non-emergent condition) 0 []  - Routine Hospital Admission (non-emergent condition) 0 []  - New Admissions / Manufacturing engineernsurance Authorizations / Ordering NPWT, Apligraf, etc. 0 []  - Emergency Hospital Admission (emergent condition) 0 X - Simple Discharge Coordination 1 10 []  - Complex (extensive)  Discharge Coordination 0 PROCESS - Special Needs []  - Pediatric / Minor Patient Management 0 []  - Isolation Patient Management 0 []  - Hearing / Language / Visual special needs 0 []  - Assessment of Community assistance (transportation, D/C planning, etc.) 0 []  - Additional assistance / Altered mentation 0 []  -  Support Surface(s) Assessment (bed, cushion, seat, etc.) 0 INTERVENTIONS - Wound Cleansing / Measurement []  - Wound Imaging (photographs - any number of wounds) 0 []  - Wound Tracing (instead of photographs) 0 []  - Simple Wound Measurement - one wound 0 []  - Complex Wound Measurement - multiple wounds 0 []  - Simple Wound Cleansing - one wound 0 []  - Complex Wound Cleansing - multiple wounds 0 INTERVENTIONS - Wound Dressings []  - Small Wound Dressing one or multiple wounds 0 []  - Medium Wound Dressing one or multiple wounds 0 []  - Large Wound Dressing one or multiple wounds 0 []  - Application of Medications - injection 0 INTERVENTIONS - Miscellaneous []  - External ear exam 0 []  - Specimen Collection (cultures, biopsies, blood, body fluids, etc.) 0 []  - Specimen(s) / Culture(s) sent or taken to Lab for analysis 0 []  - Patient Transfer (multiple staff / Civil Service fast streamer / Similar devices) 0 []  - Simple Staple / Suture removal (25 or less) 0 []  - Complex Staple / Suture removal (26 or more) 0 []  - Hypo / Hyperglycemic Management (close monitor of Blood Glucose) 0 []  - Ankle / Brachial Index (ABI) - do not check if billed separately 0 Has the patient been seen at the hospital within the last three years: Yes Total Score: 90 Level Of Care: New/Established - Level 3 Electronic Signature(s) Signed: 10/06/2019 6:37:14 PM By: Deon Pilling Entered By: Deon Pilling on 10/06/2019 15:07:12 -------------------------------------------------------------------------------- Encounter Discharge Information Details Patient Name: Date of Service: [name] 10/06/2019 1:15 PM Medical Record  [id number] Patient Account Number: [id number] Date of Birth/Sex: Treating RN: 02/08/1976 (43 y.o. Hessie Diener Primary Care Dorathy Stallone: Chana Bode Other Clinician: Referring Brielynn Sekula: Treating Calahan Pak/Extender:[name], Hurshel Keys, Bobbye Riggs in Treatment: 0 Encounter Discharge Information Items Discharge Condition: Stable Ambulatory Status: Ambulatory Discharge Destination: Home Transportation: Private Auto Accompanied By: self Schedule Follow-up Appointment: Yes Clinical Summary of Care: Electronic Signature(s) Signed: 10/06/2019 6:37:14 PM By: Deon Pilling Entered By: Deon Pilling on 10/06/2019 18:07:08 -------------------------------------------------------------------------------- Lower Extremity Assessment Details Patient Name: Date of Service: [name], [name] 10/06/2019 1:15 PM Medical Record [id number] Patient Account Number: [id number] Date of Birth/Sex: Treating RN: [birth year] (43 y.o. Janyth Contes Primary Care Jaesean Litzau: Chana Bode Other Clinician: Referring Aydee Mcnew: Treating Amani Marseille/Extender:[name], Hurshel Keys, Bobbye Riggs in Treatment: 0 Edema Assessment Assessed: [Left: No] [Right: No] E[Left: dema] [Right: :] Calf Left: Right: Point of Measurement: 34 cm From Medial Instep 45.4 cm cm Ankle Left: Right: Point of Measurement: 10 cm From Medial Instep 26 cm cm Vascular Assessment Pulses: Dorsalis Pedis Palpable: [Left:Yes] Electronic Signature(s) Signed: 10/10/2019 5:51:44 PM By: Levan Hurst RN, BSN Entered By: Levan Hurst on 10/06/2019 14:33:15 -------------------------------------------------------------------------------- [place] Details Patient Name: Date of Service: [name] 10/06/2019 1:15 PM Medical Record [id number] Patient Account Number: [id number] Date of Birth/Sex: Treating RN: 03/21/1976 (43 y.o. Hessie Diener Primary Care Ashelynn Marks: Chana Bode Other  Clinician: Referring Aniel Hubble: Treating Breckon Reeves/Extender:[name], Hurshel Keys, Bobbye Riggs in Treatment: 0 Active Inactive Electronic Signature(s) Signed: 10/06/2019 6:37:14 PM By: Deon Pilling Entered By: Deon Pilling on 10/06/2019 14:41:11 -------------------------------------------------------------------------------- Non-Wound Condition Assessment Details Patient Name: Date of Service: [name], [name] 10/06/2019 1:15 PM Medical Record [id number] Patient Account Number: [id number] Date of Birth/Sex: Treating RN: 04-19-1976 (43 y.o. Hessie Diener Primary Care Jaquila Santelli: Chana Bode Other Clinician: Referring Santasia Rew: Treating Carrie Usery/Extender:[name], Hurshel Keys, Bobbye Riggs in Treatment: 0 Non-Wound Condition: Condition: Other Dermatologic Condition Location: Leg Side: Left Photos Notes dry, flaky, hemosidern staining at this area  of skin. Electronic Signature(s) Signed: 10/11/2019 4:34:42 PM By: Benjaman Kindler EMT/HBOT Signed: 10/11/2019 5:32:49 PM By: Shawn Stall Previous Signature: 10/06/2019 6:37:14 PM Version By: Shawn Stall Entered By: Benjaman Kindler on 10/11/2019 11:19:53 -------------------------------------------------------------------------------- Pain Assessment Details Patient Name: Date of Service: [name] 10/06/2019 1:15 PM Medical Record [id number] Patient Account Number: [id number] Date of Birth/Sex: Treating RN: [birth year] (43 y.o. [name] Koller Primary Care Anniebell Bedore: [name] Oyster Other Clinician: Referring Dagoberto Nealy: Treating Tynleigh Birt/Extender:[name], [name] Lederer, [name] Packer in Treatment: 0 Active Problems Location of Pain Severity and Description of Pain Patient Has Paino No Site Locations Pain Management and Medication Current Pain Management: Electronic Signature(s) Signed: 10/10/2019 5:51:44 PM By: Zandra Abts RN, BSN Entered By: Zandra Abts on 10/06/2019  14:33:23 -------------------------------------------------------------------------------- Patient/Caregiver Education Details Patient Name: Date of Service: [name] 12/3/2020andnbsp1:15 PM Medical Record [id number] Patient Account Number: [id number] Date of Birth/Gender: 1976-07-09 (43 y.o. M) Treating RN: Shawn Stall Primary Care Physician: [name] Oyster Other Clinician: Referring Physician: Treating Physician/Extender:[name], [name] Lederer, [name] Packer in Treatment: 0 Education Assessment Education Provided To: Patient Education Topics Provided Wound/Skin Impairment: Handouts: Skin Care Do's and Dont's Methods: Explain/Verbal Responses: Reinforcements needed Electronic Signature(s) Signed: 10/06/2019 6:37:14 PM By: Shawn Stall Entered By: Shawn Stall on 10/06/2019 14:41:21 -------------------------------------------------------------------------------- Vitals Details Patient Name: Date of Service: [name] 10/06/2019 1:15 PM Medical Record [id number] Patient Account Number: [id number] Date of Birth/Sex: Treating RN: 1976-07-14 (43 y.o. [name] Koller Primary Care Acheron Sugg: [name] Oyster Other Clinician: Referring Daejah Klebba: Treating Lerae Langham/Extender:[name], [name] Lederer, [name] Packer in Treatment: 0 Vital Signs Time Taken: 14:26 Temperature (F): 98.9 Pulse (bpm): 98 Respiratory Rate (breaths/min): 18 Blood Pressure (mmHg): 164/95 Reference Range: 80 - 120 mg / dl Electronic Signature(s) Signed: 10/10/2019 5:51:44 PM By: Zandra Abts RN, BSN Entered By: Zandra Abts on 10/06/2019 14:28:06

## 2019-10-17 ENCOUNTER — Telehealth: Payer: Self-pay | Admitting: Medical

## 2019-10-17 NOTE — Telephone Encounter (Signed)
Received requested records from [facility]

## 2019-10-20 DIAGNOSIS — I129 Hypertensive chronic kidney disease with stage 1 through stage 4 chronic kidney disease, or unspecified chronic kidney disease: Secondary | ICD-10-CM | POA: Diagnosis not present

## 2019-10-20 DIAGNOSIS — E119 Type 2 diabetes mellitus without complications: Secondary | ICD-10-CM | POA: Diagnosis not present

## 2019-10-20 DIAGNOSIS — E876 Hypokalemia: Secondary | ICD-10-CM | POA: Diagnosis not present

## 2019-10-20 DIAGNOSIS — G4733 Obstructive sleep apnea (adult) (pediatric): Secondary | ICD-10-CM | POA: Diagnosis not present

## 2019-10-21 ENCOUNTER — Other Ambulatory Visit: Payer: Self-pay

## 2019-10-21 ENCOUNTER — Other Ambulatory Visit (INDEPENDENT_AMBULATORY_CARE_PROVIDER_SITE_OTHER): Payer: BC Managed Care – PPO

## 2019-10-21 DIAGNOSIS — E291 Testicular hypofunction: Secondary | ICD-10-CM | POA: Diagnosis not present

## 2019-10-21 MED ORDER — TESTOSTERONE CYPIONATE 200 MG/ML IM SOLN
200.0000 mg | Freq: Once | INTRAMUSCULAR | Status: AC
  Administered 2019-10-21: 16:00:00 200 mg via INTRAMUSCULAR

## 2019-10-31 ENCOUNTER — Telehealth: Payer: Self-pay

## 2019-10-31 NOTE — Telephone Encounter (Signed)
Pt. Is aware and has already come in for his Testosterone injection.

## 2019-10-31 NOTE — Telephone Encounter (Signed)
I submitted a PA for Testosterone solution and it was approved from 10/12/19-10/11/20.

## 2019-10-31 NOTE — Telephone Encounter (Signed)
Good.  Please let him know so we can get on track with this

## 2019-11-03 ENCOUNTER — Other Ambulatory Visit: Payer: Self-pay | Admitting: Medical

## 2019-11-06 ENCOUNTER — Encounter: Payer: Self-pay | Admitting: Medical

## 2019-11-07 ENCOUNTER — Telehealth: Payer: Self-pay

## 2019-11-07 ENCOUNTER — Other Ambulatory Visit: Payer: Self-pay

## 2019-11-07 MED ORDER — POTASSIUM CHLORIDE CRYS ER 20 MEQ PO TBCR: EXTENDED_RELEASE_TABLET | ORAL | 2 refills | Status: DC

## 2019-11-07 NOTE — Telephone Encounter (Signed)
Received a fax from [facility] stating that the pt. Needs a refill on his Potassium #90 with 3 refills. Last apt. Was 10/07/19.

## 2019-11-14 ENCOUNTER — Other Ambulatory Visit: Payer: Self-pay | Admitting: Medical

## 2019-11-14 ENCOUNTER — Telehealth: Payer: Self-pay | Admitting: Internal Medicine

## 2019-11-14 MED ORDER — TESTOSTERONE CYPIONATE 200 MG/ML IM SOLN: 200.0000 mg | INTRAMUSCULAR | 2 refills | Status: DC

## 2019-11-14 MED ORDER — TESTOSTERONE CYPIONATE 200 MG/ML IM SOLN: 200.0000 mg | INTRAMUSCULAR | 1 refills | Status: DC

## 2019-11-14 NOTE — Telephone Encounter (Signed)
Refill request for testosterone cyp to [facility]

## 2019-11-15 NOTE — Telephone Encounter (Signed)
done

## 2019-12-16 DIAGNOSIS — I129 Hypertensive chronic kidney disease with stage 1 through stage 4 chronic kidney disease, or unspecified chronic kidney disease: Secondary | ICD-10-CM | POA: Diagnosis not present

## 2019-12-16 DIAGNOSIS — Z9989 Dependence on other enabling machines and devices: Secondary | ICD-10-CM | POA: Diagnosis not present

## 2019-12-16 DIAGNOSIS — E876 Hypokalemia: Secondary | ICD-10-CM | POA: Diagnosis not present

## 2019-12-16 DIAGNOSIS — E119 Type 2 diabetes mellitus without complications: Secondary | ICD-10-CM | POA: Diagnosis not present

## 2019-12-19 ENCOUNTER — Other Ambulatory Visit: Payer: Self-pay | Admitting: Nephrology

## 2019-12-22 ENCOUNTER — Other Ambulatory Visit: Payer: Self-pay | Admitting: Nephrology

## 2019-12-22 ENCOUNTER — Other Ambulatory Visit: Payer: Self-pay | Admitting: Medical

## 2019-12-22 DIAGNOSIS — I129 Hypertensive chronic kidney disease with stage 1 through stage 4 chronic kidney disease, or unspecified chronic kidney disease: Secondary | ICD-10-CM

## 2019-12-22 DIAGNOSIS — E119 Type 2 diabetes mellitus without complications: Secondary | ICD-10-CM

## 2019-12-26 ENCOUNTER — Other Ambulatory Visit: Payer: Self-pay | Admitting: Medical

## 2019-12-26 ENCOUNTER — Telehealth: Payer: Self-pay

## 2019-12-26 ENCOUNTER — Other Ambulatory Visit: Payer: Self-pay | Admitting: Nephrology

## 2019-12-26 MED ORDER — TESTOSTERONE CYPIONATE 200 MG/ML IM SOLN: 200.0000 mg | INTRAMUSCULAR | 1 refills | Status: DC

## 2019-12-26 NOTE — Telephone Encounter (Signed)
Med sent.

## 2019-12-26 NOTE — Telephone Encounter (Signed)
I received a fax from [facility] to refill the pts. Testosterone cyp injections pt. Last apt. Was 10/07/19.

## 2019-12-27 NOTE — Telephone Encounter (Signed)
Sent patient a message on my chart

## 2019-12-29 ENCOUNTER — Ambulatory Visit
Admission: RE | Admit: 2019-12-29 | Discharge: 2019-12-29 | Disposition: A | Discharge status: Home / Self Care | Payer: BC Managed Care – PPO | Source: Ambulatory Visit | Attending: Nephrology | Admitting: Nephrology

## 2019-12-29 ENCOUNTER — Other Ambulatory Visit: Payer: Self-pay

## 2019-12-29 DIAGNOSIS — Z0389 Encounter for observation for other suspected diseases and conditions ruled out: Secondary | ICD-10-CM | POA: Diagnosis not present

## 2019-12-29 DIAGNOSIS — I129 Hypertensive chronic kidney disease with stage 1 through stage 4 chronic kidney disease, or unspecified chronic kidney disease: Secondary | ICD-10-CM

## 2019-12-29 DIAGNOSIS — E119 Type 2 diabetes mellitus without complications: Secondary | ICD-10-CM

## 2020-01-04 ENCOUNTER — Other Ambulatory Visit: Payer: Self-pay | Admitting: Medical

## 2020-01-04 DIAGNOSIS — Z79899 Other long term (current) drug therapy: Secondary | ICD-10-CM | POA: Diagnosis not present

## 2020-01-04 DIAGNOSIS — F419 Anxiety disorder, unspecified: Secondary | ICD-10-CM | POA: Diagnosis not present

## 2020-01-04 DIAGNOSIS — I10 Essential (primary) hypertension: Secondary | ICD-10-CM | POA: Diagnosis not present

## 2020-01-04 DIAGNOSIS — F902 Attention-deficit hyperactivity disorder, combined type: Secondary | ICD-10-CM | POA: Diagnosis not present

## 2020-01-12 DIAGNOSIS — R319 Hematuria, unspecified: Secondary | ICD-10-CM | POA: Diagnosis not present

## 2020-01-12 DIAGNOSIS — I129 Hypertensive chronic kidney disease with stage 1 through stage 4 chronic kidney disease, or unspecified chronic kidney disease: Secondary | ICD-10-CM | POA: Diagnosis not present

## 2020-01-12 DIAGNOSIS — E119 Type 2 diabetes mellitus without complications: Secondary | ICD-10-CM | POA: Diagnosis not present

## 2020-01-12 DIAGNOSIS — E876 Hypokalemia: Secondary | ICD-10-CM | POA: Diagnosis not present

## 2020-01-24 DIAGNOSIS — R351 Nocturia: Secondary | ICD-10-CM | POA: Diagnosis not present

## 2020-01-24 DIAGNOSIS — N401 Enlarged prostate with lower urinary tract symptoms: Secondary | ICD-10-CM | POA: Diagnosis not present

## 2020-01-24 DIAGNOSIS — R3121 Asymptomatic microscopic hematuria: Secondary | ICD-10-CM | POA: Diagnosis not present

## 2020-01-26 DIAGNOSIS — G4733 Obstructive sleep apnea (adult) (pediatric): Secondary | ICD-10-CM | POA: Diagnosis not present

## 2020-02-29 DIAGNOSIS — R3121 Asymptomatic microscopic hematuria: Secondary | ICD-10-CM | POA: Diagnosis not present

## 2020-03-31 ENCOUNTER — Other Ambulatory Visit: Payer: Self-pay | Admitting: Medical

## 2020-04-25 DIAGNOSIS — G4733 Obstructive sleep apnea (adult) (pediatric): Secondary | ICD-10-CM | POA: Diagnosis not present

## 2020-04-30 ENCOUNTER — Telehealth: Payer: Self-pay | Admitting: Medical

## 2020-04-30 NOTE — Telephone Encounter (Signed)
I received mailer from insurance that he is past due for eye exam.   Please call and remind him, and ask him to have eye doctor forward notes to [place] as well

## 2020-05-01 NOTE — Telephone Encounter (Signed)
Left detail message on machine for patient to call and schedule his appointment with his eye doctor and have them fax his results and note.

## 2020-06-06 ENCOUNTER — Other Ambulatory Visit: Payer: Self-pay | Admitting: Medical

## 2020-06-09 ENCOUNTER — Telehealth: Payer: Self-pay | Admitting: Medical

## 2020-06-09 NOTE — Telephone Encounter (Signed)
P.A. FARXIGA completed

## 2020-06-28 NOTE — Telephone Encounter (Signed)
P.A. was approved til 06/07/21 and pt has discount card on file.  Called pharmacy was already shipped with $15 co pay. Tried to call pt for appointment and no answer and unable to leave message

## 2020-07-06 DIAGNOSIS — F902 Attention-deficit hyperactivity disorder, combined type: Secondary | ICD-10-CM | POA: Diagnosis not present

## 2020-07-06 DIAGNOSIS — F419 Anxiety disorder, unspecified: Secondary | ICD-10-CM | POA: Diagnosis not present

## 2020-07-06 DIAGNOSIS — Z79899 Other long term (current) drug therapy: Secondary | ICD-10-CM | POA: Diagnosis not present

## 2020-07-06 DIAGNOSIS — G4733 Obstructive sleep apnea (adult) (pediatric): Secondary | ICD-10-CM | POA: Diagnosis not present

## 2020-07-13 ENCOUNTER — Other Ambulatory Visit: Payer: Self-pay | Admitting: Sleep Medicine

## 2020-07-13 ENCOUNTER — Other Ambulatory Visit: Payer: BC Managed Care – PPO

## 2020-07-13 DIAGNOSIS — I471 Supraventricular tachycardia, unspecified: Secondary | ICD-10-CM

## 2020-07-16 LAB — NOVEL CORONAVIRUS, NAA: SARS-CoV-2, NAA: NOT DETECTED

## 2020-07-25 DIAGNOSIS — G4733 Obstructive sleep apnea (adult) (pediatric): Secondary | ICD-10-CM | POA: Diagnosis not present

## 2020-08-12 ENCOUNTER — Other Ambulatory Visit: Payer: Self-pay | Admitting: Medical

## 2020-09-07 ENCOUNTER — Other Ambulatory Visit: Payer: Self-pay | Admitting: Medical

## 2020-10-12 DIAGNOSIS — F419 Anxiety disorder, unspecified: Secondary | ICD-10-CM | POA: Diagnosis not present

## 2020-10-12 DIAGNOSIS — F902 Attention-deficit hyperactivity disorder, combined type: Secondary | ICD-10-CM | POA: Diagnosis not present

## 2020-10-12 DIAGNOSIS — G4733 Obstructive sleep apnea (adult) (pediatric): Secondary | ICD-10-CM | POA: Diagnosis not present

## 2020-10-12 DIAGNOSIS — Z79899 Other long term (current) drug therapy: Secondary | ICD-10-CM | POA: Diagnosis not present

## 2020-10-15 ENCOUNTER — Encounter: Payer: Self-pay | Admitting: Medical

## 2020-10-15 ENCOUNTER — Other Ambulatory Visit: Payer: Self-pay

## 2020-10-15 ENCOUNTER — Ambulatory Visit (INDEPENDENT_AMBULATORY_CARE_PROVIDER_SITE_OTHER): Payer: BC Managed Care – PPO | Admitting: Medical

## 2020-10-15 VITALS — BP 144/92 | HR 95 | Ht 68.0 in | Wt 319.8 lb

## 2020-10-15 DIAGNOSIS — Z1159 Encounter for screening for other viral diseases: Secondary | ICD-10-CM | POA: Insufficient documentation

## 2020-10-15 DIAGNOSIS — E669 Obesity, unspecified: Secondary | ICD-10-CM | POA: Diagnosis not present

## 2020-10-15 DIAGNOSIS — Z7185 Encounter for immunization safety counseling: Secondary | ICD-10-CM

## 2020-10-15 DIAGNOSIS — Z0000 Encounter for general adult medical examination without abnormal findings: Secondary | ICD-10-CM

## 2020-10-15 DIAGNOSIS — Z9989 Dependence on other enabling machines and devices: Secondary | ICD-10-CM

## 2020-10-15 DIAGNOSIS — R7989 Other specified abnormal findings of blood chemistry: Secondary | ICD-10-CM

## 2020-10-15 DIAGNOSIS — Z79899 Other long term (current) drug therapy: Secondary | ICD-10-CM

## 2020-10-15 DIAGNOSIS — E291 Testicular hypofunction: Secondary | ICD-10-CM

## 2020-10-15 DIAGNOSIS — K760 Fatty (change of) liver, not elsewhere classified: Secondary | ICD-10-CM | POA: Diagnosis not present

## 2020-10-15 DIAGNOSIS — I10 Essential (primary) hypertension: Secondary | ICD-10-CM

## 2020-10-15 DIAGNOSIS — G4733 Obstructive sleep apnea (adult) (pediatric): Secondary | ICD-10-CM | POA: Diagnosis not present

## 2020-10-15 DIAGNOSIS — E1169 Type 2 diabetes mellitus with other specified complication: Secondary | ICD-10-CM

## 2020-10-15 DIAGNOSIS — E785 Hyperlipidemia, unspecified: Secondary | ICD-10-CM

## 2020-10-15 LAB — POCT URINALYSIS DIP (PROADVANTAGE DEVICE)
Bilirubin, UA: NEGATIVE
Glucose, UA: NEGATIVE mg/dL
Leukocytes, UA: NEGATIVE
Nitrite, UA: NEGATIVE
Protein Ur, POC: 30 mg/dL — AB
Specific Gravity, Urine: 1.025
Urobilinogen, Ur: 0.2
pH, UA: 6 (ref 5.0–8.0)

## 2020-10-15 NOTE — Progress Notes (Signed)
Subjective:   HPI  [name] is a 44 y.o. male who presents for Chief Complaint  Patient presents with  . Annual Exam    Physical with fasting labs     Patient Care Team: Meshilem Machuca, [name], PA-C as PCP - General (Family Medicine) Tannar Broker, Cleda Mccreedy [facility] Medicine) Sees dentist Sees eye doctor [facility] for psychiatry/mental health   Concerns: Exercise - not much of late.   Work - working from home.     3 sons, 1 waiting to hear back from college.     Diabetes - last visit 10/2019, not checking glucose  HTN - not checking home BPs.  No particular symptoms.   Reviewed their medical, surgical, family, social, medication, and allergy history and updated chart as appropriate.  Past Medical History:  Diagnosis Date  . Hypertension   . Migraine   . Nasal congestion 10/03/2014  . Non-insulin dependent type 2 diabetes mellitus (HCC)   . Obesity   . Sleep apnea    uses CPAP nightly  . Wears contact lenses     Past Surgical History:  Procedure Laterality Date  . CHOLECYSTECTOMY N/A 10/09/2014   Procedure: LAPAROSCOPIC CHOLECYSTECTOMY;  Surgeon: [name], MD;  Location: [facility];  Service: General;  Laterality: N/A;    Social History   Socioeconomic History  . Marital status: Married    Spouse name: Not on file  . Number of children: 1  . Years of education: Not on file  . Highest education level: Some college, no degree  Occupational History  . Not on file  Tobacco Use  . Smoking status: Never Smoker  . Smokeless tobacco: Never Used  Vaping Use  . Vaping Use: Never used  Substance and Sexual Activity  . Alcohol use: Yes    Comment: occasional  . Drug use: No  . Sexual activity: Yes  Other Topics Concern  . Not on file  Social History Narrative   Lives at home with wife & 3 children and a dog.    Married.  Wife has 2 children, so 3 in the household.  Exercise some with walking.  Works as Lawyer for [employer].     Drinks 1-2 cups of caffeine daily   Right handed   10/2020       Social Determinants of Health   Financial Resource Strain: Not on file  Food Insecurity: Not on file  Transportation Needs: Not on file  Physical Activity: Not on file  Stress: Not on file  Social Connections: Not on file  Intimate Partner Violence: Not on file    Family History  Problem Relation Age of Onset  . Hypertension Mother   . Kidney disease Father   . Diabetes Maternal Aunt   . Diabetes Paternal Aunt   . Cancer Maternal Grandfather        throat  . Stroke Paternal Grandmother   . Alcohol abuse Paternal Grandmother   . Heart disease Neg Hx      Current Outpatient Medications:  .  benazepril (LOTENSIN) 10 MG tablet, TAKE 1 TABLET DAILY, Disp: 90 tablet, Rfl: 3 .  FARXIGA 5 MG TABS tablet, TAKE 1 TABLET DAILY, Disp: 30 tablet, Rfl: 0 .  rosuvastatin (CRESTOR) 20 MG tablet, TAKE 1 TABLET AT BEDTIME, Disp: 90 tablet, Rfl: 3 .  spironolactone (ALDACTONE) 50 MG tablet, Take 50 mg by mouth daily., Disp: , Rfl:  .  tamsulosin (FLOMAX) 0.4 MG CAPS capsule, TAKE  1 CAPSULE DAILY AFTER SUPPER, Disp: 90 capsule, Rfl: 3 .  verapamil (CALAN) 80 MG tablet, TAKE 1 TABLET THREE TIMES A DAY, Disp: 270 tablet, Rfl: 3 .  VYVANSE 20 MG capsule, Take 20 mg by mouth daily., Disp: , Rfl:  .  VYVANSE 50 MG capsule, Take 50 mg by mouth daily., Disp: , Rfl:   No Known Allergies     Review of Systems Constitutional: -fever, -chills, -sweats, -unexpected weight change, -decreased appetite, +chronic fatigue Allergy: -sneezing, -itching, -congestion Dermatology: -changing moles, -rash, -lumps ENT: -runny nose, -ear pain, -sore throat, -hoarseness, -sinus pain, -teeth pain, - ringing in ears, -hearing loss, -nosebleeds Cardiology: -chest pain, -palpitations, -swelling, -difficulty breathing when lying flat, -waking up short of breath Respiratory: -cough, -shortness of breath, -difficulty  breathing with exercise or exertion, -wheezing, -coughing up blood Gastroenterology: -abdominal pain, -nausea, -vomiting, -diarrhea, -constipation, -blood in stool, -changes in bowel movement, -difficulty swallowing or eating Hematology: -bleeding, -bruising  Musculoskeletal: -joint aches, -muscle aches, -joint swelling, -back pain, -neck pain, -cramping, -changes in gait Ophthalmology: denies vision changes, eye redness, itching, discharge Urology: -burning with urination, -difficulty urinating, -blood in urine, -urinary frequency, -urgency, -incontinence Neurology: -headache, -weakness, -tingling, -numbness, -memory loss, -falls, -dizziness Psychology: -agitation, -sleep problems Male GU: no testicular mass, pain, no lymph nodes swollen, no swelling, no rash.     Objective:  BP (!) 144/92   Pulse 95   Ht 5\' 8"  (1.727 m)   Wt (!) 319 lb 12.8 oz (145.1 kg)   SpO2 97%   BMI 48.63 kg/m   General appearance: alert, no distress, WD/WN, African American male Skin:  no worrisome skin lesions HEENT: normocephalic, conjunctiva/corneas normal, sclerae anicteric, PERRLA, EOMi, nares patent, no discharge or erythema, pharynx normal Oral cavity: MMM, tongue normal, teeth normal Neck: supple, no lymphadenopathy, no thyromegaly, no masses, normal ROM, no bruits Chest: non tender, normal shape and expansion Heart: RRR, normal S1, S2, no murmurs Lungs: CTA bilaterally, no wheezes, rhonchi, or rales Abdomen: +bs, soft, non tender, non distended, no masses, no hepatomegaly, no splenomegaly, no bruits Back: non tender, normal ROM, no scoliosis Musculoskeletal: upper extremities non tender, no obvious deformity, normal ROM throughout, lower extremities non tender, no obvious deformity, normal ROM throughout Extremities: no edema, no cyanosis, no clubbing Pulses: 2+ symmetric, upper and lower extremities, normal cap refill Neurological: alert, oriented x 3, CN2-12 intact, strength normal upper  extremities and lower extremities, sensation normal throughout, DTRs 2+ throughout, no cerebellar signs, gait normal Psychiatric: pleasant  GU: normal male external genitalia, nontender, no masses, no hernia, no lymphadenopathy Rectal: deferred  Diabetic Foot Exam - Simple   Simple Foot Form Diabetic Foot exam was performed with the following findings: Yes 10/15/2020  2:45 PM  Visual Inspection See comments: Yes Sensation Testing Intact to touch and monofilament testing bilaterally: Yes Pulse Check Posterior Tibialis and Dorsalis pulse intact bilaterally: Yes Comments Left toenails elongated but otherwise no worrisome lesions    EKG Indication physical and hypertension and diabetes, rate 89 bpm, PR 170 ms, QRS 84 ms, QTC 445 ms, axis 63 degrees, normal sinus rhythm, no acute change    Assessment and Plan :   Encounter Diagnoses  Name Primary?  . Encounter for health maintenance examination in adult Yes  . Essential hypertension   . OSA on CPAP   . Fatty liver   . Diabetes mellitus type 2 in obese (HCC)   . Hyperlipidemia associated with type 2 diabetes mellitus (HCC)   . Hypogonadism in male   .  Vaccine counseling   . Morbid obesity (HCC)   . Low testosterone   . Encounter for hepatitis C screening test for low risk patient   . High risk medication use     Physical exam - discussed and counseled on healthy lifestyle, diet, exercise, preventative care, vaccinations, sick and well care, proper use of emergency dept and after hours care, and addressed their concerns.    Today you had a preventative care visit or wellness visit.    Topics today may have included healthy lifestyle, diet, exercise, preventative care, vaccinations, sick and well care, proper use of emergency dept and after hours care, as well as other concerns.     Recommendations: Continue to return yearly for your annual wellness and preventative care visits.  This gives [place] a chance to discuss healthy  lifestyle, exercise, vaccinations, review your chart record, and perform screenings where appropriate.  I recommend you see your eye doctor yearly for routine vision care.  I recommend you see your dentist yearly for routine dental care including hygiene visits twice yearly.   Vaccination recommendations were reviewed Up-to-date on tetanus diphtheria pertussis booster 2018 Up-to-date on pneumococcal 23 vaccine Up-to-date on Covid vaccine Recommended yearly flu shot He declines flu shot  Screening for cancer: Colon cancer screening:  Advised age 29yo  Cancer screening You should do a monthly self testicular exam  We discussed PSA, prostate exam, and prostate cancer screening risks/benefits.   Age 73yo  Skin cancer screening: Check your skin regularly for new changes, growing lesions, or other lesions of concern Come in for evaluation if you have skin lesions of concern.  Lung cancer screening: If you have a greater than 30 pack year history of tobacco use, then you qualify for lung cancer screening with a chest CT scan  We currently don't have screenings for other cancers besides breast, cervical, colon, and lung cancers.  If you have a strong family history of cancer or have other cancer screening concerns, please let me know.    Bone health: Get at least 150 minutes of aerobic exercise weekly Get weight bearing exercise at least once weekly   Heart health: Get at least 150 minutes of aerobic exercise weekly Limit alcohol It is important to maintain a healthy blood pressure and healthy cholesterol numbers   Separate significant issues discussed: Diabetes - labs today, recommended he have more frequent follow-up than just once yearly.  Currently on Farxiga 5 mg daily  HTN -not at goal.  Currently on verapamil 80 mg 3 times daily, spironolactone 50 mg daily, benazepril 10 mg daily.  Pending labs will modify medication  Hyperlipidemia-continue statin  Fatty liver -  advised need to lose weight through healthy diet, exercise which he is not currently doing  ADD-on Vyvanse through [place] attention specialist   Breion was seen today for annual exam.  Diagnoses and all orders for this visit:  Encounter for health maintenance examination in adult -     Comprehensive metabolic panel -     CBC -     Lipid panel -     Hemoglobin A1c -     Microalbumin/Creatinine Ratio, Urine -     POCT Urinalysis DIP (Proadvantage Device) -     EKG 12-Lead -     Hepatitis C antibody -     HIV Antibody (routine testing w rflx)  Essential hypertension -     Comprehensive metabolic panel -     POCT Urinalysis DIP (Proadvantage Device)  OSA on  CPAP  Fatty liver  Diabetes mellitus type 2 in obese (HCC) -     Hemoglobin A1c -     Microalbumin/Creatinine Ratio, Urine  Hyperlipidemia associated with type 2 diabetes mellitus (HCC) -     Lipid panel  Hypogonadism in male  Vaccine counseling  Morbid obesity (HCC)  Low testosterone  Encounter for hepatitis C screening test for low risk patient -     Hepatitis C antibody  High risk medication use   Follow-up pending labs, yearly for physical

## 2020-10-16 ENCOUNTER — Other Ambulatory Visit: Payer: Self-pay | Admitting: Medical

## 2020-10-16 DIAGNOSIS — Z79899 Other long term (current) drug therapy: Secondary | ICD-10-CM

## 2020-10-16 DIAGNOSIS — K760 Fatty (change of) liver, not elsewhere classified: Secondary | ICD-10-CM

## 2020-10-16 DIAGNOSIS — D696 Thrombocytopenia, unspecified: Secondary | ICD-10-CM

## 2020-10-16 LAB — LIPID PANEL
Chol/HDL Ratio: 3.4 ratio (ref 0.0–5.0)
Cholesterol, Total: 127 mg/dL (ref 100–199)
HDL: 37 mg/dL — ABNORMAL LOW (ref >39)
LDL Chol Calc (NIH): 70 mg/dL (ref 0–99)
Triglycerides: 107 mg/dL (ref 0–149)
VLDL Cholesterol Cal: 20 mg/dL (ref 5–40)

## 2020-10-16 LAB — CBC
Hematocrit: 42.5 % (ref 37.5–51.0)
Hemoglobin: 14.6 g/dL (ref 13.0–17.7)
MCH: 30 pg (ref 26.6–33.0)
MCHC: 34.4 g/dL (ref 31.5–35.7)
MCV: 87 fL (ref 79–97)
Platelets: 129 10*3/uL — ABNORMAL LOW (ref 150–450)
RBC: 4.86 x10E6/uL (ref 4.14–5.80)
RDW: 13.8 % (ref 11.6–15.4)
WBC: 3.9 10*3/uL (ref 3.4–10.8)

## 2020-10-16 LAB — COMPREHENSIVE METABOLIC PANEL
ALT: 24 IU/L (ref 0–44)
AST: 15 IU/L (ref 0–40)
Albumin/Globulin Ratio: 1.5 (ref 1.2–2.2)
Albumin: 4.3 g/dL (ref 4.0–5.0)
Alkaline Phosphatase: 63 IU/L (ref 44–121)
BUN/Creatinine Ratio: 14 (ref 9–20)
BUN: 16 mg/dL (ref 6–24)
Bilirubin Total: 1 mg/dL (ref 0.0–1.2)
CO2: 22 mmol/L (ref 20–29)
Calcium: 9.1 mg/dL (ref 8.7–10.2)
Chloride: 104 mmol/L (ref 96–106)
Creatinine, Ser: 1.16 mg/dL (ref 0.76–1.27)
GFR calc Af Amer: 89 mL/min/{1.73_m2} (ref >59)
GFR calc non Af Amer: 77 mL/min/{1.73_m2} (ref >59)
Globulin, Total: 2.9 g/dL (ref 1.5–4.5)
Glucose: 94 mg/dL (ref 65–99)
Potassium: 3.8 mmol/L (ref 3.5–5.2)
Sodium: 140 mmol/L (ref 134–144)
Total Protein: 7.2 g/dL (ref 6.0–8.5)

## 2020-10-16 LAB — HEMOGLOBIN A1C
Est. average glucose Bld gHb Est-mCnc: 117 mg/dL
Hgb A1c MFr Bld: 5.7 % — ABNORMAL HIGH (ref 4.8–5.6)

## 2020-10-16 LAB — MICROALBUMIN / CREATININE URINE RATIO
Creatinine, Urine: 307.3 mg/dL
Microalb/Creat Ratio: 38 mg/g creat — ABNORMAL HIGH (ref 0–29)
Microalbumin, Urine: 118 ug/mL

## 2020-10-16 LAB — HIV ANTIBODY (ROUTINE TESTING W REFLEX): HIV Screen 4th Generation wRfx: NONREACTIVE

## 2020-10-16 LAB — HEPATITIS C ANTIBODY: Hep C Virus Ab: <0.1 s/co ratio (ref 0.0–0.9)

## 2020-10-16 MED ORDER — DAPAGLIFLOZIN PROPANEDIOL 5 MG PO TABS: 5.0000 mg | ORAL_TABLET | Freq: Every day | ORAL | 3 refills | Status: DC

## 2020-10-16 MED ORDER — FREESTYLE LIBRE 14 DAY SENSOR MISC: 1.0000 | Freq: Every day | 11 refills | Status: DC

## 2020-10-16 MED ORDER — BLOOD PRESSURE KIT: 1.0000 | PACK | Freq: Every day | 0 refills | Status: AC

## 2020-10-18 ENCOUNTER — Other Ambulatory Visit: Payer: Self-pay

## 2020-10-18 DIAGNOSIS — E669 Obesity, unspecified: Secondary | ICD-10-CM

## 2020-10-23 DIAGNOSIS — G4733 Obstructive sleep apnea (adult) (pediatric): Secondary | ICD-10-CM | POA: Diagnosis not present

## 2020-12-19 ENCOUNTER — Other Ambulatory Visit: Payer: Self-pay | Admitting: Medical

## 2021-01-08 DIAGNOSIS — U071 COVID-19: Secondary | ICD-10-CM | POA: Diagnosis not present

## 2021-01-08 DIAGNOSIS — Z20822 Contact with and (suspected) exposure to covid-19: Secondary | ICD-10-CM | POA: Diagnosis not present

## 2021-01-21 DIAGNOSIS — G4733 Obstructive sleep apnea (adult) (pediatric): Secondary | ICD-10-CM | POA: Diagnosis not present

## 2021-02-26 ENCOUNTER — Other Ambulatory Visit: Payer: Self-pay

## 2021-02-26 ENCOUNTER — Telehealth: Payer: Self-pay | Admitting: Medical

## 2021-02-26 MED ORDER — DAPAGLIFLOZIN PROPANEDIOL 5 MG PO TABS: 5.0000 mg | ORAL_TABLET | Freq: Every day | ORAL | 2 refills | Status: DC

## 2021-02-26 NOTE — Telephone Encounter (Signed)
Checking with patient as last refill was sent to local pharmacy for 1 year supply.

## 2021-02-26 NOTE — Telephone Encounter (Signed)
[facility] request for farxiga please send to [facility] - [place], [place] - [address]

## 2021-02-26 NOTE — Telephone Encounter (Signed)
[facility]

## 2021-04-11 ENCOUNTER — Other Ambulatory Visit: Payer: Self-pay | Admitting: Medical

## 2021-04-23 DIAGNOSIS — F902 Attention-deficit hyperactivity disorder, combined type: Secondary | ICD-10-CM | POA: Diagnosis not present

## 2021-04-23 DIAGNOSIS — Z79899 Other long term (current) drug therapy: Secondary | ICD-10-CM | POA: Diagnosis not present

## 2021-04-24 DIAGNOSIS — G4733 Obstructive sleep apnea (adult) (pediatric): Secondary | ICD-10-CM | POA: Diagnosis not present

## 2021-05-07 ENCOUNTER — Telehealth: Payer: Self-pay | Admitting: Medical

## 2021-05-07 NOTE — Telephone Encounter (Signed)
Call to inquire about his last eye exam.  We need to document his last retinal eye exam from eye specialist.  We do not have one on file within the last year  He is also due for 66-month med check fasting

## 2021-05-07 NOTE — Telephone Encounter (Signed)
I called and left message for pt as well as sending mychart message

## 2021-05-08 ENCOUNTER — Telehealth: Payer: Self-pay | Admitting: Medical

## 2021-05-08 NOTE — Telephone Encounter (Signed)
Pt called and made a med check appt and advised that he sees [facility]. I will request most recent eye exam.

## 2021-05-08 NOTE — Telephone Encounter (Signed)
note

## 2021-06-04 ENCOUNTER — Other Ambulatory Visit: Payer: Self-pay | Admitting: Medical

## 2021-06-12 ENCOUNTER — Encounter: Payer: Self-pay | Admitting: Internal Medicine

## 2021-06-20 ENCOUNTER — Encounter: Payer: BC Managed Care – PPO | Admitting: Medical

## 2021-06-24 ENCOUNTER — Encounter: Payer: Self-pay | Admitting: Medical

## 2021-07-24 DIAGNOSIS — G4733 Obstructive sleep apnea (adult) (pediatric): Secondary | ICD-10-CM | POA: Diagnosis not present

## 2021-08-16 ENCOUNTER — Other Ambulatory Visit: Payer: Self-pay | Admitting: Medical

## 2021-08-23 DIAGNOSIS — G4733 Obstructive sleep apnea (adult) (pediatric): Secondary | ICD-10-CM | POA: Diagnosis not present

## 2021-09-23 DIAGNOSIS — G4733 Obstructive sleep apnea (adult) (pediatric): Secondary | ICD-10-CM | POA: Diagnosis not present

## 2021-10-17 ENCOUNTER — Other Ambulatory Visit: Payer: Self-pay

## 2021-10-17 ENCOUNTER — Encounter: Payer: Self-pay | Admitting: Medical

## 2021-10-17 ENCOUNTER — Ambulatory Visit (INDEPENDENT_AMBULATORY_CARE_PROVIDER_SITE_OTHER): Payer: BC Managed Care – PPO | Admitting: Medical

## 2021-10-17 VITALS — BP 138/88 | HR 85 | Ht 69.0 in | Wt 322.6 lb

## 2021-10-17 DIAGNOSIS — Z0000 Encounter for general adult medical examination without abnormal findings: Secondary | ICD-10-CM | POA: Diagnosis not present

## 2021-10-17 DIAGNOSIS — E669 Obesity, unspecified: Secondary | ICD-10-CM | POA: Diagnosis not present

## 2021-10-17 DIAGNOSIS — E785 Hyperlipidemia, unspecified: Secondary | ICD-10-CM

## 2021-10-17 DIAGNOSIS — L301 Dyshidrosis [pompholyx]: Secondary | ICD-10-CM

## 2021-10-17 DIAGNOSIS — E1169 Type 2 diabetes mellitus with other specified complication: Secondary | ICD-10-CM | POA: Diagnosis not present

## 2021-10-17 DIAGNOSIS — G43909 Migraine, unspecified, not intractable, without status migrainosus: Secondary | ICD-10-CM

## 2021-10-17 DIAGNOSIS — Z23 Encounter for immunization: Secondary | ICD-10-CM

## 2021-10-17 DIAGNOSIS — E291 Testicular hypofunction: Secondary | ICD-10-CM

## 2021-10-17 DIAGNOSIS — Z7185 Encounter for immunization safety counseling: Secondary | ICD-10-CM

## 2021-10-17 DIAGNOSIS — R7989 Other specified abnormal findings of blood chemistry: Secondary | ICD-10-CM

## 2021-10-17 DIAGNOSIS — G44009 Cluster headache syndrome, unspecified, not intractable: Secondary | ICD-10-CM

## 2021-10-17 DIAGNOSIS — G4733 Obstructive sleep apnea (adult) (pediatric): Secondary | ICD-10-CM

## 2021-10-17 LAB — POCT GLYCOSYLATED HEMOGLOBIN (HGB A1C): Hemoglobin A1C: 5.6 % (ref 4.0–5.6)

## 2021-10-17 NOTE — Progress Notes (Signed)
Subjective:   HPI  [name] is a 45 y.o. male who presents for Chief Complaint  Patient presents with   fasting cpe.     Fasitng cpe, no concerns    Medical team: Akelia Husted, Camelia Eng, PA-C here for primary care Sees dentist Sees eye doctor Dr. Izora Gala, ENT Dr. Irine Seal, Urology [facility] for psychiatry/mental health  Of note last visit a year ago although we have advised several times in the past that he should be coming in more than once a year for diabetes follow-up   Concerns: Here for yearly physical  Diabetes -he reports compliance with Farxiga 18m.  Checks glucose sometimes, but not keeping up that well with it.     Hyperlipidemia - reports compliance with Crestor 20  HTN -he reports compliance with benazepril 118m ran out of verapamil.  Is taking this BID, not 3 times daily, forgets.   At 1 point he was on spironolactone but not on this now.  Not checking BPs.    Attention deficit-sees mental health clinic for this, on Vyvanse daily.   Sees clinic every 3 months.    OSA - using CPAP  Exericse - not a lot.    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    No recent issues with migraines.  Reviewed their medical, surgical, family, social, medication, and allergy history and updated chart as appropriate.  Past Medical History:  Diagnosis Date   Hypertension    Migraine    Nasal congestion 10/03/2014   [place]   Obesity    Sleep apnea    Wears contact lenses     Past Surgical History:  Procedure Laterality Date   CHOLECYSTECTOMY N/A 10/09/2014   Procedure: LAPAROSCOPIC CHOLECYSTECTOMY;  Surgeon: DoCoralie KeensMD;  [facility] Service: General;  Laterality: N/A;    Social History   Socioeconomic History   Marital status: Married    Spouse name: Not on file   Number of children: 1   Years of education: Not on file   Highest education level: Some  college, no degree  Occupational History   Not on file  Tobacco Use   Smoking status: Never   Smokeless tobacco: Never  Vaping Use   Vaping Use: Never used  Substance and Sexual Activity   Alcohol use: Yes    Comment: occasional   Drug use: No   Sexual activity: Yes  Other Topics Concern   Not on file  Social History Narrative   Lives at home with wife & 3 children and a dog.    Married.  Wife has 2 children, so 3 in the household.  Exercise some with walking.  Works as teFinancial traderor [employer]    Drinks 1-2 cups of caffeine daily   Right handed   10/2021       Social Determinants of Health   Financial Resource Strain: Not on file  Food Insecurity: Not on file  Transportation Needs: Not on file  Physical Activity: Not on file  Stress: Not on file  Social Connections: Not on file  Intimate Partner Violence: Not on file    Family History  Problem Relation Age of Onset   Hypertension Mother    Kidney disease Father    Diabetes Maternal Aunt    Diabetes Paternal Aunt    Cancer Maternal Grandfather        throat   Stroke Paternal Grandmother  Alcohol abuse Paternal Grandmother    Heart disease Neg Hx      Current Outpatient Medications:    benazepril (LOTENSIN) 10 MG tablet, TAKE 1 TABLET DAILY (NEED APPOINTMENT), Disp: 30 tablet, Rfl: 1   dapagliflozin propanediol (FARXIGA) 5 MG TABS tablet, Take 1 tablet (5 mg total) by mouth daily., Disp: 90 tablet, Rfl: 2   rosuvastatin (CRESTOR) 20 MG tablet, TAKE 1 TABLET AT BEDTIME, Disp: 90 tablet, Rfl: 0   verapamil (CALAN) 80 MG tablet, TAKE 1 TABLET THREE TIMES A DAY (NEED APPOINTMENT), Disp: 30 tablet, Rfl: 1   VYVANSE 20 MG capsule, Take 20 mg by mouth daily., Disp: , Rfl:    VYVANSE 50 MG capsule, Take 50 mg by mouth daily., Disp: , Rfl:    Blood Pressure KIT, 1 each by Does not apply route daily., Disp: 1 kit, Rfl: 0  No Known Allergies    Review of Systems Constitutional: -fever, -chills, -sweats,  -unexpected weight change, -decreased appetite Allergy: -sneezing, -itching, -congestion Dermatology: -changing moles, -rash, -lumps ENT: -runny nose, -ear pain, -sore throat, -hoarseness, -sinus pain, -teeth pain, - ringing in ears, -hearing loss, -nosebleeds Cardiology: -chest pain, -palpitations, -swelling, -difficulty breathing when lying flat, -waking up short of breath Respiratory: -cough, -shortness of breath, -difficulty breathing with exercise or exertion, -wheezing, -coughing up blood Gastroenterology: -abdominal pain, -nausea, -vomiting, -diarrhea, -constipation, -blood in stool, -changes in bowel movement, -difficulty swallowing or eating Hematology: -bleeding, -bruising  Musculoskeletal: -joint aches, -muscle aches, -joint swelling, -back pain, -neck pain, -cramping, -changes in gait Ophthalmology: denies vision changes, eye redness, itching, discharge Urology: -burning with urination, -difficulty urinating, -blood in urine, -urinary frequency, -urgency, -incontinence Neurology: -headache, -weakness, -tingling, -numbness, -memory loss, -falls, -dizziness Psychology: -agitation, -sleep problems Male GU: no testicular mass, pain, no lymph nodes swollen, no swelling, no rash.     Objective:  BP 138/88    Pulse 85    Ht '5\' 9"'  (1.753 m)    Wt (!) 322 lb 9.6 oz (146.3 kg)    BMI 47.64 kg/m   Wt Readings from Last 3 Encounters:  10/17/21 (!) 322 lb 9.6 oz (146.3 kg)  10/15/20 (!) 319 lb 12.8 oz (145.1 kg)  10/07/19 (!) 318 lb 9.6 oz (144.5 kg)   BP Readings from Last 3 Encounters:  10/17/21 138/88  10/15/20 (!) 144/92  10/07/19 (!) 148/88    General appearance: alert, no distress, WD/WN, African American male Skin:  no worrisome skin lesions HEENT: normocephalic, conjunctiva/corneas normal, sclerae anicteric, PERRLA, EOMi Neck: supple, no lymphadenopathy, no thyromegaly, no masses, normal ROM, no bruits Chest: non tender, normal shape and expansion Heart: RRR, normal S1, S2,  no murmurs Lungs: CTA bilaterally, no wheezes, rhonchi, or rales Abdomen: +bs, soft, non tender, non distended, no masses, no hepatomegaly, no splenomegaly, no bruits Back: non tender, normal ROM, no scoliosis Musculoskeletal: upper extremities non tender, no obvious deformity, normal ROM throughout, lower extremities non tender, no obvious deformity, normal ROM throughout Extremities: no edema, no cyanosis, no clubbing Pulses: 2+ symmetric, upper and lower extremities, normal cap refill Neurological: alert, oriented x 3, CN2-12 intact, strength normal upper extremities and lower extremities, sensation normal throughout, DTRs 2+ throughout, no cerebellar signs, gait normal Psychiatric: pleasant  GU: deferred Rectal: deferred  Diabetic Foot Exam - Simple   Simple Foot Form Diabetic Foot exam was performed with the following findings: Yes 10/17/2021  2:38 PM  Visual Inspection No deformities, no ulcerations, no other skin breakdown bilaterally: Yes Sensation Testing Intact to touch  and monofilament testing bilaterally: Yes Pulse Check Posterior Tibialis and Dorsalis pulse intact bilaterally: Yes Comments       Assessment and Plan :   Encounter Diagnoses  Name Primary?   Encounter for health maintenance examination in adult Yes   Vaccine counseling    Hypogonadism in male    Hyperlipidemia associated with type 2 diabetes mellitus ([place])    Diabetes mellitus type 2 in obese ([place])    Cluster headache, not intractable, unspecified chronicity pattern    Dyshidrotic hand dermatitis    Low testosterone    Morbid obesity (HCC)    Migraine without status migrainosus, not intractable, unspecified migraine type    OSA (obstructive sleep apnea)    Need for influenza vaccination     Physical exam - discussed and counseled on healthy lifestyle, diet, exercise, preventative care, vaccinations, sick and well care, proper use of emergency dept and after hours care, and addressed their  concerns.    Today you had a preventative care visit or wellness visit.    Topics today may have included healthy lifestyle, diet, exercise, preventative care, vaccinations, sick and well care, proper use of emergency dept and after hours care, as well as other concerns.     Recommendations: Continue to return yearly for your annual wellness and preventative care visits.  This gives [place] a chance to discuss healthy lifestyle, exercise, vaccinations, review your chart record, and perform screenings where appropriate.  I recommend you see your eye doctor yearly for routine vision care.  I recommend you see your dentist yearly for routine dental care including hygiene visits twice yearly.   Vaccination recommendations were reviewed Immunization History  Administered Date(s) Administered   Influenza,inj,Quad PF,6+ Mos 07/05/2015, 08/19/2016, 07/29/2019   PFIZER(Purple Top)SARS-COV-2 Vaccination 03/29/2020, 04/19/2020   Pneumococcal Polysaccharide-23 05/25/2017   Tdap 05/25/2017   Counseled on the influenza virus vaccine.  Vaccine information sheet given.  Influenza vaccine given after consent obtained.   Screening for cancer: Colon cancer screening:  Advised age 89yo  Cancer screening You should do a monthly self testicular exam  We discussed PSA, prostate exam, and prostate cancer screening risks/benefits.   Age 74yo  Skin cancer screening: Check your skin regularly for new changes, growing lesions, or other lesions of concern Come in for evaluation if you have skin lesions of concern.  Lung cancer screening: If you have a greater than 30 pack year history of tobacco use, then you qualify for lung cancer screening with a chest CT scan  We currently don't have screenings for other cancers besides breast, cervical, colon, and lung cancers.  If you have a strong family history of cancer or have other cancer screening concerns, please let me know.    Bone health: Get at least 150  minutes of aerobic exercise weekly Get weight bearing exercise at least once weekly   Heart health: Get at least 150 minutes of aerobic exercise weekly Limit alcohol It is important to maintain a healthy blood pressure and healthy cholesterol numbers   Separate significant issues discussed: Diabetes -needs to come in more frequently once per year.  Continue current medications.  Labs today.  Referral to Pharm[employer] drug study  HTN -BP not quite at goal.  Pending labs consider modification of regimen  Hyperlipidemia-labs today, continue Crestor  Fatty liver  This diagnosis can be confirmed by liver biopsy, however the assumption based on the liver test and clinical evaluation supports the diagnosis of fatty liver disease.  This is a serious long-term  diagnosis as the liver can become scarred and cirrhotic.  This can lead to problems with blood clotting, decreased platelets, and other numerous problems over time.  The recommendation is to eat a healthy low-fat diet, have a regular exercise regimen, work on losing weight, limit alcohol, limit other medications that can harm the liver, and monitor the liver with repeat scan from time to time.   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 .  Diagnoses and all orders for this visit:  Encounter for health maintenance examination in adult -     Comprehensive metabolic panel -     CBC -     Lipid panel -     Microalbumin/Creatinine Ratio, Urine -     Urinalysis, Routine w reflex microscopic -     HgB A1c  Vaccine counseling  Hypogonadism in male  Hyperlipidemia associated with type 2 diabetes mellitus ([place]) -     Comprehensive metabolic panel -     Lipid panel  Diabetes mellitus  type 2 in obese (HCC) -     Microalbumin/Creatinine Ratio, Urine -     HgB A1c  Cluster headache, not intractable, unspecified chronicity pattern  Dyshidrotic hand dermatitis  Low testosterone  Morbid obesity (HCC)  Migraine without status migrainosus, not intractable, unspecified migraine type  OSA (obstructive sleep apnea)  Need for influenza vaccination  Other orders -     Flu Vaccine QUAD 47moIM (Fluarix, Fluzone & Alfiuria Quad PF)   Follow-up pending labs, yearly for physical

## 2021-10-17 NOTE — Patient Instructions (Addendum)
[facility] Call [name] and ask about the diabetes/weight loss study.  Let her know I asked you to call since you are tied up on the phone most of the day.  Address: [address], [place], [place] [address] [phone]      Physical exam - discussed and counseled on healthy lifestyle, diet, exercise, preventative care, vaccinations, sick and well care, proper use of emergency dept and after hours care, and addressed their concerns.    Today you had a preventative care visit or wellness visit.    Topics today may have included healthy lifestyle, diet, exercise, preventative care, vaccinations, sick and well care, proper use of emergency dept and after hours care, as well as other concerns.     Recommendations: Continue to return yearly for your annual wellness and preventative care visits.  This gives [place] a chance to discuss healthy lifestyle, exercise, vaccinations, review your chart record, and perform screenings where appropriate.  I recommend you see your eye doctor yearly for routine vision care.  I recommend you see your dentist yearly for routine dental care including hygiene visits twice yearly.   Vaccination recommendations were reviewed Immunization History  Administered Date(s) Administered   Influenza,inj,Quad PF,6+ Mos 07/05/2015, 08/19/2016, 07/29/2019   PFIZER(Purple Top)SARS-COV-2 Vaccination 03/29/2020, 04/19/2020   Pneumococcal Polysaccharide-23 05/25/2017   Tdap 05/25/2017   Counseled on the influenza virus vaccine.  Vaccine information sheet given.  Influenza vaccine given after consent obtained.   Screening for cancer: Colon cancer screening:  Advised age 45yo  Cancer screening You should do a monthly self testicular exam  We discussed PSA, prostate exam, and prostate cancer screening risks/benefits.   Age 45yo  Skin cancer screening: Check your skin regularly for new changes, growing lesions, or other lesions of concern Come in for evaluation  if you have skin lesions of concern.  Lung cancer screening: If you have a greater than 30 pack year history of tobacco use, then you qualify for lung cancer screening with a chest CT scan  We currently don't have screenings for other cancers besides breast, cervical, colon, and lung cancers.  If you have a strong family history of cancer or have other cancer screening concerns, please let me know.    Bone health: Get at least 150 minutes of aerobic exercise weekly Get weight bearing exercise at least once weekly   Heart health: Get at least 150 minutes of aerobic exercise weekly Limit alcohol It is important to maintain a healthy blood pressure and healthy cholesterol numbers   Separate significant issues discussed: Diabetes -needs to come in more frequently once per year.  Continue current medications.  Labs today.  Referral to [facility] drug study  HTN -BP not quite at goal.  Pending labs consider modification of regimen  Hyperlipidemia-labs today, continue Crestor  Fatty liver  This diagnosis can be confirmed by liver biopsy, however the assumption based on the liver test and clinical evaluation supports the diagnosis of fatty liver disease.  This is a serious long-term diagnosis as the liver can become scarred and cirrhotic.  This can lead to problems with blood clotting, decreased platelets, and other numerous problems over time.  The recommendation is to eat a healthy low-fat diet, have a regular exercise regimen, work on losing weight, limit alcohol, limit other medications that can harm the liver, and monitor the liver with repeat scan from time to time.   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

## 2021-10-18 ENCOUNTER — Other Ambulatory Visit: Payer: Self-pay | Admitting: Medical

## 2021-10-18 DIAGNOSIS — R809 Proteinuria, unspecified: Secondary | ICD-10-CM

## 2021-10-18 DIAGNOSIS — I10 Essential (primary) hypertension: Secondary | ICD-10-CM

## 2021-10-18 DIAGNOSIS — D696 Thrombocytopenia, unspecified: Secondary | ICD-10-CM

## 2021-10-18 LAB — URINALYSIS, ROUTINE W REFLEX MICROSCOPIC
Bilirubin, UA: NEGATIVE
Glucose, UA: NEGATIVE
Ketones, UA: NEGATIVE
Leukocytes,UA: NEGATIVE
Nitrite, UA: NEGATIVE
Specific Gravity, UA: 1.019 (ref 1.005–1.030)
Urobilinogen, Ur: 1 mg/dL (ref 0.2–1.0)
pH, UA: 6.5 (ref 5.0–7.5)

## 2021-10-18 LAB — COMPREHENSIVE METABOLIC PANEL
ALT: 20 IU/L (ref 0–44)
AST: 14 IU/L (ref 0–40)
Albumin/Globulin Ratio: 2.2 (ref 1.2–2.2)
Albumin: 4.6 g/dL (ref 4.0–5.0)
Alkaline Phosphatase: 58 IU/L (ref 44–121)
BUN/Creatinine Ratio: 14 (ref 9–20)
BUN: 16 mg/dL (ref 6–24)
Bilirubin Total: 1.2 mg/dL (ref 0.0–1.2)
CO2: 27 mmol/L (ref 20–29)
Calcium: 8.6 mg/dL — ABNORMAL LOW (ref 8.7–10.2)
Chloride: 99 mmol/L (ref 96–106)
Creatinine, Ser: 1.15 mg/dL (ref 0.76–1.27)
Globulin, Total: 2.1 g/dL (ref 1.5–4.5)
Glucose: 93 mg/dL (ref 70–99)
Potassium: 2.9 mmol/L — ABNORMAL LOW (ref 3.5–5.2)
Sodium: 142 mmol/L (ref 134–144)
Total Protein: 6.7 g/dL (ref 6.0–8.5)
eGFR: 81 mL/min/{1.73_m2} (ref >59)

## 2021-10-18 LAB — LIPID PANEL
Chol/HDL Ratio: 4.7 ratio (ref 0.0–5.0)
Cholesterol, Total: 169 mg/dL (ref 100–199)
HDL: 36 mg/dL — ABNORMAL LOW (ref >39)
LDL Chol Calc (NIH): 110 mg/dL — ABNORMAL HIGH (ref 0–99)
Triglycerides: 126 mg/dL (ref 0–149)
VLDL Cholesterol Cal: 23 mg/dL (ref 5–40)

## 2021-10-18 LAB — MICROSCOPIC EXAMINATION
Bacteria, UA: NONE SEEN
Casts: NONE SEEN /lpf
Epithelial Cells (non renal): NONE SEEN /hpf (ref 0–10)
WBC, UA: NONE SEEN /hpf (ref 0–5)

## 2021-10-18 LAB — CBC
Hematocrit: 40.9 % (ref 37.5–51.0)
Hemoglobin: 14.1 g/dL (ref 13.0–17.7)
MCH: 29 pg (ref 26.6–33.0)
MCHC: 34.5 g/dL (ref 31.5–35.7)
MCV: 84 fL (ref 79–97)
Platelets: 124 10*3/uL — ABNORMAL LOW (ref 150–450)
RBC: 4.86 x10E6/uL (ref 4.14–5.80)
RDW: 13.6 % (ref 11.6–15.4)
WBC: 4.3 10*3/uL (ref 3.4–10.8)

## 2021-10-18 LAB — MICROALBUMIN / CREATININE URINE RATIO
Creatinine, Urine: 150 mg/dL
Microalb/Creat Ratio: 207 mg/g creat — ABNORMAL HIGH (ref 0–29)
Microalbumin, Urine: 310.8 ug/mL

## 2021-10-18 MED ORDER — ROSUVASTATIN CALCIUM 20 MG PO TABS: 20.0000 mg | ORAL_TABLET | Freq: Every day | ORAL | 3 refills | Status: DC

## 2021-10-18 MED ORDER — VALSARTAN 160 MG PO TABS: 160.0000 mg | ORAL_TABLET | Freq: Every day | ORAL | 0 refills | Status: DC

## 2021-10-18 MED ORDER — DAPAGLIFLOZIN PROPANEDIOL 5 MG PO TABS: 5.0000 mg | ORAL_TABLET | Freq: Every day | ORAL | 1 refills | Status: DC

## 2021-10-18 MED ORDER — VERAPAMIL HCL 80 MG PO TABS: ORAL_TABLET | ORAL | 0 refills | Status: DC

## 2021-10-19 ENCOUNTER — Telehealth: Payer: Self-pay

## 2021-10-19 NOTE — Telephone Encounter (Signed)
P.A. FARXIGA renewal

## 2021-10-21 ENCOUNTER — Other Ambulatory Visit: Payer: Self-pay | Admitting: Medical

## 2021-10-21 MED ORDER — POTASSIUM CHLORIDE ER 10 MEQ PO TBCR: 10.0000 meq | EXTENDED_RELEASE_TABLET | Freq: Every day | ORAL | 0 refills | Status: DC

## 2021-10-21 MED ORDER — SILDENAFIL CITRATE 20 MG PO TABS: 60.0000 mg | ORAL_TABLET | Freq: Every day | ORAL | 1 refills | Status: DC | PRN

## 2021-10-22 DIAGNOSIS — G4733 Obstructive sleep apnea (adult) (pediatric): Secondary | ICD-10-CM | POA: Diagnosis not present

## 2021-10-23 DIAGNOSIS — G4733 Obstructive sleep apnea (adult) (pediatric): Secondary | ICD-10-CM | POA: Diagnosis not present

## 2021-10-25 ENCOUNTER — Telehealth: Payer: Self-pay

## 2021-10-25 NOTE — Telephone Encounter (Signed)
P.A. SILDENAFIL  

## 2021-10-25 NOTE — Telephone Encounter (Signed)
P.A. approved til 10/22/22, sent mychart message  

## 2021-10-29 NOTE — Telephone Encounter (Signed)
Additional questions answered for P.A.

## 2021-11-22 DIAGNOSIS — G4733 Obstructive sleep apnea (adult) (pediatric): Secondary | ICD-10-CM | POA: Diagnosis not present

## 2021-11-25 DIAGNOSIS — M7711 Lateral epicondylitis, right elbow: Secondary | ICD-10-CM | POA: Diagnosis not present

## 2021-11-29 ENCOUNTER — Other Ambulatory Visit: Payer: Self-pay | Admitting: Medical

## 2021-11-29 MED ORDER — SILDENAFIL CITRATE 20 MG PO TABS: 60.0000 mg | ORAL_TABLET | Freq: Every day | ORAL | 2 refills | Status: DC | PRN

## 2021-12-06 ENCOUNTER — Telehealth: Payer: Self-pay

## 2021-12-06 NOTE — Telephone Encounter (Signed)
Called [facility] and pt no longer covered.  Called pharmacy and insurance still not paying for Sildenafil.  Pt used discount card and still paid $122 for #45 tablets.  Can switch to [facility] or [facility] with [facility] and will be lot cheaper $10 first month then around $20 thereafter.  Left message for pt

## 2021-12-06 NOTE — Telephone Encounter (Signed)
Called pharmacy and insurance still not paying for Sildenafil.  Pt used discount card and still paid $122 for #45 tablets.  Can switch to [facility] or [facility] with [facility] and will be lot cheaper $10 first month then around $20 thereafter.  Left message for pt

## 2021-12-11 DIAGNOSIS — I129 Hypertensive chronic kidney disease with stage 1 through stage 4 chronic kidney disease, or unspecified chronic kidney disease: Secondary | ICD-10-CM | POA: Diagnosis not present

## 2021-12-11 DIAGNOSIS — E119 Type 2 diabetes mellitus without complications: Secondary | ICD-10-CM | POA: Diagnosis not present

## 2021-12-11 DIAGNOSIS — E785 Hyperlipidemia, unspecified: Secondary | ICD-10-CM | POA: Diagnosis not present

## 2021-12-11 DIAGNOSIS — E876 Hypokalemia: Secondary | ICD-10-CM | POA: Diagnosis not present

## 2021-12-23 DIAGNOSIS — G4733 Obstructive sleep apnea (adult) (pediatric): Secondary | ICD-10-CM | POA: Diagnosis not present

## 2022-01-21 DIAGNOSIS — G4733 Obstructive sleep apnea (adult) (pediatric): Secondary | ICD-10-CM | POA: Diagnosis not present

## 2022-01-29 ENCOUNTER — Other Ambulatory Visit: Payer: Self-pay

## 2022-01-29 MED ORDER — VALSARTAN 160 MG PO TABS: 160.0000 mg | ORAL_TABLET | Freq: Every day | ORAL | 0 refills | Status: DC

## 2022-02-21 DIAGNOSIS — G4733 Obstructive sleep apnea (adult) (pediatric): Secondary | ICD-10-CM | POA: Diagnosis not present

## 2022-02-24 ENCOUNTER — Other Ambulatory Visit: Payer: Self-pay | Admitting: Medical

## 2022-03-01 ENCOUNTER — Telehealth: Payer: Self-pay

## 2022-03-01 NOTE — Telephone Encounter (Signed)
P.A. SILDENAFIL  

## 2022-03-23 DIAGNOSIS — G4733 Obstructive sleep apnea (adult) (pediatric): Secondary | ICD-10-CM | POA: Diagnosis not present

## 2022-03-28 NOTE — Telephone Encounter (Signed)
P.A. denied, plan will not cover this medication, left message for pt to give info on [facility]

## 2022-04-08 ENCOUNTER — Ambulatory Visit: Payer: BC Managed Care – PPO | Admitting: Medical

## 2022-04-12 ENCOUNTER — Ambulatory Visit: Payer: BC Managed Care – PPO

## 2022-04-14 ENCOUNTER — Ambulatory Visit (INDEPENDENT_AMBULATORY_CARE_PROVIDER_SITE_OTHER): Payer: BC Managed Care – PPO | Admitting: Family Medicine

## 2022-04-14 ENCOUNTER — Encounter: Payer: Self-pay | Admitting: Medical

## 2022-04-14 ENCOUNTER — Encounter: Payer: Self-pay | Admitting: Family Medicine

## 2022-04-14 VITALS — BP 170/110 | HR 102 | Temp 97.9°F | Wt 337.8 lb

## 2022-04-14 DIAGNOSIS — F410 Panic disorder [episodic paroxysmal anxiety] without agoraphobia: Secondary | ICD-10-CM | POA: Diagnosis not present

## 2022-04-14 DIAGNOSIS — Z566 Other physical and mental strain related to work: Secondary | ICD-10-CM | POA: Diagnosis not present

## 2022-04-14 MED ORDER — ALPRAZOLAM 0.25 MG PO TABS: 0.2500 mg | ORAL_TABLET | Freq: Three times a day (TID) | ORAL | 0 refills | Status: DC | PRN
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
## 2022-04-15 ENCOUNTER — Ambulatory Visit: Payer: BC Managed Care – PPO | Admitting: Medical

## 2022-04-17 ENCOUNTER — Ambulatory Visit (INDEPENDENT_AMBULATORY_CARE_PROVIDER_SITE_OTHER): Payer: BC Managed Care – PPO | Admitting: Psychology

## 2022-04-17 DIAGNOSIS — F4323 Adjustment disorder with mixed anxiety and depressed mood: Secondary | ICD-10-CM
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
## 2022-04-22 DIAGNOSIS — G4733 Obstructive sleep apnea (adult) (pediatric): Secondary | ICD-10-CM | POA: Diagnosis not present

## 2022-04-24 ENCOUNTER — Ambulatory Visit (INDEPENDENT_AMBULATORY_CARE_PROVIDER_SITE_OTHER): Payer: BC Managed Care – PPO | Admitting: Psychology

## 2022-04-24 DIAGNOSIS — F4323 Adjustment disorder with mixed anxiety and depressed mood: Secondary | ICD-10-CM | POA: Diagnosis not present

## 2022-04-24 DIAGNOSIS — G4733 Obstructive sleep apnea (adult) (pediatric): Secondary | ICD-10-CM | POA: Diagnosis not present
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
## 2022-04-29 ENCOUNTER — Ambulatory Visit: Payer: BC Managed Care – PPO | Admitting: Psychology

## 2022-05-01 ENCOUNTER — Ambulatory Visit (INDEPENDENT_AMBULATORY_CARE_PROVIDER_SITE_OTHER): Payer: BC Managed Care – PPO | Admitting: Psychology

## 2022-05-01 DIAGNOSIS — F4323 Adjustment disorder with mixed anxiety and depressed mood: Secondary | ICD-10-CM | POA: Diagnosis not present
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
## 2022-05-05 ENCOUNTER — Other Ambulatory Visit: Payer: Self-pay | Admitting: Family Medicine

## 2022-05-05 DIAGNOSIS — Z566 Other physical and mental strain related to work: Secondary | ICD-10-CM

## 2022-05-05 DIAGNOSIS — F410 Panic disorder [episodic paroxysmal anxiety] without agoraphobia: Secondary | ICD-10-CM

## 2022-05-05 NOTE — Telephone Encounter (Signed)
SEE MESSAGE

## 2022-05-05 NOTE — Telephone Encounter (Signed)
Left message asking patient to please call back. Asked if he needed this refilled or if it was an auto-refill as it was just prescribed 3 weeks ago.

## 2022-05-09 ENCOUNTER — Ambulatory Visit: Payer: BC Managed Care – PPO | Admitting: Psychology

## 2022-05-13 DIAGNOSIS — F902 Attention-deficit hyperactivity disorder, combined type: Secondary | ICD-10-CM | POA: Diagnosis not present

## 2022-05-13 DIAGNOSIS — Z79899 Other long term (current) drug therapy: Secondary | ICD-10-CM | POA: Diagnosis not present

## 2022-05-20 ENCOUNTER — Other Ambulatory Visit: Payer: Self-pay | Admitting: Medical

## 2022-05-20 MED ORDER — TADALAFIL 20 MG PO TABS: 20.0000 mg | ORAL_TABLET | Freq: Every day | ORAL | 0 refills | Status: DC | PRN

## 2022-05-22 DIAGNOSIS — G4733 Obstructive sleep apnea (adult) (pediatric): Secondary | ICD-10-CM | POA: Diagnosis not present

## 2022-05-23 ENCOUNTER — Telehealth: Payer: Self-pay | Admitting: Medical

## 2022-05-23 ENCOUNTER — Ambulatory Visit (INDEPENDENT_AMBULATORY_CARE_PROVIDER_SITE_OTHER): Payer: BC Managed Care – PPO | Admitting: Psychology

## 2022-05-23 DIAGNOSIS — F4323 Adjustment disorder with mixed anxiety and depressed mood: Secondary | ICD-10-CM

## 2022-05-23 NOTE — Progress Notes (Signed)
[facility] Counselor/Therapist Progress Note  Patient ID: DRAVIN LANCE, MRN: [id number],    Date: 05/23/2022  Time Spent: 45 mins  Treatment Type: Individual Therapy  Reported Symptoms: Pt presents for session via webex video.  Pt grants consent for session, stating he is in his car with no one else present.  I shared with pt that I am in my office at home with no one else here either.  Mental Status Exam: Appearance:  Casual     Behavior: Appropriate  Motor: Normal  Speech/Language:  Clear and Coherent  Affect: Appropriate  Mood: normal  Thought process: normal  Thought content:   WNL  Sensory/Perceptual disturbances:   WNL  Orientation: oriented to person, place, and time/date  Attention: Good  Concentration: Good  Memory: WNL  Fund of knowledge:  Good  Insight:   Good  Judgment:  Good  Impulse Control: Good   Risk Assessment: Danger to Self:  No Self-injurious Behavior: No Danger to Others: No Duty to Warn:no Physical Aggression / Violence:No  Access to Firearms a concern: No  Gang Involvement:No   Subjective: Pt shares that, "I have been doing pretty well since our last session.  I have contacted my ADHD provider to get back on my medication.  I have been off of them for about 6 months and concentrating at work has been difficult."  Pt shares that he still likes where his work station is in the office.  He would like to work from home again, if the company would grant that option.  He shares that the disability company has approved his request for disability for the period of time he was out of work.  They have also sent additional paperwork to cover his absences for our sessions.  He has continued working regularly; he has not missed any days of work since our last session; he was one hr late for work one day.  Pt shares that he has not had any anxious episodes since our last session.  He has been driving regularly and doing all of the regular duties he  has for the family.  He and his wife celebrated their 8th wedding anniversary last week and they took a road trip to [facility] [place].  His daughter continues to grieve the loss of her father in early June.  Pt continues to "sleep pretty good.  I have not using any of the Ambien and I have still slept well most nights."  Pt shares that he still experiences stress in the work place.  Customers often are difficult to work with; "they do not have to make sense with their requests but I have to make sense to them with my answers to their questions.  I don't feel like I have any freedom to be myself at work.  I have never found a position that a really enjoyed."  Pt shares that he has felt this way for a long time and he is looking for other possible positions.  He only works to pay bills instead of feeling like he is contributing to something bigger and better.  Pt has applied for a position within [employer] that would be different and would give him a different perspective.  He has done his current job for most of the past 12 yrs.  He interviewed for the new job last week and is waiting to hear about their decision.  Talked with pt about developing a Gratitude List for the purpose of helping him to better be  able to appreciate the things he has versus focusing on the things he does not have.  We will review his list at our follow up session next week.  Interventions: Cognitive Behavioral Therapy  Diagnosis:Adjustment disorder with mixed anxiety and depressed mood  Plan: Treatment Plan Strengths/Abilities:  Intelligent, Intuitive, Willing to participate in therapy Treatment Preferences:  Outpatient Individual Therapy Statement of Needs:  Patient is to use CBT, mindfulness and coping skills to help manage and/or decrease symptoms associated with their diagnosis. Symptoms:  Depressed/Irritable mood, worry, social withdrawal Problems Addressed:  Depressive thoughts, Sadness, Sleep issues, etc. Long Term Goals:  Pt to reduce  overall level, frequency, and intensity of the feelings of depression/anxiety as evidenced by decreased irritability, negative self talk, and helpless feelings from 6 to 7 days/week to 0 to 1 days/week, per client report, for at least 3 consecutive months.  Progress:  Short Term Goals:  Pt to verbally express understanding of the relationship between feelings of depression/anxiety and their impact on thinking patterns and behaviors.  Pt to verbalize an understanding of the role that distorted thinking plays in creating fears, excessive worry, and ruminations.  Progress:  Target Date:  04/25/2023 Frequency:  Bi-weekly Modality:  Cognitive Behavioral Therapy Interventions by Therapist:  Therapist will use CBT, Mindfulness exercises, Coping skills and Referrals, as needed by client. Client has verbally approved this treatment plan.  Karie Kirks, [facility]

## 2022-05-23 NOTE — Telephone Encounter (Signed)
P.A. TADALAFIL 

## 2022-05-26 ENCOUNTER — Other Ambulatory Visit: Payer: Self-pay | Admitting: Medical

## 2022-05-27 ENCOUNTER — Ambulatory Visit: Payer: BC Managed Care – PPO | Admitting: Psychology

## 2022-05-28 ENCOUNTER — Other Ambulatory Visit: Payer: Self-pay

## 2022-05-28 ENCOUNTER — Other Ambulatory Visit: Payer: Self-pay | Admitting: Medical

## 2022-05-28 ENCOUNTER — Ambulatory Visit (INDEPENDENT_AMBULATORY_CARE_PROVIDER_SITE_OTHER): Payer: BC Managed Care – PPO | Admitting: Psychology

## 2022-05-28 DIAGNOSIS — F4323 Adjustment disorder with mixed anxiety and depressed mood: Secondary | ICD-10-CM | POA: Diagnosis not present

## 2022-05-28 NOTE — Progress Notes (Signed)
[facility] Counselor/Therapist Progress Note  Patient ID: [name], MRN: [id number],    Date: 05/28/2022  Time Spent: 45 mins  Treatment Type: Individual Therapy  Reported Symptoms: Pt presents for session via webex video.  Pt grants consent for session, stating he is in his home with no one else present.  I shared with pt that I am in my office at home with no one else here either.  Mental Status Exam: Appearance:  Casual     Behavior: Appropriate  Motor: Normal  Speech/Language:  Clear and Coherent  Affect: Appropriate  Mood: normal  Thought process: normal  Thought content:   WNL  Sensory/Perceptual disturbances:   WNL  Orientation: oriented to person, place, and time/date  Attention: Good  Concentration: Good  Memory: WNL  Fund of knowledge:  Good  Insight:   Good  Judgment:  Good  Impulse Control: Good   Risk Assessment: Danger to Self:  No Self-injurious Behavior: No Danger to Others: No Duty to Warn:no Physical Aggression / Violence:No  Access to Firearms a concern: No  Gang Involvement:No   Subjective: Pt shares that, "I have been doing pretty well since our last session."  Pt shares he has not had any anxious episodes since our session last week.  Pt shares that they went to a wedding of a friend on Saturday and their 32 yo son was in the wedding party.  Pt shares that his sleeping "has been up and down."  He sometimes wakes up feeling refreshed.  He is trying to manage his sleep by going to bed a bit earlier.  He is planning to schedule an appt with his sleep doctor to see if changes are needed in his CPAP settings.  Pt shares that he is supposed to be able to pick up his ADHD medication today.  Pt shares that he still does not like his job but he understands he still needs to work.  His wife is encouraging pt to find a different job.  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  He is hesitant to change careers because of  how well he has done in his career.  Pt has interviewed for a new job at work but has not gotten any feedback yet.  Encouraged pt to follow up with the people he interviewed with to let them know he is still interested.  He would like to be able to work from home again.  Pt and his family are going to [place] in August for a family vacation; they are going on a family cruise in October as well.  Pt shares that he did think about his Gratitude list.  "I came up with lots of different things."  He included his family (wife, children, parents, etc.).  He also included his ability to work through his ADHD through [employer] and college.  He is also grateful for therapy and the progress he is making here.  Encouraged pt to continue working on his list.  Also encouraged pt to begin thinking about what self care activities he wants to get involved in and to try to carve our time as many days a week as possible to do them.  We will meet in 2 wks for a follow up session.  Interventions: Cognitive Behavioral Therapy  Diagnosis:No diagnosis found.  Plan: Treatment Plan Strengths/Abilities:  Intelligent, Intuitive, Willing to participate in therapy Treatment Preferences:  Outpatient Individual Therapy Statement of Needs:  Patient  is to use CBT, mindfulness and coping skills to help manage and/or decrease symptoms associated with their diagnosis. Symptoms:  Depressed/Irritable mood, worry, social withdrawal Problems Addressed:  Depressive thoughts, Sadness, Sleep issues, etc. Long Term Goals:  Pt to reduce overall level, frequency, and intensity of the feelings of depression/anxiety as evidenced by decreased irritability, negative self talk, and helpless feelings from 6 to 7 days/week to 0 to 1 days/week, per client report, for at least 3 consecutive months.  Progress:  Short Term Goals:  Pt to verbally express understanding of the relationship between feelings of depression/anxiety and their impact on thinking patterns and  behaviors.  Pt to verbalize an understanding of the role that distorted thinking plays in creating fears, excessive worry, and ruminations.  Progress:  Target Date:  04/25/2023 Frequency:  Bi-weekly Modality:  Cognitive Behavioral Therapy Interventions by Therapist:  Therapist will use CBT, Mindfulness exercises, Coping skills and Referrals, as needed by client. Client has verbally approved this treatment plan.  Karie Kirks, [facility]

## 2022-05-29 ENCOUNTER — Other Ambulatory Visit: Payer: Self-pay | Admitting: Medical

## 2022-05-29 MED ORDER — TADALAFIL 5 MG PO TABS: 5.0000 mg | ORAL_TABLET | Freq: Every day | ORAL | 0 refills | Status: DC | PRN

## 2022-05-29 NOTE — Telephone Encounter (Signed)
P.A. denied for 20mg  Cialis but will pay for 2.5mg  or 5mg ,  called pt and he would like to switch to daily use.  Can pt be switched ?

## 2022-05-30 ENCOUNTER — Ambulatory Visit: Payer: BC Managed Care – PPO | Admitting: Psychology

## 2022-05-30 NOTE — Telephone Encounter (Signed)
Called pharmacy and 5mg  also requiring P.A.

## 2022-05-31 ENCOUNTER — Telehealth: Payer: Self-pay | Admitting: Medical

## 2022-05-31 NOTE — Telephone Encounter (Signed)
P.A. TADALAFIL 5MG 

## 2022-06-11 ENCOUNTER — Ambulatory Visit (INDEPENDENT_AMBULATORY_CARE_PROVIDER_SITE_OTHER): Payer: BC Managed Care – PPO | Admitting: Psychology

## 2022-06-11 DIAGNOSIS — F4323 Adjustment disorder with mixed anxiety and depressed mood: Secondary | ICD-10-CM | POA: Diagnosis not present

## 2022-06-11 NOTE — Progress Notes (Signed)
[facility] Counselor/Therapist Progress Note  Patient ID: [name], MRN: [id number],    Date: 06/11/2022  Time Spent: 45 mins  Treatment Type: Individual Therapy  Reported Symptoms: Pt presents for session via webex video.  Pt grants consent for session, stating he is in his home with no one else present.  I shared with pt that I am in my office at home with no one else here either.  Mental Status Exam: Appearance:  Casual     Behavior: Appropriate  Motor: Normal  Speech/Language:  Clear and Coherent  Affect: Appropriate  Mood: normal  Thought process: normal  Thought content:   WNL  Sensory/Perceptual disturbances:   WNL  Orientation: oriented to person, place, and time/date  Attention: Good  Concentration: Good  Memory: WNL  Fund of knowledge:  Good  Insight:   Good  Judgment:  Good  Impulse Control: Good   Risk Assessment: Danger to Self:  No Self-injurious Behavior: No Danger to Others: No Duty to Warn:no Physical Aggression / Violence:No  Access to Firearms a concern: No  Gang Involvement:No   Subjective: Pt shares that, "I have not been very good.  Sunday I went to work and was told that my FMLA claim was declined and I was put on a corrective action plan because they are counting those missed days against me.  My car broke down and both AC units in my house are not working.  There have been a lot of things going on."  Pt is scheduled for vacation most of next week and he is looking forward to the time off from work.  He is frustrated by the company's decision to discipline him; he wants to be able to continue with his therapy sessions and not have any absences for those sessions counted against him.  He was able to get his car fixed; they were at [facility] and could not get back in time for pt to go to work.  Pt shares he felt some anxiety getting ready for work on Sunday; he drove there and realized he forgot his badge so he had to go back home  to get it and was late for work on Sunday; when he got there, he was given the corrective action plan.  His wife is a Runner, broadcasting/film/video and is getting ready to go back to school soon; she has been working part-time for [employer] this summer and also working for [employer] in the summer as well.  His wife is very supportive of pt but she is getting worried about his job status as they need his money as well.  Pt has felt "overwhelmed" by the stuff at work.  Talked again with pt about the benefits of self care activities.  Pt enjoys walking although he has been having foot pain so he has not been walking as much.  He also likes listening to Audio books.  Encouraged pt to engage in at least 30 mins in the evenings after work to help him unwind.  Also encouraged pt to do more involved activities on his 3 days off per week.  Pt still does not have his ADHD medication and still needs to call his sleep specialist for an appt.  Pt and his family are going to [place] on Saturday for a family vacation; they are going on a family cruise in October as well.  Pt shares that he has not been working at [employer] since our last session; encouraged pt to continue  working on his list.  We will meet next week for a follow up session.  Interventions: Cognitive Behavioral Therapy  Diagnosis:Adjustment disorder with mixed anxiety and depressed mood  Plan: Treatment Plan Strengths/Abilities:  Intelligent, Intuitive, Willing to participate in therapy Treatment Preferences:  Outpatient Individual Therapy Statement of Needs:  Patient is to use CBT, mindfulness and coping skills to help manage and/or decrease symptoms associated with their diagnosis. Symptoms:  Depressed/Irritable mood, worry, social withdrawal Problems Addressed:  Depressive thoughts, Sadness, Sleep issues, etc. Long Term Goals:  Pt to reduce overall level, frequency, and intensity of the feelings of depression/anxiety as evidenced by decreased irritability, negative self  talk, and helpless feelings from 6 to 7 days/week to 0 to 1 days/week, per client report, for at least 3 consecutive months.  Progress:  Short Term Goals:  Pt to verbally express understanding of the relationship between feelings of depression/anxiety and their impact on thinking patterns and behaviors.  Pt to verbalize an understanding of the role that distorted thinking plays in creating fears, excessive worry, and ruminations.  Progress:  Target Date:  04/25/2023 Frequency:  Bi-weekly Modality:  Cognitive Behavioral Therapy Interventions by Therapist:  Therapist will use CBT, Mindfulness exercises, Coping skills and Referrals, as needed by client. Client has verbally approved this treatment plan.  Karie Kirks, [facility]

## 2022-06-13 NOTE — Telephone Encounter (Signed)
switched

## 2022-06-13 NOTE — Telephone Encounter (Signed)
P.A. approved til 05/29/25 sent mychart message

## 2022-06-18 ENCOUNTER — Ambulatory Visit (INDEPENDENT_AMBULATORY_CARE_PROVIDER_SITE_OTHER): Payer: BC Managed Care – PPO | Admitting: Psychology

## 2022-06-18 DIAGNOSIS — F4323 Adjustment disorder with mixed anxiety and depressed mood: Secondary | ICD-10-CM | POA: Diagnosis not present

## 2022-06-18 NOTE — Progress Notes (Signed)
[facility] Counselor/Therapist Progress Note  Patient ID: [name], MRN: [id number],    Date: 06/18/2022  Time Spent: 30 mins  Treatment Type: Individual Therapy  Reported Symptoms: Pt presents for session via webex video.  Pt grants consent for session, stating he is in his home with no one else present.  I shared with pt that I am in my office at home with no one else here either.  Mental Status Exam: Appearance:  Casual     Behavior: Appropriate  Motor: Normal  Speech/Language:  Clear and Coherent  Affect: Appropriate  Mood: normal  Thought process: normal  Thought content:   WNL  Sensory/Perceptual disturbances:   WNL  Orientation: oriented to person, place, and time/date  Attention: Good  Concentration: Good  Memory: WNL  Fund of knowledge:  Good  Insight:   Good  Judgment:  Good  Impulse Control: Good   Risk Assessment: Danger to Self:  No Self-injurious Behavior: No Danger to Others: No Duty to Warn:no Physical Aggression / Violence:No  Access to Firearms a concern: No  Gang Involvement:No   Subjective: Pt shares that, "I have been on vacation since Saturday.  My wife and I came on Saturday and my kids came down on Sunday."  Pt shares his wife woke up on Monday with a bed bug on her shirt; that upset her and that was not great for them.  Pt may have to come back home tomorrow afternoon if no one picks up his Friday shift.  He also shares that [name] told him last Friday that they have the paperwork from me and they were processing the request.  He was told on Monday that they did not have any info.  He will follow up with them again today.  This leave request is for intermittent FMLA to attend his therapy sessions.  Pt shares that they are having a good time on vacation; they are eating good food and enjoying the weather.  His wife is a Interior and spatial designer for [employer] and she has lots of new teachers to work with at the beginning of the school  year.  Pt shares that his 44 yo son is looking for a job because he did not enjoy school after HS.  His two younger sons will be a sophomore Nature conservation officer) and junior at [employer] this year.  Pt is planning to buy a kite today at the beach and fly it; he is looking forward to that activity.  We will meet next week for a follow up session.  Interventions: Cognitive Behavioral Therapy  Diagnosis:Adjustment disorder with mixed anxiety and depressed mood  Plan: Treatment Plan Strengths/Abilities:  Intelligent, Intuitive, Willing to participate in therapy Treatment Preferences:  Outpatient Individual Therapy Statement of Needs:  Patient is to use CBT, mindfulness and coping skills to help manage and/or decrease symptoms associated with their diagnosis. Symptoms:  Depressed/Irritable mood, worry, social withdrawal Problems Addressed:  Depressive thoughts, Sadness, Sleep issues, etc. Long Term Goals:  Pt to reduce overall level, frequency, and intensity of the feelings of depression/anxiety as evidenced by decreased irritability, negative self talk, and helpless feelings from 6 to 7 days/week to 0 to 1 days/week, per client report, for at least 3 consecutive months.  Progress:  Short Term Goals:  Pt to verbally express understanding of the relationship between feelings of depression/anxiety and their impact on thinking patterns and behaviors.  Pt to verbalize an understanding of the role that distorted thinking plays in creating fears,  excessive worry, and ruminations.  Progress:  Target Date:  04/25/2023 Frequency:  Bi-weekly Modality:  Cognitive Behavioral Therapy Interventions by Therapist:  Therapist will use CBT, Mindfulness exercises, Coping skills and Referrals, as needed by client. Client has verbally approved this treatment plan.  Karie Kirks, [facility]

## 2022-06-22 DIAGNOSIS — G4733 Obstructive sleep apnea (adult) (pediatric): Secondary | ICD-10-CM | POA: Diagnosis not present

## 2022-06-25 ENCOUNTER — Ambulatory Visit (INDEPENDENT_AMBULATORY_CARE_PROVIDER_SITE_OTHER): Payer: BC Managed Care – PPO | Admitting: Psychology

## 2022-06-25 DIAGNOSIS — F4323 Adjustment disorder with mixed anxiety and depressed mood: Secondary | ICD-10-CM | POA: Diagnosis not present

## 2022-06-25 NOTE — Progress Notes (Addendum)
[facility] Counselor/Therapist Progress Note  Patient ID: [name], MRN: [id number],    Date: 06/25/2022  Time Spent: 45 mins  Treatment Type: Individual Therapy  Reported Symptoms: Pt presents for session via webex video.  Pt grants consent for session, stating he is in his home with no one else present.  I shared with pt that I am in my office with no one else here either.  Mental Status Exam: Appearance:  Casual     Behavior: Appropriate  Motor: Normal  Speech/Language:  Clear and Coherent  Affect: Appropriate  Mood: normal  Thought process: normal  Thought content:   WNL  Sensory/Perceptual disturbances:   WNL  Orientation: oriented to person, place, and time/date  Attention: Good  Concentration: Good  Memory: WNL  Fund of knowledge:  Good  Insight:   Good  Judgment:  Good  Impulse Control: Good   Risk Assessment: Danger to Self:  No Self-injurious Behavior: No Danger to Others: No Duty to Warn:no Physical Aggression / Violence:No  Access to Firearms a concern: No  Gang Involvement:No   Subjective: Pt shares that, "We got back from the beach OK and we enjoyed the time away.  [name] approved my FMLA request and I also got my ADHD medication refilled." Talked with pt about his outstanding balance with the practice and pt states he will call them today to make sure they are filing the claims correctly.  Pt shares that work is going OK; he was able to talk with management at work about him having to go back to the office for work, rather than working from home, which he would prefer.  He believes that what he had to say, "fell on deaf ears" but he hopes that he will get to work from home again at some point soon.  Pt is working 8am to 7pm, Sun, Mon,Thur, and Fri.  Pt shares that he is looking for a new job at this time; he is struggling to find an option that pays him close to what he makes at work.  Pt shares he went for a short walk last evening and  described it as enjoyable.  Pt shares that his wife has started back to school for teacher work days this week; Their boys are going to be 10th and 11th graders at [employer] this year.  His older son has not yet had luck in finding a new job; he is working part time at a Personal assistant in [place].  Pt shares he would like to talk more about how to motivate himself to address things around him for the home and the family. We talked about making short lists of what he wants to accomplish frequently.  Pt also wanted to talk about his expectations for his oldest son (40 yo).  We talked about male adolescent brains normally do not finish developing until the age of 44.  We will meet next week for a follow up session.  Interventions: Cognitive Behavioral Therapy  Diagnosis:No diagnosis found.  Plan: Treatment Plan Strengths/Abilities:  Intelligent, Intuitive, Willing to participate in therapy Treatment Preferences:  Outpatient Individual Therapy Statement of Needs:  Patient is to use CBT, mindfulness and coping skills to help manage and/or decrease symptoms associated with their diagnosis. Symptoms:  Depressed/Irritable mood, worry, social withdrawal Problems Addressed:  Depressive thoughts, Sadness, Sleep issues, etc. Long Term Goals:  Pt to reduce overall level, frequency, and intensity of the feelings of depression/anxiety as evidenced by decreased irritability, negative self talk,  and helpless feelings from 6 to 7 days/week to 0 to 1 days/week, per client report, for at least 3 consecutive months.  Progress:  Short Term Goals:  Pt to verbally express understanding of the relationship between feelings of depression/anxiety and their impact on thinking patterns and behaviors.  Pt to verbalize an understanding of the role that distorted thinking plays in creating fears, excessive worry, and ruminations.  Progress:  Target Date:  04/25/2023 Frequency:  Bi-weekly Modality:  Cognitive Behavioral  Therapy Interventions by Therapist:  Therapist will use CBT, Mindfulness exercises, Coping skills and Referrals, as needed by client. Client has verbally approved this treatment plan.  Karie Kirks, [facility]

## 2022-07-02 ENCOUNTER — Ambulatory Visit (INDEPENDENT_AMBULATORY_CARE_PROVIDER_SITE_OTHER): Payer: BC Managed Care – PPO | Admitting: Psychology

## 2022-07-02 DIAGNOSIS — F4323 Adjustment disorder with mixed anxiety and depressed mood: Secondary | ICD-10-CM | POA: Diagnosis not present

## 2022-07-02 NOTE — Progress Notes (Signed)
Behavioral Health Counselor/Therapist Progress Note  Patient ID: [name], MRN: [id number],    Date: 07/02/2022  Time Spent: 45 mins  Treatment Type: Individual Therapy  Reported Symptoms: Pt presents for session via webex video.  Pt grants consent for session, stating he is in his home with no one else present.  I shared with pt that I am in my office with no one else here either.  Mental Status Exam: Appearance:  Casual     Behavior: Appropriate  Motor: Normal  Speech/Language:  Clear and Coherent  Affect: Appropriate  Mood: normal  Thought process: normal  Thought content:   WNL  Sensory/Perceptual disturbances:   WNL  Orientation: oriented to person, place, and time/date  Attention: Good  Concentration: Good  Memory: WNL  Fund of knowledge:  Good  Insight:   Good  Judgment:  Good  Impulse Control: Good   Risk Assessment: Danger to Self:  No Self-injurious Behavior: No Danger to Others: No Duty to Warn:no Physical Aggression / Violence:No  Access to Firearms a concern: No  Gang Involvement:No   Subjective: Pt shares that, "This past week has not been bad, it has just been busy with my wife and kids getting back into school."  Pt shares, "They have been doing it for so long that they are pretty good at it by now."  Pt is now back taking his ADHD medication and he is having to adjust to the medications again.  Pt shares that next week will be a little more involved because he will try to be working a little OT in order to help him with his bills; they are offering incentive pay that will be beneficial for him.  He is intending to work a few hours on T and W, his regular days off to see how that goes for him.   Pt is working 8am to 7pm, Sun, Mon,Thur, and Fri.  Pt shares that he and his wife went to the [facility] on Saturday and ran some other errands together and he enjoyed being out with her.  Pt shares that he struggled to get motivated yesterday to get  anything accomplished.  Talked with pt about starting with small items and build up to bigger issues.  Encouraged pt to be intentional about engaging in his self care activities and we will meet next week for a follow up session.  Interventions: Cognitive Behavioral Therapy  Diagnosis:Adjustment disorder with mixed anxiety and depressed mood  Plan: Treatment Plan Strengths/Abilities:  Intelligent, Intuitive, Willing to participate in therapy Treatment Preferences:  Outpatient Individual Therapy Statement of Needs:  Patient is to use CBT, mindfulness and coping skills to help manage and/or decrease symptoms associated with their diagnosis. Symptoms:  Depressed/Irritable mood, worry, social withdrawal Problems Addressed:  Depressive thoughts, Sadness, Sleep issues, etc. Long Term Goals:  Pt to reduce overall level, frequency, and intensity of the feelings of depression/anxiety as evidenced by decreased irritability, negative self talk, and helpless feelings from 6 to 7 days/week to 0 to 1 days/week, per client report, for at least 3 consecutive months.  Progress:  Short Term Goals:  Pt to verbally express understanding of the relationship between feelings of depression/anxiety and their impact on thinking patterns and behaviors.  Pt to verbalize an understanding of the role that distorted thinking plays in creating fears, excessive worry, and ruminations.  Progress:  Target Date:  04/25/2023 Frequency:  Bi-weekly Modality:  Cognitive Behavioral Therapy Interventions by Therapist:  Therapist will use CBT,  Mindfulness exercises, Coping skills and Referrals, as needed by client. Client has verbally approved this treatment plan.  Karie Kirks, [facility]

## 2022-07-09 ENCOUNTER — Encounter: Payer: Self-pay | Admitting: Internal Medicine

## 2022-07-09 ENCOUNTER — Ambulatory Visit: Payer: BC Managed Care – PPO | Admitting: Psychology

## 2022-07-16 ENCOUNTER — Ambulatory Visit: Payer: BC Managed Care – PPO | Admitting: Psychology

## 2022-07-22 DIAGNOSIS — G4733 Obstructive sleep apnea (adult) (pediatric): Secondary | ICD-10-CM | POA: Diagnosis not present

## 2022-07-23 ENCOUNTER — Ambulatory Visit: Payer: BC Managed Care – PPO | Admitting: Psychology

## 2022-07-25 DIAGNOSIS — G4733 Obstructive sleep apnea (adult) (pediatric): Secondary | ICD-10-CM | POA: Diagnosis not present

## 2022-08-11 DIAGNOSIS — Z79899 Other long term (current) drug therapy: Secondary | ICD-10-CM | POA: Diagnosis not present

## 2022-08-11 DIAGNOSIS — F902 Attention-deficit hyperactivity disorder, combined type: Secondary | ICD-10-CM | POA: Diagnosis not present

## 2022-08-12 ENCOUNTER — Encounter: Payer: Self-pay | Admitting: Internal Medicine

## 2022-08-12 DIAGNOSIS — Z79899 Other long term (current) drug therapy: Secondary | ICD-10-CM | POA: Diagnosis not present

## 2022-08-13 ENCOUNTER — Ambulatory Visit (INDEPENDENT_AMBULATORY_CARE_PROVIDER_SITE_OTHER): Payer: BC Managed Care – PPO | Admitting: Psychology

## 2022-08-13 DIAGNOSIS — F4323 Adjustment disorder with mixed anxiety and depressed mood: Secondary | ICD-10-CM

## 2022-08-13 NOTE — Progress Notes (Signed)
[facility] Counselor/Therapist Progress Note  Patient ID: [name], MRN: [id number],    Date: 08/13/2022  Time Spent: 45 mins  Treatment Type: Individual Therapy  Reported Symptoms: Pt presents for session via webex video.  Pt grants consent for session, stating he is in his home with no one else present.  I shared with pt that I am in my office with no one else here either.  Mental Status Exam: Appearance:  Casual     Behavior: Appropriate  Motor: Normal  Speech/Language:  Clear and Coherent  Affect: Appropriate  Mood: normal  Thought process: normal  Thought content:   WNL  Sensory/Perceptual disturbances:   WNL  Orientation: oriented to person, place, and time/date  Attention: Good  Concentration: Good  Memory: WNL  Fund of knowledge:  Good  Insight:   Good  Judgment:  Good  Impulse Control: Good   Risk Assessment: Danger to Self:  No Self-injurious Behavior: No Danger to Others: No Duty to Warn:no Physical Aggression / Violence:No  Access to Firearms a concern: No  Gang Involvement:No   Subjective: Pt shares that, "Things have been up and down since we met last.  My anxiety has been better; I have not had any anxious episodes since our last session.  My depression has been a little bit worse; I think it is due to a change that they made in my ADHD meds."  Pt shares he missed his last session because he was in bed and could not get himself together.  He talked with his ADHD medication doctor and got info about how to take his medication appropriately.  He saw this doctor again yesterday and he feels better about where he is at this point.  He is hopeful that he will sleep better based on the new directions.  Pt feels like he is improving in the area of getting things done.  He is realizing that he is making progress and is trying to give himself more credit for the things he is accomplishing.  Pt shares that "overall, I do feel better about the  direction I am heading."  Pt continues to work Sun, Mon, [place], and Fri; 10 hrs each day.  Pt shares that he is still going to the office daily and work is going OK.  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  Pt is going on a cruise to the [place] next week with his family and his wife's mom and sister.  Pt shares that he did work the OT that he was planning to and "it went OK."  He did not like being at work more days than normal and has taken a break at this point.  He was able to get some bills caught up with the extra money coming in.  Pt confirms he has had no anxious episodes since our last session.  Pt shares that he has not been doing very much self care activity since our last session because he has been working or doing duties at home or for his parents.  Encouraged pt to be intentional about engaging in his self care activities and we will meet next week for a follow up session.  Interventions: Cognitive Behavioral Therapy  Diagnosis:Adjustment disorder with mixed anxiety and depressed mood  Plan: Treatment Plan Strengths/Abilities:  Intelligent, Intuitive, Willing to participate in therapy Treatment Preferences:  Outpatient Individual Therapy Statement of Needs:  Patient is to use CBT, mindfulness and coping skills to help manage and/or decrease symptoms associated with their diagnosis. Symptoms:  Depressed/Irritable mood, worry, social withdrawal Problems Addressed:  Depressive thoughts, Sadness, Sleep issues, etc. Long Term Goals:  Pt to reduce overall level, frequency, and intensity of the feelings of depression/anxiety as evidenced by decreased irritability, negative self talk, and helpless feelings from 6 to 7 days/week to 0 to 1 days/week, per client report, for at least 3 consecutive months.  Progress:  Short Term Goals:  Pt to verbally express understanding of the relationship between feelings of depression/anxiety  and their impact on thinking patterns and behaviors.  Pt to verbalize an understanding of the role that distorted thinking plays in creating fears, excessive worry, and ruminations.  Progress:  Target Date:  04/25/2023 Frequency:  Bi-weekly Modality:  Cognitive Behavioral Therapy Interventions by Therapist:  Therapist will use CBT, Mindfulness exercises, Coping skills and Referrals, as needed by client. Client has verbally approved this treatment plan.  Ivan Anchors, [facility]

## 2022-08-14 ENCOUNTER — Other Ambulatory Visit: Payer: Self-pay | Admitting: Medical

## 2022-08-21 DIAGNOSIS — G4733 Obstructive sleep apnea (adult) (pediatric): Secondary | ICD-10-CM | POA: Diagnosis not present

## 2022-08-27 ENCOUNTER — Ambulatory Visit: Payer: BC Managed Care – PPO | Admitting: Psychology

## 2022-09-03 ENCOUNTER — Ambulatory Visit (INDEPENDENT_AMBULATORY_CARE_PROVIDER_SITE_OTHER): Payer: BC Managed Care – PPO | Admitting: Psychology

## 2022-09-03 DIAGNOSIS — N182 Chronic kidney disease, stage 2 (mild): Secondary | ICD-10-CM | POA: Diagnosis not present

## 2022-09-03 DIAGNOSIS — E785 Hyperlipidemia, unspecified: Secondary | ICD-10-CM | POA: Diagnosis not present

## 2022-09-03 DIAGNOSIS — F4323 Adjustment disorder with mixed anxiety and depressed mood: Secondary | ICD-10-CM | POA: Diagnosis not present

## 2022-09-03 DIAGNOSIS — N1831 Chronic kidney disease, stage 3a: Secondary | ICD-10-CM | POA: Diagnosis not present

## 2022-09-03 DIAGNOSIS — I129 Hypertensive chronic kidney disease with stage 1 through stage 4 chronic kidney disease, or unspecified chronic kidney disease: Secondary | ICD-10-CM | POA: Diagnosis not present
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
## 2022-09-09 ENCOUNTER — Ambulatory Visit (INDEPENDENT_AMBULATORY_CARE_PROVIDER_SITE_OTHER): Payer: BC Managed Care – PPO | Admitting: Psychology

## 2022-09-09 DIAGNOSIS — F4323 Adjustment disorder with mixed anxiety and depressed mood: Secondary | ICD-10-CM | POA: Diagnosis not present

## 2022-09-09 NOTE — Progress Notes (Signed)
[facility] Counselor/Therapist Progress Note  Patient ID: [name], MRN: [id number],    Date: 09/09/2022  Time Spent: 30 mins  Treatment Type: Individual Therapy  Reported Symptoms: Pt presents for session via webex video.  Pt grants consent for session, stating he is in his home with no one else present.  I shared with pt that I am in my office with no one else here either.  Mental Status Exam: Appearance:  Casual     Behavior: Appropriate  Motor: Normal  Speech/Language:  Clear and Coherent  Affect: Appropriate  Mood: normal  Thought process: normal  Thought content:   WNL  Sensory/Perceptual disturbances:   WNL  Orientation: oriented to person, place, and time/date  Attention: Good  Concentration: Good  Memory: WNL  Fund of knowledge:  Good  Insight:   Good  Judgment:  Good  Impulse Control: Good   Risk Assessment: Danger to Self:  No Self-injurious Behavior: No Danger to Others: No Duty to Warn:no Physical Aggression / Violence:No  Access to Firearms a concern: No  Gang Involvement:No   Subjective: Pt shares that, "I have been doing pretty good since last week.  We had a good week as a family.  I was able to get several things accomplished this past week and feel good about that."  Pt shares he finished repairing his parents old home and feels really good about that.  He is hopeful to begin some self care activities since he is not as busy as he has been.  "Me and the dog may go to the park today since it is a nice day."  Congratulated pt on beginning to think in this way.  Pt shares that he has always had difficulty with the holiday season; several family members have passed away at this time of year.  He shares that he feels less overwhelmed right now because he has been able to accomplish several tasks that were hanging over him.  He is now trying to figure how to approach work; he has learned that he will have to continue to go to the office until  at least Feb so he is focusing on doing as well as he can with work between now and Feb.  Pt shares that he has been sleeping well since last week, even with the time change.  "I have been doing a better job of going to bed at good times for me.  I am still using my sleep apnea machine nightly and it is helpful to me."  Pt shares he continues to take his ADHD medication with good results.  Pt shares that he and his family normally go to his in-laws for Thanksgiving.  This year he has to work on Thanksgiving; his younger two kids are going with pt's wife to [place] to see her sister and her family.  His oldest son will be staying here to have Thanksgiving dinner with his girlfriend and her family.  Encouraged pt to be intentional about engaging in his self care activities and we will meet next week for a follow up session.  Interventions: Cognitive Behavioral Therapy  Diagnosis:Adjustment disorder with mixed anxiety and depressed mood  Plan: Treatment Plan Strengths/Abilities:  Intelligent, Intuitive, Willing to participate in therapy Treatment Preferences:  Outpatient Individual Therapy Statement of Needs:  Patient is to use CBT, mindfulness and coping skills to help manage and/or decrease symptoms associated with their diagnosis. Symptoms:  Depressed/Irritable mood, worry, social withdrawal Problems Addressed:  Depressive thoughts,  Sadness, Sleep issues, etc. Long Term Goals:  Pt to reduce overall level, frequency, and intensity of the feelings of depression/anxiety as evidenced by decreased irritability, negative self talk, and helpless feelings from 6 to 7 days/week to 0 to 1 days/week, per client report, for at least 3 consecutive months.  Progress:  Short Term Goals:  Pt to verbally express understanding of the relationship between feelings of depression/anxiety and their impact on thinking patterns and behaviors.  Pt to verbalize an understanding of the role that distorted thinking plays in  creating fears, excessive worry, and ruminations.  Progress:  Target Date:  04/25/2023 Frequency:  Bi-weekly Modality:  Cognitive Behavioral Therapy Interventions by Therapist:  Therapist will use CBT, Mindfulness exercises, Coping skills and Referrals, as needed by client. Client has verbally approved this treatment plan.  Ivan Anchors, [facility]

## 2022-09-17 ENCOUNTER — Ambulatory Visit: Payer: BC Managed Care – PPO | Admitting: Psychology

## 2022-09-21 DIAGNOSIS — G4733 Obstructive sleep apnea (adult) (pediatric): Secondary | ICD-10-CM | POA: Diagnosis not present

## 2022-10-01 ENCOUNTER — Ambulatory Visit (INDEPENDENT_AMBULATORY_CARE_PROVIDER_SITE_OTHER): Payer: BC Managed Care – PPO | Admitting: Psychology

## 2022-10-01 DIAGNOSIS — F4323 Adjustment disorder with mixed anxiety and depressed mood: Secondary | ICD-10-CM

## 2022-10-01 NOTE — Progress Notes (Signed)
[facility] Counselor/Therapist Progress Note  Patient ID: [name], MRN: [id number],    Date: 10/01/2022  Time Spent: 45 mins  Treatment Type: Individual Therapy  Reported Symptoms: Pt presents for session via webex video.  Pt grants consent for session, stating he is in his home with no one else present.  I shared with pt that I am in my office with no one else here either.  Mental Status Exam: Appearance:  Casual     Behavior: Appropriate  Motor: Normal  Speech/Language:  Clear and Coherent  Affect: Appropriate  Mood: normal  Thought process: normal  Thought content:   WNL  Sensory/Perceptual disturbances:   WNL  Orientation: oriented to person, place, and time/date  Attention: Good  Concentration: Good  Memory: WNL  Fund of knowledge:  Good  Insight:   Good  Judgment:  Good  Impulse Control: Good   Risk Assessment: Danger to Self:  No Self-injurious Behavior: No Danger to Others: No Duty to Warn:no Physical Aggression / Violence:No  Access to Firearms a concern: No  Gang Involvement:No   Subjective: Pt shares that, "Thanksgiving was OK; I worked a few hours on Thanksgiving day and we went to my mom's house for dinner.  The boys stayed here and we spent the weekend together.  My wife and her mom went to her sister's in [place] for the holiday. They had a good time there."  Pt shares that working on Thanksgiving day was pretty slow so it was not hard to work that day.  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  He shares that he made some money and then he lost that money again, "because I was careless in processing the trade before I submitted it.  I was being really hard on myself about that.  I am starting to bounce back this week.  I was making the trade at work, which was a mistake because I was not able to focus as much as I needed to and I have learned from that experience.  It was a blow to my ego."  Pt  would like for stock trading to be an additional self care activity.  Pt shares that he and his family are going to visit his sister this weekend in [place].  His sister is in a transition period right now because they sold their home in [place], [place] and sold their business as well.  They are not sure where they want to settle yet.  Pt shares that his oldest son is looking for a job so he can be making some money; he has a job interview today.  Pt is supportive of his son not wanting to go to school but his son also needs to learn to provide for himself.  Pt shares he has used up his vacation days for this year and will get new ones at the beginning of 2024.  Pt continues to sleep well and he is thankful for that.  Pt shares he continues to take his ADHD medication with good results.  Encouraged pt to be intentional about engaging in his self care activities and we will meet in 2 weeks for a follow up session.  Interventions: Cognitive Behavioral Therapy  Diagnosis:Adjustment disorder with mixed anxiety and depressed mood  Plan: Treatment Plan Strengths/Abilities:  Intelligent, Intuitive, Willing to participate in therapy Treatment Preferences:  Outpatient Individual Therapy Statement of Needs:  Patient is to use  CBT, mindfulness and coping skills to help manage and/or decrease symptoms associated with their diagnosis. Symptoms:  Depressed/Irritable mood, worry, social withdrawal Problems Addressed:  Depressive thoughts, Sadness, Sleep issues, etc. Long Term Goals:  Pt to reduce overall level, frequency, and intensity of the feelings of depression/anxiety as evidenced by decreased irritability, negative self talk, and helpless feelings from 6 to 7 days/week to 0 to 1 days/week, per client report, for at least 3 consecutive months.  Progress:  Short Term Goals:  Pt to verbally express understanding of the relationship between feelings of depression/anxiety and their impact on thinking patterns  and behaviors.  Pt to verbalize an understanding of the role that distorted thinking plays in creating fears, excessive worry, and ruminations.  Progress:  Target Date:  04/25/2023 Frequency:  Bi-weekly Modality:  Cognitive Behavioral Therapy Interventions by Therapist:  Therapist will use CBT, Mindfulness exercises, Coping skills and Referrals, as needed by client. Client has verbally approved this treatment plan.  Karie Kirks, [facility]

## 2022-10-15 ENCOUNTER — Other Ambulatory Visit: Payer: Self-pay | Admitting: Medical

## 2022-10-15 ENCOUNTER — Ambulatory Visit: Payer: BC Managed Care – PPO | Admitting: Psychology

## 2022-10-16 NOTE — Telephone Encounter (Signed)
Refill request last apt 04/14/22.

## 2022-11-05 ENCOUNTER — Other Ambulatory Visit: Payer: Self-pay | Admitting: Medical

## 2022-11-28 ENCOUNTER — Other Ambulatory Visit: Payer: Self-pay | Admitting: Medical

## 2022-11-28 NOTE — Telephone Encounter (Signed)
Pt has an appt in February 

## 2022-12-21 ENCOUNTER — Other Ambulatory Visit: Payer: Self-pay | Admitting: Medical

## 2022-12-24 ENCOUNTER — Encounter: Payer: BC Managed Care – PPO | Admitting: Medical

## 2022-12-26 ENCOUNTER — Encounter: Payer: Self-pay | Admitting: Medical

## 2023-01-12 ENCOUNTER — Other Ambulatory Visit: Payer: Self-pay | Admitting: Medical

## 2023-01-12 NOTE — Telephone Encounter (Signed)
Pt has an appt in April 

## 2023-01-13 ENCOUNTER — Other Ambulatory Visit: Payer: Self-pay | Admitting: Medical

## 2023-02-03 DIAGNOSIS — Z79899 Other long term (current) drug therapy: Secondary | ICD-10-CM | POA: Diagnosis not present

## 2023-02-03 DIAGNOSIS — Z5181 Encounter for therapeutic drug level monitoring: Secondary | ICD-10-CM | POA: Diagnosis not present

## 2023-02-03 DIAGNOSIS — F902 Attention-deficit hyperactivity disorder, combined type: Secondary | ICD-10-CM | POA: Diagnosis not present

## 2023-02-03 DIAGNOSIS — Z79891 Long term (current) use of opiate analgesic: Secondary | ICD-10-CM | POA: Diagnosis not present

## 2023-02-11 ENCOUNTER — Encounter: Payer: BC Managed Care – PPO | Admitting: Medical

## 2023-02-11 ENCOUNTER — Encounter: Payer: Self-pay | Admitting: Family Medicine

## 2023-02-11 NOTE — Telephone Encounter (Signed)
This patient no showed for their appointment today.Which of the following is necessary for this patient.   A) No follow-up necessary   B) Follow-up urgent. Locate Patient Immediately.   C) Follow-up necessary. Contact patient and Schedule visit in ____ Days.   D) Follow-up Advised. Contact patient and Schedule visit in ____ Days.   E) Please Send no show letter to patient. Charge no show fee if no show was a CPE.    Actually, I think he is a repeat no show offender. If so, time to dismiss.

## 2023-02-24 ENCOUNTER — Ambulatory Visit (INDEPENDENT_AMBULATORY_CARE_PROVIDER_SITE_OTHER): Payer: BC Managed Care – PPO | Admitting: Psychology

## 2023-02-24 DIAGNOSIS — F4323 Adjustment disorder with mixed anxiety and depressed mood: Secondary | ICD-10-CM | POA: Diagnosis not present

## 2023-02-24 NOTE — Progress Notes (Signed)
[facility] Counselor/Therapist Progress Note  Patient ID: [name], MRN: [id number],    Date: 02/24/2023  Time Spent: 45 mins  Treatment Type: Individual Therapy  Reported Symptoms: Pt presents for session via Caregility video.  Pt grants consent for session, stating he is in his car with no one else present.  I shared with pt that I am in my office with no one else here either.  Mental Status Exam: Appearance:  Casual     Behavior: Appropriate  Motor: Normal  Speech/Language:  Clear and Coherent  Affect: Appropriate  Mood: normal  Thought process: normal  Thought content:   WNL  Sensory/Perceptual disturbances:   WNL  Orientation: oriented to person, place, and time/date  Attention: Good  Concentration: Good  Memory: WNL  Fund of knowledge:  Good  Insight:   Good  Judgment:  Good  Impulse Control: Good   Risk Assessment: Danger to Self:  No Self-injurious Behavior: No Danger to Others: No Duty to Warn:no Physical Aggression / Violence:No  Access to Firearms a concern: No  Gang Involvement:No   Subjective: Pt shares that, "To be honest, I have not been great.  I have had trouble getting my ADHD medication and that has caused me to spiral down and feel more depressed.  I have been able to get my medicine back now so I am hopeful that I am heading back in the right direction."  Pt shares that his wife was noticing that he was struggling with his mood when he was off his medication.  "I tend to hide from others when I am struggling with depression and that is what my wife would see."  Pt is still working 10 hour days, 4 days per week.  Pt shares that work is going OK, but his depression has caused him to miss some days of work but when he is working, his performance is good.  Pt shares that he is on a final warning for his attendance and it will last for 12 months.  Pt shares that he was not sleeping well when he did not have his medicine but he is sleeping  better now ("probably about 4-6 hours per night and that is enough for me; I have never really needed that much sleep").  Pt shares that his wife was concerned that he was not being as helpful around the home for her.  He also had a panic attack when he was out with her and they had car trouble; that is the event that precipitated his wife insisting that he reconnect with therapy.  "I feel like there is pressure everywhere in my life right now (i.e., at home, at work, with my parents, etc.).  Pt shares that his parents are doing well and are taking care of themselves but he does feel a need to check on them frequently.  Pt shares that his father is still working at 79 yo; he is the Estate agent for [employer] at [employer].  Pt shares that he has not been doing as many self care activities lately.  He does note that things in his life could be a lot worse; all of his bills are paid, his family is healthy and safe, etc.  He still does not enjoy his work life but he does not want to quit; he has been there for 13 yrs and does not want to have to start over with another company.  His supervisor, Alvino Chapel, is very supportive of pt (they  started together in the same training class 13 yrs ago).  She is happy to help him find something different within the company after he is out of his current final warning for attendance.  He would prefer to work from home, if that becomes an option for him at some point.  Pt shares that his sons are doing well; his 11 yo son did not want to go to school so they told him he had to find a job if he was not going to go to school.  He has recently gotten a job at [employer] and has been working there for the past 2 months; he would like more hours but has not been able to get them yet.  He may be changing his perspective about school because he sees that only having a HS diploma is limiting his options for a good job.  There is a chance that pt can help him get an interview at [employer] where pt works.   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  Encouraged pt to be intentional about engaging in his self care activities and we will meet in 2 weeks for a follow up session.  Interventions: Cognitive Behavioral Therapy  Diagnosis:Adjustment disorder with mixed anxiety and depressed mood  Plan: Treatment Plan Strengths/Abilities:  Intelligent, Intuitive, Willing to participate in therapy Treatment Preferences:  Outpatient Individual Therapy Statement of Needs:  Patient is to use CBT, mindfulness and coping skills to help manage and/or decrease symptoms associated with their diagnosis. Symptoms:  Depressed/Irritable mood, worry, social withdrawal Problems Addressed:  Depressive thoughts, Sadness, Sleep issues, etc. Long Term Goals:  Pt to reduce overall level, frequency, and intensity of the feelings of depression/anxiety as evidenced by decreased irritability, negative self talk, and helpless feelings from 6 to 7 days/week to 0 to 1 days/week, per client report, for at least 3 consecutive months.  Progress:  Short Term Goals:  Pt to verbally express understanding of the relationship between feelings of depression/anxiety and their impact on thinking patterns and behaviors.  Pt to verbalize an understanding of the role that distorted thinking plays in creating fears, excessive worry, and ruminations.  Progress:  Target Date:  04/24/2024 Frequency:  Bi-weekly Modality:  Cognitive Behavioral Therapy Interventions by Therapist:  Therapist will use CBT, Mindfulness exercises, Coping skills and Referrals, as needed by client. Client has verbally approved this treatment plan.  Karie Kirks, [facility]

## 2023-03-04 ENCOUNTER — Ambulatory Visit (INDEPENDENT_AMBULATORY_CARE_PROVIDER_SITE_OTHER): Payer: BC Managed Care – PPO | Admitting: Psychology

## 2023-03-04 DIAGNOSIS — F4323 Adjustment disorder with mixed anxiety and depressed mood: Secondary | ICD-10-CM

## 2023-03-04 NOTE — Progress Notes (Signed)
[facility] Counselor/Therapist Progress Note  Patient ID: [name], MRN: [id number],    Date: 03/04/2023  Time Spent: 45 mins  Treatment Type: Individual Therapy  Reported Symptoms: Pt presents for session via Caregility video.  Pt grants consent for session, stating he is in his car with no one else present.  I shared with pt that I am in my office with no one else here either.  Mental Status Exam: Appearance:  Casual     Behavior: Appropriate  Motor: Normal  Speech/Language:  Clear and Coherent  Affect: Appropriate  Mood: normal  Thought process: normal  Thought content:   WNL  Sensory/Perceptual disturbances:   WNL  Orientation: oriented to person, place, and time/date  Attention: Good  Concentration: Good  Memory: WNL  Fund of knowledge:  Good  Insight:   Good  Judgment:  Good  Impulse Control: Good   Risk Assessment: Danger to Self:  No Self-injurious Behavior: No Danger to Others: No Duty to Warn:no Physical Aggression / Violence:No  Access to Firearms a concern: No  Gang Involvement:No   Subjective: Pt shares that, "I have been OK since our last session; I have been able to get outside and walk some since the weather has been so nice."  Pt shares that he has taken the chance to walk his dog the last couple of evenings after he has gotten home from work.  Pt shows me his rose garden that he enjoys tending.  He is also back taking his ADHD medication because it is now back in stock and that has been helpful.  Talked with pt about his FMLA paperwork that is due today to his employer and he asked to have it completed the same way as we did last time.  He shares that work is going OK; he continues to get close to meeting his work targets; with the better his performance standards are, the more money he can make.  He describes the performance evaluation process with [employer] as very extensive.  He is trying to move from Rep 1 level to Rep 2 level that would  equal a raise for him and may eventually allow him to work from home again.  Pt is still working 10 hour days, 4 days per week.  Pt shares that his wife, who is a Runner, broadcasting/film/video Journalist, newspaper), has decided not to work this summer so they will be dependent on his paycheck for the summer.  Her dad passed away last year so she is wanting to take this summer off from work.  Pt shares that, since he talks to customers all day at work, he tends to want to be more quiet in the evenings.  Pt shares that he and his family are going to [facility] in July for a family vacation this year and the boys (69, 78, and 45 yo) are excited about going.  Pt shares that he has attended work daily for the past week.  His boys are expressing some interest in working some over the summer and pt and his wife are supportive of that idea as well.  Pt shares that his oldest son is looking for a new job and has applied for a position at [employer] and pt hopes he can get into the training class for May there.  He is also thinking about going to [facility] for training regarding a trade in the future.  Pt denies any additional panic attacks since our last session.  Pt shares that he is  sleeping better now ("probably about 4-6 hours per night and that is enough for me; I have never really needed that much sleep").  "I feel like there is pressure everywhere in my life right now (i.e., at home, at work, with my parents, etc.).  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  Encouraged pt to be intentional about engaging in his self care activities and we will meet in 2 weeks for a follow up session.  Interventions: Cognitive Behavioral Therapy  Diagnosis:Adjustment disorder with mixed anxiety and depressed mood  Plan: Treatment Plan Strengths/Abilities:  Intelligent, Intuitive, Willing to participate in therapy Treatment Preferences:  Outpatient Individual Therapy Statement of  Needs:  Patient is to use CBT, mindfulness and coping skills to help manage and/or decrease symptoms associated with their diagnosis. Symptoms:  Depressed/Irritable mood, worry, social withdrawal Problems Addressed:  Depressive thoughts, Sadness, Sleep issues, etc. Long Term Goals:  Pt to reduce overall level, frequency, and intensity of the feelings of depression/anxiety as evidenced by decreased irritability, negative self talk, and helpless feelings from 6 to 7 days/week to 0 to 1 days/week, per client report, for at least 3 consecutive months.  Progress:  Short Term Goals:  Pt to verbally express understanding of the relationship between feelings of depression/anxiety and their impact on thinking patterns and behaviors.  Pt to verbalize an understanding of the role that distorted thinking plays in creating fears, excessive worry, and ruminations.  Progress:  Target Date:  04/24/2024 Frequency:  Bi-weekly Modality:  Cognitive Behavioral Therapy Interventions by Therapist:  Therapist will use CBT, Mindfulness exercises, Coping skills and Referrals, as needed by client. Client has verbally approved this treatment plan.  Karie Kirks, [facility]

## 2023-03-12 ENCOUNTER — Other Ambulatory Visit: Payer: Self-pay | Admitting: Medical

## 2023-03-18 ENCOUNTER — Encounter: Payer: BC Managed Care – PPO | Admitting: Psychology

## 2023-03-18 NOTE — Patient Instructions (Signed)
Erroneous encounter

## 2023-03-18 NOTE — Progress Notes (Signed)
[facility] Counselor/Therapist Progress Note  Patient ID: [name], MRN: [id number],    Date: 03/18/2023  Time Spent: 45 mins  Treatment Type: Individual Therapy  Reported Symptoms: Pt presents for session via Caregility video.  Pt grants consent for session, stating he is in his car with no one else present.  I shared with pt that I am in my office with no one else here either.  Mental Status Exam: Appearance:  Casual     Behavior: Appropriate  Motor: Normal  Speech/Language:  Clear and Coherent  Affect: Appropriate  Mood: normal  Thought process: normal  Thought content:   WNL  Sensory/Perceptual disturbances:   WNL  Orientation: oriented to person, place, and time/date  Attention: Good  Concentration: Good  Memory: WNL  Fund of knowledge:  Good  Insight:   Good  Judgment:  Good  Impulse Control: Good   Risk Assessment: Danger to Self:  No Self-injurious Behavior: No Danger to Others: No Duty to Warn:no Physical Aggression / Violence:No  Access to Firearms a concern: No  Gang Involvement:No   Subjective: Pt shares that, "I have been OK since our last session; I have been able to get outside and walk some since the weather has been so nice."  Pt shares that he has taken the chance to walk his dog the last couple of evenings after he has gotten home from work.  Pt shows me his rose garden that he enjoys tending.  He is also back taking his ADHD medication because it is now back in stock and that has been helpful.  Talked with pt about his FMLA paperwork that is due today to his employer and he asked to have it completed the same way as we did last time.  He shares that work is going OK; he continues to get close to meeting his work targets; with the better his performance standards are, the more money he can make.  He describes the performance evaluation process with [employer] as very extensive.  He is trying to move from Rep 1 level to Rep 2 level that would  equal a raise for him and may eventually allow him to work from home again.  Pt is still working 10 hour days, 4 days per week.  Pt shares that his wife, who is a Runner, broadcasting/film/video Journalist, newspaper), has decided not to work this summer so they will be dependent on his paycheck for the summer.  Her dad passed away last year so she is wanting to take this summer off from work.  Pt shares that, since he talks to customers all day at work, he tends to want to be more quiet in the evenings.  Pt shares that he and his family are going to [facility] in July for a family vacation this year and the boys (64, 57, and 45 yo) are excited about going.  Pt shares that he has attended work daily for the past week.  His boys are expressing some interest in working some over the summer and pt and his wife are supportive of that idea as well.  Pt shares that his oldest son is looking for a new job and has applied for a position at [employer] and pt hopes he can get into the training class for May there.  He is also thinking about going to [facility] for training regarding a trade in the future.  Pt denies any additional panic attacks since our last session.  Pt shares that he is  sleeping better now ("probably about 4-6 hours per night and that is enough for me; I have never really needed that much sleep").  "I feel like there is pressure everywhere in my life right now (i.e., at home, at work, with my parents, etc.).  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  Encouraged pt to be intentional about engaging in his self care activities and we will meet in 2 weeks for a follow up session.  Interventions: Cognitive Behavioral Therapy  Diagnosis:No diagnosis found.  Plan: Treatment Plan Strengths/Abilities:  Intelligent, Intuitive, Willing to participate in therapy Treatment Preferences:  Outpatient Individual Therapy Statement of Needs:  Patient is to use CBT,  mindfulness and coping skills to help manage and/or decrease symptoms associated with their diagnosis. Symptoms:  Depressed/Irritable mood, worry, social withdrawal Problems Addressed:  Depressive thoughts, Sadness, Sleep issues, etc. Long Term Goals:  Pt to reduce overall level, frequency, and intensity of the feelings of depression/anxiety as evidenced by decreased irritability, negative self talk, and helpless feelings from 6 to 7 days/week to 0 to 1 days/week, per client report, for at least 3 consecutive months.  Progress:  Short Term Goals:  Pt to verbally express understanding of the relationship between feelings of depression/anxiety and their impact on thinking patterns and behaviors.  Pt to verbalize an understanding of the role that distorted thinking plays in creating fears, excessive worry, and ruminations.  Progress:  Target Date:  04/24/2024 Frequency:  Bi-weekly Modality:  Cognitive Behavioral Therapy Interventions by Therapist:  Therapist will use CBT, Mindfulness exercises, Coping skills and Referrals, as needed by client. Client has verbally approved this treatment plan.  Karie Kirks, [facility] This encounter was created in error - please disregard.

## 2023-03-25 ENCOUNTER — Ambulatory Visit (INDEPENDENT_AMBULATORY_CARE_PROVIDER_SITE_OTHER): Payer: BC Managed Care – PPO | Admitting: Psychology

## 2023-03-25 DIAGNOSIS — F4323 Adjustment disorder with mixed anxiety and depressed mood: Secondary | ICD-10-CM | POA: Diagnosis not present

## 2023-03-25 NOTE — Progress Notes (Signed)
[facility] Counselor/Therapist Progress Note  Patient ID: ELMUS MCELWEE, MRN: [id number],    Date: 03/25/2023  Time Spent: 45 mins  Treatment Type: Individual Therapy  Reported Symptoms: Pt presents for session via Caregility video.  Pt grants consent for session, stating he is in his home with no one else present.  I shared with pt that I am in my office with no one else here either.  Mental Status Exam: Appearance:  Casual     Behavior: Appropriate  Motor: Normal  Speech/Language:  Clear and Coherent  Affect: Appropriate  Mood: normal  Thought process: normal  Thought content:   WNL  Sensory/Perceptual disturbances:   WNL  Orientation: oriented to person, place, and time/date  Attention: Good  Concentration: Good  Memory: WNL  Fund of knowledge:  Good  Insight:   Good  Judgment:  Good  Impulse Control: Good   Risk Assessment: Danger to Self:  No Self-injurious Behavior: No Danger to Others: No Duty to Warn:no Physical Aggression / Violence:No  Access to Firearms a concern: No  Gang Involvement:No   Subjective: Pt shares that, "I have been OK since our last session; I am just dealing with a lot of stuff.  My wife is out of the hospital now and my mom has been sick as well."  Pt shares his wife has had some male issues and she has some upcoming doctors' appts in the next week to try to get some answers.  She has still not returned to work yet but she is feeling a bit better each day.  Pt shares they did go out for a light dinner last night to get out of the home for a while.  Pt shares that his wife may end up needing a hysterectomy to correct the issues.  She is somewhat anxious about that option.  They are planning a family vacation in July; going to [place] with the boys (16, 17, and 20).  Pt shares that his sons are doing OK; "my oldest is having trouble transitioning to adulthood."  Pt shares he is still struggling to find himself and to determine what he  wants to do with his life.  This is also effecting his younger brothers as well.  The oldest child is pt's wife's biological child, the middle child is pt's and the youngest child is adopted.  Pt continues to help his parents (2 yo and 80 yo) by checking in on them several times per week.  "My wife is the glue that holds all of [place] together; with her having health concerns, that increases the stress for all of [place]."  Pt shares that he has not had opportunity to engage with his self care activities since our last session because of his wife's needs and his mom's needs.  Pt shares that he sees his therapy sessions as self care for him at this time.  Pt shares that work "continues to be pretty much the same.  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  Encouraged pt to be intentional about engaging in his self care activities and we will meet in 2 weeks for a follow up session.  Interventions: Cognitive Behavioral Therapy  Diagnosis:Adjustment disorder with mixed anxiety and depressed mood  Plan: Treatment Plan Strengths/Abilities:  Intelligent, Intuitive, Willing to participate in therapy Treatment Preferences:  Outpatient Individual Therapy Statement of Needs:  Patient is to use CBT, mindfulness and coping skills to help manage and/or decrease symptoms associated with their diagnosis. Symptoms:  Depressed/Irritable mood, worry, social withdrawal Problems Addressed:  Depressive thoughts, Sadness, Sleep issues, etc. Long Term Goals:  Pt to reduce overall level, frequency, and intensity of the feelings of depression/anxiety as evidenced by decreased irritability, negative self talk, and helpless feelings from 6 to 7 days/week to 0 to 1 days/week, per client report, for at least 3 consecutive months.  Progress: 30% Short Term Goals:  Pt to verbally express  understanding of the relationship between feelings of depression/anxiety and their impact on thinking patterns and behaviors.  Pt to verbalize an understanding of the role that distorted thinking plays in creating fears, excessive worry, and ruminations.  Progress: 30% Target Date:  04/24/2024 Frequency:  Bi-weekly Modality:  Cognitive Behavioral Therapy Interventions by Therapist:  Therapist will use CBT, Mindfulness exercises, Coping skills and Referrals, as needed by client. Client has verbally approved this treatment plan.  Karie Kirks, [facility]

## 2023-04-08 ENCOUNTER — Ambulatory Visit (INDEPENDENT_AMBULATORY_CARE_PROVIDER_SITE_OTHER): Payer: BC Managed Care – PPO | Admitting: Psychology

## 2023-04-08 DIAGNOSIS — F4323 Adjustment disorder with mixed anxiety and depressed mood: Secondary | ICD-10-CM

## 2023-04-08 NOTE — Progress Notes (Signed)
[facility] Counselor/Therapist Progress Note  Patient ID: JAYDIS WILLIAM, MRN: [id number],    Date: 04/08/2023  Time Spent: 30 mins  Treatment Type: Individual Therapy  Reported Symptoms: Pt presents for session via Caregility video.  Pt grants consent for session, stating he is in his car with no one else present.  I shared with pt that I am in my office with no one else here either.  Mental Status Exam: Appearance:  Casual     Behavior: Appropriate  Motor: Normal  Speech/Language:  Clear and Coherent  Affect: Appropriate  Mood: normal  Thought process: normal  Thought content:   WNL  Sensory/Perceptual disturbances:   WNL  Orientation: oriented to person, place, and time/date  Attention: Good  Concentration: Good  Memory: WNL  Fund of knowledge:  Good  Insight:   Good  Judgment:  Good  Impulse Control: Good   Risk Assessment: Danger to Self:  No Self-injurious Behavior: No Danger to Others: No Duty to Warn:no Physical Aggression / Violence:No  Access to Firearms a concern: No  Gang Involvement:No   Subjective: Pt shares that, "I have been about the same since our last session; my wife is still waiting to get her surgery scheduled for her male issues.  She had to go back work and that is working OK because her issues tend to come and go; she is able to manage the day since the teachers are winding down their work so there is less for my wife to do."  Pt shares that his work is going OK; he shares he believes that he missed the productivity target again by about two tenths of a point.  This does not threaten his position at all; he just missed getting a bonus for the month.  Pt shares "It is OK; I still have a job and I still get a check so it is all good."  Pt shares he has a meeting with his supervisor tomorrow morning and they will be talking about his productivity.  He believes this issue will be fine and will not have a negative impact on his job.  Pt shares  that he has been trying to rest better and to stay productive (keeping up with chores at home since his wife is not feeling well).  The kids finished school yesterday and that takes some pressure off of him.  They are still planning a family vacation in July; going to [place] with the boys (16, 17, and 20).  "My wife is adamant about [place] all going on this vacation; she said we will just have to move the surgery around to fit the vacation schedule."  Pt shares that his mom and dad are doing well; he just saw them yesterday.  Pt is still having difficulty engaging in his own self care activities because of the concerns for his wife since our last session.  Pt had to limit this session to 30 mins so he can help his son get ready for a dentist appt.  Encouraged pt to be intentional about engaging in his self care activities and we will meet in 3 weeks for a follow up session.  Interventions: Cognitive Behavioral Therapy  Diagnosis:Adjustment disorder with mixed anxiety and depressed mood  Plan: Treatment Plan Strengths/Abilities:  Intelligent, Intuitive, Willing to participate in therapy Treatment Preferences:  Outpatient Individual Therapy Statement of Needs:  Patient is to use CBT, mindfulness and coping skills to help manage and/or decrease symptoms associated with their diagnosis. Symptoms:  Depressed/Irritable mood, worry, social withdrawal Problems Addressed:  Depressive thoughts, Sadness, Sleep issues, etc. Long Term Goals:  Pt to reduce overall level, frequency, and intensity of the feelings of depression/anxiety as evidenced by decreased irritability, negative self talk, and helpless feelings from 6 to 7 days/week to 0 to 1 days/week, per client report, for at least 3 consecutive months.  Progress: 30% Short Term Goals:  Pt to verbally express understanding of the relationship between feelings of depression/anxiety and their impact on thinking patterns and behaviors.  Pt to verbalize an  understanding of the role that distorted thinking plays in creating fears, excessive worry, and ruminations.  Progress: 30% Target Date:  04/24/2024 Frequency:  Bi-weekly Modality:  Cognitive Behavioral Therapy Interventions by Therapist:  Therapist will use CBT, Mindfulness exercises, Coping skills and Referrals, as needed by client. Client has verbally approved this treatment plan.  Karie Kirks, [facility]

## 2023-04-29 ENCOUNTER — Ambulatory Visit (INDEPENDENT_AMBULATORY_CARE_PROVIDER_SITE_OTHER): Payer: BC Managed Care – PPO | Admitting: Psychology

## 2023-04-29 DIAGNOSIS — F4323 Adjustment disorder with mixed anxiety and depressed mood: Secondary | ICD-10-CM | POA: Diagnosis not present

## 2023-04-29 DIAGNOSIS — M7652 Patellar tendinitis, left knee: Secondary | ICD-10-CM | POA: Diagnosis not present

## 2023-04-29 DIAGNOSIS — M778 Other enthesopathies, not elsewhere classified: Secondary | ICD-10-CM | POA: Diagnosis not present

## 2023-04-29 NOTE — Progress Notes (Signed)
[facility] Counselor/Therapist Progress Note  Patient ID: [name], MRN: [id number],    Date: 04/29/2023  Time Spent: 45 mins; start time: 1104; end time: 1148  Treatment Type: Individual Therapy  Reported Symptoms: Pt presents for session via Caregility video.  Pt grants consent for session, stating he is in his home with no one else present; pt also shares that he understands the limitations of virtual session.  I shared with pt that I am in my office with no one else here either.  Mental Status Exam: Appearance:  Casual     Behavior: Appropriate  Motor: Normal  Speech/Language:  Clear and Coherent  Affect: Appropriate  Mood: normal  Thought process: normal  Thought content:   WNL  Sensory/Perceptual disturbances:   WNL  Orientation: oriented to person, place, and time/date  Attention: Good  Concentration: Good  Memory: WNL  Fund of knowledge:  Good  Insight:   Good  Judgment:  Good  Impulse Control: Good   Risk Assessment: Danger to Self:  No Self-injurious Behavior: No Danger to Others: No Duty to Warn:no Physical Aggression / Violence:No  Access to Firearms a concern: No  Gang Involvement:No   Subjective: Pt shares that, "I have been OK since our last session; in terms of work and home, things are about the same; nothing too stressful.  My wife is doing some better but she is frustrated with doctor's office because they did not schedule her surgery until mid to late August and her 6 wk recovery time would go into the next school year."  Pt shares that work is going about the same as before; he did get the opportunity to change his schedule be be off on Sat, Sun, and Mon off, beginning next week; his new schedule will be effective for 3 months (July, August and Sept).  "Everything is a benefit of employee productivity"; pt shares that schedule choices are based on his ability to produce at the level the company sets for him.  He has FMLA coverage and will  use that to continue our sessions before he goes to work.  Pt shares that he has been having issues with his knee for the past couple of weeks and is going to an Ortho this afternoon for an evaluation.  Pt continues to plan for their vacation to [place] on 7/13; pt is looking forward to being able to go and spend time with his wife and the boys; the boys are old enough now (16, 7, and 45 yo) to do some things on their own.  Talked with pt about the benefits of balance in our lives and to be intentional in that pursuits.  Pt shares that his parents are doing well lately; his father picked him up from work on Monday and they were able to talk some on the way home.  His dad still works as the IT sales professional person for [employer].  Pt shares that he has not been focused on his self care activities since our last session; he has been more focused on his wife and her health.  He has not been working on his roses since his knee has been bothering him and with the heat as well.  He shares that the boys are not yet working this summer and he is not sure they will get jobs because of their vacation coming up. Encouraged pt to be intentional about engaging in his self care activities and we will meet in 3 weeks for  a follow up session.  Interventions: Cognitive Behavioral Therapy  Diagnosis:Adjustment disorder with mixed anxiety and depressed mood  Plan: Treatment Plan Strengths/Abilities:  Intelligent, Intuitive, Willing to participate in therapy Treatment Preferences:  Outpatient Individual Therapy Statement of Needs:  Patient is to use CBT, mindfulness and coping skills to help manage and/or decrease symptoms associated with their diagnosis. Symptoms:  Depressed/Irritable mood, worry, social withdrawal Problems Addressed:  Depressive thoughts, Sadness, Sleep issues, etc. Long Term Goals:  Pt to reduce overall level, frequency, and intensity of the feelings of depression/anxiety as evidenced by decreased  irritability, negative self talk, and helpless feelings from 6 to 7 days/week to 0 to 1 days/week, per client report, for at least 3 consecutive months.  Progress: 30% Short Term Goals:  Pt to verbally express understanding of the relationship between feelings of depression/anxiety and their impact on thinking patterns and behaviors.  Pt to verbalize an understanding of the role that distorted thinking plays in creating fears, excessive worry, and ruminations.  Progress: 30% Target Date:  04/24/2024 Frequency:  Bi-weekly Modality:  Cognitive Behavioral Therapy Interventions by Therapist:  Therapist will use CBT, Mindfulness exercises, Coping skills and Referrals, as needed by client. Client has verbally approved this treatment plan.  Karie Kirks, [facility]

## 2023-05-11 DIAGNOSIS — M6281 Muscle weakness (generalized): Secondary | ICD-10-CM | POA: Diagnosis not present

## 2023-05-11 DIAGNOSIS — M7652 Patellar tendinitis, left knee: Secondary | ICD-10-CM | POA: Diagnosis not present

## 2023-05-13 ENCOUNTER — Ambulatory Visit: Payer: BC Managed Care – PPO | Admitting: Psychology

## 2023-05-14 ENCOUNTER — Ambulatory Visit (INDEPENDENT_AMBULATORY_CARE_PROVIDER_SITE_OTHER): Payer: BC Managed Care – PPO | Admitting: Psychology

## 2023-05-14 DIAGNOSIS — F4323 Adjustment disorder with mixed anxiety and depressed mood: Secondary | ICD-10-CM

## 2023-05-14 NOTE — Progress Notes (Signed)
[facility] Counselor/Therapist Progress Note  Patient ID: [name], MRN: [id number],    Date: 05/14/2023  Time Spent: 45 mins; start time: 1400; end time: 1445  Treatment Type: Individual Therapy  Reported Symptoms: Pt presents for session via Caregility video.  Pt grants consent for session, stating he is in his home with no one else present; pt also shares that he understands the limitations of virtual session.  I shared with pt that I am in my office with no one else here either.  Mental Status Exam: Appearance:  Casual     Behavior: Appropriate  Motor: Normal  Speech/Language:  Clear and Coherent  Affect: Appropriate  Mood: normal  Thought process: normal  Thought content:   WNL  Sensory/Perceptual disturbances:   WNL  Orientation: oriented to person, place, and time/date  Attention: Good  Concentration: Good  Memory: WNL  Fund of knowledge:  Good  Insight:   Good  Judgment:  Good  Impulse Control: Good   Risk Assessment: Danger to Self:  No Self-injurious Behavior: No Danger to Others: No Duty to Warn:no Physical Aggression / Violence:No  Access to Firearms a concern: No  Gang Involvement:No   Subjective: Pt shares that, "I have been OK since our last session; we are almost ready for our vacation.  Our flight leaves about 6am on Saturday (7/13) and we will be in [place] by lunch time."  Pt shares his mother-in-law and sister-in-law are going with them on vacation.  Pt shares he will be off of work for 10 days because the way his work schedule falls and the time they will be in [place] (7 days).  The kids are excited about the vacation coming.  Pt is working Tues-Sat (10 hr days) and it is going well.  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  Pt shares that his wife is feeling OK and is on medication to help her feel  better; they have finally scheduled her surgery for late August.  Pt shares that he did see his Ortho regarding his knee pain and referred him to PT; the PT believes his issue is with his hip and it is effecting his knee; he has exercises to do to strengthen his hip.  Pt shares his oldest son has a job at [employer] and he is enjoying the money he is making.  He is trying to work with all three kids about financial literacy and help them understand the best ways to manage their money.  Encouraged pt to be intentional about engaging in his self care activities and we will meet in 2 weeks for a follow up session.  Interventions: Cognitive Behavioral Therapy  Diagnosis:Adjustment disorder with mixed anxiety and depressed mood  Plan: Treatment Plan Strengths/Abilities:  Intelligent, Intuitive, Willing to participate in therapy Treatment Preferences:  Outpatient Individual Therapy Statement of Needs:  Patient is to use CBT, mindfulness and coping skills to help manage and/or decrease symptoms associated with their diagnosis. Symptoms:  Depressed/Irritable mood, worry, social withdrawal Problems Addressed:  Depressive thoughts, Sadness, Sleep issues, etc. Long Term Goals:  Pt to reduce overall level, frequency, and intensity of the feelings of depression/anxiety as evidenced by decreased irritability, negative self talk, and helpless feelings from 6 to 7 days/week to 0 to 1 days/week, per client report, for at least 3 consecutive months.  Progress: 30% Short Term Goals:  Pt to verbally express understanding of the relationship between feelings of depression/anxiety and their impact on thinking patterns and behaviors.  Pt to verbalize an understanding of the role that distorted thinking plays in creating fears, excessive worry, and ruminations.  Progress: 30% Target Date:  04/24/2024 Frequency:  Bi-weekly Modality:  Cognitive Behavioral Therapy Interventions by Therapist:  Therapist will use CBT, Mindfulness  exercises, Coping skills and Referrals, as needed by client. Client has verbally approved this treatment plan.  Karie Kirks, [facility]

## 2023-05-29 ENCOUNTER — Ambulatory Visit: Payer: BC Managed Care – PPO | Admitting: Psychology

## 2023-06-16 ENCOUNTER — Ambulatory Visit: Payer: BC Managed Care – PPO | Admitting: Psychology

## 2023-06-25 ENCOUNTER — Ambulatory Visit (INDEPENDENT_AMBULATORY_CARE_PROVIDER_SITE_OTHER): Payer: BC Managed Care – PPO | Admitting: Psychology

## 2023-06-25 DIAGNOSIS — F4323 Adjustment disorder with mixed anxiety and depressed mood: Secondary | ICD-10-CM

## 2023-06-25 NOTE — Progress Notes (Signed)
[facility] Counselor/Therapist Progress Note  Patient ID: [name], MRN: [id number],    Date: 06/25/2023  Time Spent: 45 mins; start time: 0800; end time: 0845  Treatment Type: Individual Therapy  Reported Symptoms: Pt presents for session via Caregility video.  Pt grants consent for session, stating he is in his home with no one else present; pt also shares that he understands the limitations of virtual session.  I shared with pt that I am in my office with no one else here either.  Mental Status Exam: Appearance:  Casual     Behavior: Appropriate  Motor: Normal  Speech/Language:  Clear and Coherent  Affect: Appropriate  Mood: normal  Thought process: normal  Thought content:   WNL  Sensory/Perceptual disturbances:   WNL  Orientation: oriented to person, place, and time/date  Attention: Good  Concentration: Good  Memory: WNL  Fund of knowledge:  Good  Insight:   Good  Judgment:  Good  Impulse Control: Good   Risk Assessment: Danger to Self:  No Self-injurious Behavior: No Danger to Others: No Duty to Warn:no Physical Aggression / Violence:No  Access to Firearms a concern: No  Gang Involvement:No   Subjective: Pt shares that, "I have been OK since our last session; our trip to [place] was great; we celebrated our 9th wedding anniversary while we were there.  We got caught in the international computer break down on the way back and our flight got cancelled.  We had to find a rental car and had to drive back from [place].  It was just crazy in the airport when all of that was going on."  Pt shares that he did not give his kids spending money on vacation and let them do chores for them and for their grandmother to earn their money and that was good for all of them.  Pt shares that work is going pretty good; "I found out Tuesday that I met all of the requirements to work from home; I am not sure if they are going to allow any of [place] to work from home but they  will let us know by 9/10."  Pt shares that he and his manager were meeting recently and his boss' boss came and joined them and they all had a nice conversation and he enjoyed the opportunity to provide some feedback for management.  Pt shares that his wife is doing OK and is scheduled to have her procedure on Tuesday of next week.  She is a bit anxious about the surgery and he is worried too but is trying to stay strong for her.  She will stay overnight and come home the next day.  Her recovery time should be about 6 wks.  Pt shares that he is still doing the PT exercises for his knee pain and he is having good results from the exercises and he is having less pain now.  His oldest son is still enjoying his job at [employer]; pt shares that [employer] has opened up some other new positions and pt is planning to talk with his son to see if he wants to apply for one of those again.  Encouraged pt to be intentional about engaging in his self care activities and we will meet in 2 weeks for a follow up session.  Interventions: Cognitive Behavioral Therapy  Diagnosis:Adjustment disorder with mixed anxiety and depressed mood  Plan: Treatment Plan Strengths/Abilities:  Intelligent, Intuitive, Willing to participate in therapy Treatment Preferences:  Outpatient Individual  Therapy Statement of Needs:  Patient is to use CBT, mindfulness and coping skills to help manage and/or decrease symptoms associated with their diagnosis. Symptoms:  Depressed/Irritable mood, worry, social withdrawal Problems Addressed:  Depressive thoughts, Sadness, Sleep issues, etc. Long Term Goals:  Pt to reduce overall level, frequency, and intensity of the feelings of depression/anxiety as evidenced by decreased irritability, negative self talk, and helpless feelings from 6 to 7 days/week to 0 to 1 days/week, per client report, for at least 3 consecutive months.  Progress: 30% Short Term Goals:  Pt to verbally express understanding of the  relationship between feelings of depression/anxiety and their impact on thinking patterns and behaviors.  Pt to verbalize an understanding of the role that distorted thinking plays in creating fears, excessive worry, and ruminations.  Progress: 30% Target Date:  04/24/2024 Frequency:  Bi-weekly Modality:  Cognitive Behavioral Therapy Interventions by Therapist:  Therapist will use CBT, Mindfulness exercises, Coping skills and Referrals, as needed by client. Client has verbally approved this treatment plan.  Karie Kirks, [facility]

## 2023-06-27 DIAGNOSIS — G4733 Obstructive sleep apnea (adult) (pediatric): Secondary | ICD-10-CM | POA: Diagnosis not present

## 2023-07-09 ENCOUNTER — Ambulatory Visit: Payer: BC Managed Care – PPO | Admitting: Psychology

## 2023-07-14 ENCOUNTER — Ambulatory Visit (INDEPENDENT_AMBULATORY_CARE_PROVIDER_SITE_OTHER): Payer: BC Managed Care – PPO | Admitting: Psychology

## 2023-07-14 DIAGNOSIS — F4323 Adjustment disorder with mixed anxiety and depressed mood: Secondary | ICD-10-CM | POA: Diagnosis not present

## 2023-07-14 NOTE — Progress Notes (Signed)
[facility] Counselor/Therapist Progress Note  Patient ID: ZEF BRUSSO, MRN: [id number],    Date: 07/14/2023  Time Spent: 45 mins; start time: 1400; end time: 1445  Treatment Type: Individual Therapy  Reported Symptoms: Pt presents for session via Caregility video.  Pt grants consent for session, stating he is in his car with no one else present; pt also shares that he understands the limitations of virtual session.  I shared with pt that I am in my office with no one else here either.  Mental Status Exam: Appearance:  Casual     Behavior: Appropriate  Motor: Normal  Speech/Language:  Clear and Coherent  Affect: Appropriate  Mood: normal  Thought process: normal  Thought content:   WNL  Sensory/Perceptual disturbances:   WNL  Orientation: oriented to person, place, and time/date  Attention: Good  Concentration: Good  Memory: WNL  Fund of knowledge:  Good  Insight:   Good  Judgment:  Good  Impulse Control: Good   Risk Assessment: Danger to Self:  No Self-injurious Behavior: No Danger to Others: No Duty to Warn:no Physical Aggression / Violence:No  Access to Firearms a concern: No  Gang Involvement:No   Subjective: Pt shares that, "I have been all over the place since our last session.  My wife had surgery and she has been recovering for three weeks now.  She was nervous about the surgery and that was stressful for all of [place].  My oldest son decided that he wanted to fix up our older car and keep it for his own so that was good.  We helped him get it fixed and he is going to pay [place] back for the repairs over the next 3 checks.  He is still working at [employer] shares that he is still waiting to hear if he gets the opportunity to begin working from home again; he hopes to find out in the next week or so.  Pt shares that his wife is coming along with her recovery from her surgery but she is still having some pain.  He is trying to stay as positive as he can for  himself, his wife, and the boys.  Pt shares that he has not had any other trouble with his knee and he is happy with that.  Pt shares that he has not been able to engage in self care activities such as walking and reading, but not on a regular basis.  He has tried to get in bed at a regular hour as well.  Encouraged pt to be intentional about engaging in his self care activities and we will meet in 2 weeks for a follow up session.  Interventions: Cognitive Behavioral Therapy  Diagnosis:Adjustment disorder with mixed anxiety and depressed mood  Plan: Treatment Plan Strengths/Abilities:  Intelligent, Intuitive, Willing to participate in therapy Treatment Preferences:  Outpatient Individual Therapy Statement of Needs:  Patient is to use CBT, mindfulness and coping skills to help manage and/or decrease symptoms associated with their diagnosis. Symptoms:  Depressed/Irritable mood, worry, social withdrawal Problems Addressed:  Depressive thoughts, Sadness, Sleep issues, etc. Long Term Goals:  Pt to reduce overall level, frequency, and intensity of the feelings of depression/anxiety as evidenced by decreased irritability, negative self talk, and helpless feelings from 6 to 7 days/week to 0 to 1 days/week, per client report, for at least 3 consecutive months.  Progress: 30% Short Term Goals:  Pt to verbally express understanding of the relationship between feelings of depression/anxiety and  their impact on thinking patterns and behaviors.  Pt to verbalize an understanding of the role that distorted thinking plays in creating fears, excessive worry, and ruminations.  Progress: 30% Target Date:  04/24/2024 Frequency:  Bi-weekly Modality:  Cognitive Behavioral Therapy Interventions by Therapist:  Therapist will use CBT, Mindfulness exercises, Coping skills and Referrals, as needed by client. Client has verbally approved this treatment plan.  Karie Kirks, [facility]

## 2023-07-28 DIAGNOSIS — G4733 Obstructive sleep apnea (adult) (pediatric): Secondary | ICD-10-CM | POA: Diagnosis not present

## 2023-07-29 ENCOUNTER — Ambulatory Visit (INDEPENDENT_AMBULATORY_CARE_PROVIDER_SITE_OTHER): Payer: BC Managed Care – PPO | Admitting: Psychology

## 2023-07-29 DIAGNOSIS — F4323 Adjustment disorder with mixed anxiety and depressed mood: Secondary | ICD-10-CM | POA: Diagnosis not present

## 2023-07-29 NOTE — Progress Notes (Signed)
[facility] Counselor/Therapist Progress Note  Patient ID: [name], MRN: [id number],    Date: 07/29/2023  Time Spent: 45 mins; start time: 1100; end time: 1145  Treatment Type: Individual Therapy  Reported Symptoms: Pt presents for session via Careg[employer] video.  Pt grants consent for session, stating he is in his car with no one else present; pt also shares that he understands the limitations of virtual session.  I shared with pt that I am in my office with no one else here either.  Mental Status Exam: Appearance:  Casual     Behavior: Appropriate  Motor: Normal  Speech/Language:  Clear and Coherent  Affect: Appropriate  Mood: normal  Thought process: normal  Thought content:   WNL  Sensory/Perceptual disturbances:   WNL  Orientation: oriented to person, place, and time/date  Attention: Good  Concentration: Good  Memory: WNL  Fund of knowledge:  Good  Insight:   Good  Judgment:  Good  Impulse Control: Good   Risk Assessment: Danger to Self:  No Self-injurious Behavior: No Danger to Others: No Duty to Warn:no Physical Aggression / Violence:No  Access to Firearms a concern: No  Gang Involvement:No   Subjective: Pt shares that, "I have been OK since we last talked; there have just been a lot going on.  My son lost his job at [employer] but it wasn't his fault; they closed the department that he worked in.  He has found a new job at [employer] that he is supposed to start at the end of the month; he will be working from [employer] to [employer] (3rd shift)."  Pt shares that his son (20 yo-wife's biological son) "has been difficult to deal with recently as well; he talked to [place] over the weekend and told us that he felt isolated and that he wanted to spend more time with [place].  Pt shares that his wife is doing better; she is still out of work and has the follow up appt with the surgeon next week.  The other boys are a Psychiatric nurse in [employer] this year.  Pt shares that he is still on the  preferred schedule (off on F, Sat, and Sun); he is also in line to work from home but the company has not enacted that program again yet.  Pt shares that he has a wound on his leg and he is going to the Wound Care center on 10/17; he had a previous wound a couple of years ago that was similar.  His knee is better and the PT was helpful.  Pt has a new PCP and has a new pt appt with him on 10/5.  Pt shares that his sleep has been pretty good of late.  Pt shares that he has been spending some time just by himself of late to decompress and feels that has been helpful for pt.  Encouraged pt to be intentional about engaging in his self care activities and we will meet in 2 weeks for a follow up session.  Interventions: Cognitive Behavioral Therapy  Diagnosis:Adjustment disorder with mixed anxiety and depressed mood  Plan: Treatment Plan Strengths/Abilities:  Intelligent, Intuitive, Willing to participate in therapy Treatment Preferences:  Outpatient Individual Therapy Statement of Needs:  Patient is to use CBT, mindfulness and coping skills to help manage and/or decrease symptoms associated with their diagnosis. Symptoms:  Depressed/Irritable mood, worry, social withdrawal Problems Addressed:  Depressive thoughts, Sadness, Sleep issues, etc. Long Term Goals:  Pt to reduce overall level,  frequency, and intensity of the feelings of depression/anxiety as evidenced by decreased irritability, negative self talk, and helpless feelings from 6 to 7 days/week to 0 to 1 days/week, per client report, for at least 3 consecutive months.  Progress: 30% Short Term Goals:  Pt to verbally express understanding of the relationship between feelings of depression/anxiety and their impact on thinking patterns and behaviors.  Pt to verbalize an understanding of the role that distorted thinking plays in creating fears, excessive worry, and ruminations.  Progress: 30% Target Date:  04/24/2024 Frequency:  Bi-weekly Modality:   Cognitive Behavioral Therapy Interventions by Therapist:  Therapist will use CBT, Mindfulness exercises, Coping skills and Referrals, as needed by client. Client has verbally approved this treatment plan.  Karie Kirks, [facility]

## 2023-08-04 ENCOUNTER — Other Ambulatory Visit: Payer: Self-pay

## 2023-08-04 ENCOUNTER — Emergency Department
Admission: EM | Admit: 2023-08-04 | Discharge: 2023-08-04 | Disposition: A | Discharge status: Home / Self Care | Payer: BC Managed Care – PPO | Attending: Emergency Medicine | Admitting: Emergency Medicine

## 2023-08-04 ENCOUNTER — Emergency Department: Payer: BC Managed Care – PPO

## 2023-08-04 DIAGNOSIS — R000 Tachycardia, unspecified: Secondary | ICD-10-CM | POA: Diagnosis not present

## 2023-08-04 DIAGNOSIS — X58XXXA Exposure to other specified factors, initial encounter: Secondary | ICD-10-CM | POA: Diagnosis not present

## 2023-08-04 DIAGNOSIS — S8991XA Unspecified injury of right lower leg, initial encounter: Secondary | ICD-10-CM | POA: Diagnosis not present

## 2023-08-04 DIAGNOSIS — E119 Type 2 diabetes mellitus without complications: Secondary | ICD-10-CM | POA: Diagnosis not present

## 2023-08-04 DIAGNOSIS — I10 Essential (primary) hypertension: Secondary | ICD-10-CM | POA: Insufficient documentation

## 2023-08-04 DIAGNOSIS — L03115 Cellulitis of right lower limb: Secondary | ICD-10-CM | POA: Insufficient documentation

## 2023-08-04 DIAGNOSIS — S81801A Unspecified open wound, right lower leg, initial encounter: Secondary | ICD-10-CM | POA: Diagnosis not present

## 2023-08-04 DIAGNOSIS — M7989 Other specified soft tissue disorders: Secondary | ICD-10-CM | POA: Diagnosis not present

## 2023-08-04 LAB — CBC WITH DIFFERENTIAL/PLATELET
Abs Immature Granulocytes: 0.08 10*3/uL — ABNORMAL HIGH (ref 0.00–0.07)
Basophils Absolute: 0.1 10*3/uL (ref 0.0–0.1)
Basophils Relative: 1 %
Eosinophils Absolute: 0 10*3/uL (ref 0.0–0.5)
Eosinophils Relative: 1 %
HCT: 37 % — ABNORMAL LOW (ref 39.0–52.0)
Hemoglobin: 13.3 g/dL (ref 13.0–17.0)
Immature Granulocytes: 2 %
Lymphocytes Relative: 23 %
Lymphs Abs: 1.1 10*3/uL (ref 0.7–4.0)
MCH: 29 pg (ref 26.0–34.0)
MCHC: 35.9 g/dL (ref 30.0–36.0)
MCV: 80.6 fL (ref 80.0–100.0)
Monocytes Absolute: 0.4 10*3/uL (ref 0.1–1.0)
Monocytes Relative: 9 %
Neutro Abs: 3 10*3/uL (ref 1.7–7.7)
Neutrophils Relative %: 64 %
Platelets: 204 10*3/uL (ref 150–400)
RBC: 4.59 MIL/uL (ref 4.22–5.81)
RDW: 12.8 % (ref 11.5–15.5)
WBC: 4.6 10*3/uL (ref 4.0–10.5)
nRBC: 0 % (ref 0.0–0.2)

## 2023-08-04 LAB — BASIC METABOLIC PANEL
Anion gap: 10 (ref 5–15)
BUN: 25 mg/dL — ABNORMAL HIGH (ref 6–20)
CO2: 26 mmol/L (ref 22–32)
Calcium: 8.3 mg/dL — ABNORMAL LOW (ref 8.9–10.3)
Chloride: 100 mmol/L (ref 98–111)
Creatinine, Ser: 1.38 mg/dL — ABNORMAL HIGH (ref 0.61–1.24)
GFR, Estimated: >60 mL/min (ref >60)
Glucose, Bld: 150 mg/dL — ABNORMAL HIGH (ref 70–99)
Potassium: 2.7 mmol/L — CL (ref 3.5–5.1)
Sodium: 136 mmol/L (ref 135–145)

## 2023-08-04 LAB — MAGNESIUM: Magnesium: 1.6 mg/dL — ABNORMAL LOW (ref 1.7–2.4)

## 2023-08-04 MED ORDER — MAGNESIUM OXIDE -MG SUPPLEMENT 400 (240 MG) MG PO TABS
800.0000 mg | ORAL_TABLET | Freq: Once | ORAL | Status: AC
  Administered 2023-08-04: 800 mg via ORAL
  Filled 2023-08-04: qty 2

## 2023-08-04 MED ORDER — CEPHALEXIN 500 MG PO CAPS
500.0000 mg | ORAL_CAPSULE | Freq: Four times a day (QID) | ORAL | 0 refills | Status: DC
Start: 2023-08-04 — End: 2023-08-06

## 2023-08-04 MED ORDER — SULFAMETHOXAZOLE-TRIMETHOPRIM 800-160 MG PO TABS
1.0000 | ORAL_TABLET | Freq: Once | ORAL | Status: AC
  Administered 2023-08-04: 1 via ORAL
  Filled 2023-08-04: qty 1

## 2023-08-04 MED ORDER — IRBESARTAN 150 MG PO TABS
150.0000 mg | ORAL_TABLET | Freq: Once | ORAL | Status: AC
  Administered 2023-08-04: 150 mg via ORAL
  Filled 2023-08-04: qty 1

## 2023-08-04 MED ORDER — MAGNESIUM OXIDE 400 MG PO TABS: 400.0000 mg | ORAL_TABLET | Freq: Every day | ORAL | 0 refills | Status: AC

## 2023-08-04 MED ORDER — OXYCODONE-ACETAMINOPHEN 5-325 MG PO TABS
1.0000 | ORAL_TABLET | Freq: Once | ORAL | Status: AC
  Administered 2023-08-04: 1 via ORAL
  Filled 2023-08-04: qty 1

## 2023-08-04 MED ORDER — POTASSIUM CHLORIDE CRYS ER 20 MEQ PO TBCR
40.0000 meq | EXTENDED_RELEASE_TABLET | Freq: Once | ORAL | Status: AC
  Administered 2023-08-04: 40 meq via ORAL
  Filled 2023-08-04: qty 2

## 2023-08-04 MED ORDER — VALSARTAN 160 MG PO TABS: 160.0000 mg | ORAL_TABLET | Freq: Every day | ORAL | 1 refills | Status: DC

## 2023-08-04 MED ORDER — POTASSIUM CHLORIDE CRYS ER 20 MEQ PO TBCR: 20.0000 meq | EXTENDED_RELEASE_TABLET | Freq: Every day | ORAL | 0 refills | Status: DC

## 2023-08-04 MED ORDER — SULFAMETHOXAZOLE-TRIMETHOPRIM 800-160 MG PO TABS: 1.0000 | ORAL_TABLET | Freq: Two times a day (BID) | ORAL | 0 refills | Status: DC

## 2023-08-04 MED ORDER — CEPHALEXIN 500 MG PO CAPS
500.0000 mg | ORAL_CAPSULE | Freq: Once | ORAL | Status: AC
  Administered 2023-08-04: 500 mg via ORAL
  Filled 2023-08-04: qty 1

## 2023-08-04 NOTE — ED Provider Notes (Signed)
[facility] Provider Note    Event Date/Time   First MD Initiated Contact with Patient 08/04/23 [phone]     (approximate)   History   Chief Complaint Leg Injury   HPI  [name] is a 47 y.o. male with past medical history of hypertension, diabetes, and migraines who presents to the ED complaining of leg wound.  Patient reports that over the past week he has noticed a spot on his right lower leg that has gotten increasingly red and swollen with drainage of pus.  He states that it has been painful to touch and painful when he goes to bear weight on the leg.  He denies any traumatic injuries to the leg and has not had any fevers.  He describes symptoms as similar to when he dealt with a wound to his left lower leg, previously followed at the wound care clinic for this.  He attempted to schedule follow-up with wound care today, but was told he could not be seen until October 17.  He does report that he has an appointment to see a new PCP in 2 days, had recently run out of his blood pressure medication.     Physical Exam   Triage Vital Signs: ED Triage Vitals  Encounter Vitals Group     BP 08/04/23 0751 (!) 184/125     Systolic BP Percentile --      Diastolic BP Percentile --      Pulse Rate 08/04/23 0751 (!) 109     Resp 08/04/23 0751 16     Temp 08/04/23 0751 98.3 F (36.8 C)     Temp Source 08/04/23 0751 Oral     SpO2 08/04/23 0751 96 %     Weight 08/04/23 0752 (!) 337 lb 11.9 oz (153.2 kg)     Height 08/04/23 0752 5\' 9"  (1.753 m)     Head Circumference --      Peak Flow --      Pain Score 08/04/23 0752 3     Pain Loc --      Pain Education --      Exclude from Growth Chart --     Most recent vital signs: Vitals:   08/04/23 0930 08/04/23 1000  BP: (!) 186/124 (!) 166/108  Pulse: (!) 103 (!) 102  Resp: 14 19  Temp:    SpO2: 98% 96%    Constitutional: Alert and oriented. Eyes: Conjunctivae are normal. Head: Atraumatic. Nose: No  congestion/rhinnorhea. Mouth/Throat: Mucous membranes are moist.  Cardiovascular: Normal rate, regular rhythm. Grossly normal heart sounds.  2+ radial and DP pulses bilaterally. Respiratory: Normal respiratory effort.  No retractions. Lungs CTAB. Gastrointestinal: Soft and nontender. No distention. Musculoskeletal: Open wound to right anterior lower leg with central purulence but no focal fluctuance.  There is surrounding erythema, edema, warmth, and tenderness. Neurologic:  Normal speech and language. No gross focal neurologic deficits are appreciated.    ED Results / Procedures / Treatments   Labs (all labs ordered are listed, but only abnormal results are displayed) Labs Reviewed  CBC WITH DIFFERENTIAL/PLATELET - Abnormal; Notable for the following components:      Result Value   HCT 37.0 (*)    Abs Immature Granulocytes 0.08 (*)    All other components within normal limits  BASIC METABOLIC PANEL - Abnormal; Notable for the following components:   Potassium 2.7 (*)    Glucose, Bld 150 (*)    BUN 25 (*)    Creatinine,  Ser 1.38 (*)    Calcium 8.3 (*)    All other components within normal limits  MAGNESIUM - Abnormal; Notable for the following components:   Magnesium 1.6 (*)    All other components within normal limits     EKG  ED ECG REPORT I, Chesley Noon, the attending physician, personally viewed and interpreted this ECG.   Date: 08/04/2023  EKG Time: 8:42  Rate: 98  Rhythm: normal sinus rhythm  Axis: Normal  Intervals:none  ST&T Change: None  RADIOLOGY Right tib-fib x-ray reviewed and interpreted by me with soft tissue swelling, no evidence of osteomyelitis.  PROCEDURES:  Critical Care performed: No  Procedures   MEDICATIONS ORDERED IN ED: Medications  magnesium oxide (MAG-OX) tablet 800 mg (has no administration in time range)  oxyCODONE-acetaminophen (PERCOCET/ROXICET) 5-325 MG per tablet 1 tablet (1 tablet Oral Given 08/04/23 0839)  cephALEXin  (KEFLEX) capsule 500 mg (500 mg Oral Given 08/04/23 0839)  sulfamethoxazole-trimethoprim (BACTRIM DS) 800-160 MG per tablet 1 tablet (1 tablet Oral Given 08/04/23 0839)  irbesartan (AVAPRO) tablet 150 mg (150 mg Oral Given 08/04/23 0921)  potassium chloride SA (KLOR-CON M) CR tablet 40 mEq (40 mEq Oral Given 08/04/23 0921)     IMPRESSION / MDM / ASSESSMENT AND PLAN / ED COURSE  I reviewed the triage vital signs and the nursing notes.                              47 y.o. male with past medical history of hypertension, diabetes, and migraines who presents to the ED complaining of pain and swelling with central wound to his right shin for the past week.  Patient's presentation is most consistent with acute presentation with potential threat to life or bodily function.  Differential diagnosis includes, but is not limited to, wound infection, abscess, osteomyelitis, uncontrolled hypertension, hyperglycemia, electrolyte abnormality, AKI.  Patient nontoxic-appearing and in no acute distress, vital signs remarkable for mild tachycardia and hypertension.  He has infected wound to his right lower leg with central purulence but no focal fluctuance to suggest abscess.  He is neurovascular intact distally and I have low suspicion for osteomyelitis at this time.  We will screen labs and give initial dose of antibiotics.  Patient with no apparent symptoms related to hypertension, suspect this is due to being out of his medication and we will restart his valsartan.  X-ray shows soft tissue swelling but no evidence of osteomyelitis and suspicion for osteomyelitis remains low at this time.  Labs show hypokalemia and hypomagnesemia, which were repleted.  No AKI, anemia, or leukocytosis noted.  Patient appropriate for outpatient management, blood pressure gradually improving following dose of valsartan.  We will restart his valsartan and start on Keflex and Bactrim, also prescribe a few days of supplemental potassium  and magnesium.  He has an appointment to establish care with PCP in 2 days, was counseled to have blood work rechecked and to return to the ED for new or worsening symptoms.  Patient agrees with plan.      FINAL CLINICAL IMPRESSION(S) / ED DIAGNOSES   Final diagnoses:  Wound of right lower extremity, initial encounter  Cellulitis of right lower extremity  Uncontrolled hypertension     Rx / DC Orders   ED Discharge Orders          Ordered    cephALEXin (KEFLEX) 500 MG capsule  4 times daily  08/04/23 1057    sulfamethoxazole-trimethoprim (BACTRIM DS) 800-160 MG tablet  2 times daily        08/04/23 1057    valsartan (DIOVAN) 160 MG tablet  Daily        08/04/23 1057    potassium chloride SA (KLOR-CON M) 20 MEQ tablet  Daily        08/04/23 1057    magnesium oxide (MAG-OX) 400 MG tablet  Daily        08/04/23 1057             Note:  This document was prepared using Dragon voice recognition software and may include unintentional dictation errors.   Chesley Noon, MD 08/04/23 1058

## 2023-08-04 NOTE — ED Triage Notes (Signed)
Right lower leg wound x 1 week.  Had similar wound to right leg in the past and was treated through wound care.   Patient called wound care this morning and unable to get an appointment until 10/17.  2 cm round wound to right lower leg.  C/O pain and swelling to ankle.

## 2023-08-06 ENCOUNTER — Ambulatory Visit (INDEPENDENT_AMBULATORY_CARE_PROVIDER_SITE_OTHER): Payer: BC Managed Care – PPO | Admitting: Family

## 2023-08-06 ENCOUNTER — Encounter: Payer: Self-pay | Admitting: Family

## 2023-08-06 VITALS — BP 160/110 | HR 103 | Temp 97.9°F | Resp 18 | Ht 69.0 in | Wt 338.0 lb

## 2023-08-06 DIAGNOSIS — F339 Major depressive disorder, recurrent, unspecified: Secondary | ICD-10-CM

## 2023-08-06 DIAGNOSIS — E669 Obesity, unspecified: Secondary | ICD-10-CM

## 2023-08-06 DIAGNOSIS — G43009 Migraine without aura, not intractable, without status migrainosus: Secondary | ICD-10-CM

## 2023-08-06 DIAGNOSIS — E785 Hyperlipidemia, unspecified: Secondary | ICD-10-CM | POA: Diagnosis not present

## 2023-08-06 DIAGNOSIS — E1169 Type 2 diabetes mellitus with other specified complication: Secondary | ICD-10-CM

## 2023-08-06 DIAGNOSIS — G4733 Obstructive sleep apnea (adult) (pediatric): Secondary | ICD-10-CM | POA: Diagnosis not present

## 2023-08-06 DIAGNOSIS — I10 Essential (primary) hypertension: Secondary | ICD-10-CM

## 2023-08-06 DIAGNOSIS — F419 Anxiety disorder, unspecified: Secondary | ICD-10-CM

## 2023-08-06 MED ORDER — CLONIDINE HCL 0.1 MG PO TABS
0.1000 mg | ORAL_TABLET | Freq: Every day | ORAL | Status: DC
Start: 2023-08-06 — End: 2024-09-14
  Administered 2023-08-06: 0.1 mg via ORAL

## 2023-08-06 MED ORDER — METOPROLOL TARTRATE 25 MG PO TABS: 25.0000 mg | ORAL_TABLET | Freq: Two times a day (BID) | ORAL | 3 refills | Status: DC

## 2023-08-06 NOTE — Progress Notes (Signed)
Provider: Richarda Blade FNP-C   Catlin Doria, [name], NP  Patient Care Team: Guiselle Mian, [name], NP as PCP - General (Family Medicine) [name], [name], PA-C (Family Medicine)  Extended Emergency Contact Information Primary Emergency Contact: [name] of [place] Home Phone: [phone] Relation: Spouse  Code Status:  Full Code  Goals of care: Advanced Directive information    08/06/2023    1:04 PM  Advanced Directives  Does Patient Have a Medical Advance Directive? No     Chief Complaint  Patient presents with   Establish Care    [facility] reviewed  -Discuss BP and blood sugar    HPI:  Pt is a 47 y.o. male seen today establish care here at [facility] for medical management of chronic diseases.Has a chronic medical history of Hypertension,Type 2 Diabetes mellitus,Morbidly obesity,CKD stage,ADHD,Generalized anxiety and Depression, fatty liver disease, erectile dysfunction, obstructive sleep apnea among others.  Has not been seen by PCP for over a year.  Has been out of his blood pressure medication.  He was started on valsartan on recent ED visit.  He was seen recently on 08/04/2023 at [facility] ED for right lower leg wound which had worsened over the past week.  He noticed increased redness, swelling with draining of pus.  Has had similar wound in the past on the left leg that was managed by wound care center.  X-ray done in the ED showed right tibia/fibula x-ray soft tissue swelling without any evidence of osteomyelitis.  Lab work showed hypomagnesemia and hypokalemia which were repleted.  He was started on Keflex Bactrim and few days of supplemental potassium and magnesium.  He was advised to follow-up to establish with PCP today.  He denies any worsening drainage and states wound has improved.  Also denies any fever or chills.  Diabetes Mellitus - not checking blood sugars regularly readings in the 130's -180's.  Follows  up with Ophthalmologist Dr.McCfallen last seen may.29/2024.states no diabetic retinopathy.  Has numbness on both hands but from carpal tunnel and bone spur on the right elbow. Follows with Orthopedic  Hypertension - Home B/p readings in the 150's -160's/90's. Does not exercise.He denies any headache,dizziness,vision changes,fatigue,chest tightness,palpitation,chest pain or shortness of breath.      CKD - seen by Neprhlogist 2023.   ADHD - on Vyvanse follows up with psychiatry.    Anxiety and depression - Follows up with a therapist   Erectile dysfunction - on Cialis not using regular.   States no history of tobacco use or other illicit drugs. He does not drink alcohol 5/week  Past Medical History:  Diagnosis Date   ADHD (attention deficit hyperactivity disorder)    Anxiety    Depression    Hypertension    Migraine    Nasal congestion 10/03/2014   Non-insulin dependent type 2 diabetes mellitus (HCC)    Obesity    Sleep apnea    Wears contact lenses    Past Surgical History:  Procedure Laterality Date   CHOLECYSTECTOMY N/A 10/09/2014   Procedure: LAPAROSCOPIC CHOLECYSTECTOMY;  Surgeon: [name], MD;  Location: [facility];  Service: General;  Laterality: N/A;    No Known Allergies  Allergies as of 08/06/2023   No Known Allergies      Medication List        Accurate as of August 06, 2023 11:59 PM. If you have any questions, ask your nurse or doctor.  STOP taking these medications    ALPRAZolam 0.25 MG tablet Commonly known as: XANAX Stopped by: [name] Kiptyn Rafuse   cephALEXin 500 MG capsule Commonly known as: KEFLEX Stopped by: [name] Shakeria Robinette   dapagliflozin propanediol 5 MG Tabs tablet Commonly known as: Farxiga Stopped by: [name] Nichol Ator   rosuvastatin 20 MG tablet Commonly known as: CRESTOR Stopped by: [name] Niccole Witthuhn   sulfamethoxazole-trimethoprim 800-160 MG tablet Commonly known as: BACTRIM DS Stopped by: [name]  Shaylan Tutton   verapamil 80 MG tablet Commonly known as: CALAN Stopped by: [name] Kyle Stansell       TAKE these medications    Blood Pressure Kit 1 each by Does not apply route daily.   magnesium oxide 400 MG tablet Commonly known as: MAG-OX Take 1 tablet (400 mg total) by mouth daily for 5 days.   metoprolol tartrate 25 MG tablet Commonly known as: LOPRESSOR Take 1 tablet (25 mg total) by mouth 2 (two) times daily. Started by: [name] Markeese Boyajian   potassium chloride SA 20 MEQ tablet Commonly known as: KLOR-CON M Take 1 tablet (20 mEq total) by mouth daily for 5 days.   spironolactone 50 MG tablet Commonly known as: ALDACTONE Take 50 mg by mouth daily.   tadalafil 5 MG tablet Commonly known as: CIALIS TAKE 1 TABLET BY MOUTH DAILY AS NEEDED FOR ERECTILE DYSFUNCTION.   valsartan 160 MG tablet Commonly known as: DIOVAN Take 1 tablet (160 mg total) by mouth daily.   Vyvanse 20 MG capsule Generic drug: lisdexamfetamine Take 20 mg by mouth daily.   Vyvanse 50 MG capsule Generic drug: lisdexamfetamine Take 50 mg by mouth daily.        Review of Systems  Constitutional:  Negative for appetite change, chills, fatigue, fever and unexpected weight change.  HENT:  Negative for congestion, dental problem, ear discharge, ear pain, facial swelling, hearing loss, nosebleeds, postnasal drip, rhinorrhea, sinus pressure, sinus pain, sneezing, sore throat, tinnitus and trouble swallowing.   Eyes:  Negative for pain, discharge, redness, itching and visual disturbance.  Respiratory:  Negative for cough, chest tightness, shortness of breath and wheezing.   Cardiovascular:  Negative for chest pain, palpitations and leg swelling.  Gastrointestinal:  Negative for abdominal distention, abdominal pain, blood in stool, constipation, diarrhea, nausea and vomiting.  Endocrine: Negative for cold intolerance, heat intolerance, polydipsia, polyphagia and polyuria.  Genitourinary:  Negative for  difficulty urinating, dysuria, flank pain, frequency and urgency.  Musculoskeletal:  Negative for arthralgias, back pain, gait problem, joint swelling, myalgias, neck pain and neck stiffness.  Skin:  Positive for wound. Negative for color change, pallor and rash.       Right leg wound   Neurological:  Negative for dizziness, syncope, speech difficulty, weakness, light-headedness, numbness and headaches.  Hematological:  Does not bruise/bleed easily.  Psychiatric/Behavioral:  Negative for agitation, behavioral problems, confusion, hallucinations, self-injury, sleep disturbance and suicidal ideas. The patient is not nervous/anxious.     Immunization History  Administered Date(s) Administered   Influenza,inj,Quad PF,6+ Mos 07/05/2015, 08/19/2016, 07/29/2019, 10/17/2021   PFIZER(Purple Top)SARS-COV-2 Vaccination 03/29/2020, 04/19/2020, 08/19/2021   Pneumococcal Polysaccharide-23 05/25/2017   Tdap 05/25/2017   Pertinent  Health Maintenance Due  Topic Date Due   OPHTHALMOLOGY EXAM  09/18/2019   FOOT EXAM  10/17/2022   INFLUENZA VACCINE  06/04/2023   Colonoscopy  Never done   HEMOGLOBIN A1C  02/04/2024      05/25/2017    1:54 PM 09/17/2018    2:00 PM 10/07/2019  10:27 AM 10/17/2021    1:58 PM 08/06/2023    1:04 PM  Fall Risk  Falls in the past year? No 0 0 0 0  Was there an injury with Fall?    0   Fall Risk Category Calculator    0   Fall Risk Category (Retired)    Low   (RETIRED) Patient Fall Risk Level    Low fall risk   Patient at Risk for Falls Due to    No Fall Risks   Fall risk Follow up    Falls evaluation completed    Functional Status Survey:    Vitals:   08/06/23 1307 08/06/23 1312  BP: (!) 160/120 (!) 160/110  Pulse: (!) 103   Resp: 18   Temp: 97.9 F (36.6 C)   SpO2: 97%   Weight: (!) 338 lb (153.3 kg)   Height: 5\' 9"  (1.753 m)    Body mass index is 49.91 kg/m. Physical Exam Vitals reviewed.  Constitutional:      General: He is not in acute distress.     Appearance: Normal appearance. He is morbidly obese. He is not ill-appearing or diaphoretic.  HENT:     Head: Normocephalic.     Right Ear: Tympanic membrane, ear canal and external ear normal. There is no impacted cerumen.     Left Ear: Tympanic membrane, ear canal and external ear normal. There is no impacted cerumen.     Nose: Nose normal. No congestion or rhinorrhea.     Mouth/Throat:     Mouth: Mucous membranes are moist.     Pharynx: Oropharynx is clear. No oropharyngeal exudate or posterior oropharyngeal erythema.  Eyes:     General: No scleral icterus.       Right eye: No discharge.        Left eye: No discharge.     Extraocular Movements: Extraocular movements intact.     Conjunctiva/sclera: Conjunctivae normal.     Pupils: Pupils are equal, round, and reactive to light.  Neck:     Vascular: No carotid bruit.  Cardiovascular:     Rate and Rhythm: Normal rate and regular rhythm.     Pulses: Normal pulses.     Heart sounds: Normal heart sounds. No murmur heard.    No friction rub. No gallop.  Pulmonary:     Effort: Pulmonary effort is normal. No respiratory distress.     Breath sounds: Normal breath sounds. No wheezing, rhonchi or rales.  Chest:     Chest wall: No tenderness.  Abdominal:     General: Bowel sounds are normal. There is no distension.     Palpations: Abdomen is soft. There is no mass.     Tenderness: There is no abdominal tenderness. There is no right CVA tenderness, left CVA tenderness, guarding or rebound.  Musculoskeletal:        General: No swelling or tenderness. Normal range of motion.     Cervical back: Normal range of motion. No rigidity or tenderness.     Right lower leg: No edema.     Left lower leg: No edema.  Lymphadenopathy:     Cervical: No cervical adenopathy.  Skin:    General: Skin is warm and dry.     Coloration: Skin is not pale.     Findings: No bruising, erythema, lesion or rash.     Comments: Right mid shin area wound center with a  yellowish wound bed surrounding wound bed red in color without any drainage.  Scant amount of nonpurulent drainage noted on old dressing.  Surrounding skin tissue without any erythema or tenderness.  Wound cleansed with saline, pat dry, triple antibiotic applied then covered with Mepilex foam dressing for protection and absorption.  Neurological:     Mental Status: He is alert and oriented to person, place, and time.     Cranial Nerves: No cranial nerve deficit.     Sensory: No sensory deficit.     Motor: No weakness.     Coordination: Coordination normal.     Gait: Gait normal.  Psychiatric:        Mood and Affect: Mood normal.        Speech: Speech normal.        Behavior: Behavior normal.        Thought Content: Thought content normal.        Judgment: Judgment normal.     Labs reviewed: Recent Labs    08/04/23 0831 08/06/23 1426  NA 136 140  K 2.7* 3.6  CL 100 101  CO2 26 27  GLUCOSE 150* 98  BUN 25* 20  CREATININE 1.38* 1.56*  CALCIUM 8.3* 8.8  MG 1.6*  --    Recent Labs    08/06/23 1426  AST 16  ALT 19  BILITOT 1.0  PROT 6.4   Recent Labs    08/04/23 0831 08/06/23 1426  WBC 4.6 4.1  NEUTROABS 3.0 2,698  HGB 13.3 13.9  HCT 37.0* 41.7  MCV 80.6 86.0  PLT 204 121*   Lab Results  Component Value Date   TSH 1.18 08/06/2023   Lab Results  Component Value Date   HGBA1C 6.7 (H) 08/06/2023   Lab Results  Component Value Date   CHOL 165 08/06/2023   HDL 34 (L) 08/06/2023   LDLCALC 104 (H) 08/06/2023   TRIG 157 (H) 08/06/2023   CHOLHDL 4.9 08/06/2023    Significant Diagnostic Results in last 30 days:  DG Tibia/Fibula Right  Result Date: 08/04/2023 CLINICAL DATA:  Right lower extremity wound for the past week. Evaluate for osteomyelitis. EXAM: RIGHT TIBIA AND FIBULA - 2 VIEW COMPARISON:  Right ankle radiographs-05/14/2018 FINDINGS: No fracture or dislocation. Limited visualization of the knee demonstrates enthesopathic change involving the tibial  tuberosity as well as the superior and inferior poles of the patella. Limited visualization of the ankle suggests mild degenerative change of the tibiotalar joint with potential subchondral cysts, incompletely evaluated. There is diffuse soft tissue swelling about the lower leg without subcutaneous emphysema. No radiopaque foreign body. No discrete areas of osteolysis to suggest osteomyelitis. IMPRESSION: Diffuse soft tissue swelling about the lower leg without subcutaneous emphysema or discrete areas of osteolysis to suggest osteomyelitis. If there is high clinical concern for osteomyelitis, further evaluation with MRI could be performed as indicated. Electronically Signed   By: Simonne Come M.D.   On: 08/04/2023 08:59    Assessment/Plan  1. Type 2 diabetes mellitus with obesity (HCC) -No recent A1c or home CBG for review.  Will obtain hemoglobin A1c and microalbumin creatinine urine ratio - Dietary modification and exercise at least 3 times per week for 30 minutes advised. -Advised to check blood sugars at home once daily and follow-up in 2 weeks - Hemoglobin A1c - Microalbumin / creatinine urine ratio  2. Uncontrolled hypertension Has been out of his blood pressure medication for several months.  While statin started on recent ED visit.  Blood pressure and heart rate still elevated -Clonidine 0.1 mg tablet given -Will start on  metoprolol titrate -Continue on valsartan and spironolactone -Advised to check blood pressure daily and record on log and follow-up in 2 weeks for reevaluation - metoprolol tartrate (LOPRESSOR) 25 MG tablet; Take 1 tablet (25 mg total) by mouth 2 (two) times daily.  Dispense: 180 tablet; Refill: 3 - TSH - COMPLETE METABOLIC PANEL WITH GFR - CBC with Differential/Platelet - cloNIDine (CATAPRES) tablet 0.1 mg  3. Hyperlipidemia associated with type 2 diabetes mellitus (HCC) No recent LDL for review -Dietary modification and exercise advised as above -Consider starting  on a statin - Lipid panel  4. OSA on CPAP Continue on CPAP  5. Migraine without aura and without status migrainosus, not intractable No recent episode -Continue to monitor  6. Anxiety Stable -Continue to follow-up with psychiatry and therapist  7. Recurrent major depressive disorder, remission status unspecified (HCC) Mood stable -Not on any medication -Continue to follow-up with psychiatrist and therapist  8.ADHD -Continue on Vyvanse -Managed by psychiatrist  Family/ staff Communication: Reviewed plan of care with patient verbalized understanding  Labs/tests ordered:  - TSH - COMPLETE METABOLIC PANEL WITH GFR - CBC with Differential/Platelet - Lipid panel - Hemoglobin A1c - Microalbumin / creatinine urine ratio  Next Appointment : Return in about 2 weeks (around 08/20/2023) for blood pressure,blood sugar and right leg wound .   Caesar Bookman, NP

## 2023-08-07 LAB — CBC WITH DIFFERENTIAL/PLATELET
Absolute Monocytes: 308 {cells}/uL (ref 200–950)
Basophils Absolute: 49 {cells}/uL (ref 0–200)
Basophils Relative: 1.2 %
Eosinophils Absolute: 29 {cells}/uL (ref 15–500)
Eosinophils Relative: 0.7 %
HCT: 41.7 % (ref 38.5–50.0)
Hemoglobin: 13.9 g/dL (ref 13.2–17.1)
Lymphs Abs: 1017 {cells}/uL (ref 850–3900)
MCH: 28.7 pg (ref 27.0–33.0)
MCHC: 33.3 g/dL (ref 32.0–36.0)
MCV: 86 fL (ref 80.0–100.0)
MPV: 10.4 fL (ref 7.5–12.5)
Monocytes Relative: 7.5 %
Neutro Abs: 2698 {cells}/uL (ref 1500–7800)
Neutrophils Relative %: 65.8 %
Platelets: 121 10*3/uL — ABNORMAL LOW (ref 140–400)
RBC: 4.85 10*6/uL (ref 4.20–5.80)
RDW: 13.6 % (ref 11.0–15.0)
Total Lymphocyte: 24.8 %
WBC: 4.1 10*3/uL (ref 3.8–10.8)

## 2023-08-07 LAB — LIPID PANEL
Cholesterol: 165 mg/dL (ref <200)
HDL: 34 mg/dL — ABNORMAL LOW (ref >=40)
LDL Cholesterol (Calc): 104 mg/dL — ABNORMAL HIGH
Non-HDL Cholesterol (Calc): 131 mg/dL — ABNORMAL HIGH (ref <130)
Total CHOL/HDL Ratio: 4.9 (calc) (ref <5.0)
Triglycerides: 157 mg/dL — ABNORMAL HIGH (ref <150)

## 2023-08-07 LAB — COMPLETE METABOLIC PANEL WITH GFR
AG Ratio: 1.9 (calc) (ref 1.0–2.5)
ALT: 19 U/L (ref 9–46)
AST: 16 U/L (ref 10–40)
Albumin: 4.2 g/dL (ref 3.6–5.1)
Alkaline phosphatase (APISO): 62 U/L (ref 36–130)
BUN/Creatinine Ratio: 13 (calc) (ref 6–22)
BUN: 20 mg/dL (ref 7–25)
CO2: 27 mmol/L (ref 20–32)
Calcium: 8.8 mg/dL (ref 8.6–10.3)
Chloride: 101 mmol/L (ref 98–110)
Creat: 1.56 mg/dL — ABNORMAL HIGH (ref 0.60–1.29)
Globulin: 2.2 g/dL (ref 1.9–3.7)
Glucose, Bld: 98 mg/dL (ref 65–99)
Potassium: 3.6 mmol/L (ref 3.5–5.3)
Sodium: 140 mmol/L (ref 135–146)
Total Bilirubin: 1 mg/dL (ref 0.2–1.2)
Total Protein: 6.4 g/dL (ref 6.1–8.1)
eGFR: 55 mL/min/{1.73_m2} — ABNORMAL LOW (ref >=60)

## 2023-08-07 LAB — MICROALBUMIN / CREATININE URINE RATIO
Creatinine, Urine: 130 mg/dL (ref 20–320)
Microalb Creat Ratio: 314 mg/g{creat} — ABNORMAL HIGH (ref <30)
Microalb, Ur: 40.8 mg/dL

## 2023-08-07 LAB — HEMOGLOBIN A1C
Hgb A1c MFr Bld: 6.7 %{Hb} — ABNORMAL HIGH (ref <5.7)
Mean Plasma Glucose: 146 mg/dL
eAG (mmol/L): 8.1 mmol/L

## 2023-08-07 LAB — TSH: TSH: 1.18 m[IU]/L (ref 0.40–4.50)

## 2023-08-13 ENCOUNTER — Encounter: Payer: Self-pay | Admitting: Family

## 2023-08-13 ENCOUNTER — Ambulatory Visit: Payer: BC Managed Care – PPO | Admitting: Psychology

## 2023-08-13 DIAGNOSIS — F4323 Adjustment disorder with mixed anxiety and depressed mood: Secondary | ICD-10-CM

## 2023-08-13 DIAGNOSIS — F909 Attention-deficit hyperactivity disorder, unspecified type: Secondary | ICD-10-CM | POA: Insufficient documentation

## 2023-08-13 DIAGNOSIS — F419 Anxiety disorder, unspecified: Secondary | ICD-10-CM | POA: Insufficient documentation

## 2023-08-13 DIAGNOSIS — F32A Depression, unspecified: Secondary | ICD-10-CM | POA: Insufficient documentation

## 2023-08-13 MED ORDER — ATORVASTATIN CALCIUM 10 MG PO TABS
10.0000 mg | ORAL_TABLET | Freq: Every day | ORAL | 3 refills | Status: DC
Start: 2023-08-13 — End: 2024-08-01

## 2023-08-13 NOTE — Addendum Note (Signed)
Addended by: Rudi Heap on: 08/13/2023 01:14 PM   Modules accepted: Orders

## 2023-08-13 NOTE — Progress Notes (Signed)
[name] Counselor/Therapist Progress Note  Patient ID: [name], MRN: [id number],    Date: 08/13/2023  Time Spent: 45 mins; start time: 1400; end time: 1445  Treatment Type: Individual Therapy  Reported Symptoms: Pt presents for session via Caregility video.  Pt grants consent for session, stating he is in his car with no one else present; pt also shares that he understands the limitations of virtual session.  I shared with pt that I am in my office with no one else here either.  Mental Status Exam: Appearance:  Casual     Behavior: Appropriate  Motor: Normal  Speech/Language:  Clear and Coherent  Affect: Appropriate  Mood: normal  Thought process: normal  Thought content:   WNL  Sensory/Perceptual disturbances:   WNL  Orientation: oriented to person, place, and time/date  Attention: Good  Concentration: Good  Memory: WNL  Fund of knowledge:  Good  Insight:   Good  Judgment:  Good  Impulse Control: Good   Risk Assessment: Danger to Self:  No Self-injurious Behavior: No Danger to Others: No Duty to Warn:no Physical Aggression / Violence:No  Access to Firearms a concern: No  Gang Involvement:No   Subjective: Pt shares that, "I have been OK since we last talked; my grandmother is here visiting from [place]; she will be 45 yo this week and we are having a party for her this weekend."  Pt shares that his wife has gotten a good report from her surgery and will go back to work on Monday.  His oldest son has gotten all of his paperwork done and starts his new job at [employer] next week.  Pt also shares that his brother's father-in-law passed away and they had the funeral this past week.  Pt shares that work "is going; it is good enough.  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  Pt's two younger sons are both in HS at [employer]).  Pt shares he met with  his new PCP last week and he enjoyed the visit; the new doctor "was very thorough."  He has a follow up with her again next Friday.  He still has a wound on his leg and he is going to the Wound Care center on 10/17.  Pt shares that his sleep has continued to be pretty good of late.  Encouraged pt to be intentional about engaging in his self care activities and we will meet in 2 weeks for a follow up session.  Interventions: Cognitive Behavioral Therapy  Diagnosis:Adjustment disorder with mixed anxiety and depressed mood  Plan: Treatment Plan Strengths/Abilities:  Intelligent, Intuitive, Willing to participate in therapy Treatment Preferences:  Outpatient Individual Therapy Statement of Needs:  Patient is to use CBT, mindfulness and coping skills to help manage and/or decrease symptoms associated with their diagnosis. Symptoms:  Depressed/Irritable mood, worry, social withdrawal Problems Addressed:  Depressive thoughts, Sadness, Sleep issues, etc. Long Term Goals:  Pt to reduce overall level, frequency, and intensity of the feelings of depression/anxiety as evidenced by decreased irritability, negative self talk, and helpless feelings from 6 to 7 days/week to 0 to 1 days/week, per client report, for at least 3 consecutive months.  Progress: 30% Short Term Goals:  Pt to verbally express understanding of the relationship between feelings of depression/anxiety and their impact on thinking patterns and behaviors.  Pt to verbalize an understanding  of the role that distorted thinking plays in creating fears, excessive worry, and ruminations.  Progress: 30% Target Date:  04/24/2024 Frequency:  Bi-weekly Modality:  Cognitive Behavioral Therapy Interventions by Therapist:  Therapist will use CBT, Mindfulness exercises, Coping skills and Referrals, as needed by client. Client has verbally approved this treatment plan.  Karie Kirks, [facility]

## 2023-08-20 ENCOUNTER — Encounter (HOSPITAL_BASED_OUTPATIENT_CLINIC_OR_DEPARTMENT_OTHER): Payer: BC Managed Care – PPO | Attending: General Surgery | Admitting: General Surgery

## 2023-08-20 DIAGNOSIS — I10 Essential (primary) hypertension: Secondary | ICD-10-CM | POA: Diagnosis not present

## 2023-08-20 DIAGNOSIS — L97812 Non-pressure chronic ulcer of other part of right lower leg with fat layer exposed: Secondary | ICD-10-CM | POA: Diagnosis not present

## 2023-08-20 DIAGNOSIS — K760 Fatty (change of) liver, not elsewhere classified: Secondary | ICD-10-CM | POA: Diagnosis not present

## 2023-08-20 DIAGNOSIS — Z79899 Other long term (current) drug therapy: Secondary | ICD-10-CM | POA: Insufficient documentation

## 2023-08-20 DIAGNOSIS — Z6841 Body Mass Index (BMI) 40.0 and over, adult: Secondary | ICD-10-CM | POA: Diagnosis not present

## 2023-08-20 DIAGNOSIS — E11622 Type 2 diabetes mellitus with other skin ulcer: Secondary | ICD-10-CM | POA: Diagnosis not present

## 2023-08-20 DIAGNOSIS — L97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 with unspecified severity: Secondary | ICD-10-CM | POA: Diagnosis not present

## 2023-08-20 DIAGNOSIS — E785 Hyperlipidemia, unspecified: Secondary | ICD-10-CM | POA: Diagnosis not present

## 2023-08-20 DIAGNOSIS — G4733 Obstructive sleep apnea (adult) (pediatric): Secondary | ICD-10-CM | POA: Insufficient documentation

## 2023-08-20 DIAGNOSIS — L98492 Non-pressure chronic ulcer of skin of other sites with fat layer exposed: Secondary | ICD-10-CM | POA: Diagnosis not present

## 2023-08-20 DIAGNOSIS — Z9049 Acquired absence of other specified parts of digestive tract: Secondary | ICD-10-CM | POA: Diagnosis not present

## 2023-08-21 ENCOUNTER — Ambulatory Visit (INDEPENDENT_AMBULATORY_CARE_PROVIDER_SITE_OTHER): Payer: BC Managed Care – PPO | Admitting: Family

## 2023-08-21 ENCOUNTER — Encounter: Payer: Self-pay | Admitting: Family

## 2023-08-21 VITALS — BP 136/80 | HR 92 | Temp 97.6°F | Resp 18 | Ht 69.0 in | Wt 335.2 lb

## 2023-08-21 DIAGNOSIS — E785 Hyperlipidemia, unspecified: Secondary | ICD-10-CM | POA: Diagnosis not present

## 2023-08-21 DIAGNOSIS — E669 Obesity, unspecified: Secondary | ICD-10-CM | POA: Diagnosis not present

## 2023-08-21 DIAGNOSIS — I10 Essential (primary) hypertension: Secondary | ICD-10-CM

## 2023-08-21 DIAGNOSIS — E1169 Type 2 diabetes mellitus with other specified complication: Secondary | ICD-10-CM | POA: Diagnosis not present

## 2023-08-21 DIAGNOSIS — Z79899 Other long term (current) drug therapy: Secondary | ICD-10-CM | POA: Diagnosis not present

## 2023-08-21 DIAGNOSIS — F902 Attention-deficit hyperactivity disorder, combined type: Secondary | ICD-10-CM | POA: Diagnosis not present

## 2023-08-21 MED ORDER — LANCETS MISC. MISC: 1.0000 | Freq: Three times a day (TID) | 0 refills | Status: AC

## 2023-08-21 MED ORDER — BLOOD GLUCOSE MONITORING SUPPL DEVI: 1.0000 | Freq: Three times a day (TID) | 0 refills | Status: AC

## 2023-08-21 MED ORDER — BLOOD GLUCOSE TEST VI STRP
1.0000 | ORAL_STRIP | Freq: Three times a day (TID) | 0 refills | Status: AC
Start: 2023-08-21 — End: 2023-09-20

## 2023-08-21 MED ORDER — LANCET DEVICE MISC: 1.0000 | Freq: Three times a day (TID) | 0 refills | Status: AC

## 2023-08-21 MED ORDER — SEMAGLUTIDE(0.25 OR 0.5MG/DOS) 2 MG/3ML ~~LOC~~ SOPN
0.5000 mg | PEN_INJECTOR | SUBCUTANEOUS | 1 refills | Status: DC
Start: 2023-08-21 — End: 2023-10-16

## 2023-08-21 NOTE — Progress Notes (Signed)
[name], [name] ([id number]) [phone] [id number] Page 1 of 4 Visit Report for 08/20/2023 Abuse Risk Screen Details Patient Name: Date of Service: [name], [name] 08/20/2023 8:00 A M Medical Record Number: [id number] Patient Account Number: [id number] Date of Birth/Sex: Treating RN: Jul 19, 1976 (47 y.o. Male) Brenton Grills Primary Care Mairead Schwarzkopf: Richarda Blade Other Clinician: Referring Nasrin Lanzo: Treating Odai Wimmer/Extender: Guy Franco in Treatment: 0 Abuse Risk Screen Items Answer ABUSE RISK SCREEN: Has anyone close to you tried to hurt or harm you recentlyo No Do you feel uncomfortable with anyone in your familyo No Has anyone forced you do things that you didnt want to doo No Electronic Signature(s) Signed: 08/21/2023 12:08:28 PM By: Brenton Grills Entered By: Brenton Grills on 08/20/2023 06:09:05 -------------------------------------------------------------------------------- Activities of Daily Living Details Patient Name: Date of Service: [name] 08/20/2023 8:00 A M Medical Record Number: [id number] Patient Account Number: [id number] Date of Birth/Sex: Treating RN: 1976/03/20 (47 y.o. Male) Brenton Grills Primary Care Arleatha Philipps: Richarda Blade Other Clinician: Referring Jolana Runkles: Treating Berlie Hatchel/Extender: Guy Franco in Treatment: 0 Activities of Daily Living Items Answer Activities of Daily Living (Please select one for each item) Drive Automobile Completely Able T Medications ake Completely Able Use T elephone Completely Able Care for Appearance Completely Able Use T oilet Completely Able Bath / Shower Completely Able Dress Self Completely Able Feed Self Completely Able Walk Completely Able Get In / Out Bed Completely Able Housework Completely Able Prepare Meals Completely Able Handle Money Completely Able Shop for Self Completely Able Electronic Signature(s) Signed:  08/21/2023 12:08:28 PM By: Brenton Grills Entered By: Brenton Grills on 08/20/2023 06:09:26 [name] ([id number]) [id number] Page 2 of 4 -------------------------------------------------------------------------------- Education Screening Details Patient Name: Date of Service: [name], [name] 08/20/2023 8:00 A M Medical Record Number: [id number] Patient Account Number: [id number] Date of Birth/Sex: Treating RN: [birth year] (47 y.o. Male) Brenton Grills Primary Care Rabia Argote: Richarda Blade Other Clinician: Referring Marcellius Montagna: Treating Haylen Bellotti/Extender: Guy Franco in Treatment: 0 Learning Preferences/Education Level/Primary Language Highest Education Level: College or Above Preferred Language: English Cognitive Barrier Language Barrier: No Translator Needed: No Memory Deficit: No Emotional Barrier: No Cultural/Religious Beliefs Affecting Medical Care: No Physical Barrier Impaired Vision: No Impaired Hearing: No Decreased Hand dexterity: No Knowledge/Comprehension Knowledge Level: High Comprehension Level: High Ability to understand written instructions: High Ability to understand verbal instructions: High Motivation Anxiety Level: Calm Cooperation: Cooperative Education Importance: Acknowledges Need Interest in Health Problems: Asks Questions Perception: Coherent Willingness to Engage in Self-Management High Activities: Readiness to Engage in Self-Management High Activities: Electronic Signature(s) Signed: 08/21/2023 12:08:28 PM By: Brenton Grills Entered By: Brenton Grills on 08/20/2023 06:09:51 -------------------------------------------------------------------------------- Fall Risk Assessment Details Patient Name: Date of Service: [name], [name] 08/20/2023 8:00 A M Medical Record Number: [id number] Patient Account Number: [id number] Date of Birth/Sex: Treating RN: 10-Dec-1975 (47 y.o. Male)  Brenton Grills Primary Care Leanza Shepperson: Richarda Blade Other Clinician: Referring Ryker Pherigo: Treating Rosene Pilling/Extender: Guy Franco in Treatment: 0 Fall Risk Assessment Items Have you had 2 or more falls in the last 12 monthso 0 No Have you had any fall that resulted in injury in the last 12 monthso 0 No FALLS RISK SCREEN [name], [name] ([id number]) [id number] Page 3 of 4 History of falling - immediate or within 3 months 0 No Secondary diagnosis (Do you have 2 or more medical diagnoseso) 0 No Ambulatory aid None/bed rest/wheelchair/nurse 0 No Crutches/cane/walker 0 No Furniture  0 No Intravenous therapy Access/Saline/Heparin Lock 0 No Gait/Transferring Normal/ bed rest/ wheelchair 0 No Weak (short steps with or without shuffle, stooped but able to lift head while walking, may seek 0 No support from furniture) Impaired (short steps with shuffle, may have difficulty arising from chair, head down, impaired 0 No balance) Mental Status Oriented to own ability 0 No Electronic Signature(s) Signed: 08/21/2023 12:08:28 PM By: Brenton Grills Entered By: Brenton Grills on 08/20/2023 06:09:56 -------------------------------------------------------------------------------- Foot Assessment Details Patient Name: Date of Service: [name], [name] 08/20/2023 8:00 A M Medical Record Number: [id number] Patient Account Number: [id number] Date of Birth/Sex: Treating RN: 1976-04-09 (47 y.o. Male) Brenton Grills Primary Care Shakila Mak: Richarda Blade Other Clinician: Referring Seena Face: Treating Shifa Brisbon/Extender: Guy Franco in Treatment: 0 Foot Assessment Items Site Locations + = Sensation present, - = Sensation absent, C = Callus, U = Ulcer R = Redness, W = Warmth, M = Maceration, PU = Pre-ulcerative lesion F = Fissure, S = Swelling, D = Dryness Assessment Right: Left: Other Deformity: No No Prior Foot  Ulcer: No No Prior Amputation: No No Charcot Joint: No No Ambulatory Status: Ambulatory Without Help GaitMARCO, [name] ([id number]) [id number] Page 4 of 4 Electronic Signature(s) Signed: 08/21/2023 12:08:28 PM By: Brenton Grills Entered By: Brenton Grills on 08/20/2023 06:10:16 -------------------------------------------------------------------------------- Nutrition Risk Screening Details Patient Name: Date of Service: [name], [name] 08/20/2023 8:00 A M Medical Record Number: [id number] Patient Account Number: [id number] Date of Birth/Sex: Treating RN: 1976/02/13 (47 y.o. Male) Brenton Grills Primary Care Tiani Stanbery: Richarda Blade Other Clinician: Referring Kailer Heindel: Treating Phyllis Whitefield/Extender: Guy Franco in Treatment: 0 Height (in): 69 Weight (lbs): 330 Body Mass Index (BMI): 48.7 Nutrition Risk Screening Items Score Screening NUTRITION RISK SCREEN: I have an illness or condition that made me change the kind and/or amount of food I eat 0 No I eat fewer than two meals per day 0 No I eat few fruits and vegetables, or milk products 0 No I have three or more drinks of beer, liquor or wine almost every day 0 No I have tooth or mouth problems that make it hard for me to eat 0 No I don't always have enough money to buy the food I need 0 No I eat alone most of the time 0 No I take three or more different prescribed or over-the-counter drugs a day 0 No Without wanting to, I have lost or gained 10 pounds in the last six months 0 No I am not always physically able to shop, cook and/or feed myself 0 No Nutrition Protocols Good Risk Protocol Moderate Risk Protocol High Risk Proctocol Risk Level: Good Risk Score: 0 Electronic Signature(s) Signed: 08/21/2023 12:08:28 PM By: Brenton Grills Entered By: Brenton Grills on 08/20/2023 06:10:01

## 2023-08-21 NOTE — Progress Notes (Signed)
Patient and Caregiver Education Topics Provided Wound/Skin Impairment: Methods: Explain/Verbal Responses: State content  correctly Electronic Signature(s) Signed: 08/21/2023 12:08:28 PM By: Brenton Grills Entered By: Brenton Grills on 08/20/2023 06:18:34 -------------------------------------------------------------------------------- Wound Assessment Details Patient Name: Date of Service: [name], [name] RD [name] 08/20/2023 8:00 A M Medical Record Number: [id number] Patient Account Number: [id number] Date of Birth/Sex: Treating RN: 11-May-1976 (47 y.o. Male) Brenton Grills Primary Care Myrissa Chipley: Richarda Blade Other Clinician: Referring Suda Forbess: Treating Ayren Zumbro/Extender: Guy Franco in Treatment: 0 Wound Status Wound Number: 2 Primary Etiology: Diabetic Wound/Ulcer of the Lower Extremity Wound Location: Right, Anterior Lower Leg Wound Status: Open Wounding Event: Bump Comorbid History: Sleep Apnea, Hypertension, Type II Diabetes Date Acquired: 07/18/2023 Weeks Of Treatment: 0 Clustered Wound: No Wound Measurements Length: (cm) 4.2 Width: (cm) 4.5 Depth: (cm) 0.1 Area: (cm) 14.844 Volume: (cm) 1.484 % Reduction in Area: % Reduction in Volume: Epithelialization: Medium (34-66%) Tunneling: No Undermining: No Wound Description Classification: Grade 2 Wound Margin: Distinct, outline attached Exudate Amount: Medium Exudate Type: Serosanguineous Exudate Color: red, brown Foul Odor After Cleansing: No Slough/Fibrino Yes Wound Bed Granulation Amount: Medium (34-66%) Exposed Structure Granulation Quality: Red, Pink Fat Layer (Subcutaneous Tissue) Exposed: Yes [name], [name] ([id number]) [id number] Page 8 of 8 Periwound Skin Texture Texture Color No Abnormalities Noted: No No Abnormalities Noted: No Scarring: Yes Hemosiderin Staining: Yes Moisture Temperature / Pain No Abnormalities Noted: No Temperature: No Abnormality Treatment Notes Wound #2 (Lower Leg) Wound Laterality: Right, Anterior Cleanser Soap and Water Discharge Instruction: May  shower and wash wound with dial antibacterial soap and water prior to dressing change. Vashe 5.8 (oz) Discharge Instruction: Cleanse the wound with Vashe prior to applying a clean dressing using gauze sponges, not tissue or cotton balls. Peri-Wound Care Topical Primary Dressing IODOFLEX 0.9% Cadexomer Iodine Pad 4x6 cm Discharge Instruction: Apply to wound bed as instructed Secondary Dressing ABD Pad, 8x10 Discharge Instruction: Apply over primary dressing as directed. Secured With Compression Wrap Urgo K2 Lite, (equivalent to a 3 layer) two layer compression system, regular Discharge Instruction: Apply Urgo K2 Lite as directed (alternative to 3 layer compression). Compression Stockings Add-Ons Electronic Signature(s) Signed: 08/21/2023 12:08:28 PM By: Brenton Grills Entered By: Brenton Grills on 08/20/2023 06:13:27 -------------------------------------------------------------------------------- Vitals Details Patient Name: Date of Service: [name], [name] RD [name] 08/20/2023 8:00 A M Medical Record Number: [id number] Patient Account Number: [id number] Date of Birth/Sex: Treating RN: 1975/12/02 (47 y.o. Male) Brenton Grills Primary Care Kwaku Mostafa: Richarda Blade Other Clinician: Referring Larya Charpentier: Treating Caley Ciaramitaro/Extender: Guy Franco in Treatment: 0 Vital Signs Time Taken: 08:00 Temperature (F): 97.7 Height (in): 69 Pulse (bpm): 102 Weight (lbs): 330 Respiratory Rate (breaths/min): 18 Body Mass Index (BMI): 48.7 Blood Pressure (mmHg): 143/91 Reference Range: 80 - 120 mg / dl Electronic Signature(s) Signed: 08/21/2023 12:08:28 PM By: Brenton Grills Entered By: Brenton Grills on 08/20/2023 06:08:53  Patient and Caregiver Education Topics Provided Wound/Skin Impairment: Methods: Explain/Verbal Responses: State content  correctly Electronic Signature(s) Signed: 08/21/2023 12:08:28 PM By: Brenton Grills Entered By: Brenton Grills on 08/20/2023 06:18:34 -------------------------------------------------------------------------------- Wound Assessment Details Patient Name: Date of Service: [name], [name] RD [name] 08/20/2023 8:00 A M Medical Record Number: [id number] Patient Account Number: [id number] Date of Birth/Sex: Treating RN: 11-May-1976 (47 y.o. Male) Brenton Grills Primary Care Myrissa Chipley: Richarda Blade Other Clinician: Referring Suda Forbess: Treating Ayren Zumbro/Extender: Guy Franco in Treatment: 0 Wound Status Wound Number: 2 Primary Etiology: Diabetic Wound/Ulcer of the Lower Extremity Wound Location: Right, Anterior Lower Leg Wound Status: Open Wounding Event: Bump Comorbid History: Sleep Apnea, Hypertension, Type II Diabetes Date Acquired: 07/18/2023 Weeks Of Treatment: 0 Clustered Wound: No Wound Measurements Length: (cm) 4.2 Width: (cm) 4.5 Depth: (cm) 0.1 Area: (cm) 14.844 Volume: (cm) 1.484 % Reduction in Area: % Reduction in Volume: Epithelialization: Medium (34-66%) Tunneling: No Undermining: No Wound Description Classification: Grade 2 Wound Margin: Distinct, outline attached Exudate Amount: Medium Exudate Type: Serosanguineous Exudate Color: red, brown Foul Odor After Cleansing: No Slough/Fibrino Yes Wound Bed Granulation Amount: Medium (34-66%) Exposed Structure Granulation Quality: Red, Pink Fat Layer (Subcutaneous Tissue) Exposed: Yes [name], [name] ([id number]) [id number] Page 8 of 8 Periwound Skin Texture Texture Color No Abnormalities Noted: No No Abnormalities Noted: No Scarring: Yes Hemosiderin Staining: Yes Moisture Temperature / Pain No Abnormalities Noted: No Temperature: No Abnormality Treatment Notes Wound #2 (Lower Leg) Wound Laterality: Right, Anterior Cleanser Soap and Water Discharge Instruction: May  shower and wash wound with dial antibacterial soap and water prior to dressing change. Vashe 5.8 (oz) Discharge Instruction: Cleanse the wound with Vashe prior to applying a clean dressing using gauze sponges, not tissue or cotton balls. Peri-Wound Care Topical Primary Dressing IODOFLEX 0.9% Cadexomer Iodine Pad 4x6 cm Discharge Instruction: Apply to wound bed as instructed Secondary Dressing ABD Pad, 8x10 Discharge Instruction: Apply over primary dressing as directed. Secured With Compression Wrap Urgo K2 Lite, (equivalent to a 3 layer) two layer compression system, regular Discharge Instruction: Apply Urgo K2 Lite as directed (alternative to 3 layer compression). Compression Stockings Add-Ons Electronic Signature(s) Signed: 08/21/2023 12:08:28 PM By: Brenton Grills Entered By: Brenton Grills on 08/20/2023 06:13:27 -------------------------------------------------------------------------------- Vitals Details Patient Name: Date of Service: [name], [name] RD [name] 08/20/2023 8:00 A M Medical Record Number: [id number] Patient Account Number: [id number] Date of Birth/Sex: Treating RN: 1975/12/02 (47 y.o. Male) Brenton Grills Primary Care Kwaku Mostafa: Richarda Blade Other Clinician: Referring Larya Charpentier: Treating Caley Ciaramitaro/Extender: Guy Franco in Treatment: 0 Vital Signs Time Taken: 08:00 Temperature (F): 97.7 Height (in): 69 Pulse (bpm): 102 Weight (lbs): 330 Respiratory Rate (breaths/min): 18 Body Mass Index (BMI): 48.7 Blood Pressure (mmHg): 143/91 Reference Range: 80 - 120 mg / dl Electronic Signature(s) Signed: 08/21/2023 12:08:28 PM By: Brenton Grills Entered By: Brenton Grills on 08/20/2023 06:08:53  [name], [name] ([id number]) [id number] Page 1 of 8 Visit Report for 08/20/2023 Allergy List Details Patient Name: Date of Service: [name], [place] RD [name] 08/20/2023 8:00 A M Medical Record Number: [id number] Patient Account Number: [id number] Date of Birth/Sex: Treating RN: 16-Sep-1976 (47 y.o. Male) Brenton Grills Primary Care Lashea Goda: Richarda Blade Other Clinician: Referring Jamea Robicheaux: Treating Shlonda Dolloff/Extender: Guy Franco in Treatment: 0 Allergies Active Allergies No Known Allergies Allergy Notes Electronic Signature(s) Signed: 08/21/2023 12:08:28 PM By: Brenton Grills Entered By: Brenton Grills on 08/19/2023 08:36:36 -------------------------------------------------------------------------------- Arrival Information Details Patient Name: Date of Service: [name], [name] RD [name] 08/20/2023 8:00 A M Medical Record Number: [id number] Patient Account Number: [id number] Date of Birth/Sex: Treating RN: May 27, 1976 (47 y.o. Male) Brenton Grills Primary Care Jamarco Zaldivar: Richarda Blade Other Clinician: Referring Shameeka Silliman: Treating Bijal Siglin/Extender: Guy Franco in Treatment: 0 Visit Information Patient Arrived: Ambulatory Arrival Time: 09:07 Accompanied By: wife Transfer Assistance: None Patient Identification Verified: Yes Secondary Verification Process Completed: Yes Patient Requires Transmission-Based Precautions: No Patient Has Alerts: No History Since Last Visit All ordered tests and consults were completed: Yes Added or deleted any medications: No Any new allergies or adverse reactions: No Had a fall or experienced change in activities of daily living that may affect risk of falls: No Signs or symptoms of abuse/neglect since last visito No Hospitalized since last visit: No Implantable device outside of the clinic excluding cellular tissue based products placed in the center since last visit: No Has  Dressing in Place as Prescribed: Yes Pain Present Now: Yes Electronic Signature(s) Signed: 08/21/2023 12:08:28 PM By: Brenton Grills Entered By: Brenton Grills on 08/20/2023 06:08:05 [name] ([id number]) [id number] Page 2 of 8 -------------------------------------------------------------------------------- Clinic Level of Care Assessment Details Patient Name: Date of Service: [name], [place] RD [name] 08/20/2023 8:00 A M Medical Record Number: [id number] Patient Account Number: [id number] Date of Birth/Sex: Treating RN: [birth year] (47 y.o. Male) Brenton Grills Primary Care Georgetta Crafton: Richarda Blade Other Clinician: Referring Tyree Fluharty: Treating Javid Kemler/Extender: Guy Franco in Treatment: 0 Clinic Level of Care Assessment Items TOOL 1 Quantity Score X- 1 0 Use when EandM and Procedure is performed on INITIAL visit ASSESSMENTS - Nursing Assessment / Reassessment X- 1 20 General Physical Exam (combine w/ comprehensive assessment (listed just below) when performed on new pt. evals) X- 1 25 Comprehensive Assessment (HX, ROS, Risk Assessments, Wounds Hx, etc.) ASSESSMENTS - Wound and Skin Assessment / Reassessment X- 1 10 Dermatologic / Skin Assessment (not related to wound area) ASSESSMENTS - Ostomy and/or Continence Assessment and Care []  - 0 Incontinence Assessment and Management []  - 0 Ostomy Care Assessment and Management (repouching, etc.) PROCESS - Coordination of Care X - Simple Patient / Family Education for ongoing care 1 15 []  - 0 Complex (extensive) Patient / Family Education for ongoing care X- 1 10 Staff obtains Chiropractor, Records, T Results / Process Orders est []  - 0 Staff telephones HHA, Nursing Homes / Clarify orders / etc []  - 0 Routine Transfer to another Facility (non-emergent condition) []  - 0 Routine Hospital Admission (non-emergent condition) X- 1 15 New Admissions / Manufacturing engineer / Ordering  NPWT Apligraf, etc. , []  - 0 Emergency Hospital Admission (emergent condition) PROCESS - Special Needs []  - 0 Pediatric / Minor Patient Management []  - 0 Isolation Patient Management []  - 0 Hearing / Language / Visual special needs []  - 0 Assessment of Community assistance (transportation, D/C planning, etc.) []  - 0 Additional assistance / Altered mentation []  -  Patient and Caregiver Education Topics Provided Wound/Skin Impairment: Methods: Explain/Verbal Responses: State content  correctly Electronic Signature(s) Signed: 08/21/2023 12:08:28 PM By: Brenton Grills Entered By: Brenton Grills on 08/20/2023 06:18:34 -------------------------------------------------------------------------------- Wound Assessment Details Patient Name: Date of Service: [name], [name] RD [name] 08/20/2023 8:00 A M Medical Record Number: [id number] Patient Account Number: [id number] Date of Birth/Sex: Treating RN: 11-May-1976 (47 y.o. Male) Brenton Grills Primary Care Myrissa Chipley: Richarda Blade Other Clinician: Referring Suda Forbess: Treating Ayren Zumbro/Extender: Guy Franco in Treatment: 0 Wound Status Wound Number: 2 Primary Etiology: Diabetic Wound/Ulcer of the Lower Extremity Wound Location: Right, Anterior Lower Leg Wound Status: Open Wounding Event: Bump Comorbid History: Sleep Apnea, Hypertension, Type II Diabetes Date Acquired: 07/18/2023 Weeks Of Treatment: 0 Clustered Wound: No Wound Measurements Length: (cm) 4.2 Width: (cm) 4.5 Depth: (cm) 0.1 Area: (cm) 14.844 Volume: (cm) 1.484 % Reduction in Area: % Reduction in Volume: Epithelialization: Medium (34-66%) Tunneling: No Undermining: No Wound Description Classification: Grade 2 Wound Margin: Distinct, outline attached Exudate Amount: Medium Exudate Type: Serosanguineous Exudate Color: red, brown Foul Odor After Cleansing: No Slough/Fibrino Yes Wound Bed Granulation Amount: Medium (34-66%) Exposed Structure Granulation Quality: Red, Pink Fat Layer (Subcutaneous Tissue) Exposed: Yes [name], [name] ([id number]) [id number] Page 8 of 8 Periwound Skin Texture Texture Color No Abnormalities Noted: No No Abnormalities Noted: No Scarring: Yes Hemosiderin Staining: Yes Moisture Temperature / Pain No Abnormalities Noted: No Temperature: No Abnormality Treatment Notes Wound #2 (Lower Leg) Wound Laterality: Right, Anterior Cleanser Soap and Water Discharge Instruction: May  shower and wash wound with dial antibacterial soap and water prior to dressing change. Vashe 5.8 (oz) Discharge Instruction: Cleanse the wound with Vashe prior to applying a clean dressing using gauze sponges, not tissue or cotton balls. Peri-Wound Care Topical Primary Dressing IODOFLEX 0.9% Cadexomer Iodine Pad 4x6 cm Discharge Instruction: Apply to wound bed as instructed Secondary Dressing ABD Pad, 8x10 Discharge Instruction: Apply over primary dressing as directed. Secured With Compression Wrap Urgo K2 Lite, (equivalent to a 3 layer) two layer compression system, regular Discharge Instruction: Apply Urgo K2 Lite as directed (alternative to 3 layer compression). Compression Stockings Add-Ons Electronic Signature(s) Signed: 08/21/2023 12:08:28 PM By: Brenton Grills Entered By: Brenton Grills on 08/20/2023 06:13:27 -------------------------------------------------------------------------------- Vitals Details Patient Name: Date of Service: [name], [name] RD [name] 08/20/2023 8:00 A M Medical Record Number: [id number] Patient Account Number: [id number] Date of Birth/Sex: Treating RN: 1975/12/02 (47 y.o. Male) Brenton Grills Primary Care Kwaku Mostafa: Richarda Blade Other Clinician: Referring Larya Charpentier: Treating Caley Ciaramitaro/Extender: Guy Franco in Treatment: 0 Vital Signs Time Taken: 08:00 Temperature (F): 97.7 Height (in): 69 Pulse (bpm): 102 Weight (lbs): 330 Respiratory Rate (breaths/min): 18 Body Mass Index (BMI): 48.7 Blood Pressure (mmHg): 143/91 Reference Range: 80 - 120 mg / dl Electronic Signature(s) Signed: 08/21/2023 12:08:28 PM By: Brenton Grills Entered By: Brenton Grills on 08/20/2023 06:08:53

## 2023-08-21 NOTE — Progress Notes (Signed)
YOUNUS, GUARNIERI B ([id number]) [id number] Page 1 of 11 Visit Report for 08/20/2023 Chief Complaint Document Details Patient Name: Date of Service: [name], [place] RD B. 08/20/2023 8:00 A M Medical Record Number: [id number] Patient Account Number: [id number] Date of Birth/Sex: Treating RN: 08/28/1976 (47 y.o. Male) Primary Care Provider: Richarda Blade Other Clinician: Referring Provider: Treating Provider/Extender: Guy Franco in Treatment: 0 Information Obtained from: Patient Chief Complaint 11/23/2018; patient is here for review of an ulcer on the left posterior calf 10/06/2019; patient is here for review of the wound area from the previous visit which is on the left posterior lateral calf 08/20/2023: here for new wound on RLE anterior Electronic Signature(s) Signed: 08/20/2023 10:34:13 AM By: Duanne Guess MD FACS Entered By: Duanne Guess on 08/20/2023 10:34:13 -------------------------------------------------------------------------------- Debridement Details Patient Name: Date of Service: [name], [name] 08/20/2023 8:00 A M Medical Record Number: [id number] Patient Account Number: [id number] Date of Birth/Sex: Treating RN: 1976-10-05 (47 y.o. Male) Brenton Grills Primary Care Provider: Richarda Blade Other Clinician: Referring Provider: Treating Provider/Extender: Guy Franco in Treatment: 0 Debridement Performed for Assessment: Wound #2 Right,Anterior Lower Leg Performed By: Physician Duanne Guess, MD The following information was scribed by: Brenton Grills The information was scribed for: Duanne Guess Debridement Type: Debridement Severity of Tissue Pre Debridement: Fat layer exposed Level of Consciousness (Pre-procedure): Awake and Alert Pre-procedure Verification/Time Out Yes - 08:15 Taken: Start Time: 08:16 Pain Control: Lidocaine 4% T opical Solution Percent of Wound Bed  Debrided: 100% T Area Debrided (cm): otal 14.84 Tissue and other material debrided: Non-Viable, Slough, Subcutaneous, Slough Level: Skin/Subcutaneous Tissue Debridement Description: Excisional Instrument: Curette Specimen: Tissue Culture Number of Specimens T aken: 1 Bleeding: Minimum Hemostasis Achieved: Pressure Response to Treatment: Procedure was tolerated well Level of Consciousness (Post- Awake and Alert procedure): Post Debridement Measurements of Total Wound Length: (cm) 4.2 Width: (cm) 4.5 Fok, Astin B ([id number]) [id number] Page 2 of 11 Depth: (cm) 0.1 Volume: (cm) 1.484 Character of Wound/Ulcer Post Debridement: Stable Severity of Tissue Post Debridement: Fat layer exposed Post Procedure Diagnosis Same as Pre-procedure Electronic Signature(s) Signed: 08/20/2023 9:16:50 AM By: Duanne Guess MD FACS Signed: 08/21/2023 12:08:28 PM By: Brenton Grills Entered By: Brenton Grills on 08/20/2023 09:14:57 -------------------------------------------------------------------------------- HPI Details Patient Name: Date of Service: [name], [name] 08/20/2023 8:00 A M Medical Record Number: [id number] Patient Account Number: [id number] Date of Birth/Sex: Treating RN: [birth year] (47 y.o. Male) Primary Care Provider: Richarda Blade Other Clinician: Referring Provider: Treating Provider/Extender: Guy Franco in Treatment: 0 History of Present Illness HPI Description: Admission 11/23/2018 This is a 47 year old man with type 2 diabetes. He tells [place] that about a week before Thanksgiving he noticed a small painful area on the back of his left mid calf. He saw a physician at urgent care on 10/10/2018 noted to have an open wound on the left calf. He was given Bactroban and Keflex as CandS was apparently negative. He saw his own primary doctor on 612/31/19 and was given Silvadene and doxycycline. He has been using Silvadene since  then. He is saw his primary physician again on 1/10 was offered an Radio broadcast assistant but I do not think that he actually use this. The area in question is on the left posterior calf. There is some question that this was a insect bite and when he first presented but he certainly did not notice his any particular bite injury at that time. This did actually apparently at one  Paternal  Grandparents; Heart Disease: No; Hereditary Spherocytosis: No; Hypertension: Yes - Mother; Kidney Disease: Yes - Father; Lung Disease: No; Seizures: No; Stroke: Yes - Paternal Grandparents; Thyroid Problems: No; Tuberculosis: No; Never smoker; Marital Status - Married; Alcohol Use: Moderate; Drug Use: Prior History - TCH in college; Caffeine Use: Daily - coffee; Financial Concerns: No; Food, Clothing or Shelter Needs: No; Support System Lacking: No; Transportation Concerns: No Electronic Signature(s) Signed: 08/20/2023 9:16:50 AM By: Duanne Guess MD FACS Signed: 08/21/2023 12:08:28 PM By: Brenton Grills Entered By: Brenton Grills on 08/19/2023 11:47:42 -------------------------------------------------------------------------------- SuperBill Details Patient Name: Date of Service: [name] RD B. 08/20/2023 Medical Record Number: [id number] Patient Account Number: [id number] Date of Birth/Sex: Treating RN: 1976/01/14 (47 y.o. Male) Brenton Grills Primary Care Provider: Richarda Blade Other Clinician: Referring Provider: Treating Provider/Extender: Guy Franco in Treatment: 0 Diagnosis Coding ICD-10 Codes Code Description [phone] Non-pressure chronic ulcer of other part of right lower leg with unspecified severity E11.622 Type 2 diabetes mellitus with other skin ulcer E66.01 Morbid (severe) obesity due to excess calories Facility Procedures : CPT4 Code: [id number] Description: WOUND CARE VISIT-LEV 3 NEW PT Modifier: 25 Quantity: 1 : CPT4 Code: 40102725 Description: 11042 - DEB SUBQ TISSUE 20 SQ CM/< ICD-10 Diagnosis Description L97.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 with unspecified se Modifier: verity Quantity: 1 Physician Procedures : CPT4 Code Description Modifier [id number] - WC PHYS LEVEL 4 - NEW PT 25 ICD-10 Diagnosis Description L97.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 with unspecified  severity E11.622 Type 2 diabetes mellitus with other skin ulcer  E66.01 Morbid (severe) obesity due to excess calories Quantity: 1 : [id number] 11042 - WC PHYS SUBQ TISS 20 SQ CM ICD-10 Diagnosis Description L97.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 with unspecified severity Quantity: 1 Electronic Signature(s) Signed: 08/20/2023 10:43:48 AM By: Duanne Guess MD FACS Entered By: Duanne Guess on 08/20/2023 10:43:48 [name] ([id number]) [id number] Page 11 of 11  IODOFLEX 0.9% Cadexomer Iodine Pad 4x6 cm ary Discharge Instructions: Apply to wound bed as instructed Secondary Dressing: ABD Pad, 8x10 Discharge Instructions: Apply over primary dressing as directed. Com pression Wrap: Urgo K2 Lite, (equivalent to a 3 layer) two layer compression system, regular Discharge Instructions: Apply Urgo K2 Lite as directed (alternative to 3 layer compression). 08/20/2023: This is a 47 year old type II diabetic return to clinic with an ulcer on his right lower leg. On the right anterior tibial surface, there is a circular wound with a completely necrotic surface. There is an odor coming from the wound, but no periwound erythema or induration. No purulent drainage. I used a curette to debride slough and subcutaneous tissue from the wound. I  then took a PCR culture. The wound is in need of further debridement so we will apply Iodoflex with 3 layer compression equivalent. Once culture data return, I will make appropriate therapeutic interventions as indicated. We discussed the importance of adequate protein intake and elevating his legs to help control edema. Follow-up in about 1 week. Electronic Signature(s) Signed: 08/20/2023 10:43:30 AM By: Duanne Guess MD FACS Entered By: Duanne Guess on 08/20/2023 10:43:30 -------------------------------------------------------------------------------- HxROS Details Patient Name: Date of Service: [name], [name] 08/20/2023 8:00 A M Medical Record Number: [id number] Patient Account Number: [id number] Date of Birth/Sex: Treating RN: Mar 17, 1976 (47 y.o. Male) Brenton Grills Primary Care Provider: Richarda Blade Other Clinician: Referring Provider: Treating Provider/Extender: Guy Franco in Treatment: 0 Information Obtained From Patient Chart Eyes Complaints and Symptoms: Positive for: Glasses / Contacts Medical History: Negative for: Cataracts; Glaucoma; Optic Neuritis Integumentary (Skin) Complaints and Symptoms: Positive for: Wounds - right lower leg Medical History: Negative for: History of Burn Past Medical History Notes: hand dermatitis Constitutional Symptoms (General Health) Medical History: Past Medical History Notes: morbid obesity, migraines Ear/Nose/Mouth/Throat Medical History: Negative for: Chronic sinus problems/congestion; Middle ear problems NAAMAN, MANLOVE B ([id number]) [id number] Page 9 of 11 Past Medical History Notes: nasal congestion Hematologic/Lymphatic Medical History: Negative for: Anemia; Hemophilia; Human Immunodeficiency Virus; Lymphedema; Sickle Cell Disease Respiratory Medical History: Positive for: Sleep Apnea - CPAP Negative for: Aspiration; Asthma; Chronic Obstructive Pulmonary  Disease (COPD); Pneumothorax; Tuberculosis Cardiovascular Medical History: Positive for: Hypertension - b/p has been running high due to running out of medication Negative for: Angina; Arrhythmia; Congestive Heart Failure; Coronary Artery Disease; Deep Vein Thrombosis; Hypotension; Myocardial Infarction; Peripheral Arterial Disease; Peripheral Venous Disease; Phlebitis; Vasculitis Past Medical History Notes: hyperlipidemia Gastrointestinal Medical History: Negative for: Cirrhosis ; Colitis; Crohns; Hepatitis A; Hepatitis B; Hepatitis C Endocrine Medical History: Positive for: Type II Diabetes Negative for: Type I Diabetes Past Medical History Notes: hypogonadism, low testosterone Time with diabetes: 1 year Treated with: Oral agents Blood sugar tested every day: No Genitourinary Medical History: Negative for: End Stage Renal Disease Past Medical History Notes: CKD Immunological Medical History: Negative for: Lupus Erythematosus; Raynauds; Scleroderma Musculoskeletal Medical History: Negative for: Gout; Rheumatoid Arthritis; Osteoarthritis; Osteomyelitis Neurologic Medical History: Negative for: Dementia; Neuropathy; Quadriplegia; Paraplegia; Seizure Disorder Oncologic Medical History: Negative for: Received Chemotherapy; Received Radiation Psychiatric Medical History: Negative for: Anorexia/bulimia; Confinement Anxiety Past Medical History Notes: anxiety, depression, ADHD Immunizations Pneumococcal Vaccine: Received Pneumococcal Vaccination: Yes Received Pneumococcal Vaccination On or After 60th Birthday: No Tetanus Vaccine: Last tetanus shot: 05/25/2017 OMARIAN, SANDLER B ([id number]) [id number] Page 10 of 11 Implantable Devices None Hospitalization / Surgery History Type of Hospitalization/Surgery 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 He has been using Silvadene for the past 3 weeks Past medical history; obstructive sleep apnea, Achilles tendinitis, hypertension, type 2 diabetes, cholecystectomy, fatty liver, ABI in this clinic was 1.38 on the left 11/30/2018; the area is essentially unchanged in size although the surface of the wound looks better. He has surrounding inflammation around the wound the exact etiology of this is unclear. 12/07/2018; the area has come down in diameter. Surface requires debridement. We have been using silver collagen. Is the wound seems to have come down in size I have elected not to biopsy this. Surrounding inflammation is also less 2/11; the area continues to come down in size and generally looks healthy. We have been using silver collagen under compression surrounding inflammation is less there is no tenderness 2/18; the area continues to contract in size and looks healthy. We have been using silver collagen under compression 2/25; no major change in dimension this week. However the wound bed continues to look healthy and there appears to be less inflammation around the wound 3/3; the wound measures slightly smaller. Healthy looking surface. Less inflammation 3/10; wound is closed. Healthy looking surface. Still some discoloration around the wound area but absolutely no tenderness. The cause of this wound was never really determined. However it progressed nicely towards healing READMISSION 10/06/2019 This is a patient I dealt with with an unusual  wound on the left posterior lateral calf in the early part of this year. I was never really completely sure I understood what this was in terms of causation. However wde use silver collagen under compression and the area responded nicely and we are able to discharge him after roughly 6 weeks in the clinic. He did not clearly have arterial or venous issues. I had some thoughts about biopsying this area however acid continued to get better I did not go through with this. He states that everything is been fine up until the last few weeks. His wife noted a scab with perhaps some purulent drainage. They called primary care and was prescribed doxy which she is finished and everything seems to have settled down there was no open area however the patient wanted [place] to look at this. READMISSION 08/20/2023 ***ABIs L: 1.04; R: 1.11*** This is a now 47 year old type II diabetic (last hemoglobin A1c 6.7%) who returns with a new wound on his right lower extremity, anterior tibial surface. He says it began exactly the way he is previous wound that led to wound care center admission started. He was mowing the lawn and developed a fall on his leg. It subsequently broke down resulting in a large ulcer. Patient was seen in the emergency department on 04 August 2023. He was given a prescription for Keflex and Bactrim. He was referred to the wound care center for further evaluation and management Electronic Signature(s) Signed: 08/20/2023 10:36:23 AM By: Duanne Guess MD FACS Entered By: Duanne Guess on 08/20/2023 10:36:22 [name] ([id number]) [id number] Page 3 of 11 -------------------------------------------------------------------------------- Physical Exam Details Patient Name: Date of Service: [name], [name] 08/20/2023 8:00 A M Medical Record Number: [id number] Patient Account Number: [id number] Date of Birth/Sex: Treating RN: 07-03-1976 (47 y.o. Male) Primary Care  Provider: Richarda Blade Other Clinician: Referring Provider: Treating Provider/Extender: Guy Franco in Treatment: 0 Constitutional Hypertensive, asymptomatic. Slightly tachycardic. . . No acute distress. Respiratory Normal work of breathing on room air. Notes 08/20/2023: On the  YOUNUS, GUARNIERI B ([id number]) [id number] Page 1 of 11 Visit Report for 08/20/2023 Chief Complaint Document Details Patient Name: Date of Service: [name], [place] RD B. 08/20/2023 8:00 A M Medical Record Number: [id number] Patient Account Number: [id number] Date of Birth/Sex: Treating RN: 08/28/1976 (47 y.o. Male) Primary Care Provider: Richarda Blade Other Clinician: Referring Provider: Treating Provider/Extender: Guy Franco in Treatment: 0 Information Obtained from: Patient Chief Complaint 11/23/2018; patient is here for review of an ulcer on the left posterior calf 10/06/2019; patient is here for review of the wound area from the previous visit which is on the left posterior lateral calf 08/20/2023: here for new wound on RLE anterior Electronic Signature(s) Signed: 08/20/2023 10:34:13 AM By: Duanne Guess MD FACS Entered By: Duanne Guess on 08/20/2023 10:34:13 -------------------------------------------------------------------------------- Debridement Details Patient Name: Date of Service: [name], [name] 08/20/2023 8:00 A M Medical Record Number: [id number] Patient Account Number: [id number] Date of Birth/Sex: Treating RN: 1976-10-05 (47 y.o. Male) Brenton Grills Primary Care Provider: Richarda Blade Other Clinician: Referring Provider: Treating Provider/Extender: Guy Franco in Treatment: 0 Debridement Performed for Assessment: Wound #2 Right,Anterior Lower Leg Performed By: Physician Duanne Guess, MD The following information was scribed by: Brenton Grills The information was scribed for: Duanne Guess Debridement Type: Debridement Severity of Tissue Pre Debridement: Fat layer exposed Level of Consciousness (Pre-procedure): Awake and Alert Pre-procedure Verification/Time Out Yes - 08:15 Taken: Start Time: 08:16 Pain Control: Lidocaine 4% T opical Solution Percent of Wound Bed  Debrided: 100% T Area Debrided (cm): otal 14.84 Tissue and other material debrided: Non-Viable, Slough, Subcutaneous, Slough Level: Skin/Subcutaneous Tissue Debridement Description: Excisional Instrument: Curette Specimen: Tissue Culture Number of Specimens T aken: 1 Bleeding: Minimum Hemostasis Achieved: Pressure Response to Treatment: Procedure was tolerated well Level of Consciousness (Post- Awake and Alert procedure): Post Debridement Measurements of Total Wound Length: (cm) 4.2 Width: (cm) 4.5 Fok, Astin B ([id number]) [id number] Page 2 of 11 Depth: (cm) 0.1 Volume: (cm) 1.484 Character of Wound/Ulcer Post Debridement: Stable Severity of Tissue Post Debridement: Fat layer exposed Post Procedure Diagnosis Same as Pre-procedure Electronic Signature(s) Signed: 08/20/2023 9:16:50 AM By: Duanne Guess MD FACS Signed: 08/21/2023 12:08:28 PM By: Brenton Grills Entered By: Brenton Grills on 08/20/2023 09:14:57 -------------------------------------------------------------------------------- HPI Details Patient Name: Date of Service: [name], [name] 08/20/2023 8:00 A M Medical Record Number: [id number] Patient Account Number: [id number] Date of Birth/Sex: Treating RN: [birth year] (47 y.o. Male) Primary Care Provider: Richarda Blade Other Clinician: Referring Provider: Treating Provider/Extender: Guy Franco in Treatment: 0 History of Present Illness HPI Description: Admission 11/23/2018 This is a 47 year old man with type 2 diabetes. He tells [place] that about a week before Thanksgiving he noticed a small painful area on the back of his left mid calf. He saw a physician at urgent care on 10/10/2018 noted to have an open wound on the left calf. He was given Bactroban and Keflex as CandS was apparently negative. He saw his own primary doctor on 612/31/19 and was given Silvadene and doxycycline. He has been using Silvadene since  then. He is saw his primary physician again on 1/10 was offered an Radio broadcast assistant but I do not think that he actually use this. The area in question is on the left posterior calf. There is some question that this was a insect bite and when he first presented but he certainly did not notice his any particular bite injury at that time. This did actually apparently at one  IODOFLEX 0.9% Cadexomer Iodine Pad 4x6 cm ary Discharge Instructions: Apply to wound bed as instructed Secondary Dressing: ABD Pad, 8x10 Discharge Instructions: Apply over primary dressing as directed. Com pression Wrap: Urgo K2 Lite, (equivalent to a 3 layer) two layer compression system, regular Discharge Instructions: Apply Urgo K2 Lite as directed (alternative to 3 layer compression). 08/20/2023: This is a 47 year old type II diabetic return to clinic with an ulcer on his right lower leg. On the right anterior tibial surface, there is a circular wound with a completely necrotic surface. There is an odor coming from the wound, but no periwound erythema or induration. No purulent drainage. I used a curette to debride slough and subcutaneous tissue from the wound. I  then took a PCR culture. The wound is in need of further debridement so we will apply Iodoflex with 3 layer compression equivalent. Once culture data return, I will make appropriate therapeutic interventions as indicated. We discussed the importance of adequate protein intake and elevating his legs to help control edema. Follow-up in about 1 week. Electronic Signature(s) Signed: 08/20/2023 10:43:30 AM By: Duanne Guess MD FACS Entered By: Duanne Guess on 08/20/2023 10:43:30 -------------------------------------------------------------------------------- HxROS Details Patient Name: Date of Service: [name], [name] 08/20/2023 8:00 A M Medical Record Number: [id number] Patient Account Number: [id number] Date of Birth/Sex: Treating RN: Mar 17, 1976 (47 y.o. Male) Brenton Grills Primary Care Provider: Richarda Blade Other Clinician: Referring Provider: Treating Provider/Extender: Guy Franco in Treatment: 0 Information Obtained From Patient Chart Eyes Complaints and Symptoms: Positive for: Glasses / Contacts Medical History: Negative for: Cataracts; Glaucoma; Optic Neuritis Integumentary (Skin) Complaints and Symptoms: Positive for: Wounds - right lower leg Medical History: Negative for: History of Burn Past Medical History Notes: hand dermatitis Constitutional Symptoms (General Health) Medical History: Past Medical History Notes: morbid obesity, migraines Ear/Nose/Mouth/Throat Medical History: Negative for: Chronic sinus problems/congestion; Middle ear problems NAAMAN, MANLOVE B ([id number]) [id number] Page 9 of 11 Past Medical History Notes: nasal congestion Hematologic/Lymphatic Medical History: Negative for: Anemia; Hemophilia; Human Immunodeficiency Virus; Lymphedema; Sickle Cell Disease Respiratory Medical History: Positive for: Sleep Apnea - CPAP Negative for: Aspiration; Asthma; Chronic Obstructive Pulmonary  Disease (COPD); Pneumothorax; Tuberculosis Cardiovascular Medical History: Positive for: Hypertension - b/p has been running high due to running out of medication Negative for: Angina; Arrhythmia; Congestive Heart Failure; Coronary Artery Disease; Deep Vein Thrombosis; Hypotension; Myocardial Infarction; Peripheral Arterial Disease; Peripheral Venous Disease; Phlebitis; Vasculitis Past Medical History Notes: hyperlipidemia Gastrointestinal Medical History: Negative for: Cirrhosis ; Colitis; Crohns; Hepatitis A; Hepatitis B; Hepatitis C Endocrine Medical History: Positive for: Type II Diabetes Negative for: Type I Diabetes Past Medical History Notes: hypogonadism, low testosterone Time with diabetes: 1 year Treated with: Oral agents Blood sugar tested every day: No Genitourinary Medical History: Negative for: End Stage Renal Disease Past Medical History Notes: CKD Immunological Medical History: Negative for: Lupus Erythematosus; Raynauds; Scleroderma Musculoskeletal Medical History: Negative for: Gout; Rheumatoid Arthritis; Osteoarthritis; Osteomyelitis Neurologic Medical History: Negative for: Dementia; Neuropathy; Quadriplegia; Paraplegia; Seizure Disorder Oncologic Medical History: Negative for: Received Chemotherapy; Received Radiation Psychiatric Medical History: Negative for: Anorexia/bulimia; Confinement Anxiety Past Medical History Notes: anxiety, depression, ADHD Immunizations Pneumococcal Vaccine: Received Pneumococcal Vaccination: Yes Received Pneumococcal Vaccination On or After 60th Birthday: No Tetanus Vaccine: Last tetanus shot: 05/25/2017 OMARIAN, SANDLER B ([id number]) [id number] Page 10 of 11 Implantable Devices None Hospitalization / Surgery History Type of Hospitalization/Surgery 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 Family and Social History Cancer: Yes - Maternal Grandparents; Diabetes: Yes -  0 Active Problems ICD-10 Encounter Code Description Active Date MDM Diagnosis L97.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 with unspecified 08/20/2023 No Yes severity E11.622 Type 2 diabetes mellitus with other skin ulcer 08/20/2023 No Yes E66.01 Morbid (severe) obesity due to excess calories 08/20/2023 No Yes Inactive Problems Resolved Problems Electronic Signature(s) Signed: 08/20/2023 10:33:37 AM By: Duanne Guess MD FACS Entered By: Duanne Guess on 08/20/2023 10:33:37 -------------------------------------------------------------------------------- Progress Note Details Patient Name: Date of Service: [name], [name] 08/20/2023 8:00 A M Medical Record Number: [id number] Patient Account Number: [id number] Date of Birth/Sex: Treating RN: Mar 17, 1976 (47 y.o. Male) Primary Care Provider: Richarda Blade Other Clinician: Referring Provider: Treating Provider/Extender: Guy Franco in Treatment: 0 Subjective Chief Complaint Information obtained from Patient 11/23/2018; patient is here for review of an ulcer on the left posterior calf 10/06/2019;  patient is here for review of the wound area from the previous visit which is on the left posterior lateral calf 08/20/2023: here for new wound on RLE anterior History of Present Illness (HPI) Admission 11/23/2018 This is a 47 year old man with type 2 diabetes. He tells [place] that about a week before Thanksgiving he noticed a small painful area on the back of his left mid calf. He saw a physician at urgent care on 10/10/2018 noted to have an open wound on the left calf. He was given Bactroban and Keflex as CandS was apparently negative. He saw his own primary doctor on 612/31/19 and was given Silvadene and doxycycline. He has been using Silvadene since then. He is saw his primary physician again on 1/10 was offered an Radio broadcast assistant but I do not think that he actually use this. The area in question is on the left posterior calf. There is some question that this was a insect bite and when he first presented but he certainly did not notice his any particular bite injury at that time. This did actually apparently at one point look as though it was progressing towards closure although it then it opened again. He has been using Silvadene for the past 3 weeks Past medical history; obstructive sleep apnea, Achilles tendinitis, hypertension, type 2 diabetes, cholecystectomy, fatty liver, ABI in this clinic was 1.38 on the left 11/30/2018; the area is essentially unchanged in size although the surface of the wound looks better. He has surrounding inflammation around the wound the exact etiology of this is unclear. 12/07/2018; the area has come down in diameter. Surface requires debridement. We have been using silver collagen. Is the wound seems to have come down in size I have elected not to biopsy this. Surrounding inflammation is also less KEIVON, AMENDOLA B ([id number]) [id number] Page 6 of 11 2/11; the area continues to come down in size and generally looks healthy. We have been using silver  collagen under compression surrounding inflammation is less there is no tenderness 2/18; the area continues to contract in size and looks healthy. We have been using silver collagen under compression 2/25; no major change in dimension this week. However the wound bed continues to look healthy and there appears to be less inflammation around the wound 3/3; the wound measures slightly smaller. Healthy looking surface. Less inflammation 3/10; wound is closed. Healthy looking surface. Still some discoloration around the wound area but absolutely no tenderness. The cause of this wound was never really determined. However it progressed nicely towards healing READMISSION 10/06/2019 This is a patient I dealt with with an unusual wound on the left  YOUNUS, GUARNIERI B ([id number]) [id number] Page 1 of 11 Visit Report for 08/20/2023 Chief Complaint Document Details Patient Name: Date of Service: [name], [place] RD B. 08/20/2023 8:00 A M Medical Record Number: [id number] Patient Account Number: [id number] Date of Birth/Sex: Treating RN: 08/28/1976 (47 y.o. Male) Primary Care Provider: Richarda Blade Other Clinician: Referring Provider: Treating Provider/Extender: Guy Franco in Treatment: 0 Information Obtained from: Patient Chief Complaint 11/23/2018; patient is here for review of an ulcer on the left posterior calf 10/06/2019; patient is here for review of the wound area from the previous visit which is on the left posterior lateral calf 08/20/2023: here for new wound on RLE anterior Electronic Signature(s) Signed: 08/20/2023 10:34:13 AM By: Duanne Guess MD FACS Entered By: Duanne Guess on 08/20/2023 10:34:13 -------------------------------------------------------------------------------- Debridement Details Patient Name: Date of Service: [name], [name] 08/20/2023 8:00 A M Medical Record Number: [id number] Patient Account Number: [id number] Date of Birth/Sex: Treating RN: 1976-10-05 (47 y.o. Male) Brenton Grills Primary Care Provider: Richarda Blade Other Clinician: Referring Provider: Treating Provider/Extender: Guy Franco in Treatment: 0 Debridement Performed for Assessment: Wound #2 Right,Anterior Lower Leg Performed By: Physician Duanne Guess, MD The following information was scribed by: Brenton Grills The information was scribed for: Duanne Guess Debridement Type: Debridement Severity of Tissue Pre Debridement: Fat layer exposed Level of Consciousness (Pre-procedure): Awake and Alert Pre-procedure Verification/Time Out Yes - 08:15 Taken: Start Time: 08:16 Pain Control: Lidocaine 4% T opical Solution Percent of Wound Bed  Debrided: 100% T Area Debrided (cm): otal 14.84 Tissue and other material debrided: Non-Viable, Slough, Subcutaneous, Slough Level: Skin/Subcutaneous Tissue Debridement Description: Excisional Instrument: Curette Specimen: Tissue Culture Number of Specimens T aken: 1 Bleeding: Minimum Hemostasis Achieved: Pressure Response to Treatment: Procedure was tolerated well Level of Consciousness (Post- Awake and Alert procedure): Post Debridement Measurements of Total Wound Length: (cm) 4.2 Width: (cm) 4.5 Fok, Astin B ([id number]) [id number] Page 2 of 11 Depth: (cm) 0.1 Volume: (cm) 1.484 Character of Wound/Ulcer Post Debridement: Stable Severity of Tissue Post Debridement: Fat layer exposed Post Procedure Diagnosis Same as Pre-procedure Electronic Signature(s) Signed: 08/20/2023 9:16:50 AM By: Duanne Guess MD FACS Signed: 08/21/2023 12:08:28 PM By: Brenton Grills Entered By: Brenton Grills on 08/20/2023 09:14:57 -------------------------------------------------------------------------------- HPI Details Patient Name: Date of Service: [name], [name] 08/20/2023 8:00 A M Medical Record Number: [id number] Patient Account Number: [id number] Date of Birth/Sex: Treating RN: [birth year] (47 y.o. Male) Primary Care Provider: Richarda Blade Other Clinician: Referring Provider: Treating Provider/Extender: Guy Franco in Treatment: 0 History of Present Illness HPI Description: Admission 11/23/2018 This is a 47 year old man with type 2 diabetes. He tells [place] that about a week before Thanksgiving he noticed a small painful area on the back of his left mid calf. He saw a physician at urgent care on 10/10/2018 noted to have an open wound on the left calf. He was given Bactroban and Keflex as CandS was apparently negative. He saw his own primary doctor on 612/31/19 and was given Silvadene and doxycycline. He has been using Silvadene since  then. He is saw his primary physician again on 1/10 was offered an Radio broadcast assistant but I do not think that he actually use this. The area in question is on the left posterior calf. There is some question that this was a insect bite and when he first presented but he certainly did not notice his any particular bite injury at that time. This did actually apparently at one

## 2023-08-21 NOTE — Progress Notes (Signed)
Provider: Richarda Blade FNP-C  Miqueas Whilden, [name], NP  Patient Care Team: Asyria Kolander, [name], NP as PCP - General (Family Medicine) [name], [name], PA-C (Family Medicine)  Extended Emergency Contact Information Primary Emergency Contact: [name] of [place] Home Phone: [phone] Relation: Spouse  Code Status:  Full Code  Goals of care: Advanced Directive information    08/21/2023   10:49 AM  Advanced Directives  Does Patient Have a Medical Advance Directive? No     Chief Complaint  Patient presents with   Follow-up    HPI:  Pt is a 47 y.o. male seen today for an acute visit for follow up high blood pressure and blood sugar.  He was here 08/06/2023 to establish care had elevated blood pressure 160/120 and recheck was 160/110 clonidine was administered and was started on metoprolol titrate and advised to continue on valsartan and spironolactone.  He was advised to check blood pressure at home and record then follow-up today for reevaluation.He denies any headache,dizziness,vision changes,fatigue,chest tightness,palpitation,chest pain or shortness of breath.    He was not checking his blood sugars at home was advised also to check and bring log for evaluation today.  He forgot to bring log.     Past Medical History:  Diagnosis Date   ADHD (attention deficit hyperactivity disorder)    Anxiety    Depression    Hypertension    Migraine    Nasal congestion 10/03/2014   Non-insulin dependent type 2 diabetes mellitus (HCC)    Obesity    Sleep apnea    Wears contact lenses    Past Surgical History:  Procedure Laterality Date   CHOLECYSTECTOMY N/A 10/09/2014   Procedure: LAPAROSCOPIC CHOLECYSTECTOMY;  Surgeon: [name], MD;  Location: [facility];  Service: General;  Laterality: N/A;    No Known Allergies  Outpatient Encounter Medications as of 08/21/2023  Medication Sig   atorvastatin (LIPITOR) 10 MG tablet Take 1  tablet (10 mg total) by mouth daily.   Blood Pressure KIT 1 each by Does not apply route daily.   metoprolol tartrate (LOPRESSOR) 25 MG tablet Take 1 tablet (25 mg total) by mouth 2 (two) times daily.   spironolactone (ALDACTONE) 50 MG tablet Take 50 mg by mouth daily.   tadalafil (CIALIS) 5 MG tablet TAKE 1 TABLET BY MOUTH DAILY AS NEEDED FOR ERECTILE DYSFUNCTION.   valsartan (DIOVAN) 160 MG tablet Take 1 tablet (160 mg total) by mouth daily.   VYVANSE 20 MG capsule Take 20 mg by mouth daily.   VYVANSE 50 MG capsule Take 50 mg by mouth daily.   [EXPIRED] magnesium oxide (MAG-OX) 400 MG tablet Take 1 tablet (400 mg total) by mouth daily for 5 days.   potassium chloride SA (KLOR-CON M) 20 MEQ tablet Take 1 tablet (20 mEq total) by mouth daily for 5 days.   Facility-Administered Encounter Medications as of 08/21/2023  Medication   cloNIDine (CATAPRES) tablet 0.1 mg    Review of Systems  Constitutional:  Negative for appetite change, chills, fatigue, fever and unexpected weight change.  Eyes:  Negative for pain, discharge, redness, itching and visual disturbance.  Respiratory:  Negative for cough, chest tightness, shortness of breath and wheezing.   Cardiovascular:  Negative for chest pain, palpitations and leg swelling.  Gastrointestinal:  Negative for abdominal distention, abdominal pain, blood in stool, constipation, diarrhea, nausea and vomiting.  Endocrine: Negative for cold intolerance, heat intolerance, polydipsia, polyphagia and polyuria.  Skin:  Positive for wound.  Negative for color change, pallor and rash.       Right leg wound dressing intact.  Managed by wound care  Neurological:  Negative for dizziness, syncope, speech difficulty, weakness, light-headedness, numbness and headaches.    Immunization History  Administered Date(s) Administered   Influenza,inj,Quad PF,6+ Mos 07/05/2015, 08/19/2016, 07/29/2019, 10/17/2021   PFIZER(Purple Top)SARS-COV-2 Vaccination 03/29/2020,  04/19/2020, 08/19/2021   Pneumococcal Polysaccharide-23 05/25/2017   Tdap 05/25/2017   Pertinent  Health Maintenance Due  Topic Date Due   OPHTHALMOLOGY EXAM  09/18/2019   FOOT EXAM  10/17/2022   INFLUENZA VACCINE  06/04/2023   Colonoscopy  Never done   HEMOGLOBIN A1C  02/04/2024      09/17/2018    2:00 PM 10/07/2019   10:27 AM 10/17/2021    1:58 PM 08/06/2023    1:04 PM 08/21/2023   10:49 AM  Fall Risk  Falls in the past year? 0 0 0 0 0  Was there an injury with Fall?   0    Fall Risk Category Calculator   0    Fall Risk Category (Retired)   Low    (RETIRED) Patient Fall Risk Level   Low fall risk    Patient at Risk for Falls Due to   No Fall Risks    Fall risk Follow up   Falls evaluation completed     Functional Status Survey:    Vitals:   08/21/23 1050  BP: 136/80  Pulse: 92  Resp: 18  Temp: 97.6 F (36.4 C)  SpO2: 98%  Weight: (!) 335 lb 3.2 oz (152 kg)  Height: 5\' 9"  (1.753 m)   Body mass index is 49.5 kg/m. Physical Exam Vitals reviewed.  Constitutional:      General: He is not in acute distress.    Appearance: Normal appearance. He is morbidly obese. He is not ill-appearing or diaphoretic.  HENT:     Head: Normocephalic.  Eyes:     General: No scleral icterus.       Right eye: No discharge.        Left eye: No discharge.     Conjunctiva/sclera: Conjunctivae normal.     Pupils: Pupils are equal, round, and reactive to light.  Neck:     Vascular: No carotid bruit.  Cardiovascular:     Rate and Rhythm: Normal rate and regular rhythm.     Pulses: Normal pulses.     Heart sounds: Normal heart sounds. No murmur heard.    No friction rub. No gallop.  Pulmonary:     Effort: Pulmonary effort is normal. No respiratory distress.     Breath sounds: Normal breath sounds. No wheezing, rhonchi or rales.  Chest:     Chest wall: No tenderness.  Abdominal:     General: Bowel sounds are normal. There is no distension.     Palpations: Abdomen is soft. There is  no mass.     Tenderness: There is no abdominal tenderness. There is no right CVA tenderness, left CVA tenderness, guarding or rebound.  Musculoskeletal:        General: No swelling or tenderness. Normal range of motion.     Cervical back: Normal range of motion. No rigidity or tenderness.     Right lower leg: No edema.     Left lower leg: No edema.  Lymphadenopathy:     Cervical: No cervical adenopathy.  Skin:    General: Skin is warm and dry.     Coloration: Skin is not pale.  Findings: No erythema or rash.     Comments: Right wound not visualized at this visit dressing dry clean and intact  Neurological:     Mental Status: He is alert and oriented to person, place, and time.     Motor: No weakness.     Gait: Gait normal.  Psychiatric:        Mood and Affect: Mood normal.        Speech: Speech normal.        Behavior: Behavior normal.     Labs reviewed: Recent Labs    08/04/23 0831 08/06/23 1426  NA 136 140  K 2.7* 3.6  CL 100 101  CO2 26 27  GLUCOSE 150* 98  BUN 25* 20  CREATININE 1.38* 1.56*  CALCIUM 8.3* 8.8  MG 1.6*  --    Recent Labs    08/06/23 1426  AST 16  ALT 19  BILITOT 1.0  PROT 6.4   Recent Labs    08/04/23 0831 08/06/23 1426  WBC 4.6 4.1  NEUTROABS 3.0 2,698  HGB 13.3 13.9  HCT 37.0* 41.7  MCV 80.6 86.0  PLT 204 121*   Lab Results  Component Value Date   TSH 1.18 08/06/2023   Lab Results  Component Value Date   HGBA1C 6.7 (H) 08/06/2023   Lab Results  Component Value Date   CHOL 165 08/06/2023   HDL 34 (L) 08/06/2023   LDLCALC 104 (H) 08/06/2023   TRIG 157 (H) 08/06/2023   CHOLHDL 4.9 08/06/2023    Significant Diagnostic Results in last 30 days:  DG Tibia/Fibula Right  Result Date: 08/04/2023 CLINICAL DATA:  Right lower extremity wound for the past week. Evaluate for osteomyelitis. EXAM: RIGHT TIBIA AND FIBULA - 2 VIEW COMPARISON:  Right ankle radiographs-05/14/2018 FINDINGS: No fracture or dislocation. Limited  visualization of the knee demonstrates enthesopathic change involving the tibial tuberosity as well as the superior and inferior poles of the patella. Limited visualization of the ankle suggests mild degenerative change of the tibiotalar joint with potential subchondral cysts, incompletely evaluated. There is diffuse soft tissue swelling about the lower leg without subcutaneous emphysema. No radiopaque foreign body. No discrete areas of osteolysis to suggest osteomyelitis. IMPRESSION: Diffuse soft tissue swelling about the lower leg without subcutaneous emphysema or discrete areas of osteolysis to suggest osteomyelitis. If there is high clinical concern for osteomyelitis, further evaluation with MRI could be performed as indicated. Electronically Signed   By: Simonne Come M.D.   On: 08/04/2023 08:59    Assessment/Plan  1. Type 2 diabetes mellitus with obesity (HCC) Lab Results  Component Value Date   HGBA1C 6.7 (H) 08/06/2023  No home CBG for review  - Hemoglobin A1c; Future - Semaglutide,0.25 or 0.5MG /DOS, 2 MG/3ML SOPN; Inject 0.5 mg into the skin once a week.  Dispense: 3 mL; Refill: 1  2. Essential hypertension B/p has improved this visit -Will continue on valsartan, spironolactone and metoprolol -Dietary modification and exercise advised - COMPLETE METABOLIC PANEL WITH GFR; Future - CBC with Differential/Platelet; Future  3. Hyperlipidemia LDL goal <100 LDL not at goal -Dietary modification and exercise as above -Atorvastatin 10 mg daily - Lipid panel; Future   Family/ staff Communication: Reviewed plan of care with patient verbalized understanding  Labs/tests ordered:  - Hemoglobin A1c; Future - Lipid panel; Future - COMPLETE METABOLIC PANEL WITH GFR; Future - CBC with Differential/Platelet; Future  Next Appointment: Return in about 4 months (around 12/22/2023) for medical mangement of chronic issues., fasting labs prior  to visit.one month for F/U blood sugars .    Caesar Bookman, NP

## 2023-08-25 DIAGNOSIS — F902 Attention-deficit hyperactivity disorder, combined type: Secondary | ICD-10-CM | POA: Diagnosis not present

## 2023-08-27 DIAGNOSIS — G4733 Obstructive sleep apnea (adult) (pediatric): Secondary | ICD-10-CM | POA: Diagnosis not present

## 2023-08-28 ENCOUNTER — Ambulatory Visit: Payer: BC Managed Care – PPO | Admitting: Psychology

## 2023-08-28 ENCOUNTER — Encounter (HOSPITAL_BASED_OUTPATIENT_CLINIC_OR_DEPARTMENT_OTHER): Payer: BC Managed Care – PPO | Admitting: General Surgery

## 2023-08-28 DIAGNOSIS — G4733 Obstructive sleep apnea (adult) (pediatric): Secondary | ICD-10-CM | POA: Diagnosis not present

## 2023-08-28 DIAGNOSIS — Z9049 Acquired absence of other specified parts of digestive tract: Secondary | ICD-10-CM | POA: Diagnosis not present

## 2023-08-28 DIAGNOSIS — L97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 with unspecified severity: Secondary | ICD-10-CM | POA: Diagnosis not present

## 2023-08-28 DIAGNOSIS — K760 Fatty (change of) liver, not elsewhere classified: Secondary | ICD-10-CM | POA: Diagnosis not present

## 2023-08-28 DIAGNOSIS — Z79899 Other long term (current) drug therapy: Secondary | ICD-10-CM | POA: Diagnosis not present

## 2023-08-28 DIAGNOSIS — E11622 Type 2 diabetes mellitus with other skin ulcer: Secondary | ICD-10-CM | POA: Diagnosis not present

## 2023-08-28 DIAGNOSIS — I10 Essential (primary) hypertension: Secondary | ICD-10-CM | POA: Diagnosis not present

## 2023-08-28 DIAGNOSIS — L97812 Non-pressure chronic ulcer of other part of right lower leg with fat layer exposed: Secondary | ICD-10-CM | POA: Diagnosis not present

## 2023-08-28 DIAGNOSIS — Z6841 Body Mass Index (BMI) 40.0 and over, adult: Secondary | ICD-10-CM | POA: Diagnosis not present

## 2023-08-28 DIAGNOSIS — E785 Hyperlipidemia, unspecified: Secondary | ICD-10-CM | POA: Diagnosis not present

## 2023-08-28 NOTE — Progress Notes (Signed)
polymicrobial population but all organisms were at fairly low levels. I used a curette to debride slough and subcutaneous tissue from the wound. Although I do not think at this point he needs a systemic antibiotic, I am going to add topical gentamicin to the wound along with the Iodoflex, based upon his culture data. We will continue Urgo light compression 3 layer equivalent. Follow-up in 1 week. Electronic Signature(s) Signed: 08/28/2023 12:25:06 PM By: [name] Guess MD FACS Entered By: [name] [name] on 08/28/2023 09:25:05 -------------------------------------------------------------------------------- HxROS Details Patient Name: Date of Service: [name] [name], [name] [name]. 08/28/2023 11:15 A M Medical Record Number: [id number] Patient Account Number: [id number] Date of Birth/Sex: Treating RN: 08-29-1976 (47 y.o. M) Primary Care Provider: Richarda [name] Other Clinician: Referring Provider: Treating Provider/Extender: [name] [name], [name] [name] in Treatment: 1 Information Obtained From Patient Chart [name] [name] [name] ([id number]) [id number]  Page 8 of 10 Constitutional Symptoms (General Health) Medical History: Past Medical History Notes: morbid obesity, migraines Eyes Medical History: Negative for: Cataracts; Glaucoma; Optic Neuritis Ear/Nose/Mouth/Throat Medical History: Negative for: Chronic sinus problems/congestion; Middle ear problems Past Medical History Notes: nasal congestion Hematologic/Lymphatic Medical History: Negative for: Anemia; Hemophilia; Human Immunodeficiency Virus; Lymphedema; Sickle Cell Disease Respiratory Medical History: Positive for: Sleep Apnea - CPAP Negative for: Aspiration; Asthma; Chronic Obstructive Pulmonary Disease (COPD); Pneumothorax; Tuberculosis Cardiovascular Medical History: Positive for: Hypertension - [name]/p has been running high due to running out of medication Negative for: Angina; Arrhythmia; Congestive Heart Failure; Coronary Artery Disease; Deep Vein Thrombosis; Hypotension; Myocardial Infarction; Peripheral Arterial Disease; Peripheral Venous Disease; Phlebitis; Vasculitis Past Medical History Notes: hyperlipidemia Gastrointestinal Medical History: Negative for: Cirrhosis ; Colitis; Crohns; Hepatitis A; Hepatitis [name]; Hepatitis C Endocrine Medical History: Positive for: Type II Diabetes Negative for: Type I Diabetes Past Medical History Notes: hypogonadism, low testosterone Time with diabetes: 1 year Treated with: Oral agents Blood sugar tested every day: No Genitourinary Medical History: Negative for: End Stage Renal Disease Past Medical History Notes: CKD Immunological Medical History: Negative for: Lupus Erythematosus; Raynauds; Scleroderma Integumentary (Skin) Medical History: Negative for: History of Burn Past Medical History Notes: hand dermatitis Musculoskeletal Medical History: Negative for: Gout; Rheumatoid Arthritis; Osteoarthritis; Osteomyelitis Neurologic [name] [name], [name] [name] ([id number]) [id number] Page 9 of 10 Medical  History: Negative for: Dementia; Neuropathy; Quadriplegia; Paraplegia; Seizure Disorder Oncologic Medical History: Negative for: Received Chemotherapy; Received Radiation Psychiatric Medical History: Negative for: Anorexia/bulimia; Confinement Anxiety Past Medical History Notes: anxiety, depression, ADHD Immunizations Pneumococcal Vaccine: Received Pneumococcal Vaccination: Yes Received Pneumococcal Vaccination On or After 60th Birthday: No Tetanus Vaccine: Last tetanus shot: 05/25/2017 Implantable Devices None Hospitalization / Surgery History Type of Hospitalization/Surgery 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 Family and Social History Cancer: Yes - Maternal Grandparents; Diabetes: Yes - Paternal Grandparents; Heart Disease: No; Hereditary Spherocytosis: No; Hypertension: Yes - Mother; Kidney Disease: Yes - Father; Lung Disease: No; Seizures: No; Stroke: Yes - Paternal Grandparents; Thyroid Problems: No; Tuberculosis: No; Never smoker; Marital Status - Married; Alcohol Use: Moderate; Drug Use: Prior History - TCH in college; Caffeine Use: Daily - coffee; Financial Concerns: No; Food, Clothing or Shelter Needs: No; Support System Lacking: No; Transportation Concerns: No Electronic Signature(s) Signed: 08/28/2023 12:25:45 PM By: [name] Guess MD FACS Entered By: [name] [name] on 08/28/2023 09:22:25 -------------------------------------------------------------------------------- SuperBill Details Patient Name: Date of Service: [name] [name], [name] [name]. 08/28/2023 Medical Record Number: [id number] Patient Account Number: [id number] Date of Birth/Sex: Treating RN: Jul 05, 1976 (47 y.o. M) Primary Care Provider: Richarda [name] Other Clinician: Referring Provider: Treating Provider/Extender: [name] [name]  Care Provider: Richarda [name] Other Clinician: Referring Provider: Treating Provider/Extender: [name] [name] [name], [name] [name] in Treatment: 1 Constitutional Hypertensive, asymptomatic. . . . no acute distress. Respiratory Normal work of breathing on room air.. Notes 08/28/2023: The wound measured a little bit larger today and has a nonviable surface. There is still an odor coming from the wound. Edema control is good. Electronic Signature(s) Signed: 08/28/2023 12:23:09 PM By: [name] Guess MD FACS Entered By: [name] [name] on 08/28/2023 09:23:09 -------------------------------------------------------------------------------- Physician Orders Details Patient Name: Date of Service: [name] [name], [name] [name]. 08/28/2023 11:15 A M Medical Record Number: [id number] Patient Account Number: [id number] Date of Birth/Sex: Treating RN: 1976/10/19 (47 y.o. [name] [name] Primary Care Provider: Richarda [name] Other Clinician: Referring Provider: Treating Provider/Extender: [name] [name], [name] [name] in Treatment: 1 Verbal / Phone Orders: No Diagnosis Coding ICD-10 Coding Code Description L97.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 with unspecified severity E11.622 Type 2 diabetes mellitus with other skin ulcer E66.01 Morbid (severe) obesity due to excess calories Follow-up Appointments ppointment in 1 week. - Dr. Lady Gary Room 2 09/02/23 8:30am Return A Anesthetic (In clinic) Topical Lidocaine 4% applied to wound bed - in clinic Bathing/ Shower/ Hygiene May shower with protection but do not get wound dressing(s) wet. Protect dressing(s) with water repellant cover (for example, large plastic bag) or a cast cover and may then take  shower. Wound Treatment Wound #2 - Lower Leg Wound Laterality: Right, Anterior Cleanser: Soap and Water 1 x Per Week/30 Days Discharge Instructions: May shower and wash wound with dial antibacterial soap and water prior to dressing change. Cleanser: Vashe 5.8 (oz) 1 x Per Week/30 Days Discharge Instructions: Cleanse the wound with Vashe prior to applying a clean dressing using gauze sponges, not tissue or cotton balls. [name] [name], [name] [name] ([id number]) [id number] Page 4 of 10 Peri-Wound Care: Sween Lotion (Moisturizing lotion) 1 x Per Week/30 Days Discharge Instructions: Apply moisturizing lotion as directed Topical: Gentamicin 1 x Per Week/30 Days Discharge Instructions: As directed by physician Prim Dressing: IODOFLEX 0.9% Cadexomer Iodine Pad 4x6 cm 1 x Per Week/30 Days ary Discharge Instructions: Apply to wound bed as instructed Secondary Dressing: ABD Pad, 8x10 1 x Per Week/30 Days Discharge Instructions: Apply over primary dressing as directed. Compression Wrap: Urgo K2 Lite, (equivalent to a 3 layer) two layer compression system, regular 1 x Per Week/30 Days Discharge Instructions: Apply Urgo K2 Lite as directed (alternative to 3 layer compression). Electronic Signature(s) Signed: 08/28/2023 12:23:51 PM By: [name] Guess MD FACS Entered By: [name] [name] on 08/28/2023 09:23:50 -------------------------------------------------------------------------------- Problem List Details Patient Name: Date of Service: [name] [name], [name] [name]. 08/28/2023 11:15 A M Medical Record Number: [id number] Patient Account Number: [id number] Date of Birth/Sex: Treating RN: Dec 19, 1975 (47 y.o. M) Primary Care Provider: Richarda [name] Other Clinician: Referring Provider: Treating Provider/Extender: [name] [name], [name] [name] in Treatment: 1 Active Problems ICD-10 Encounter Code Description Active Date MDM Diagnosis L97.819 Non-pressure chronic ulcer of other part of  right lower leg with unspecified 08/20/2023 No Yes severity E11.622 Type 2 diabetes mellitus with other skin ulcer 08/20/2023 No Yes E66.01 Morbid (severe) obesity due to excess calories 08/20/2023 No Yes Inactive Problems Resolved Problems Electronic Signature(s) Signed: 08/28/2023 12:20:57 PM By: [name] Guess MD FACS Entered By: [name] [name] on 08/28/2023 09:20:57 [name] [name] ([id number]) [id number] Page 5 of 10 -------------------------------------------------------------------------------- Progress Note Details Patient Name: Date of Service: [name] [name]. 08/28/2023 11:15 A M Medical Record Number: [id number] Patient  Account Number: [id number] Date of Birth/Sex: Treating RN: [birth year] (47 y.o. M) Primary Care Provider: Richarda [name] Other Clinician: Referring Provider: Treating Provider/Extender: [name] [name], [name] [name] in Treatment: 1 Subjective Chief Complaint Information obtained from Patient 11/23/2018; patient is here for review of an ulcer on the left posterior calf 10/06/2019; patient is here for review of the wound area from the previous visit which is on the left posterior lateral calf 08/20/2023: here for new wound on RLE anterior History of Present Illness (HPI) Admission 11/23/2018 This is a 47 year old man with type 2 diabetes. He tells [place] that about a week before Thanksgiving he noticed a small painful area on the back of his left mid calf. He saw a physician at urgent care on 10/10/2018 noted to have an open wound on the left calf. He was given Bactroban and Keflex as CandS was apparently negative. He saw his own primary doctor on 612/31/19 and was given Silvadene and doxycycline. He has been using Silvadene since then. He is saw his primary physician again on 1/10 was offered an Radio broadcast assistant but I do not think that he actually use this. The area in question is on the left posterior calf. There is some question that  this was a insect bite and when he first presented but he certainly did not notice his any particular bite injury at that time. This did actually apparently at one point look as though it was progressing towards closure although it then it opened again. He has been using Silvadene for the past 3 [name] Past medical history; obstructive sleep apnea, Achilles tendinitis, hypertension, type 2 diabetes, cholecystectomy, fatty liver, ABI in this clinic was 1.38 on the left 11/30/2018; the area is essentially unchanged in size although the surface of the wound looks better. He has surrounding inflammation around the wound the exact etiology of this is unclear. 12/07/2018; the area has come down in diameter. Surface requires debridement. We have been using silver collagen. Is the wound seems to have come down in size I have elected not to biopsy this. Surrounding inflammation is also less 2/11; the area continues to come down in size and generally looks healthy. We have been using silver collagen under compression surrounding inflammation is less there is no tenderness 2/18; the area continues to contract in size and looks healthy. We have been using silver collagen under compression 2/25; no major change in dimension this week. However the wound bed continues to look healthy and there appears to be less inflammation around the wound 3/3; the wound measures slightly smaller. Healthy looking surface. Less inflammation 3/10; wound is closed. Healthy looking surface. Still some discoloration around the wound area but absolutely no tenderness. The cause of this wound was never really determined. However it progressed nicely towards healing READMISSION 10/06/2019 This is a patient I dealt with with an unusual wound on the left posterior lateral calf in the early part of this year. I was never really completely sure I understood what this was in terms of causation. However wde use silver collagen under compression  and the area responded nicely and we are able to discharge him after roughly 6 [name] in the clinic. He did not clearly have arterial or venous issues. I had some thoughts about biopsying this area however acid continued to get better I did not go through with this. He states that everything is been fine up until the last few [name]. His wife noted a scab with perhaps some purulent drainage. They  polymicrobial population but all organisms were at fairly low levels. I used a curette to debride slough and subcutaneous tissue from the wound. Although I do not think at this point he needs a systemic antibiotic, I am going to add topical gentamicin to the wound along with the Iodoflex, based upon his culture data. We will continue Urgo light compression 3 layer equivalent. Follow-up in 1 week. Electronic Signature(s) Signed: 08/28/2023 12:25:06 PM By: [name] Guess MD FACS Entered By: [name] [name] on 08/28/2023 09:25:05 -------------------------------------------------------------------------------- HxROS Details Patient Name: Date of Service: [name] [name], [name] [name]. 08/28/2023 11:15 A M Medical Record Number: [id number] Patient Account Number: [id number] Date of Birth/Sex: Treating RN: 08-29-1976 (47 y.o. M) Primary Care Provider: Richarda [name] Other Clinician: Referring Provider: Treating Provider/Extender: [name] [name], [name] [name] in Treatment: 1 Information Obtained From Patient Chart [name] [name] [name] ([id number]) [id number]  Page 8 of 10 Constitutional Symptoms (General Health) Medical History: Past Medical History Notes: morbid obesity, migraines Eyes Medical History: Negative for: Cataracts; Glaucoma; Optic Neuritis Ear/Nose/Mouth/Throat Medical History: Negative for: Chronic sinus problems/congestion; Middle ear problems Past Medical History Notes: nasal congestion Hematologic/Lymphatic Medical History: Negative for: Anemia; Hemophilia; Human Immunodeficiency Virus; Lymphedema; Sickle Cell Disease Respiratory Medical History: Positive for: Sleep Apnea - CPAP Negative for: Aspiration; Asthma; Chronic Obstructive Pulmonary Disease (COPD); Pneumothorax; Tuberculosis Cardiovascular Medical History: Positive for: Hypertension - [name]/p has been running high due to running out of medication Negative for: Angina; Arrhythmia; Congestive Heart Failure; Coronary Artery Disease; Deep Vein Thrombosis; Hypotension; Myocardial Infarction; Peripheral Arterial Disease; Peripheral Venous Disease; Phlebitis; Vasculitis Past Medical History Notes: hyperlipidemia Gastrointestinal Medical History: Negative for: Cirrhosis ; Colitis; Crohns; Hepatitis A; Hepatitis [name]; Hepatitis C Endocrine Medical History: Positive for: Type II Diabetes Negative for: Type I Diabetes Past Medical History Notes: hypogonadism, low testosterone Time with diabetes: 1 year Treated with: Oral agents Blood sugar tested every day: No Genitourinary Medical History: Negative for: End Stage Renal Disease Past Medical History Notes: CKD Immunological Medical History: Negative for: Lupus Erythematosus; Raynauds; Scleroderma Integumentary (Skin) Medical History: Negative for: History of Burn Past Medical History Notes: hand dermatitis Musculoskeletal Medical History: Negative for: Gout; Rheumatoid Arthritis; Osteoarthritis; Osteomyelitis Neurologic [name] [name], [name] [name] ([id number]) [id number] Page 9 of 10 Medical  History: Negative for: Dementia; Neuropathy; Quadriplegia; Paraplegia; Seizure Disorder Oncologic Medical History: Negative for: Received Chemotherapy; Received Radiation Psychiatric Medical History: Negative for: Anorexia/bulimia; Confinement Anxiety Past Medical History Notes: anxiety, depression, ADHD Immunizations Pneumococcal Vaccine: Received Pneumococcal Vaccination: Yes Received Pneumococcal Vaccination On or After 60th Birthday: No Tetanus Vaccine: Last tetanus shot: 05/25/2017 Implantable Devices None Hospitalization / Surgery History Type of Hospitalization/Surgery 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 Family and Social History Cancer: Yes - Maternal Grandparents; Diabetes: Yes - Paternal Grandparents; Heart Disease: No; Hereditary Spherocytosis: No; Hypertension: Yes - Mother; Kidney Disease: Yes - Father; Lung Disease: No; Seizures: No; Stroke: Yes - Paternal Grandparents; Thyroid Problems: No; Tuberculosis: No; Never smoker; Marital Status - Married; Alcohol Use: Moderate; Drug Use: Prior History - TCH in college; Caffeine Use: Daily - coffee; Financial Concerns: No; Food, Clothing or Shelter Needs: No; Support System Lacking: No; Transportation Concerns: No Electronic Signature(s) Signed: 08/28/2023 12:25:45 PM By: [name] Guess MD FACS Entered By: [name] [name] on 08/28/2023 09:22:25 -------------------------------------------------------------------------------- SuperBill Details Patient Name: Date of Service: [name] [name], [name] [name]. 08/28/2023 Medical Record Number: [id number] Patient Account Number: [id number] Date of Birth/Sex: Treating RN: Jul 05, 1976 (47 y.o. M) Primary Care Provider: Richarda [name] Other Clinician: Referring Provider: Treating Provider/Extender: [name] [name]  Care Provider: Richarda [name] Other Clinician: Referring Provider: Treating Provider/Extender: [name] [name] [name], [name] [name] in Treatment: 1 Constitutional Hypertensive, asymptomatic. . . . no acute distress. Respiratory Normal work of breathing on room air.. Notes 08/28/2023: The wound measured a little bit larger today and has a nonviable surface. There is still an odor coming from the wound. Edema control is good. Electronic Signature(s) Signed: 08/28/2023 12:23:09 PM By: [name] Guess MD FACS Entered By: [name] [name] on 08/28/2023 09:23:09 -------------------------------------------------------------------------------- Physician Orders Details Patient Name: Date of Service: [name] [name], [name] [name]. 08/28/2023 11:15 A M Medical Record Number: [id number] Patient Account Number: [id number] Date of Birth/Sex: Treating RN: 1976/10/19 (47 y.o. [name] [name] Primary Care Provider: Richarda [name] Other Clinician: Referring Provider: Treating Provider/Extender: [name] [name], [name] [name] in Treatment: 1 Verbal / Phone Orders: No Diagnosis Coding ICD-10 Coding Code Description L97.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 with unspecified severity E11.622 Type 2 diabetes mellitus with other skin ulcer E66.01 Morbid (severe) obesity due to excess calories Follow-up Appointments ppointment in 1 week. - Dr. Lady Gary Room 2 09/02/23 8:30am Return A Anesthetic (In clinic) Topical Lidocaine 4% applied to wound bed - in clinic Bathing/ Shower/ Hygiene May shower with protection but do not get wound dressing(s) wet. Protect dressing(s) with water repellant cover (for example, large plastic bag) or a cast cover and may then take  shower. Wound Treatment Wound #2 - Lower Leg Wound Laterality: Right, Anterior Cleanser: Soap and Water 1 x Per Week/30 Days Discharge Instructions: May shower and wash wound with dial antibacterial soap and water prior to dressing change. Cleanser: Vashe 5.8 (oz) 1 x Per Week/30 Days Discharge Instructions: Cleanse the wound with Vashe prior to applying a clean dressing using gauze sponges, not tissue or cotton balls. [name] [name], [name] [name] ([id number]) [id number] Page 4 of 10 Peri-Wound Care: Sween Lotion (Moisturizing lotion) 1 x Per Week/30 Days Discharge Instructions: Apply moisturizing lotion as directed Topical: Gentamicin 1 x Per Week/30 Days Discharge Instructions: As directed by physician Prim Dressing: IODOFLEX 0.9% Cadexomer Iodine Pad 4x6 cm 1 x Per Week/30 Days ary Discharge Instructions: Apply to wound bed as instructed Secondary Dressing: ABD Pad, 8x10 1 x Per Week/30 Days Discharge Instructions: Apply over primary dressing as directed. Compression Wrap: Urgo K2 Lite, (equivalent to a 3 layer) two layer compression system, regular 1 x Per Week/30 Days Discharge Instructions: Apply Urgo K2 Lite as directed (alternative to 3 layer compression). Electronic Signature(s) Signed: 08/28/2023 12:23:51 PM By: [name] Guess MD FACS Entered By: [name] [name] on 08/28/2023 09:23:50 -------------------------------------------------------------------------------- Problem List Details Patient Name: Date of Service: [name] [name], [name] [name]. 08/28/2023 11:15 A M Medical Record Number: [id number] Patient Account Number: [id number] Date of Birth/Sex: Treating RN: Dec 19, 1975 (47 y.o. M) Primary Care Provider: Richarda [name] Other Clinician: Referring Provider: Treating Provider/Extender: [name] [name], [name] [name] in Treatment: 1 Active Problems ICD-10 Encounter Code Description Active Date MDM Diagnosis L97.819 Non-pressure chronic ulcer of other part of  right lower leg with unspecified 08/20/2023 No Yes severity E11.622 Type 2 diabetes mellitus with other skin ulcer 08/20/2023 No Yes E66.01 Morbid (severe) obesity due to excess calories 08/20/2023 No Yes Inactive Problems Resolved Problems Electronic Signature(s) Signed: 08/28/2023 12:20:57 PM By: [name] Guess MD FACS Entered By: [name] [name] on 08/28/2023 09:20:57 [name] [name] ([id number]) [id number] Page 5 of 10 -------------------------------------------------------------------------------- Progress Note Details Patient Name: Date of Service: [name] [name]. 08/28/2023 11:15 A M Medical Record Number: [id number] Patient  towards closure although it then it opened again. He has been using Silvadene for the past 3 [name] Past medical history; obstructive sleep apnea, Achilles tendinitis, hypertension, type 2 diabetes, cholecystectomy, fatty liver, ABI in this clinic was 1.38 on the left 11/30/2018; the area is essentially unchanged in size although the surface of the wound looks better. He has surrounding inflammation around the wound the exact etiology of this is unclear. 12/07/2018; the area has come down in diameter. Surface requires debridement. We have been using silver collagen. Is the wound seems to have come down in size I have elected not to biopsy this. Surrounding inflammation is also less 2/11; the area continues to come down in size and generally looks healthy. We have been using silver collagen under compression surrounding inflammation is less there is no tenderness 2/18; the area continues to contract in size and looks healthy. We have been using silver collagen under compression 2/25; no major change in dimension this week. However the wound bed continues to look healthy and there appears to be less inflammation around the wound 3/3; the wound measures slightly smaller. Healthy looking surface. Less inflammation 3/10; wound is closed. Healthy looking surface. Still some discoloration around the wound area but absolutely no tenderness. The cause of this wound was never really determined. However it progressed nicely towards healing READMISSION 10/06/2019 This is a patient I dealt with with an unusual wound on the left posterior lateral calf in the early part of  this year. I was never really completely sure I understood what this was in terms of causation. However wde use silver collagen under compression and the area responded nicely and we are able to discharge him after roughly 6 [name] in the clinic. He did not clearly have arterial or venous issues. I had some thoughts about biopsying this area however acid continued to get better I did not go through with this. He states that everything is been fine up until the last few [name]. His wife noted a scab with perhaps some purulent drainage. They called primary care and was prescribed doxy which she is finished and everything seems to have settled down there was no open area however the patient wanted [place] to look at this. READMISSION 08/20/2023 ***ABIs L: 1.04; R: 1.11*** This is a now 47 year old type II diabetic (last hemoglobin A1c 6.7%) who returns with a new wound on his right lower extremity, anterior tibial surface. He says it began exactly the way he is previous wound that led to wound care center admission started. He was mowing the lawn and developed a fall on his leg. It subsequently broke down resulting in a large ulcer. Patient was seen in the emergency department on 04 August 2023. He was given a prescription for Keflex and Bactrim. He was referred to the wound care center for further evaluation and management. 08/28/2023: The wound measured a little bit larger today and has a nonviable surface. There is still an odor coming from the wound. Edema control is good. The culture that I took last week grew out a polymicrobial population but all organisms were not fairly low levels. Electronic Signature(s) Signed: 08/28/2023 12:22:18 PM By: [name] Guess MD FACS Entered By: [name] [name] on 08/28/2023 09:22:18 [name] [name] ([id number]) [id number] Page 3 of 10 -------------------------------------------------------------------------------- Physical Exam  Details Patient Name: Date of Service: [name] [name]. 08/28/2023 11:15 A M Medical Record Number: [id number] Patient Account Number: [id number] Date of Birth/Sex: Treating RN: 06-13-1976 (47 y.o. M) Primary  [name] [name], [name] [name] ([id number]) [id number] Page 1 of 10 Visit Report for 08/28/2023 Chief Complaint Document Details Patient Name: Date of Service: [name] [name]. 08/28/2023 11:15 A M Medical Record Number: [id number] Patient Account Number: [id number] Date of Birth/Sex: Treating RN: 1976/02/09 (47 y.o. M) Primary Care Provider: Richarda [name] Other Clinician: Referring Provider: Treating Provider/Extender: [name] [name], [name] [name] in Treatment: 1 Information Obtained from: Patient Chief Complaint 11/23/2018; patient is here for review of an ulcer on the left posterior calf 10/06/2019; patient is here for review of the wound area from the previous visit which is on the left posterior lateral calf 08/20/2023: here for new wound on RLE anterior Electronic Signature(s) Signed: 08/28/2023 12:21:13 PM By: [name] Guess MD FACS Entered By: [name] [name] on 08/28/2023 09:21:13 -------------------------------------------------------------------------------- Debridement Details Patient Name: Date of Service: [name] [name], [name] [name]. 08/28/2023 11:15 A M Medical Record Number: [id number] Patient Account Number: [id number] Date of Birth/Sex: Treating RN: 01/09/1976 (47 y.o. [name] [name] Primary Care Provider: Richarda [name] Other Clinician: Referring Provider: Treating Provider/Extender: Pleas Patricia in Treatment: 1 Debridement Performed for Assessment: Wound #2 Right,Anterior Lower Leg Performed By: Physician [name] Guess, MD The following information was scribed by: Karie Schwalbe The information was scribed for: [name] [name] Debridement Type: Debridement Severity of Tissue Pre Debridement: Fat layer exposed Level of Consciousness (Pre-procedure): Awake and Alert Pre-procedure Verification/Time Out Yes - 11:35 Taken: Start Time: 11:35 Pain Control: Lidocaine 4% T opical Solution Percent of Wound Bed Debrided:  100% T Area Debrided (cm): otal 15.7 Tissue and other material debrided: Viable, Non-Viable, Slough, Subcutaneous, Slough Level: Skin/Subcutaneous Tissue Debridement Description: Excisional Instrument: Curette Bleeding: Minimum Hemostasis Achieved: Pressure Response to Treatment: Procedure was tolerated well Level of Consciousness (Post- Awake and Alert procedure): Post Debridement Measurements of Total Wound Length: (cm) 5 Width: (cm) 4 Depth: (cm) 0.1 Volume: (cm) 1.571 [name] [name], [name] [name] ([id number]) [id number] Page 2 of 10 Character of Wound/Ulcer Post Debridement: Improved Severity of Tissue Post Debridement: Fat layer exposed Post Procedure Diagnosis Same as Pre-procedure Electronic Signature(s) Signed: 08/28/2023 12:25:45 PM By: [name] Guess MD FACS Signed: 08/28/2023 12:52:22 PM By: Karie Schwalbe RN Entered By: Karie Schwalbe on 08/28/2023 08:42:23 -------------------------------------------------------------------------------- HPI Details Patient Name: Date of Service: [name] [name], [name] [name]. 08/28/2023 11:15 A M Medical Record Number: [id number] Patient Account Number: [id number] Date of Birth/Sex: Treating RN: September 09, 1976 (47 y.o. M) Primary Care Provider: Richarda [name] Other Clinician: Referring Provider: Treating Provider/Extender: [name] [name], [name] [name] in Treatment: 1 History of Present Illness HPI Description: Admission 11/23/2018 This is a 47 year old man with type 2 diabetes. He tells [place] that about a week before Thanksgiving he noticed a small painful area on the back of his left mid calf. He saw a physician at urgent care on 10/10/2018 noted to have an open wound on the left calf. He was given Bactroban and Keflex as CandS was apparently negative. He saw his own primary doctor on 612/31/19 and was given Silvadene and doxycycline. He has been using Silvadene since then. He is saw his primary physician again on 1/10 was  offered an Radio broadcast assistant but I do not think that he actually use this. The area in question is on the left posterior calf. There is some question that this was a insect bite and when he first presented but he certainly did not notice his any particular bite injury at that time. This did actually apparently at one point look as though it was progressing  Account Number: [id number] Date of Birth/Sex: Treating RN: [birth year] (47 y.o. M) Primary Care Provider: Richarda [name] Other Clinician: Referring Provider: Treating Provider/Extender: [name] [name], [name] [name] in Treatment: 1 Subjective Chief Complaint Information obtained from Patient 11/23/2018; patient is here for review of an ulcer on the left posterior calf 10/06/2019; patient is here for review of the wound area from the previous visit which is on the left posterior lateral calf 08/20/2023: here for new wound on RLE anterior History of Present Illness (HPI) Admission 11/23/2018 This is a 47 year old man with type 2 diabetes. He tells [place] that about a week before Thanksgiving he noticed a small painful area on the back of his left mid calf. He saw a physician at urgent care on 10/10/2018 noted to have an open wound on the left calf. He was given Bactroban and Keflex as CandS was apparently negative. He saw his own primary doctor on 612/31/19 and was given Silvadene and doxycycline. He has been using Silvadene since then. He is saw his primary physician again on 1/10 was offered an Radio broadcast assistant but I do not think that he actually use this. The area in question is on the left posterior calf. There is some question that  this was a insect bite and when he first presented but he certainly did not notice his any particular bite injury at that time. This did actually apparently at one point look as though it was progressing towards closure although it then it opened again. He has been using Silvadene for the past 3 [name] Past medical history; obstructive sleep apnea, Achilles tendinitis, hypertension, type 2 diabetes, cholecystectomy, fatty liver, ABI in this clinic was 1.38 on the left 11/30/2018; the area is essentially unchanged in size although the surface of the wound looks better. He has surrounding inflammation around the wound the exact etiology of this is unclear. 12/07/2018; the area has come down in diameter. Surface requires debridement. We have been using silver collagen. Is the wound seems to have come down in size I have elected not to biopsy this. Surrounding inflammation is also less 2/11; the area continues to come down in size and generally looks healthy. We have been using silver collagen under compression surrounding inflammation is less there is no tenderness 2/18; the area continues to contract in size and looks healthy. We have been using silver collagen under compression 2/25; no major change in dimension this week. However the wound bed continues to look healthy and there appears to be less inflammation around the wound 3/3; the wound measures slightly smaller. Healthy looking surface. Less inflammation 3/10; wound is closed. Healthy looking surface. Still some discoloration around the wound area but absolutely no tenderness. The cause of this wound was never really determined. However it progressed nicely towards healing READMISSION 10/06/2019 This is a patient I dealt with with an unusual wound on the left posterior lateral calf in the early part of this year. I was never really completely sure I understood what this was in terms of causation. However wde use silver collagen under compression  and the area responded nicely and we are able to discharge him after roughly 6 [name] in the clinic. He did not clearly have arterial or venous issues. I had some thoughts about biopsying this area however acid continued to get better I did not go through with this. He states that everything is been fine up until the last few [name]. His wife noted a scab with perhaps some purulent drainage. They

## 2023-08-28 NOTE — Progress Notes (Addendum)
Number: [id number] Date of Birth/Sex: Treating RN: 1975/11/09 (47 y.o. M) Primary Care Sharis Keeran: [name] Other Clinician: Referring Brielyn Bosak: Treating Saree Krogh/Extender: Jason Nest, Dinah Weeks in Treatment: 1 Vital Signs Height(in): 69 Pulse(bpm): 99 Weight(lbs): 330 Blood Pressure(mmHg): 152/97 Body Mass Index(BMI): 48.7 Temperature(F): 98.1 Respiratory Rate(breaths/min): 18 [2:Photos:] [N/A:N/A] Right, Anterior Lower Leg N/A N/A Wound Location: Bump N/A N/A Wounding Event: Diabetic Wound/Ulcer of the Lower N/A N/A Primary Etiology: Extremity Sleep Apnea, Hypertension, Type II N/A N/A Comorbid History: Diabetes 07/18/2023 N/A N/A Date Acquired: 1 N/A N/A Weeks of Treatment: Open N/A N/A Wound Status: No N/A N/A Wound Recurrence: 5x4x0.1 N/A N/A Measurements L x W x D (cm) 15.708 N/A N/A A (cm) : rea 1.571 N/A N/A Volume (cm) : -5.80% N/A N/A % Reduction in A rea: -5.90% N/A N/A % Reduction in Volume: Grade 2 N/A N/A Classification: Medium N/A N/A Exudate A mount: Serosanguineous N/A  N/A Exudate Type: red, brown N/A N/A Exudate Color: Distinct, outline attached N/A N/A Wound Margin: Medium (34-66%) N/A N/A Granulation A mount: Red, Pink N/A N/A Granulation Quality: Medium (34-66%) N/A N/A Necrotic A mount: Eschar, Adherent Slough N/A N/A Necrotic Tissue: Fat Layer (Subcutaneous Tissue): Yes N/A N/A Exposed Structures: Medium (34-66%) N/A N/A Epithelialization: Debridement - Excisional N/A N/A Debridement: Pre-procedure Verification/Time Out 11:35 N/A N/A Taken: Lidocaine 4% Topical Solution N/A N/A Pain Control: Subcutaneous, Slough N/A N/A Tissue Debrided: Skin/Subcutaneous Tissue N/A N/A Level: 15.7 N/A N/A Debridement A (sq cm): rea Curette N/A N/A Instrument: Minimum N/A N/A Bleeding: Pressure N/A N/A Hemostasis A chieved: Procedure was tolerated well N/A N/A Debridement Treatment Response: 5x4x0.1 N/A N/A Post Debridement Measurements L x W x D (cm) 1.571 N/A N/A Post Debridement Volume: (cm) Scarring: Yes N/A N/A Periwound Skin Texture: Hemosiderin Staining: Yes N/A N/A Periwound Skin ColorMAKYLE, JASKOLKA B ([id number]) [phone].pdf Page 4 of 8 No Abnormality N/A N/A Temperature: Compression Therapy N/A N/A Procedures Performed: Debridement Treatment Notes Wound #2 (Lower Leg) Wound Laterality: Right, Anterior Cleanser Soap and Water Discharge Instruction: May shower and wash wound with dial antibacterial soap and water prior to dressing change. Vashe 5.8 (oz) Discharge Instruction: Cleanse the wound with Vashe prior to applying a clean dressing using gauze sponges, not tissue or cotton balls. Peri-Wound Care Sween Lotion (Moisturizing lotion) Discharge Instruction: Apply moisturizing lotion as directed Topical Gentamicin Discharge Instruction: As directed by physician Primary Dressing IODOFLEX 0.9% Cadexomer Iodine Pad 4x6 cm Discharge Instruction: Apply to wound bed as instructed Secondary  Dressing ABD Pad, 8x10 Discharge Instruction: Apply over primary dressing as directed. Secured With Compression Wrap Urgo K2 Lite, (equivalent to a 3 layer) two layer compression system, regular Discharge Instruction: Apply Urgo K2 Lite as directed (alternative to 3 layer compression). Compression Stockings Add-Ons Electronic Signature(s) Signed: 08/28/2023 12:21:06 PM By: Duanne Guess MD FACS Entered By: Duanne Guess on 08/28/2023 09:21:06 -------------------------------------------------------------------------------- Multi-Disciplinary Care Plan Details Patient Name: Date of Service: [name], [name] 08/28/2023 11:15 A M Medical Record Number: [id number] Patient Account Number: [id number] Date of Birth/Sex: Treating RN: 01-16-1976 (47 y.o. [name] Primary Care Chyla Schlender: [name] Other Clinician: Referring Madden Garron: Treating Carisha Kantor/Extender: Jason Nest, Dinah Weeks in Treatment: 1 Active Inactive Wound/Skin Impairment Nursing Diagnoses: Knowledge deficit related to ulceration/compromised skin integrity Goals: Patient/caregiver will verbalize understanding of skin care regimen Date Initiated: 08/20/2023 Target Resolution Date: 10/03/2023 Goal Status: Active [name], [name] ([id number]) [id number] Page 5 of 8 Interventions: Assess patient/caregiver ability to obtain necessary supplies Assess patient/caregiver ability  Urgo K2 Lite as directed (alternative to 3 layer compression). Compression Stockings Add-Ons Electronic Signature(s) Signed: 08/28/2023 12:52:22 PM By: Karie Schwalbe RN Previous Signature: 08/28/2023 11:28:18 AM Version By: Dayton Scrape Entered By: Karie Schwalbe on 08/28/2023 08:40:01 -------------------------------------------------------------------------------- Vitals Details Patient Name: Date of Service: [name], [name] 08/28/2023 11:15 A M Medical Record Number: [id number] Patient Account Number: [id number] Date of Birth/Sex: Treating RN: May 28, 1976 (47 y.o. M) Primary Care Kaiyan Luczak: [name] Other Clinician: DARRIEN, [place] ([id number]) [id number] Page 8 of 8 Referring Zachary Lovins: Treating Raeanna Soberanes/Extender: Duanne Guess [name], Dinah Weeks in Treatment: 1 Vital Signs Time Taken: 11:20 Temperature (F): 98.1 Height (in): 69 Pulse (bpm): 99 Weight (lbs): 330 Respiratory Rate (breaths/min): 18 Body Mass Index (BMI): 48.7 Blood Pressure (mmHg): 152/97 Reference Range: 80 - 120 mg / dl Electronic Signature(s) Signed: 08/28/2023 11:28:18 AM By: Dayton Scrape Entered By: Dayton Scrape on 08/28/2023 08:21:05  Urgo K2 Lite as directed (alternative to 3 layer compression). Compression Stockings Add-Ons Electronic Signature(s) Signed: 08/28/2023 12:52:22 PM By: Karie Schwalbe RN Previous Signature: 08/28/2023 11:28:18 AM Version By: Dayton Scrape Entered By: Karie Schwalbe on 08/28/2023 08:40:01 -------------------------------------------------------------------------------- Vitals Details Patient Name: Date of Service: [name], [name] 08/28/2023 11:15 A M Medical Record Number: [id number] Patient Account Number: [id number] Date of Birth/Sex: Treating RN: May 28, 1976 (47 y.o. M) Primary Care Kaiyan Luczak: [name] Other Clinician: DARRIEN, [place] ([id number]) [id number] Page 8 of 8 Referring Zachary Lovins: Treating Raeanna Soberanes/Extender: Duanne Guess [name], Dinah Weeks in Treatment: 1 Vital Signs Time Taken: 11:20 Temperature (F): 98.1 Height (in): 69 Pulse (bpm): 99 Weight (lbs): 330 Respiratory Rate (breaths/min): 18 Body Mass Index (BMI): 48.7 Blood Pressure (mmHg): 152/97 Reference Range: 80 - 120 mg / dl Electronic Signature(s) Signed: 08/28/2023 11:28:18 AM By: Dayton Scrape Entered By: Dayton Scrape on 08/28/2023 08:21:05  to perform ulcer/skin care regimen upon admission and as needed Assess ulceration(s) every visit Provide education on ulcer and skin care Screen for HBO Treatment Activities: Skin care regimen initiated : 08/20/2023 Topical wound management initiated : 08/20/2023 Notes: Electronic Signature(s) Signed: 08/28/2023 12:52:22 PM By: Karie Schwalbe RN Entered By: Karie Schwalbe on 08/28/2023 08:57:26 -------------------------------------------------------------------------------- Pain Assessment  Details Patient Name: Date of Service: [name], [name] 08/28/2023 11:15 A M Medical Record Number: [id number] Patient Account Number: [id number] Date of Birth/Sex: Treating RN: [birth year] (47 y.o. M) Primary Care Eleny Cortez: [name] Other Clinician: Referring Kabao Leite: Treating Kaamil Morefield/Extender: Jason Nest, Dinah Weeks in Treatment: 1 Active Problems Location of Pain Severity and Description of Pain Patient Has Paino No Site Locations Pain Management and Medication Current Pain Management: Electronic Signature(s) Signed: 08/28/2023 11:28:18 AM By: Dayton Scrape Entered By: Dayton Scrape on 08/28/2023 08:21:10 Silvestre Moment B ([id number]) [id number] Page 6 of 8 -------------------------------------------------------------------------------- Patient/Caregiver Education Details Patient Name: Date of Service: [name], [place] RD B. 10/25/2024andnbsp11:15 A M Medical Record Number: [id number] Patient Account Number: [id number] Date of Birth/Gender: Treating RN: 11/06/1975 (47 y.o. [name] Primary Care Physician: [name] Other Clinician: Referring Physician: Treating Physician/Extender: Pleas Patricia in Treatment: 1 Education Assessment Education Provided To: Patient Education Topics Provided Wound/Skin Impairment: Methods: Explain/Verbal Responses: State content correctly Electronic Signature(s) Signed: 08/28/2023 12:52:22 PM By: Karie Schwalbe RN Entered By: Karie Schwalbe on 08/28/2023 09:48:53 -------------------------------------------------------------------------------- Wound Assessment Details Patient Name: Date of Service: [name], [name] 08/28/2023 11:15 A M Medical Record Number: [id number] Patient Account Number: [id number] Date of Birth/Sex: Treating RN: 1975-12-04 (47 y.o. M) Primary Care Enid Maultsby: [name] Other Clinician: Referring Zeba Luby: Treating Aracelis Ulrey/Extender:  Jason Nest, Dinah Weeks in Treatment: 1 Wound Status Wound Number: 2 Primary Etiology: Diabetic Wound/Ulcer of the Lower Extremity Wound Location: Right, Anterior Lower Leg Wound Status: Open Wounding Event: Bump Comorbid History: Sleep Apnea, Hypertension, Type II Diabetes Date Acquired: 07/18/2023 Weeks Of Treatment: 1 Clustered Wound: No Photos Wound Measurements Length: (cm) 5 Width: (cm) 4 Depth: (cm) 0.1 Area: (cm) 15.708 Volume: (cm) 1.571 % Reduction in Area: -5.8% % Reduction in Volume: -5.9% Epithelialization: Medium (34-66%) Tunneling: No Undermining: No Wound Description Lanting, Mylik B ([id number]) Classification: Grade 2 Wound Margin: Distinct, outline attached Exudate Amount: Medium Exudate Type: Serosanguineous Exudate Color: red, brown [id number] Page 7 of 8 Foul Odor After Cleansing: No Slough/Fibrino Yes Wound Bed Granulation Amount: Medium (34-66%) Exposed Structure Granulation Quality: Red, Pink Fat Layer (Subcutaneous Tissue) Exposed: Yes Necrotic Amount: Medium (34-66%) Necrotic Quality: Eschar, Adherent Slough Periwound Skin Texture Texture Color No Abnormalities Noted: No No Abnormalities Noted: No Scarring: Yes Hemosiderin Staining: Yes Moisture Temperature / Pain No Abnormalities Noted: No Temperature: No Abnormality Treatment Notes Wound #2 (Lower Leg) Wound Laterality: Right, Anterior Cleanser Soap and Water Discharge Instruction: May shower and wash wound with dial antibacterial soap and water prior to dressing change. Vashe 5.8 (oz) Discharge Instruction: Cleanse the wound with Vashe prior to applying a clean dressing using gauze sponges, not tissue or cotton balls. Peri-Wound Care Sween Lotion (Moisturizing lotion) Discharge Instruction: Apply moisturizing lotion as directed Topical Gentamicin Discharge Instruction: As directed by physician Primary Dressing IODOFLEX 0.9% Cadexomer  Iodine Pad 4x6 cm Discharge Instruction: Apply to wound bed as instructed Secondary Dressing ABD Pad, 8x10 Discharge Instruction: Apply over primary dressing as directed. Secured With Compression Wrap Urgo K2 Lite, (equivalent to a 3 layer) two layer compression system, regular Discharge Instruction: Apply

## 2023-09-02 ENCOUNTER — Encounter (HOSPITAL_BASED_OUTPATIENT_CLINIC_OR_DEPARTMENT_OTHER): Payer: BC Managed Care – PPO | Admitting: General Surgery

## 2023-09-02 DIAGNOSIS — L97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 with unspecified severity: Secondary | ICD-10-CM | POA: Diagnosis not present

## 2023-09-02 DIAGNOSIS — Z6841 Body Mass Index (BMI) 40.0 and over, adult: Secondary | ICD-10-CM | POA: Diagnosis not present

## 2023-09-02 DIAGNOSIS — G4733 Obstructive sleep apnea (adult) (pediatric): Secondary | ICD-10-CM | POA: Diagnosis not present

## 2023-09-02 DIAGNOSIS — E785 Hyperlipidemia, unspecified: Secondary | ICD-10-CM | POA: Diagnosis not present

## 2023-09-02 DIAGNOSIS — E11622 Type 2 diabetes mellitus with other skin ulcer: Secondary | ICD-10-CM | POA: Diagnosis not present

## 2023-09-02 DIAGNOSIS — I10 Essential (primary) hypertension: Secondary | ICD-10-CM | POA: Diagnosis not present

## 2023-09-02 DIAGNOSIS — Z9049 Acquired absence of other specified parts of digestive tract: Secondary | ICD-10-CM | POA: Diagnosis not present

## 2023-09-02 DIAGNOSIS — L97812 Non-pressure chronic ulcer of other part of right lower leg with fat layer exposed: Secondary | ICD-10-CM | POA: Diagnosis not present

## 2023-09-02 DIAGNOSIS — K760 Fatty (change of) liver, not elsewhere classified: Secondary | ICD-10-CM | POA: Diagnosis not present

## 2023-09-02 DIAGNOSIS — Z79899 Other long term (current) drug therapy: Secondary | ICD-10-CM | POA: Diagnosis not present

## 2023-09-03 NOTE — Progress Notes (Addendum)
ARIS, EVEN B ([id number]) [id number] Page 1 of 10 Visit Report for 09/02/2023 Chief Complaint Document Details Patient Name: Date of Service: [name] RD B. 09/02/2023 8:30 A M Medical Record Number: [id number] Patient Account Number: [id number] Date of Birth/Sex: Treating RN: 04/10/1976 (47 y.o. M) Primary Care Provider: Richarda Blade Other Clinician: Referring Provider: Treating Provider/Extender: Jason Nest, Dinah Weeks in Treatment: 1 Information Obtained from: Patient Chief Complaint 11/23/2018; patient is here for review of an ulcer on the left posterior calf 10/06/2019; patient is here for review of the wound area from the previous visit which is on the left posterior lateral calf 08/20/2023: here for new wound on RLE anterior Electronic Signature(s) Signed: 09/02/2023 9:06:40 AM By: Duanne Guess MD FACS Entered By: Duanne Guess on 09/02/2023 06:06:40 -------------------------------------------------------------------------------- Debridement Details Patient Name: Date of Service: [name], [name] 09/02/2023 8:30 A M Medical Record Number: [id number] Patient Account Number: [id number] Date of Birth/Sex: Treating RN: [birth year] (47 y.o. Cline Cools Primary Care Provider: Richarda Blade Other Clinician: Referring Provider: Treating Provider/Extender: Pleas Patricia in Treatment: 1 Debridement Performed for Assessment: Wound #2 Right,Anterior Lower Leg Performed By: Physician Duanne Guess, MD The following information was scribed by: Redmond Pulling The information was scribed for: Duanne Guess Debridement Type: Debridement Severity of Tissue Pre Debridement: Fat layer exposed Level of Consciousness (Pre-procedure): Awake and Alert Pre-procedure Verification/Time Out Yes - 09:00 Taken: Start Time: 09:03 Pain Control: Lidocaine 5% topical ointment Percent of Wound Bed Debrided: 20% T  Area Debrided (cm): otal 3.65 Tissue and other material debrided: Non-Viable, Eschar, Slough, Slough Level: Non-Viable Tissue Debridement Description: Selective/Open Wound Instrument: Curette Bleeding: Minimum Hemostasis Achieved: Pressure Response to Treatment: Procedure was tolerated well Level of Consciousness (Post- Awake and Alert procedure): Post Debridement Measurements of Total Wound Length: (cm) 5.4 Width: (cm) 4.3 Depth: (cm) 0.2 Volume: (cm) 3.647 Iannuzzi, Jocelyn B ([id number]) [id number] Page 2 of 10 Character of Wound/Ulcer Post Debridement: Improved Severity of Tissue Post Debridement: Fat layer exposed Post Procedure Diagnosis Same as Pre-procedure Electronic Signature(s) Signed: 09/02/2023 9:20:12 AM By: Duanne Guess MD FACS Signed: 09/02/2023 5:03:07 PM By: Redmond Pulling RN, BSN Entered By: Redmond Pulling on 09/02/2023 06:04:59 -------------------------------------------------------------------------------- HPI Details Patient Name: Date of Service: [name], [name] 09/02/2023 8:30 A M Medical Record Number: [id number] Patient Account Number: [id number] Date of Birth/Sex: Treating RN: October 06, 1976 (47 y.o. M) Primary Care Provider: Richarda Blade Other Clinician: Referring Provider: Treating Provider/Extender: Jason Nest, Dinah Weeks in Treatment: 1 History of Present Illness HPI Description: Admission 11/23/2018 This is a 47 year old man with type 2 diabetes. He tells [place] that about a week before Thanksgiving he noticed a small painful area on the back of his left mid calf. He saw a physician at urgent care on 10/10/2018 noted to have an open wound on the left calf. He was given Bactroban and Keflex as CandS was apparently negative. He saw his own primary doctor on 612/31/19 and was given Silvadene and doxycycline. He has been using Silvadene since then. He is saw his primary physician again on 1/10 was offered an Conservator, museum/gallery but I do not think that he actually use this. The area in question is on the left posterior calf. There is some question that this was a insect bite and when he first presented but he certainly did not notice his any particular bite injury at that time. This did actually apparently at one point look as though it was progressing  towards closure although it then it opened again. He has been using Silvadene for the past 3 weeks Past medical history; obstructive sleep apnea, Achilles tendinitis, hypertension, type 2 diabetes, cholecystectomy, fatty liver, ABI in this clinic was 1.38 on the left 11/30/2018; the area is essentially unchanged in size although the surface of the wound looks better. He has surrounding inflammation around the wound the exact etiology of this is unclear. 12/07/2018; the area has come down in diameter. Surface requires debridement. We have been using silver collagen. Is the wound seems to have come down in size I have elected not to biopsy this. Surrounding inflammation is also less 2/11; the area continues to come down in size and generally looks healthy. We have been using silver collagen under compression surrounding inflammation is less there is no tenderness 2/18; the area continues to contract in size and looks healthy. We have been using silver collagen under compression 2/25; no major change in dimension this week. However the wound bed continues to look healthy and there appears to be less inflammation around the wound 3/3; the wound measures slightly smaller. Healthy looking surface. Less inflammation 3/10; wound is closed. Healthy looking surface. Still some discoloration around the wound area but absolutely no tenderness. The cause of this wound was never really determined. However it progressed nicely towards healing READMISSION 10/06/2019 This is a patient I dealt with with an unusual wound on the left posterior lateral calf in the early part of this year. I  was never really completely sure I understood what this was in terms of causation. However wde use silver collagen under compression and the area responded nicely and we are able to discharge him after roughly 6 weeks in the clinic. He did not clearly have arterial or venous issues. I had some thoughts about biopsying this area however acid continued to get better I did not go through with this. He states that everything is been fine up until the last few weeks. His wife noted a scab with perhaps some purulent drainage. They called primary care and was prescribed doxy which she is finished and everything seems to have settled down there was no open area however the patient wanted [place] to look at this. READMISSION 08/20/2023 ***ABIs L: 1.04; R: 1.11*** This is a now 47 year old type II diabetic (last hemoglobin A1c 6.7%) who returns with a new wound on his right lower extremity, anterior tibial surface. He says it began exactly the way he is previous wound that led to wound care center admission started. He was mowing the lawn and developed a bump on his leg. It subsequently broke down resulting in a large ulcer. Patient was seen in the emergency department on 04 August 2023. He was given a prescription for Keflex and Bactrim. He was referred to the wound care center for further evaluation and management. 08/28/2023: The wound measured a little bit larger today and has a nonviable surface. There is still an odor coming from the wound. Edema control is good. The culture that I took last week grew out a polymicrobial population but all organisms were at fairly low levels. 09/02/2023: The wound size was stable today, but it is markedly cleaner with just a little bit of slough and eschar around the edges. The odor has abated. He has had quite a bit of drainage. Edema control is excellent. Electronic Signature(s) TYREZ, BERRIOS B ([id number]) [id number] Page 3 of 10 Signed:  09/02/2023 9:07:48 AM By: Duanne Guess MD FACS Entered By: Lady Gary,  Victorino Dike on 09/02/2023 06:07:48 -------------------------------------------------------------------------------- Physical Exam Details Patient Name: Date of Service: [name] RD B. 09/02/2023 8:30 A M Medical Record Number: [id number] Patient Account Number: [id number] Date of Birth/Sex: Treating RN: [birth year] (47 y.o. M) Primary Care Provider: Richarda Blade Other Clinician: Referring Provider: Treating Provider/Extender: Jason Nest, Dinah Weeks in Treatment: 1 Constitutional Hypertensive, asymptomatic. Slightly tachycardic. . . . Respiratory Normal work of breathing on room air.. Notes 09/02/2023: The wound size was stable today, but it is markedly cleaner with just a little bit of slough and eschar around the edges. The odor has abated. He has had quite a bit of drainage. Edema control is excellent. Electronic Signature(s) Signed: 09/02/2023 9:10:47 AM By: Duanne Guess MD FACS Entered By: Duanne Guess on 09/02/2023 06:10:47 -------------------------------------------------------------------------------- Physician Orders Details Patient Name: Date of Service: [name], [name] 09/02/2023 8:30 A M Medical Record Number: [id number] Patient Account Number: [id number] Date of Birth/Sex: Treating RN: Nov 08, 1975 (47 y.o. Cline Cools Primary Care Provider: Richarda Blade Other Clinician: Referring Provider: Treating Provider/Extender: Duanne Guess [name], Dinah Weeks in Treatment: 1 Verbal / Phone Orders: No Diagnosis Coding Follow-up Appointments ppointment in 1 week. - Dr. Lady Gary Room 2 09/07/23 12:45pm Return A ppointment in 2 weeks. - 09/15/23 @ 1000 Return A Return appointment in 3 weeks. - please schedule Return appointment in 1 month. - please schedule Anesthetic (In clinic) Topical Lidocaine 4% applied to wound bed - in clinic Bathing/ Shower/ Hygiene May shower with  protection but do not get wound dressing(s) wet. Protect dressing(s) with water repellant cover (for example, large plastic bag) or a cast cover and may then take shower. Wound Treatment Wound #2 - Lower Leg Wound Laterality: Right, Anterior Cleanser: Soap and Water 1 x Per Week/30 Days Discharge Instructions: May shower and wash wound with dial antibacterial soap and water prior to dressing change. Cleanser: Vashe 5.8 (oz) 1 x Per Week/30 Days Discharge Instructions: Cleanse the wound with Vashe prior to applying a clean dressing using gauze sponges, not tissue or cotton balls. ROLFE, HARTSELL B ([id number]) [id number] Page 4 of 10 Peri-Wound Care: Sween Lotion (Moisturizing lotion) 1 x Per Week/30 Days Discharge Instructions: Apply moisturizing lotion as directed Topical: Gentamicin 1 x Per Week/30 Days Discharge Instructions: As directed by physician Prim Dressing: Hydrofera Blue Ready Transfer Foam, 4x5 (in/in) 1 x Per Week/30 Days ary Discharge Instructions: Apply to wound bed as instructed Secondary Dressing: ABD Pad, 8x10 1 x Per Week/30 Days Discharge Instructions: Apply over primary dressing as directed. Compression Wrap: Urgo K2 Lite, (equivalent to a 3 layer) two layer compression system, regular 1 x Per Week/30 Days Discharge Instructions: Apply Urgo K2 Lite as directed (alternative to 3 layer compression). Patient Medications llergies: No Known Allergies A Notifications Medication Indication Start End 09/02/2023 lidocaine DOSE topical 5 % ointment - ointment topical once daily Electronic Signature(s) Signed: 09/02/2023 9:20:12 AM By: Duanne Guess MD FACS Signed: 09/02/2023 5:03:07 PM By: Redmond Pulling RN, BSN Previous Signature: 09/02/2023 9:11:05 AM Version By: Duanne Guess MD FACS Entered By: Redmond Pulling on 09/02/2023 06:14:21 -------------------------------------------------------------------------------- Problem List  Details Patient Name: Date of Service: [name], [name] 09/02/2023 8:30 A M Medical Record Number: [id number] Patient Account Number: [id number] Date of Birth/Sex: Treating RN: 1976/01/17 (47 y.o. M) Primary Care Provider: Richarda Blade Other Clinician: Referring Provider: Treating Provider/Extender: Jason Nest, Dinah Weeks in Treatment: 1 Active Problems ICD-10 Encounter Code Description Active Date MDM Diagnosis L97.819 Non-pressure chronic ulcer of other part  of right lower leg with unspecified 08/20/2023 No Yes severity E11.622 Type 2 diabetes mellitus with other skin ulcer 08/20/2023 No Yes E66.01 Morbid (severe) obesity due to excess calories 08/20/2023 No Yes Inactive Problems Resolved Problems Electronic Signature(s) Signed: 09/02/2023 9:06:28 AM By: Duanne Guess MD Percell Boston B ([id number]) [id number] Page 5 of 10 Entered By: Duanne Guess on 09/02/2023 06:06:27 -------------------------------------------------------------------------------- Progress Note Details Patient Name: Date of Service: [name] RD B. 09/02/2023 8:30 A M Medical Record Number: [id number] Patient Account Number: [id number] Date of Birth/Sex: Treating RN: [birth year] (47 y.o. M) Primary Care Provider: Richarda Blade Other Clinician: Referring Provider: Treating Provider/Extender: Jason Nest, Dinah Weeks in Treatment: 1 Subjective Chief Complaint Information obtained from Patient 11/23/2018; patient is here for review of an ulcer on the left posterior calf 10/06/2019; patient is here for review of the wound area from the previous visit which is on the left posterior lateral calf 08/20/2023: here for new wound on RLE anterior History of Present Illness (HPI) Admission 11/23/2018 This is a 47 year old man with type 2 diabetes. He tells [place] that about a week before Thanksgiving he noticed a small painful area on the back of his  left mid calf. He saw a physician at urgent care on 10/10/2018 noted to have an open wound on the left calf. He was given Bactroban and Keflex as CandS was apparently negative. He saw his own primary doctor on 612/31/19 and was given Silvadene and doxycycline. He has been using Silvadene since then. He is saw his primary physician again on 1/10 was offered an Radio broadcast assistant but I do not think that he actually use this. The area in question is on the left posterior calf. There is some question that this was a insect bite and when he first presented but he certainly did not notice his any particular bite injury at that time. This did actually apparently at one point look as though it was progressing towards closure although it then it opened again. He has been using Silvadene for the past 3 weeks Past medical history; obstructive sleep apnea, Achilles tendinitis, hypertension, type 2 diabetes, cholecystectomy, fatty liver, ABI in this clinic was 1.38 on the left 11/30/2018; the area is essentially unchanged in size although the surface of the wound looks better. He has surrounding inflammation around the wound the exact etiology of this is unclear. 12/07/2018; the area has come down in diameter. Surface requires debridement. We have been using silver collagen. Is the wound seems to have come down in size I have elected not to biopsy this. Surrounding inflammation is also less 2/11; the area continues to come down in size and generally looks healthy. We have been using silver collagen under compression surrounding inflammation is less there is no tenderness 2/18; the area continues to contract in size and looks healthy. We have been using silver collagen under compression 2/25; no major change in dimension this week. However the wound bed continues to look healthy and there appears to be less inflammation around the wound 3/3; the wound measures slightly smaller. Healthy looking surface. Less  inflammation 3/10; wound is closed. Healthy looking surface. Still some discoloration around the wound area but absolutely no tenderness. The cause of this wound was never really determined. However it progressed nicely towards healing READMISSION 10/06/2019 This is a patient I dealt with with an unusual wound on the left posterior lateral calf in the early part of this year. I was never really completely sure I understood  what this was in terms of causation. However wde use silver collagen under compression and the area responded nicely and we are able to discharge him after roughly 6 weeks in the clinic. He did not clearly have arterial or venous issues. I had some thoughts about biopsying this area however acid continued to get better I did not go through with this. He states that everything is been fine up until the last few weeks. His wife noted a scab with perhaps some purulent drainage. They called primary care and was prescribed doxy which she is finished and everything seems to have settled down there was no open area however the patient wanted [place] to look at this. READMISSION 08/20/2023 ***ABIs L: 1.04; R: 1.11*** This is a now 48 year old type II diabetic (last hemoglobin A1c 6.7%) who returns with a new wound on his right lower extremity, anterior tibial surface. He says it began exactly the way he is previous wound that led to wound care center admission started. He was mowing the lawn and developed a bump on his leg. It subsequently broke down resulting in a large ulcer. Patient was seen in the emergency department on 04 August 2023. He was given a prescription for Keflex and Bactrim. He was referred to the wound care center for further evaluation and management. 08/28/2023: The wound measured a little bit larger today and has a nonviable surface. There is still an odor coming from the wound. Edema control is good. The culture that I took last week grew out a polymicrobial population  but all organisms were at fairly low levels. 09/02/2023: The wound size was stable today, but it is markedly cleaner with just a little bit of slough and eschar around the edges. The odor has abated. He has had quite a bit of drainage. Edema control is excellent. Patient History Information obtained from Patient, Chart. Family History Cancer - Maternal Grandparents, Diabetes - Paternal Grandparents, Hypertension - Mother, Kidney Disease - Father, Stroke - Paternal Grandparents, No family history of Heart Disease, Hereditary Spherocytosis, Lung Disease, Seizures, Thyroid Problems, Tuberculosis. Social History Never smoker, Marital Status - Married, Alcohol Use - Moderate, Drug Use - Prior History - [employer] in college, Caffeine Use - Daily - coffee. DAO, MEMMOTT B ([id number]) [id number] Page 6 of 10 Medical History Eyes Denies history of Cataracts, Glaucoma, Optic Neuritis Ear/Nose/Mouth/Throat Denies history of Chronic sinus problems/congestion, Middle ear problems Hematologic/Lymphatic Denies history of Anemia, Hemophilia, Human Immunodeficiency Virus, Lymphedema, Sickle Cell Disease Respiratory Patient has history of Sleep Apnea - CPAP Denies history of Aspiration, Asthma, Chronic Obstructive Pulmonary Disease (COPD), Pneumothorax, Tuberculosis Cardiovascular Patient has history of Hypertension - b/p has been running high due to running out of medication Denies history of Angina, Arrhythmia, Congestive Heart Failure, Coronary Artery Disease, Deep Vein Thrombosis, Hypotension, Myocardial Infarction, Peripheral Arterial Disease, Peripheral Venous Disease, Phlebitis, Vasculitis Gastrointestinal Denies history of Cirrhosis , Colitis, Crohns, Hepatitis A, Hepatitis B, Hepatitis C Endocrine Patient has history of Type II Diabetes Denies history of Type I Diabetes Genitourinary Denies history of End Stage Renal Disease Immunological Denies history of Lupus  Erythematosus, Raynauds, Scleroderma Integumentary (Skin) Denies history of History of Burn Musculoskeletal Denies history of Gout, Rheumatoid Arthritis, Osteoarthritis, Osteomyelitis Neurologic Denies history of Dementia, Neuropathy, Quadriplegia, Paraplegia, Seizure Disorder Oncologic Denies history of Received Chemotherapy, Received Radiation Psychiatric Denies history of Anorexia/bulimia, Confinement Anxiety Hospitalization/Surgery History - cholecystectomy. - right ankle sugery for club foot. Medical A Surgical History Notes nd Constitutional Symptoms (General Health) morbid obesity, migraines Ear/Nose/Mouth/Throat  nasal congestion Cardiovascular hyperlipidemia Endocrine hypogonadism, low testosterone Genitourinary CKD Integumentary (Skin) hand dermatitis Psychiatric anxiety, depression, ADHD Objective Constitutional Hypertensive, asymptomatic. Slightly tachycardic. Vitals Time Taken: 8:40 AM, Height: 69 in, Weight: 330 lbs, BMI: 48.7, Temperature: 98.8 F, Pulse: 101 bpm, Respiratory Rate: 18 breaths/min, Blood Pressure: 155/95 mmHg. Respiratory Normal work of breathing on room air.. General Notes: 09/02/2023: The wound size was stable today, but it is markedly cleaner with just a little bit of slough and eschar around the edges. The odor has abated. He has had quite a bit of drainage. Edema control is excellent. Integumentary (Hair, Skin) Wound #2 status is Open. Original cause of wound was Bump. The date acquired was: 07/18/2023. The wound has been in treatment 1 weeks. The wound is located on the Right,Anterior Lower Leg. The wound measures 5.4cm length x 4.3cm width x 0.2cm depth; 18.237cm^2 area and 3.647cm^3 volume. There is Fat Layer (Subcutaneous Tissue) exposed. There is no tunneling or undermining noted. There is a medium amount of serosanguineous drainage noted. The wound margin is distinct with the outline attached to the wound base. There is large (67-100%)  red granulation within the wound bed. There is a small (1-33%) amount of necrotic tissue within the wound bed including Eschar and Adherent Slough. The periwound skin appearance exhibited: Scarring, Hemosiderin Staining. Periwound temperature was noted as No Abnormality. Assessment OMAIR, DETTMER B ([id number]) [id number] Page 7 of 10 Active Problems ICD-10 Non-pressure chronic ulcer of other part of right lower leg with unspecified severity Type 2 diabetes mellitus with other skin ulcer Morbid (severe) obesity due to excess calories Procedures Wound #2 Pre-procedure diagnosis of Wound #2 is a Diabetic Wound/Ulcer of the Lower Extremity located on the Right,Anterior Lower Leg .Severity of Tissue Pre Debridement is: Fat layer exposed. There was a Selective/Open Wound Non-Viable Tissue Debridement with a total area of 3.65 sq cm performed by Duanne Guess, MD. With the following instrument(s): Curette to remove Non-Viable tissue/material. Material removed includes Eschar and Slough and after achieving pain control using Lidocaine 5% topical ointment. No specimens were taken. A time out was conducted at 09:00, prior to the start of the procedure. A Minimum amount of bleeding was controlled with Pressure. The procedure was tolerated well. Post Debridement Measurements: 5.4cm length x 4.3cm width x 0.2cm depth; 3.647cm^3 volume. Character of Wound/Ulcer Post Debridement is improved. Severity of Tissue Post Debridement is: Fat layer exposed. Post procedure Diagnosis Wound #2: Same as Pre-Procedure Pre-procedure diagnosis of Wound #2 is a Diabetic Wound/Ulcer of the Lower Extremity located on the Right,Anterior Lower Leg . There was a Three Layer Compression Therapy Procedure by Redmond Pulling, RN. Post procedure Diagnosis Wound #2: Same as Pre-Procedure Plan Follow-up Appointments: Return Appointment in 1 week. - Dr. Lady Gary Room 2 09/07/23 12:45pm Return Appointment in  2 weeks. - 09/15/23 @ 1000 Return appointment in 3 weeks. - please schedule Return appointment in 1 month. - please schedule Anesthetic: (In clinic) Topical Lidocaine 4% applied to wound bed - in clinic Bathing/ Shower/ Hygiene: May shower with protection but do not get wound dressing(s) wet. Protect dressing(s) with water repellant cover (for example, large plastic bag) or a cast cover and may then take shower. The following medication(s) was prescribed: lidocaine topical 5 % ointment ointment topical once daily was prescribed at facility WOUND #2: - Lower Leg Wound Laterality: Right, Anterior Cleanser: Soap and Water 1 x Per Week/30 Days Discharge Instructions: May shower and wash wound with dial antibacterial soap and water prior to dressing  change. Cleanser: Vashe 5.8 (oz) 1 x Per Week/30 Days Discharge Instructions: Cleanse the wound with Vashe prior to applying a clean dressing using gauze sponges, not tissue or cotton balls. Peri-Wound Care: Sween Lotion (Moisturizing lotion) 1 x Per Week/30 Days Discharge Instructions: Apply moisturizing lotion as directed Topical: Gentamicin 1 x Per Week/30 Days Discharge Instructions: As directed by physician Prim Dressing: Hydrofera Blue Ready Transfer Foam, 4x5 (in/in) 1 x Per Week/30 Days ary Discharge Instructions: Apply to wound bed as instructed Secondary Dressing: ABD Pad, 8x10 1 x Per Week/30 Days Discharge Instructions: Apply over primary dressing as directed. Com pression Wrap: Urgo K2 Lite, (equivalent to a 3 layer) two layer compression system, regular 1 x Per Week/30 Days Discharge Instructions: Apply Urgo K2 Lite as directed (alternative to 3 layer compression). 09/02/2023: The wound size was stable today, but it is markedly cleaner with just a little bit of slough and eschar around the edges. The odor has abated. He has had quite a bit of drainage. Edema control is excellent. I used a curette to debride the slough and eschar from  his wound. We will continue the topical gentamicin but I am going to change his contact layer to [facility] ready. Continue Urgo light compression. Follow-up in 1 week. Electronic Signature(s) Signed: 09/04/2023 3:34:14 PM By: Shawn Stall RN, BSN Signed: 09/07/2023 7:47:56 AM By: Duanne Guess MD FACS Previous Signature: 09/02/2023 9:11:37 AM Version By: Duanne Guess MD FACS Entered By: Shawn Stall on 09/04/2023 11:07:30 [name] ([id number]) [id number] Page 8 of 10 -------------------------------------------------------------------------------- HxROS Details Patient Name: Date of Service: [name] RD B. 09/02/2023 8:30 A M Medical Record Number: [id number] Patient Account Number: [id number] Date of Birth/Sex: Treating RN: [birth year] (47 y.o. M) Primary Care Provider: Richarda Blade Other Clinician: Referring Provider: Treating Provider/Extender: Jason Nest, Dinah Weeks in Treatment: 1 Information Obtained From Patient Chart Constitutional Symptoms (General Health) Medical History: Past Medical History Notes: morbid obesity, migraines Eyes Medical History: Negative for: Cataracts; Glaucoma; Optic Neuritis Ear/Nose/Mouth/Throat Medical History: Negative for: Chronic sinus problems/congestion; Middle ear problems Past Medical History Notes: nasal congestion Hematologic/Lymphatic Medical History: Negative for: Anemia; Hemophilia; Human Immunodeficiency Virus; Lymphedema; Sickle Cell Disease Respiratory Medical History: Positive for: Sleep Apnea - CPAP Negative for: Aspiration; Asthma; Chronic Obstructive Pulmonary Disease (COPD); Pneumothorax; Tuberculosis Cardiovascular Medical History: Positive for: Hypertension - b/p has been running high due to running out of medication Negative for: Angina; Arrhythmia; Congestive Heart Failure; Coronary Artery Disease; Deep Vein Thrombosis; Hypotension; Myocardial Infarction;  Peripheral Arterial Disease; Peripheral Venous Disease; Phlebitis; Vasculitis Past Medical History Notes: hyperlipidemia Gastrointestinal Medical History: Negative for: Cirrhosis ; Colitis; Crohns; Hepatitis A; Hepatitis B; Hepatitis C Endocrine Medical History: Positive for: Type II Diabetes Negative for: Type I Diabetes Past Medical History Notes: hypogonadism, low testosterone Time with diabetes: 1 year Treated with: Oral agents Blood sugar tested every day: No Genitourinary Medical History: Negative for: End Stage Renal Disease Past Medical History Notes: CKD OSIEL, STICK B ([id number]) [id number] Page 9 of 10 Immunological Medical History: Negative for: Lupus Erythematosus; Raynauds; Scleroderma Integumentary (Skin) Medical History: Negative for: History of Burn Past Medical History Notes: hand dermatitis Musculoskeletal Medical History: Negative for: Gout; Rheumatoid Arthritis; Osteoarthritis; Osteomyelitis Neurologic Medical History: Negative for: Dementia; Neuropathy; Quadriplegia; Paraplegia; Seizure Disorder Oncologic Medical History: Negative for: Received Chemotherapy; Received Radiation Psychiatric Medical History: Negative for: Anorexia/bulimia; Confinement Anxiety Past Medical History Notes: anxiety, depression, ADHD Immunizations Pneumococcal Vaccine: Received Pneumococcal Vaccination: Yes Received Pneumococcal Vaccination On or After 60th  Birthday: No Tetanus Vaccine: Last tetanus shot: 05/25/2017 Implantable Devices None Hospitalization / Surgery History Type of Hospitalization/Surgery 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 Family and Social History Cancer: Yes - Maternal Grandparents; Diabetes: Yes - Paternal Grandparents; Heart Disease: No; Hereditary Spherocytosis: No; Hypertension: Yes - Mother; Kidney Disease: Yes - Father; Lung Disease: No; Seizures: No; Stroke: Yes - Paternal Grandparents; Thyroid  Problems: No; Tuberculosis: No; Never smoker; Marital Status - Married; Alcohol Use: Moderate; Drug Use: Prior History - [employer] in college; Caffeine Use: Daily - coffee; Financial Concerns: No; Food, Clothing or Shelter Needs: No; Support System Lacking: No; Transportation Concerns: No Electronic Signature(s) Signed: 09/02/2023 9:20:12 AM By: Duanne Guess MD FACS Entered By: Duanne Guess on 09/02/2023 06:07:53 -------------------------------------------------------------------------------- SuperBill Details Patient Name: Date of Service: [name], [name] 09/02/2023 Medical Record Number: [id number] Patient Account Number: [id number] Date of Birth/Sex: Treating RN: [birth year] (47 y.o. M) Primary Care Provider: Richarda Blade Other Clinician: Referring Provider: Treating Provider/Extender: Jason Nest, Dinah Weeks in Treatment: 1 Vanderbilt, [place] B ([id number]) [id number] Page 10 of 10 Diagnosis Coding ICD-10 Codes Code Description L97.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 with unspecified severity E11.622 Type 2 diabetes mellitus with other skin ulcer E66.01 Morbid (severe) obesity due to excess calories Facility Procedures : CPT4 Code: [id number] Description: 97597 - DEBRIDE WOUND 1ST 20 SQ CM OR < ICD-10 Diagnosis Description L97.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 with unspecified seve Modifier: rity Quantity: 1 Physician Procedures : CPT4 Code Description Modifier [id number] - WC PHYS LEVEL 4 - EST PT ICD-10 Diagnosis Description L97.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 with unspecified severity E11.622 Type 2 diabetes mellitus with other skin ulcer  E66.01 Morbid (severe) obesity due to excess calories Quantity: 1 : [id number] 97597 - WC PHYS DEBR WO ANESTH 20 SQ CM ICD-10 Diagnosis Description L97.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  with unspecified severity Quantity: 1 Electronic Signature(s) Signed: 09/02/2023 9:11:51 AM By: Duanne Guess MD FACS Entered By: Duanne Guess on 09/02/2023 06:11:50

## 2023-09-03 NOTE — Progress Notes (Signed)
ZAMEER, AWWAD B ([id number]) [id number] Page 1 of 7 Visit Report for 09/02/2023 Arrival Information Details Patient Name: Date of Service: [name] RD B. 09/02/2023 8:30 A M Medical Record Number: [id number] Patient Account Number: [id number] Date of Birth/Sex: Treating RN: 1976-09-07 (47 y.o. Cline Cools Primary Care Vernon Maish: Richarda Blade Other Clinician: Referring Archer Vise: Treating Tab Rylee/Extender: Pleas Patricia in Treatment: 1 Visit Information History Since Last Visit Added or deleted any medications: No Patient Arrived: Ambulatory Any new allergies or adverse reactions: No Arrival Time: 08:40 Had a fall or experienced change in No Accompanied By: wife activities of daily living that may affect Transfer Assistance: None risk of falls: Patient Identification Verified: Yes Signs or symptoms of abuse/neglect since last visito No Secondary Verification Process Completed: Yes Hospitalized since last visit: No Patient Requires Transmission-Based Precautions: No Implantable device outside of the clinic excluding No Patient Has Alerts: No cellular tissue based products placed in the center since last visit: Has Dressing in Place as Prescribed: Yes Has Compression in Place as Prescribed: Yes Pain Present Now: Yes Electronic Signature(s) Signed: 09/02/2023 5:03:07 PM By: Redmond Pulling RN, BSN Entered By: Redmond Pulling on 09/02/2023 08:40:48 -------------------------------------------------------------------------------- Compression Therapy Details Patient Name: Date of Service: [name], [name] 09/02/2023 8:30 A M Medical Record Number: [id number] Patient Account Number: [id number] Date of Birth/Sex: Treating RN: 1975/11/23 (47 y.o. Cline Cools Primary Care Tonique Mendonca: Richarda Blade Other Clinician: Referring Bethanne Mule: Treating Damione Robideau/Extender: Duanne Guess [name], Dinah Weeks in Treatment:  1 Compression Therapy Performed for Wound Assessment: Wound #2 Right,Anterior Lower Leg Performed By: Clinician Redmond Pulling, RN Compression Type: Three Layer Post Procedure Diagnosis Same as Pre-procedure Electronic Signature(s) Signed: 09/02/2023 5:03:07 PM By: Redmond Pulling RN, BSN Entered By: Redmond Pulling on 09/02/2023 09:05:15 [name] ([id number]) [id number] Page 2 of 7 -------------------------------------------------------------------------------- Encounter Discharge Information Details Patient Name: Date of Service: [name] RD B. 09/02/2023 8:30 A M Medical Record Number: [id number] Patient Account Number: [id number] Date of Birth/Sex: Treating RN: 03-22-1976 (47 y.o. Cline Cools Primary Care Amandamarie Feggins: Richarda Blade Other Clinician: Referring Najmo Pardue: Treating Maikel Neisler/Extender: Duanne Guess [name], Dinah Weeks in Treatment: 1 Encounter Discharge Information Items Post Procedure Vitals Discharge Condition: Stable Temperature (F): 98.8 Ambulatory Status: Ambulatory Pulse (bpm): 101 Discharge Destination: Home Respiratory Rate (breaths/min): 18 Transportation: Private Auto Blood Pressure (mmHg): 155/95 Accompanied By: wife Schedule Follow-up Appointment: Yes Clinical Summary of Care: Patient Declined Electronic Signature(s) Signed: 09/02/2023 5:03:07 PM By: Redmond Pulling RN, BSN Entered By: Redmond Pulling on 09/02/2023 09:08:20 -------------------------------------------------------------------------------- Lower Extremity Assessment Details Patient Name: Date of Service: [name], [name] 09/02/2023 8:30 A M Medical Record Number: [id number] Patient Account Number: [id number] Date of Birth/Sex: Treating RN: February 10, 1976 (47 y.o. Cline Cools Primary Care Garnett Nunziata: Richarda Blade Other Clinician: Referring Lamichael Youkhana: Treating Kniyah Khun/Extender: Duanne Guess [name], Dinah Weeks in Treatment: 1 Edema  Assessment Assessed: [Left: No] [Right: No] [Left: Edema] [Right: :] Calf Left: Right: Point of Measurement: From Medial Instep 42 cm Ankle Left: Right: Point of Measurement: From Medial Instep 26 cm Vascular Assessment Pulses: Dorsalis Pedis Palpable: [Right:Yes] Extremity colors, hair growth, and conditions: Hair Growth on Extremity: [Right:No] Temperature of Extremity: [Right:Warm] Capillary Refill: [Right:< 3 seconds] Dependent Rubor: [Right:No No] Electronic Signature(s) Signed: 09/02/2023 5:03:07 PM By: Redmond Pulling RN, BSN Entered By: Redmond Pulling on 09/02/2023 08:50:20 [name] ([id number]) [id number] Page 3 of 7 -------------------------------------------------------------------------------- Multi Wound Chart Details Patient Name: Date of Service: NEA L, [name]  Site Locations Rate the pain. Current Pain Level: 3 Pain Management and Medication Current Pain Management: Electronic Signature(s) Signed: 09/02/2023 5:03:07 PM By: Redmond Pulling RN, BSN Entered By: Redmond Pulling on 09/02/2023 08:41:26 -------------------------------------------------------------------------------- Patient/Caregiver Education Details Patient Name: Date of Service: [name] RD B. 10/30/2024andnbsp8:30 A M Medical Record Number:  [id number] Patient Account Number: [id number] Date of Birth/Gender: Treating RN: 23-Dec-1975 (47 y.o. Cline Cools Primary Care Physician: Richarda Blade Other Clinician: Referring Physician: Treating Physician/Extender: Pleas Patricia in Treatment: 1 Education Assessment Education Provided To: Patient Education Topics Provided Wound/Skin Impairment: Methods: Explain/Verbal Responses: State content correctly Electronic Signature(s) Signed: 09/02/2023 5:03:07 PM By: Redmond Pulling RN, BSN Entered By: Redmond Pulling on 09/02/2023 09:07:14 -------------------------------------------------------------------------------- Wound Assessment Details Patient Name: Date of Service: [name], [name] 09/02/2023 8:30 A M Medical Record Number: [id number] Patient Account Number: [id number] Date of Birth/Sex: Treating RN: 26-Sep-1976 (47 y.o. Cline Cools [place], [place] B ([id number]) [id number] Page 6 of 7 Primary Care Sherrian Nunnelley: Richarda Blade Other Clinician: Referring Lynnell Fiumara: Treating Jadore Mcguffin/Extender: Duanne Guess [name], Dinah Weeks in Treatment: 1 Wound Status Wound Number: 2 Primary Etiology: Diabetic Wound/Ulcer of the Lower Extremity Wound Location: Right, Anterior Lower Leg Wound Status: Open Wounding Event: Bump Comorbid History: Sleep Apnea, Hypertension, Type II Diabetes Date Acquired: 07/18/2023 Weeks Of Treatment: 1 Clustered Wound: No Photos Wound Measurements Length: (cm) 5.4 Width: (cm) 4.3 Depth: (cm) 0.2 Area: (cm) 18.237 Volume: (cm) 3.647 % Reduction in Area: -22.9% % Reduction in Volume: -145.8% Epithelialization: Medium (34-66%) Tunneling: No Undermining: No Wound Description Classification: Grade 2 Wound Margin: Distinct, outline attached Exudate Amount: Medium Exudate Type: Serosanguineous Exudate Color: red, brown Foul Odor After Cleansing: No Slough/Fibrino Yes Wound Bed Granulation  Amount: Large (67-100%) Exposed Structure Granulation Quality: Red Fat Layer (Subcutaneous Tissue) Exposed: Yes Necrotic Amount: Small (1-33%) Necrotic Quality: Eschar, Adherent Slough Periwound Skin Texture Texture Color No Abnormalities Noted: No No Abnormalities Noted: No Scarring: Yes Hemosiderin Staining: Yes Moisture Temperature / Pain No Abnormalities Noted: No Temperature: No Abnormality Treatment Notes Wound #2 (Lower Leg) Wound Laterality: Right, Anterior Cleanser Soap and Water Discharge Instruction: May shower and wash wound with dial antibacterial soap and water prior to dressing change. Vashe 5.8 (oz) Discharge Instruction: Cleanse the wound with Vashe prior to applying a clean dressing using gauze sponges, not tissue or cotton balls. Peri-Wound Care Sween Lotion (Moisturizing lotion) Discharge Instruction: Apply moisturizing lotion as directed Topical Gentamicin Discharge Instruction: As directed by physician Primary Dressing MARKEN, KINTZ B ([id number]) [id number] Page 7 of 7 Hydrofera Blue Ready Transfer Foam, 4x5 (in/in) Discharge Instruction: Apply to wound bed as instructed Secondary Dressing ABD Pad, 8x10 Discharge Instruction: Apply over primary dressing as directed. Secured With Compression Wrap Urgo K2 Lite, (equivalent to a 3 layer) two layer compression system, regular Discharge Instruction: Apply Urgo K2 Lite as directed (alternative to 3 layer compression). Compression Stockings Add-Ons Electronic Signature(s) Signed: 09/02/2023 5:03:07 PM By: Redmond Pulling RN, BSN Entered By: Redmond Pulling on 09/02/2023 08:55:56 -------------------------------------------------------------------------------- Vitals Details Patient Name: Date of Service: [name], [name] 09/02/2023 8:30 A M Medical Record Number: [id number] Patient Account Number: [id number] Date of Birth/Sex: Treating RN: 28-Dec-1975 (47 y.o. Cline Cools Primary Care Hajar Penninger: Richarda Blade Other Clinician: Referring Almalik Weissberg: Treating Janyce Ellinger/Extender: Duanne Guess [name], Dinah Weeks in Treatment: 1 Vital Signs Time Taken: 08:40 Temperature (F): 98.8 Height (in): 69 Pulse (bpm): 101 Weight (lbs): 330 Respiratory Rate (breaths/min): 18 Body Mass Index (BMI): 48.7 Blood  ZAMEER, AWWAD B ([id number]) [id number] Page 1 of 7 Visit Report for 09/02/2023 Arrival Information Details Patient Name: Date of Service: [name] RD B. 09/02/2023 8:30 A M Medical Record Number: [id number] Patient Account Number: [id number] Date of Birth/Sex: Treating RN: 1976-09-07 (47 y.o. Cline Cools Primary Care Vernon Maish: Richarda Blade Other Clinician: Referring Archer Vise: Treating Tab Rylee/Extender: Pleas Patricia in Treatment: 1 Visit Information History Since Last Visit Added or deleted any medications: No Patient Arrived: Ambulatory Any new allergies or adverse reactions: No Arrival Time: 08:40 Had a fall or experienced change in No Accompanied By: wife activities of daily living that may affect Transfer Assistance: None risk of falls: Patient Identification Verified: Yes Signs or symptoms of abuse/neglect since last visito No Secondary Verification Process Completed: Yes Hospitalized since last visit: No Patient Requires Transmission-Based Precautions: No Implantable device outside of the clinic excluding No Patient Has Alerts: No cellular tissue based products placed in the center since last visit: Has Dressing in Place as Prescribed: Yes Has Compression in Place as Prescribed: Yes Pain Present Now: Yes Electronic Signature(s) Signed: 09/02/2023 5:03:07 PM By: Redmond Pulling RN, BSN Entered By: Redmond Pulling on 09/02/2023 08:40:48 -------------------------------------------------------------------------------- Compression Therapy Details Patient Name: Date of Service: [name], [name] 09/02/2023 8:30 A M Medical Record Number: [id number] Patient Account Number: [id number] Date of Birth/Sex: Treating RN: 1975/11/23 (47 y.o. Cline Cools Primary Care Tonique Mendonca: Richarda Blade Other Clinician: Referring Bethanne Mule: Treating Damione Robideau/Extender: Duanne Guess [name], Dinah Weeks in Treatment:  1 Compression Therapy Performed for Wound Assessment: Wound #2 Right,Anterior Lower Leg Performed By: Clinician Redmond Pulling, RN Compression Type: Three Layer Post Procedure Diagnosis Same as Pre-procedure Electronic Signature(s) Signed: 09/02/2023 5:03:07 PM By: Redmond Pulling RN, BSN Entered By: Redmond Pulling on 09/02/2023 09:05:15 [name] ([id number]) [id number] Page 2 of 7 -------------------------------------------------------------------------------- Encounter Discharge Information Details Patient Name: Date of Service: [name] RD B. 09/02/2023 8:30 A M Medical Record Number: [id number] Patient Account Number: [id number] Date of Birth/Sex: Treating RN: 03-22-1976 (47 y.o. Cline Cools Primary Care Amandamarie Feggins: Richarda Blade Other Clinician: Referring Najmo Pardue: Treating Maikel Neisler/Extender: Duanne Guess [name], Dinah Weeks in Treatment: 1 Encounter Discharge Information Items Post Procedure Vitals Discharge Condition: Stable Temperature (F): 98.8 Ambulatory Status: Ambulatory Pulse (bpm): 101 Discharge Destination: Home Respiratory Rate (breaths/min): 18 Transportation: Private Auto Blood Pressure (mmHg): 155/95 Accompanied By: wife Schedule Follow-up Appointment: Yes Clinical Summary of Care: Patient Declined Electronic Signature(s) Signed: 09/02/2023 5:03:07 PM By: Redmond Pulling RN, BSN Entered By: Redmond Pulling on 09/02/2023 09:08:20 -------------------------------------------------------------------------------- Lower Extremity Assessment Details Patient Name: Date of Service: [name], [name] 09/02/2023 8:30 A M Medical Record Number: [id number] Patient Account Number: [id number] Date of Birth/Sex: Treating RN: February 10, 1976 (47 y.o. Cline Cools Primary Care Garnett Nunziata: Richarda Blade Other Clinician: Referring Lamichael Youkhana: Treating Kniyah Khun/Extender: Duanne Guess [name], Dinah Weeks in Treatment: 1 Edema  Assessment Assessed: [Left: No] [Right: No] [Left: Edema] [Right: :] Calf Left: Right: Point of Measurement: From Medial Instep 42 cm Ankle Left: Right: Point of Measurement: From Medial Instep 26 cm Vascular Assessment Pulses: Dorsalis Pedis Palpable: [Right:Yes] Extremity colors, hair growth, and conditions: Hair Growth on Extremity: [Right:No] Temperature of Extremity: [Right:Warm] Capillary Refill: [Right:< 3 seconds] Dependent Rubor: [Right:No No] Electronic Signature(s) Signed: 09/02/2023 5:03:07 PM By: Redmond Pulling RN, BSN Entered By: Redmond Pulling on 09/02/2023 08:50:20 [name] ([id number]) [id number] Page 3 of 7 -------------------------------------------------------------------------------- Multi Wound Chart Details Patient Name: Date of Service: NEA L, [name]  09/02/2023 8:30 A M Medical Record Number: [id number] Patient Account Number: [id number] Date of Birth/Sex: Treating RN: 06-15-1976 (47 y.o. M) Primary Care Ming Kunka: Richarda Blade Other Clinician: Referring Carrell Rahmani: Treating Quinlee Sciarra/Extender: Jason Nest, Dinah Weeks in Treatment: 1 Vital Signs Height(in): 69 Pulse(bpm): 101 Weight(lbs): 330 Blood Pressure(mmHg): 155/95 Body Mass Index(BMI): 48.7 Temperature(F): 98.58 Respiratory Rate(breaths/min): 18 [2:Photos:] [N/A:N/A] Right, Anterior Lower Leg N/A N/A Wound Location: Bump N/A N/A Wounding Event: Diabetic Wound/Ulcer of the Lower N/A N/A Primary Etiology: Extremity Sleep Apnea, Hypertension, Type II N/A N/A Comorbid History: Diabetes 07/18/2023 N/A N/A Date Acquired: 1 N/A N/A Weeks of Treatment: Open N/A N/A Wound Status: No N/A N/A Wound Recurrence: 5.4x4.3x0.2 N/A N/A Measurements L x W x D (cm) 18.237 N/A N/A A (cm) : rea 3.647 N/A N/A Volume (cm) : -22.90% N/A N/A % Reduction in A rea: -145.80% N/A N/A % Reduction in Volume: Grade 2 N/A N/A Classification: Medium N/A  N/A Exudate A mount: Serosanguineous N/A N/A Exudate Type: red, brown N/A N/A Exudate Color: Distinct, outline attached N/A N/A Wound Margin: Large (67-100%) N/A N/A Granulation A mount: Red N/A N/A Granulation Quality: Small (1-33%) N/A N/A Necrotic A mount: Eschar, Adherent Slough N/A N/A Necrotic Tissue: Fat Layer (Subcutaneous Tissue): Yes N/A N/A Exposed Structures: Medium (34-66%) N/A N/A Epithelialization: Debridement - Selective/Open Wound N/A N/A Debridement: Pre-procedure Verification/Time Out 09:00 N/A N/A Taken: Lidocaine 5% topical ointment N/A N/A Pain Control: Necrotic/Eschar, Slough N/A N/A Tissue Debrided: Non-Viable Tissue N/A N/A Level: 3.65 N/A N/A Debridement A (sq cm): rea Curette N/A N/A Instrument: Minimum N/A N/A Bleeding: Pressure N/A N/A Hemostasis A chieved: Procedure was tolerated well N/A N/A Debridement Treatment Response: 5.4x4.3x0.2 N/A N/A Post Debridement Measurements L x W x D (cm) 3.647 N/A N/A Post Debridement Volume: (cm) Scarring: Yes N/A N/A Periwound Skin Texture: Hemosiderin Staining: Yes N/A N/A Periwound Skin Color: No Abnormality N/A N/A Temperature: Compression Therapy N/A N/A Procedures Performed: Debridement MITCHELL, CHURCHWELL B ([id number]) [id number] Page 4 of 7 Treatment Notes Electronic Signature(s) Signed: 09/02/2023 9:06:34 AM By: Duanne Guess MD FACS Entered By: Duanne Guess on 09/02/2023 09:06:34 -------------------------------------------------------------------------------- Multi-Disciplinary Care Plan Details Patient Name: Date of Service: [name], [name] 09/02/2023 8:30 A M Medical Record Number: [id number] Patient Account Number: [id number] Date of Birth/Sex: Treating RN: 08-17-1976 (47 y.o. Cline Cools Primary Care Brett Soza: Richarda Blade Other Clinician: Referring Liala Codispoti: Treating Aeron Donaghey/Extender: Duanne Guess [name], Dinah Weeks in  Treatment: 1 Active Inactive Wound/Skin Impairment Nursing Diagnoses: Knowledge deficit related to ulceration/compromised skin integrity Goals: Patient/caregiver will verbalize understanding of skin care regimen Date Initiated: 08/20/2023 Target Resolution Date: 10/03/2023 Goal Status: Active Interventions: Assess patient/caregiver ability to obtain necessary supplies Assess patient/caregiver ability to perform ulcer/skin care regimen upon admission and as needed Assess ulceration(s) every visit Provide education on ulcer and skin care Screen for HBO Treatment Activities: Skin care regimen initiated : 08/20/2023 Topical wound management initiated : 08/20/2023 Notes: Electronic Signature(s) Signed: 09/02/2023 5:03:07 PM By: Redmond Pulling RN, BSN Entered By: Redmond Pulling on 09/02/2023 08:59:56 -------------------------------------------------------------------------------- Pain Assessment Details Patient Name: Date of Service: [name], [name] 09/02/2023 8:30 A M Medical Record Number: [id number] Patient Account Number: [id number] Date of Birth/Sex: Treating RN: 06-Sep-1976 (47 y.o. Cline Cools Primary Care Jordann Grime: Richarda Blade Other Clinician: Referring Chayanne Filippi: Treating Daxen Lanum/Extender: Jason Nest, Dinah Weeks in Treatment: 1 Active Problems Location of Pain Severity and Description of Pain ZACHORY, HATZ B ([id number]) [id number] Page 5 of 7 Patient Has Paino Yes

## 2023-09-07 ENCOUNTER — Ambulatory Visit (HOSPITAL_BASED_OUTPATIENT_CLINIC_OR_DEPARTMENT_OTHER): Payer: BC Managed Care – PPO | Admitting: General Surgery

## 2023-09-08 ENCOUNTER — Encounter (HOSPITAL_BASED_OUTPATIENT_CLINIC_OR_DEPARTMENT_OTHER): Payer: BC Managed Care – PPO | Attending: General Surgery | Admitting: General Surgery

## 2023-09-08 DIAGNOSIS — L97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 with unspecified severity: Secondary | ICD-10-CM | POA: Insufficient documentation

## 2023-09-08 DIAGNOSIS — E11622 Type 2 diabetes mellitus with other skin ulcer: Secondary | ICD-10-CM | POA: Diagnosis not present

## 2023-09-08 DIAGNOSIS — G4733 Obstructive sleep apnea (adult) (pediatric): Secondary | ICD-10-CM | POA: Diagnosis not present

## 2023-09-08 DIAGNOSIS — I10 Essential (primary) hypertension: Secondary | ICD-10-CM | POA: Diagnosis not present

## 2023-09-08 DIAGNOSIS — E1151 Type 2 diabetes mellitus with diabetic peripheral angiopathy without gangrene: Secondary | ICD-10-CM | POA: Insufficient documentation

## 2023-09-08 NOTE — Progress Notes (Signed)
MAVIN, DYKE ([id number]) [id number] Page 1 of 1 Visit Report for 09/08/2023 SuperBill Details Patient Name: Date of Service: [name] RD B. 09/08/2023 Medical Record Number: [id number] Patient Account Number: [id number] Date of Birth/Sex: Treating RN: [birth year] (47 y.o. [name] Primary Care Provider: Richarda Blade Other Clinician: Referring Provider: Treating Provider/Extender: Jason Nest, Dinah Weeks in Treatment: 2 Diagnosis Coding ICD-10 Codes Code Description [phone] Non-pressure chronic ulcer of other part of right lower leg with unspecified severity E11.622 Type 2 diabetes mellitus with other skin ulcer E66.01 Morbid (severe) obesity due to excess calories Facility Procedures CPT4 Code Description Modifier Quantity [id number] (Facility Use Only) [phone] - APPLY MULTLAY COMPRS LWR RT LEG 1 ICD-10 Diagnosis Description L97.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 with unspecified severity Electronic Signature(s) Signed: 09/08/2023 3:30:49 PM By: Samuella Bruin Signed: 09/08/2023 3:48:24 PM By: Duanne Guess MD FACS Entered By: Samuella Bruin on 09/08/2023 12:29:05

## 2023-09-08 NOTE — Progress Notes (Signed)
DAMARI, SUASTEGUI B ([id number]) [id number] Page 1 of 3 Visit Report for 09/08/2023 Arrival Information Details Patient Name: Date of Service: [name] RD B. 09/08/2023 3:15 PM Medical Record Number: [id number] Patient Account Number: [id number] Date of Birth/Sex: Treating RN: 1976-09-03 (47 y.o. [name] Primary Care Randell Teare: Richarda Blade Other Clinician: Referring Daziah Hesler: Treating Desira Alessandrini/Extender: Pleas Patricia in Treatment: 2 Visit Information History Since Last Visit Added or deleted any medications: No Patient Arrived: Ambulatory Any new allergies or adverse reactions: No Arrival Time: 15:27 Had a fall or experienced change in No Accompanied By: self activities of daily living that may affect Transfer Assistance: None risk of falls: Patient Identification Verified: Yes Signs or symptoms of abuse/neglect since last visito No Secondary Verification Process Completed: Yes Hospitalized since last visit: No Patient Requires Transmission-Based Precautions: No Implantable device outside of the clinic excluding No Patient Has Alerts: No cellular tissue based products placed in the center since last visit: Has Dressing in Place as Prescribed: Yes Has Compression in Place as Prescribed: Yes Pain Present Now: No Electronic Signature(s) Signed: 09/08/2023 3:30:49 PM By: Samuella Bruin Entered By: Samuella Bruin on 09/08/2023 12:27:55 -------------------------------------------------------------------------------- Compression Therapy Details Patient Name: Date of Service: [name], [name] 09/08/2023 3:15 PM Medical Record Number: [id number] Patient Account Number: [id number] Date of Birth/Sex: Treating RN: 04/08/1976 (47 y.o. [name] Primary Care Deardra Hinkley: Richarda Blade Other Clinician: Referring Guerino Caporale: Treating Nathifa Ritthaler/Extender: Jason Nest, Dinah Weeks in Treatment:  2 Compression Therapy Performed for Wound Assessment: Wound #2 Right,Anterior Lower Leg Performed By: Clinician Samuella Bruin, RN Compression Type: Double Layer Electronic Signature(s) Signed: 09/08/2023 3:30:49 PM By: Gelene Mink By: Samuella Bruin on 09/08/2023 12:28:25 -------------------------------------------------------------------------------- Encounter Discharge Information Details Patient Name: Date of Service: [name], [name] 09/08/2023 3:15 PM Medical Record Number: [id number] Patient Account Number: [id number] [name], [name] ([id number]) [phone].pdf Page 2 of 3 Date of Birth/Sex: Treating RN: 1975/12/07 (47 y.o. [name] Primary Care Ayce Pietrzyk: Other Clinician: Richarda Blade Referring Ozella Comins: Treating Jaleena Viviani/Extender: Pleas Patricia in Treatment: 2 Encounter Discharge Information Items Discharge Condition: Stable Ambulatory Status: Ambulatory Discharge Destination: Home Transportation: Private Auto Accompanied By: self Schedule Follow-up Appointment: Yes Clinical Summary of Care: Patient Declined Electronic Signature(s) Signed: 09/08/2023 3:30:49 PM By: Gelene Mink By: Samuella Bruin on 09/08/2023 12:28:54 -------------------------------------------------------------------------------- Patient/Caregiver Education Details Patient Name: Date of Service: [name] RD B. 11/5/2024andnbsp3:15 PM Medical Record Number: [id number] Patient Account Number: [id number] Date of Birth/Gender: Treating RN: 11-Feb-1976 (47 y.o. [name] Primary Care Physician: Richarda Blade Other Clinician: Referring Physician: Treating Physician/Extender: Pleas Patricia in Treatment: 2 Education Assessment Education Provided To: Patient Education Topics Provided Wound/Skin Impairment: Methods: Explain/Verbal Responses: Reinforcements needed, State  content correctly Electronic Signature(s) Signed: 09/08/2023 3:30:49 PM By: Samuella Bruin Entered By: Samuella Bruin on 09/08/2023 12:28:41 -------------------------------------------------------------------------------- Wound Assessment Details Patient Name: Date of Service: [name], [name] 09/08/2023 3:15 PM Medical Record Number: [id number] Patient Account Number: [id number] Date of Birth/Sex: Treating RN: Aug 24, 1976 (47 y.o. [name] Primary Care Baron Parmelee: Richarda Blade Other Clinician: Referring Avya Flavell: Treating Spencer Cardinal/Extender: Duanne Guess [name], Dinah Weeks in Treatment: 2 Wound Status Wound Number: 2 Primary Etiology: Diabetic Wound/Ulcer of the Lower Extremity Wound Location: Right, Anterior Lower Leg Wound Status: Open Wounding Event: Bump Comorbid History: Sleep Apnea, Hypertension, Type II Diabetes Date Acquired: 07/18/2023 TRAY, KLAYMAN ([id number]) [phone].pdf Page 3 of 3 Weeks Of Treatment: 2 Clustered Wound:  No Wound Measurements Length: (cm) 5.4 Width: (cm) 4.3 Depth: (cm) 0.2 Area: (cm) 18.237 Volume: (cm) 3.647 % Reduction in Area: -22.9% % Reduction in Volume: -145.8% Epithelialization: Medium (34-66%) Wound Description Classification: Grade 2 Wound Margin: Distinct, outline attached Exudate Amount: Medium Exudate Type: Serosanguineous Exudate Color: red, brown Foul Odor After Cleansing: No Slough/Fibrino Yes Wound Bed Granulation Amount: Large (67-100%) Exposed Structure Granulation Quality: Red Fat Layer (Subcutaneous Tissue) Exposed: Yes Necrotic Amount: Small (1-33%) Necrotic Quality: Eschar, Adherent Slough Periwound Skin Texture Texture Color No Abnormalities Noted: No No Abnormalities Noted: No Scarring: Yes Hemosiderin Staining: Yes Moisture Temperature / Pain No Abnormalities Noted: No Temperature: No Abnormality Treatment Notes Wound #2 (Lower Leg) Wound Laterality: Right,  Anterior Cleanser Soap and Water Discharge Instruction: May shower and wash wound with dial antibacterial soap and water prior to dressing change. Vashe 5.8 (oz) Discharge Instruction: Cleanse the wound with Vashe prior to applying a clean dressing using gauze sponges, not tissue or cotton balls. Peri-Wound Care Sween Lotion (Moisturizing lotion) Discharge Instruction: Apply moisturizing lotion as directed Topical Gentamicin Discharge Instruction: As directed by physician Primary Dressing Hydrofera Blue Ready Transfer Foam, 4x5 (in/in) Discharge Instruction: Apply to wound bed as instructed Secondary Dressing ABD Pad, 8x10 Discharge Instruction: Apply over primary dressing as directed. Secured With Compression Wrap Urgo K2 Lite, (equivalent to a 3 layer) two layer compression system, regular Discharge Instruction: Apply Urgo K2 Lite as directed (alternative to 3 layer compression). Compression Stockings Add-Ons Electronic Signature(s) Signed: 09/08/2023 3:30:49 PM By: Samuella Bruin Entered By: Samuella Bruin on 09/08/2023 12:28:13

## 2023-09-15 ENCOUNTER — Encounter (HOSPITAL_BASED_OUTPATIENT_CLINIC_OR_DEPARTMENT_OTHER): Payer: BC Managed Care – PPO | Admitting: General Surgery

## 2023-09-15 DIAGNOSIS — E11622 Type 2 diabetes mellitus with other skin ulcer: Secondary | ICD-10-CM | POA: Diagnosis not present

## 2023-09-15 DIAGNOSIS — L97812 Non-pressure chronic ulcer of other part of right lower leg with fat layer exposed: Secondary | ICD-10-CM | POA: Diagnosis not present

## 2023-09-15 NOTE — Progress Notes (Addendum)
YASH, LASSMAN ([id number]) [id number] Page 1 of 9 Visit Report for 09/15/2023 Chief Complaint Document Details Patient Name: Date of Service: [name], [place] RD [name] 09/15/2023 10:00 A M Medical Record Number: [id number] Patient Account Number: [id number] Date of Birth/Sex: Treating RN: 01/12/1976 (47 y.o. M) Primary Care Provider: Richarda Blade Other Clinician: Referring Provider: Treating Provider/Extender: Jason Nest, Dinah Weeks in Treatment: 3 Information Obtained from: Patient Chief Complaint 11/23/2018; patient is here for review of an ulcer on the left posterior calf 10/06/2019; patient is here for review of the wound area from the previous visit which is on the left posterior lateral calf 08/20/2023: here for new wound on RLE anterior Electronic Signature(s) Signed: 09/15/2023 10:28:45 AM By: Duanne Guess MD FACS Entered By: Duanne Guess on 09/15/2023 07:28:45 -------------------------------------------------------------------------------- HPI Details Patient Name: Date of Service: [name], [name] 09/15/2023 10:00 A M Medical Record Number: [id number] Patient Account Number: [id number] Date of Birth/Sex: Treating RN: February 16, 1976 (47 y.o. M) Primary Care Provider: Richarda Blade Other Clinician: Referring Provider: Treating Provider/Extender: Jason Nest, Dinah Weeks in Treatment: 3 History of Present Illness HPI Description: Admission 11/23/2018 This is a 47 year old man with type 2 diabetes. He tells [place] that about a week before Thanksgiving he noticed a small painful area on the back of his left mid calf. He saw a physician at urgent care on 10/10/2018 noted to have an open wound on the left calf. He was given Bactroban and Keflex as CandS was apparently negative. He saw his own primary doctor on 612/31/19 and was given Silvadene and doxycycline. He has been using Silvadene since then. He is saw his primary physician  again on 1/10 was offered an Radio broadcast assistant but I do not think that he actually use this. The area in question is on the left posterior calf. There is some question that this was a insect bite and when he first presented but he certainly did not notice his any particular bite injury at that time. This did actually apparently at one point look as though it was progressing towards closure although it then it opened again. He has been using Silvadene for the past 3 weeks Past medical history; obstructive sleep apnea, Achilles tendinitis, hypertension, type 2 diabetes, cholecystectomy, fatty liver, ABI in this clinic was 1.38 on the left 11/30/2018; the area is essentially unchanged in size although the surface of the wound looks better. He has surrounding inflammation around the wound the exact etiology of this is unclear. 12/07/2018; the area has come down in diameter. Surface requires debridement. We have been using silver collagen. Is the wound seems to have come down in size I have elected not to biopsy this. Surrounding inflammation is also less 2/11; the area continues to come down in size and generally looks healthy. We have been using silver collagen under compression surrounding inflammation is less there is no tenderness 2/18; the area continues to contract in size and looks healthy. We have been using silver collagen under compression 2/25; no major change in dimension this week. However the wound bed continues to look healthy and there appears to be less inflammation around the wound 3/3; the wound measures slightly smaller. Healthy looking surface. Less inflammation 3/10; wound is closed. Healthy looking surface. Still some discoloration around the wound area but absolutely no tenderness. The cause of this wound was never really determined. However it progressed nicely towards healing READMISSION 10/06/2019 [name], [name] ([id number]) [phone].pdf Page 2 of 9 This is a  patient I dealt with with an unusual wound on the left posterior lateral calf in the early part of this year. I was never really completely sure I understood what this was in terms of causation. However wde use silver collagen under compression and the area responded nicely and we are able to discharge him after roughly 6 weeks in the clinic. He did not clearly have arterial or venous issues. I had some thoughts about biopsying this area however acid continued to get better I did not go through with this. He states that everything is been fine up until the last few weeks. His wife noted a scab with perhaps some purulent drainage. They called primary care and was prescribed doxy which she is finished and everything seems to have settled down there was no open area however the patient wanted [place] to look at this. READMISSION 08/20/2023 ***ABIs [name]: 1.04; R: 1.11*** This is a now 47 year old type II diabetic (last hemoglobin A1c 6.7%) who returns with a new wound on his right lower extremity, anterior tibial surface. He says it began exactly the way he is previous wound that led to wound care center admission started. He was mowing the lawn and developed a bump on his leg. It subsequently broke down resulting in a large ulcer. Patient was seen in the emergency department on 04 August 2023. He was given a prescription for Keflex and Bactrim. He was referred to the wound care center for further evaluation and management. 08/28/2023: The wound measured a little bit larger today and has a nonviable surface. There is still an odor coming from the wound. Edema control is good. The culture that I took last week grew out a polymicrobial population but all organisms were at fairly low levels. 09/02/2023: The wound size was stable today, but it is markedly cleaner with just a little bit of slough and eschar around the edges. The odor has abated. He has had quite a bit of drainage. Edema control is  excellent. 09/15/2023: The wound was a little bit smaller today and the surface is incredibly clean without any slough or eschar buildup. No odor or significant drainage. Edema control is excellent. Electronic Signature(s) Signed: 09/15/2023 10:29:26 AM By: Duanne Guess MD FACS Entered By: Duanne Guess on 09/15/2023 07:29:26 -------------------------------------------------------------------------------- Physical Exam Details Patient Name: Date of Service: [name], [name] 09/15/2023 10:00 A M Medical Record Number: [id number] Patient Account Number: [id number] Date of Birth/Sex: Treating RN: 1976-08-28 (47 y.o. M) Primary Care Provider: Richarda Blade Other Clinician: Referring Provider: Treating Provider/Extender: Jason Nest, Dinah Weeks in Treatment: 3 Constitutional Hypertensive, asymptomatic. . . . no acute distress. Respiratory Normal work of breathing on room air.. Notes 09/15/2023: The wound was a little bit smaller today and the surface is incredibly clean without any slough or eschar buildup. No odor or significant drainage. Edema control is excellent. Electronic Signature(s) Signed: 09/15/2023 10:30:01 AM By: Duanne Guess MD FACS Entered By: Duanne Guess on 09/15/2023 07:30:01 -------------------------------------------------------------------------------- Physician Orders Details Patient Name: Date of Service: [name], [name] 09/15/2023 10:00 A M Medical Record Number: [id number] Patient Account Number: [id number] Date of Birth/Sex: Treating RN: Feb 23, 1976 (47 y.o. Marlan Palau Primary Care Provider: Richarda Blade Other Clinician: Referring Provider: Treating Provider/Extender: Pleas Patricia in Treatment: 3 The following information was scribed by: Samuella Bruin The information was scribed for: Aemon, Faller ([id number]) [id number] Page 3 of 9 Verbal /  Phone Orders: No Diagnosis Coding ICD-10 Coding  Code Description L97.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 with unspecified severity E11.622 Type 2 diabetes mellitus with other skin ulcer E66.01 Morbid (severe) obesity due to excess calories Follow-up Appointments ppointment in 1 week. - Dr. Lady Gary Room 2 Return A Anesthetic (In clinic) Topical Lidocaine 4% applied to wound bed - in clinic Bathing/ Shower/ Hygiene May shower with protection but do not get wound dressing(s) wet. Protect dressing(s) with water repellant cover (for example, large plastic bag) or a cast cover and may then take shower. Edema Control - Orders / Instructions Elevate legs to the level of the heart or above for 30 minutes daily and/or when sitting for 3-4 times a day throughout the day. A void standing for long periods of time. If compression wraps slide down please call wound center and speak with a nurse. Wound Treatment Wound #2 - Lower Leg Wound Laterality: Right, Anterior Cleanser: Soap and Water 1 x Per Week/30 Days Discharge Instructions: May shower and wash wound with dial antibacterial soap and water prior to dressing change. Cleanser: Vashe 5.8 (oz) 1 x Per Week/30 Days Discharge Instructions: Cleanse the wound with Vashe prior to applying a clean dressing using gauze sponges, not tissue or cotton balls. Peri-Wound Care: Sween Lotion (Moisturizing lotion) 1 x Per Week/30 Days Discharge Instructions: Apply moisturizing lotion as directed Topical: Gentamicin 1 x Per Week/30 Days Discharge Instructions: As directed by physician Prim Dressing: Hydrofera Blue Ready Transfer Foam, 4x5 (in/in) 1 x Per Week/30 Days ary Discharge Instructions: Apply to wound bed as instructed Secondary Dressing: ABD Pad, 8x10 1 x Per Week/30 Days Discharge Instructions: Apply over primary dressing as directed. Compression Wrap: Urgo K2 Lite, (equivalent to a 3 layer) two layer compression system,  regular 1 x Per Week/30 Days Discharge Instructions: Apply Urgo K2 Lite as directed (alternative to 3 layer compression). Patient Medications llergies: No Known Allergies A Notifications Medication Indication Start End 09/15/2023 lidocaine DOSE topical 4 % cream - cream topical Electronic Signature(s) Signed: 09/15/2023 2:54:12 PM By: Duanne Guess MD FACS Signed: 09/15/2023 4:47:31 PM By: Karie Schwalbe RN Previous Signature: 09/15/2023 12:22:49 PM Version By: Duanne Guess MD FACS Previous Signature: 09/15/2023 10:34:59 AM Version By: Duanne Guess MD FACS Entered By: Karie Schwalbe on 09/15/2023 09:36:29 Problem List Details -------------------------------------------------------------------------------- [name] ([id number]) [id number] Page 4 of 9 Patient Name: Date of Service: [name], [place] RD [name] 09/15/2023 10:00 A M Medical Record Number: [id number] Patient Account Number: [id number] Date of Birth/Sex: Treating RN: August 26, 1976 (47 y.o. M) Primary Care Provider: Richarda Blade Other Clinician: Referring Provider: Treating Provider/Extender: Jason Nest, Dinah Weeks in Treatment: 3 Active Problems ICD-10 Encounter Code Description Active Date MDM Diagnosis L97.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 with unspecified 08/20/2023 No Yes severity E11.622 Type 2 diabetes mellitus with other skin ulcer 08/20/2023 No Yes E66.01 Morbid (severe) obesity due to excess calories 08/20/2023 No Yes Inactive Problems Resolved Problems Electronic Signature(s) Signed: 09/15/2023 10:28:07 AM By: Duanne Guess MD FACS Entered By: Duanne Guess on 09/15/2023 07:28:07 -------------------------------------------------------------------------------- Progress Note Details Patient Name: Date of Service: [name], [name] 09/15/2023 10:00 A M Medical Record Number: [id number] Patient Account Number:  [id number] Date of Birth/Sex: Treating RN: [birth year] (47 y.o. M) Primary Care Provider: Richarda Blade Other Clinician: Referring Provider: Treating Provider/Extender: Jason Nest, Dinah Weeks in Treatment: 3 Subjective Chief Complaint Information obtained from Patient 11/23/2018; patient is here for review of an ulcer on the left posterior calf  10/06/2019; patient is here for review of the wound area from the previous visit which is on the left posterior lateral calf 08/20/2023: here for new wound on RLE anterior History of Present Illness (HPI) Admission 11/23/2018 This is a 46 year old man with type 2 diabetes. He tells [place] that about a week before Thanksgiving he noticed a small painful area on the back of his left mid calf. He saw a physician at urgent care on 10/10/2018 noted to have an open wound on the left calf. He was given Bactroban and Keflex as CandS was apparently negative. He saw his own primary doctor on 612/31/19 and was given Silvadene and doxycycline. He has been using Silvadene since then. He is saw his primary physician again on 1/10 was offered an Radio broadcast assistant but I do not think that he actually use this. The area in question is on the left posterior calf. There is some question that this was a insect bite and when he first presented but he certainly did not notice his any particular bite injury at that time. This did actually apparently at one point look as though it was progressing towards closure although it then it opened again. He has been using Silvadene for the past 3 weeks Past medical history; obstructive sleep apnea, Achilles tendinitis, hypertension, type 2 diabetes, cholecystectomy, fatty liver, ABI in this clinic was 1.38 on the left 11/30/2018; the area is essentially unchanged in size although the surface of the wound looks better. He has surrounding inflammation around the wound the exact etiology of this is unclear. 12/07/2018; the area has come down  in diameter. Surface requires debridement. We have been using silver collagen. Is the wound seems to have come down in size I have elected not to biopsy this. Surrounding inflammation is also less [name], [name] ([id number]) [id number] Page 5 of 9 2/11; the area continues to come down in size and generally looks healthy. We have been using silver collagen under compression surrounding inflammation is less there is no tenderness 2/18; the area continues to contract in size and looks healthy. We have been using silver collagen under compression 2/25; no major change in dimension this week. However the wound bed continues to look healthy and there appears to be less inflammation around the wound 3/3; the wound measures slightly smaller. Healthy looking surface. Less inflammation 3/10; wound is closed. Healthy looking surface. Still some discoloration around the wound area but absolutely no tenderness. The cause of this wound was never really determined. However it progressed nicely towards healing READMISSION 10/06/2019 This is a patient I dealt with with an unusual wound on the left posterior lateral calf in the early part of this year. I was never really completely sure I understood what this was in terms of causation. However wde use silver collagen under compression and the area responded nicely and we are able to discharge him after roughly 6 weeks in the clinic. He did not clearly have arterial or venous issues. I had some thoughts about biopsying this area however acid continued to get better I did not go through with this. He states that everything is been fine up until the last few weeks. His wife noted a scab with perhaps some purulent drainage. They called primary care and was prescribed doxy which she is finished and everything seems to have settled down there was no open area however the patient wanted [place] to look at this. READMISSION 08/20/2023 ***ABIs [name]:  1.04; R: 1.11*** This is a now  47 year old type II diabetic (last hemoglobin A1c 6.7%) who returns with a new wound on his right lower extremity, anterior tibial surface. He says it began exactly the way he is previous wound that led to wound care center admission started. He was mowing the lawn and developed a bump on his leg. It subsequently broke down resulting in a large ulcer. Patient was seen in the emergency department on 04 August 2023. He was given a prescription for Keflex and Bactrim. He was referred to the wound care center for further evaluation and management. 08/28/2023: The wound measured a little bit larger today and has a nonviable surface. There is still an odor coming from the wound. Edema control is good. The culture that I took last week grew out a polymicrobial population but all organisms were at fairly low levels. 09/02/2023: The wound size was stable today, but it is markedly cleaner with just a little bit of slough and eschar around the edges. The odor has abated. He has had quite a bit of drainage. Edema control is excellent. 09/15/2023: The wound was a little bit smaller today and the surface is incredibly clean without any slough or eschar buildup. No odor or significant drainage. Edema control is excellent. Patient History Information obtained from Patient, Chart. Family History Cancer - Maternal Grandparents, Diabetes - Paternal Grandparents, Hypertension - Mother, Kidney Disease - Father, Stroke - Paternal Grandparents, No family history of Heart Disease, Hereditary Spherocytosis, Lung Disease, Seizures, Thyroid Problems, Tuberculosis. Social History Never smoker, Marital Status - Married, Alcohol Use - Moderate, Drug Use - Prior History - [employer] in college, Caffeine Use - Daily - coffee. Medical History Eyes Denies history of Cataracts, Glaucoma, Optic Neuritis Ear/Nose/Mouth/Throat Denies history of Chronic sinus problems/congestion, Middle ear  problems Hematologic/Lymphatic Denies history of Anemia, Hemophilia, Human Immunodeficiency Virus, Lymphedema, Sickle Cell Disease Respiratory Patient has history of Sleep Apnea - CPAP Denies history of Aspiration, Asthma, Chronic Obstructive Pulmonary Disease (COPD), Pneumothorax, Tuberculosis Cardiovascular Patient has history of Hypertension - [name]/p has been running high due to running out of medication Denies history of Angina, Arrhythmia, Congestive Heart Failure, Coronary Artery Disease, Deep Vein Thrombosis, Hypotension, Myocardial Infarction, Peripheral Arterial Disease, Peripheral Venous Disease, Phlebitis, Vasculitis Gastrointestinal Denies history of Cirrhosis , Colitis, Crohns, Hepatitis A, Hepatitis [name], Hepatitis C Endocrine Patient has history of Type II Diabetes Denies history of Type I Diabetes Genitourinary Denies history of End Stage Renal Disease Immunological Denies history of Lupus Erythematosus, Raynauds, Scleroderma Integumentary (Skin) Denies history of History of Burn Musculoskeletal Denies history of Gout, Rheumatoid Arthritis, Osteoarthritis, Osteomyelitis Neurologic Denies history of Dementia, Neuropathy, Quadriplegia, Paraplegia, Seizure Disorder Oncologic Denies history of Received Chemotherapy, Received Radiation Psychiatric Denies history of Anorexia/bulimia, Confinement Anxiety Hospitalization/Surgery History - cholecystectomy. - right ankle sugery for club foot. Medical A Surgical History Notes nd Constitutional Symptoms (General Health) morbid obesity, migraines Ear/Nose/Mouth/Throat nasal congestion Cardiovascular hyperlipidemia Endocrine hypogonadism, low testosterone [name], [name] ([id number]) [id number] Page 6 of 9 CKD Integumentary (Skin) hand dermatitis Psychiatric anxiety, depression, ADHD Objective Constitutional Hypertensive, asymptomatic. no acute distress. Vitals Time Taken: 10:04  AM, Height: 69 in, Weight: 330 lbs, BMI: 48.7, Temperature: 99 F, Pulse: 97 bpm, Respiratory Rate: 18 breaths/min, Blood Pressure: 153/96 mmHg. Respiratory Normal work of breathing on room air.. General Notes: 09/15/2023: The wound was a little bit smaller today and the surface is incredibly clean without any slough or eschar buildup. No odor or significant drainage. Edema control is excellent. Integumentary (Hair, Skin) Wound #2 status is Open.  Original cause of wound was Bump. The date acquired was: 07/18/2023. The wound has been in treatment 3 weeks. The wound is located on the Right,Anterior Lower Leg. The wound measures 4cm length x 3.4cm width x 0.1cm depth; 10.681cm^2 area and 1.068cm^3 volume. There is Fat Layer (Subcutaneous Tissue) exposed. There is no tunneling or undermining noted. There is a medium amount of serosanguineous drainage noted. The wound margin is distinct with the outline attached to the wound base. There is large (67-100%) red granulation within the wound bed. There is no necrotic tissue within the wound bed. The periwound skin appearance had no abnormalities noted for texture. The periwound skin appearance had no abnormalities noted for moisture. The periwound skin appearance exhibited: Hemosiderin Staining. Periwound temperature was noted as No Abnormality. Assessment Active Problems ICD-10 Non-pressure chronic ulcer of other part of right lower leg with unspecified severity Type 2 diabetes mellitus with other skin ulcer Morbid (severe) obesity due to excess calories Procedures Wound #2 Pre-procedure diagnosis of Wound #2 is a Diabetic Wound/Ulcer of the Lower Extremity located on the Right,Anterior Lower Leg . There was a Double Layer Compression Therapy Procedure by Burt Ek, RN. Post procedure Diagnosis Wound #2: Same as Pre-Procedure Plan Follow-up Appointments: Return Appointment in 1 week. - Dr. Lady Gary Room 2 Anesthetic: (In clinic) Topical  Lidocaine 4% applied to wound bed - in clinic Bathing/ Shower/ Hygiene: May shower with protection but do not get wound dressing(s) wet. Protect dressing(s) with water repellant cover (for example, large plastic bag) or a cast cover and may then take shower. Edema Control - Orders / Instructions: Elevate legs to the level of the heart or above for 30 minutes daily and/or when sitting for 3-4 times a day throughout the day. Avoid standing for long periods of time. If compression wraps slide down please call wound center and speak with a nurse. The following medication(s) was prescribed: lidocaine topical 4 % cream cream topical was prescribed at facility WOUND #2: - Lower Leg Wound Laterality: Right, Anterior Cleanser: Soap and Water 1 x Per Week/30 Days Discharge Instructions: May shower and wash wound with dial antibacterial soap and water prior to dressing change. [name], [name] ([id number]) [id number] Page 7 of 9 Cleanser: Vashe 5.8 (oz) 1 x Per Week/30 Days Discharge Instructions: Cleanse the wound with Vashe prior to applying a clean dressing using gauze sponges, not tissue or cotton balls. Peri-Wound Care: Sween Lotion (Moisturizing lotion) 1 x Per Week/30 Days Discharge Instructions: Apply moisturizing lotion as directed Topical: Gentamicin 1 x Per Week/30 Days Discharge Instructions: As directed by physician Prim Dressing: Hydrofera Blue Ready Transfer Foam, 4x5 (in/in) 1 x Per Week/30 Days ary Discharge Instructions: Apply to wound bed as instructed Secondary Dressing: ABD Pad, 8x10 1 x Per Week/30 Days Discharge Instructions: Apply over primary dressing as directed. Com pression Wrap: Urgo K2 Lite, (equivalent to a 3 layer) two layer compression system, regular 1 x Per Week/30 Days Discharge Instructions: Apply Urgo K2 Lite as directed (alternative to 3 layer compression). 09/15/2023: The wound was a little bit smaller today and the surface is  incredibly clean without any slough or eschar buildup. No odor or significant drainage. Edema control is excellent. No debridement was necessary today. We will continue Hydrofera Blue ready foam and Urgo light compression. Follow-up in 1 week. Electronic Signature(s) Signed: 09/15/2023 10:30:53 AM By: Duanne Guess MD FACS Entered By: Duanne Guess on 09/15/2023 07:30:53 -------------------------------------------------------------------------------- HxROS Details Patient Name: Date of Service: [name], [name]  09/15/2023 10:00 A M Medical Record Number: [id number] Patient Account Number: [id number] Date of Birth/Sex: Treating RN: 1975-11-26 (47 y.o. M) Primary Care Provider: Richarda Blade Other Clinician: Referring Provider: Treating Provider/Extender: Jason Nest, Dinah Weeks in Treatment: 3 Information Obtained From Patient Chart Constitutional Symptoms (General Health) Medical History: Past Medical History Notes: morbid obesity, migraines Eyes Medical History: Negative for: Cataracts; Glaucoma; Optic Neuritis Ear/Nose/Mouth/Throat Medical History: Negative for: Chronic sinus problems/congestion; Middle ear problems Past Medical History Notes: nasal congestion Hematologic/Lymphatic Medical History: Negative for: Anemia; Hemophilia; Human Immunodeficiency Virus; Lymphedema; Sickle Cell Disease Respiratory Medical History: Positive for: Sleep Apnea - CPAP Negative for: Aspiration; Asthma; Chronic Obstructive Pulmonary Disease (COPD); Pneumothorax; Tuberculosis Cardiovascular Medical History: Positive for: Hypertension - [name]/p has been running high due to running out of medication [name], [name] ([id number]) [id number] Page 8 of 9 Negative for: Angina; Arrhythmia; Congestive Heart Failure; Coronary Artery Disease; Deep Vein Thrombosis; Hypotension; Myocardial Infarction; Peripheral Arterial Disease; Peripheral Venous Disease;  Phlebitis; Vasculitis Past Medical History Notes: hyperlipidemia Gastrointestinal Medical History: Negative for: Cirrhosis ; Colitis; Crohns; Hepatitis A; Hepatitis [name]; Hepatitis C Endocrine Medical History: Positive for: Type II Diabetes Negative for: Type I Diabetes Past Medical History Notes: hypogonadism, low testosterone Time with diabetes: 1 year Treated with: Oral agents Blood sugar tested every day: No Genitourinary Medical History: Negative for: End Stage Renal Disease Past Medical History Notes: CKD Immunological Medical History: Negative for: Lupus Erythematosus; Raynauds; Scleroderma Integumentary (Skin) Medical History: Negative for: History of Burn Past Medical History Notes: hand dermatitis Musculoskeletal Medical History: Negative for: Gout; Rheumatoid Arthritis; Osteoarthritis; Osteomyelitis Neurologic Medical History: Negative for: Dementia; Neuropathy; Quadriplegia; Paraplegia; Seizure Disorder Oncologic Medical History: Negative for: Received Chemotherapy; Received Radiation Psychiatric Medical History: Negative for: Anorexia/bulimia; Confinement Anxiety Past Medical History Notes: anxiety, depression, ADHD Immunizations Pneumococcal Vaccine: Received Pneumococcal Vaccination: Yes Received Pneumococcal Vaccination On or After 60th Birthday: No Tetanus Vaccine: Last tetanus shot: 05/25/2017 Implantable Devices None Hospitalization / Surgery History Type of Hospitalization/Surgery 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 Family and Social History [name], [name] ([id number]) [id number] Page 9 of 9 Cancer: Yes - Maternal Grandparents; Diabetes: Yes - Paternal Grandparents; Heart Disease: No; Hereditary Spherocytosis: No; Hypertension: Yes - Mother; Kidney Disease: Yes - Father; Lung Disease: No; Seizures: No; Stroke: Yes - Paternal Grandparents; Thyroid Problems: No; Tuberculosis: No; Never smoker; Marital  Status - Married; Alcohol Use: Moderate; Drug Use: Prior History - T[employer] in college; Caffeine Use: Daily - coffee; Financial Concerns: No; Food, Clothing or Shelter Needs: No; Support System Lacking: No; Transportation Concerns: No Electronic Signature(s) Signed: 09/15/2023 10:34:59 AM By: Duanne Guess MD FACS Entered By: Duanne Guess on 09/15/2023 07:29:35 -------------------------------------------------------------------------------- SuperBill Details Patient Name: Date of Service: [name], [name] 09/15/2023 Medical Record Number: [id number] Patient Account Number: [id number] Date of Birth/Sex: Treating RN: [birth year] (47 y.o. M) Primary Care Provider: Richarda Blade Other Clinician: Referring Provider: Treating Provider/Extender: Jason Nest, Dinah Weeks in Treatment: 3 Diagnosis Coding ICD-10 Codes Code Description [phone] Non-pressure chronic ulcer of other part of right lower leg with unspecified severity E11.622 Type 2 diabetes mellitus with other skin ulcer E66.01 Morbid (severe) obesity due to excess calories Physician Procedures : CPT4 Code Description Modifier [id number] - WC PHYS LEVEL 3 - EST PT ICD-10 Diagnosis Description L97.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 with unspecified severity E11.622 Type 2 diabetes mellitus with other skin ulcer  E66.01 Morbid (severe) obesity due to excess calories Quantity: 1 Electronic Signature(s)  Signed: 09/15/2023 10:58:33 AM By: Duanne Guess MD FACS Entered By: Duanne Guess on 09/15/2023 07:58:33

## 2023-09-15 NOTE — Progress Notes (Signed)
RONALDINHO, KINSTLE ([id number]) [id number] Page 1 of 8 Visit Report for 09/15/2023 Arrival Information Details Patient Name: Date of Service: [name], [place] RD B. 09/15/2023 10:00 A M Medical Record Number: [id number] Patient Account Number: [id number] Date of Birth/Sex: Treating RN: Apr 07, 1976 (47 y.o. Marlan Palau Primary Care Robbi Spells: Richarda Blade Other Clinician: Referring Iveliz Garay: Treating Micala Saltsman/Extender: Pleas Patricia in Treatment: 3 Visit Information History Since Last Visit Added or deleted any medications: No Patient Arrived: Ambulatory Any new allergies or adverse reactions: No Arrival Time: 10:04 Had a fall or experienced change in No Accompanied By: wife activities of daily living that may affect Transfer Assistance: None risk of falls: Patient Identification Verified: Yes Signs or symptoms of abuse/neglect since last visito No Secondary Verification Process Completed: Yes Hospitalized since last visit: No Patient Requires Transmission-Based Precautions: No Implantable device outside of the clinic excluding No Patient Has Alerts: No cellular tissue based products placed in the center since last visit: Has Dressing in Place as Prescribed: Yes Has Compression in Place as Prescribed: Yes Pain Present Now: Yes Electronic Signature(s) Signed: 09/15/2023 3:33:50 PM By: Samuella Bruin Entered By: Samuella Bruin on 09/15/2023 07:04:40 -------------------------------------------------------------------------------- Compression Therapy Details Patient Name: Date of Service: [name], [name] 09/15/2023 10:00 A M Medical Record Number: [id number] Patient Account Number: [id number] Date of Birth/Sex: Treating RN: 02-Apr-1976 (47 y.o. Marlan Palau Primary Care [facility]: Richarda Blade Other Clinician: Referring Arcadia Gorgas: Treating Darene Nappi/Extender: Pleas Patricia in Treatment:  3 Compression Therapy Performed for Wound Assessment: Wound #2 Right,Anterior Lower Leg Performed By: Clinician Samuella Bruin, RN Compression Type: Double Layer Post Procedure Diagnosis Same as Pre-procedure Electronic Signature(s) Signed: 09/15/2023 3:33:50 PM By: Gelene Mink By: Samuella Bruin on 09/15/2023 07:14:25 [name] ([id number]) [id number] Page 2 of 8 -------------------------------------------------------------------------------- Encounter Discharge Information Details Patient Name: Date of Service: [name], [place] RD B. 09/15/2023 10:00 A M Medical Record Number: [id number] Patient Account Number: [id number] Date of Birth/Sex: Treating RN: 11/11/1975 (47 y.o. Marlan Palau Primary Care Masaki Rothbauer: Richarda Blade Other Clinician: Referring Ahamed Hofland: Treating Staysha Truby/Extender: Pleas Patricia in Treatment: 3 Encounter Discharge Information Items Discharge Condition: Stable Ambulatory Status: Ambulatory Discharge Destination: Home Transportation: Private Auto Accompanied By: wife Schedule Follow-up Appointment: Yes Clinical Summary of Care: Patient Declined Electronic Signature(s) Signed: 09/15/2023 3:33:50 PM By: Gelene Mink By: Samuella Bruin on 09/15/2023 07:25:06 -------------------------------------------------------------------------------- Lower Extremity Assessment Details Patient Name: Date of Service: [name], [name] 09/15/2023 10:00 A M Medical Record Number: [id number] Patient Account Number: [id number] Date of Birth/Sex: Treating RN: 1976/04/22 (47 y.o. Marlan Palau Primary Care Ranvir Renovato: Richarda Blade Other Clinician: Referring Josh Nicolosi: Treating Bosco Paparella/Extender: Duanne Guess [name], Dinah Weeks in Treatment: 3 Edema Assessment Assessed: [Left: No] [Right: No] [Left: Edema] [Right: :] Calf Left: Right: Point of Measurement: From  Medial Instep 41 cm Ankle Left: Right: Point of Measurement: From Medial Instep 25 cm Vascular Assessment Pulses: Dorsalis Pedis Palpable: [Right:Yes] Extremity colors, hair growth, and conditions: Hair Growth on Extremity: [Right:No] Temperature of Extremity: [Right:Warm] Capillary Refill: [Right:< 3 seconds] Dependent Rubor: [Right:No No] Electronic Signature(s) Signed: 09/15/2023 3:33:50 PM By: Samuella Bruin Entered By: Samuella Bruin on 09/15/2023 07:10:15 [name] ([id number]) [id number] Page 3 of 8 -------------------------------------------------------------------------------- Multi Wound Chart Details Patient Name: Date of Service: [name], [place] RD B. 09/15/2023 10:00 A M Medical Record Number: [id number] Patient Account Number: [id number] Date of Birth/Sex: Treating RN: 09/30/1976 (47 y.o. M) Primary Care Jocob Dambach: [name],  Dinah Other Clinician: Referring Shawneen Deetz: Treating Fortunata Betty/Extender: Jason Nest, Dinah Weeks in Treatment: 3 Vital Signs Height(in): 69 Pulse(bpm): 97 Weight(lbs): 330 Blood Pressure(mmHg): 153/96 Body Mass Index(BMI): 48.7 Temperature(F): 99 Respiratory Rate(breaths/min): 18 [2:Photos:] [N/A:N/A] Right, Anterior Lower Leg N/A N/A Wound Location: Bump N/A N/A Wounding Event: Diabetic Wound/Ulcer of the Lower N/A N/A Primary Etiology: Extremity Sleep Apnea, Hypertension, Type II N/A N/A Comorbid History: Diabetes 07/18/2023 N/A N/A Date Acquired: 3 N/A N/A Weeks of Treatment: Open N/A N/A Wound Status: No N/A N/A Wound Recurrence: 4x3.4x0.1 N/A N/A Measurements L x W x D (cm) 10.681 N/A N/A A (cm) : rea 1.068 N/A N/A Volume (cm) : 28.00% N/A N/A % Reduction in A rea: 28.00% N/A N/A % Reduction in Volume: Grade 2 N/A N/A Classification: Medium N/A N/A Exudate A mount: Serosanguineous N/A N/A Exudate Type: red, brown N/A N/A Exudate Color: Distinct, outline attached  N/A N/A Wound Margin: Large (67-100%) N/A N/A Granulation A mount: Red N/A N/A Granulation Quality: None Present (0%) N/A N/A Necrotic A mount: Fat Layer (Subcutaneous Tissue): Yes N/A N/A Exposed Structures: Fascia: No Tendon: No Muscle: No Joint: No Bone: No Medium (34-66%) N/A N/A Epithelialization: Scarring: Yes N/A N/A Periwound Skin Texture: No Abnormalities Noted N/A N/A Periwound Skin Moisture: Hemosiderin Staining: Yes N/A N/A Periwound Skin Color: No Abnormality N/A N/A Temperature: Compression Therapy N/A N/A Procedures Performed: Treatment Notes Wound #2 (Lower Leg) Wound Laterality: Right, Anterior Cleanser Soap and Water Discharge Instruction: May shower and wash wound with dial antibacterial soap and water prior to dressing change. Vashe 5.8 (oz) Discharge Instruction: Cleanse the wound with Vashe prior to applying a clean dressing using gauze sponges, not tissue or cotton balls. AMIIR, WOODING ([id number]) [id number] Page 4 of 8 Peri-Wound Care Sween Lotion (Moisturizing lotion) Discharge Instruction: Apply moisturizing lotion as directed Topical Gentamicin Discharge Instruction: As directed by physician Primary Dressing Hydrofera Blue Ready Transfer Foam, 4x5 (in/in) Discharge Instruction: Apply to wound bed as instructed Secondary Dressing ABD Pad, 8x10 Discharge Instruction: Apply over primary dressing as directed. Secured With Compression Wrap Urgo K2 Lite, (equivalent to a 3 layer) two layer compression system, regular Discharge Instruction: Apply Urgo K2 Lite as directed (alternative to 3 layer compression). Compression Stockings Add-Ons Electronic Signature(s) Signed: 09/15/2023 10:28:36 AM By: Duanne Guess MD FACS Entered By: Duanne Guess on 09/15/2023 07:28:36 -------------------------------------------------------------------------------- Multi-Disciplinary Care Plan Details Patient Name: Date of  Service: [name], [name] 09/15/2023 10:00 A M Medical Record Number: [id number] Patient Account Number: [id number] Date of Birth/Sex: Treating RN: 1976-04-01 (47 y.o. Marlan Palau Primary Care Rashonda Warrior: Richarda Blade Other Clinician: Referring Delvonte Berenson: Treating Katelan Hirt/Extender: Duanne Guess [name], Dinah Weeks in Treatment: 3 Active Inactive Wound/Skin Impairment Nursing Diagnoses: Knowledge deficit related to ulceration/compromised skin integrity Goals: Patient/caregiver will verbalize understanding of skin care regimen Date Initiated: 08/20/2023 Target Resolution Date: 10/03/2023 Goal Status: Active Interventions: Assess patient/caregiver ability to obtain necessary supplies Assess patient/caregiver ability to perform ulcer/skin care regimen upon admission and as needed Assess ulceration(s) every visit Provide education on ulcer and skin care Screen for HBO Treatment Activities: Skin care regimen initiated : 08/20/2023 Topical wound management initiated : 08/20/2023 Notes: KYREECE, LEHMAN ([id number]) [phone].pdf Page 5 of 8 Electronic Signature(s) Signed: 09/15/2023 3:33:50 PM By: Gelene Mink By: Samuella Bruin on 09/15/2023 07:24:26 -------------------------------------------------------------------------------- Pain Assessment Details Patient Name: Date of Service: [name], [name] 09/15/2023 10:00 A M Medical Record Number: [id number] Patient Account Number: [id number] Date of Birth/Sex: Treating RN: 01/10/1976 (  47 y.o. Marlan Palau Primary Care Nikiya Starn: Richarda Blade Other Clinician: Referring Ohm Dentler: Treating Dequann Vandervelden/Extender: Duanne Guess [name], Dinah Weeks in Treatment: 3 Active Problems Location of Pain Severity and Description of Pain Patient Has Paino Yes Site Locations Pain Location: Pain in Ulcers Duration of the Pain. Constant / Intermittento Constant Rate the  pain. Current Pain Level: 2 Character of Pain Describe the Pain: Aching Pain Management and Medication Current Pain Management: Medication: No Electronic Signature(s) Signed: 09/15/2023 3:33:50 PM By: Samuella Bruin Entered By: Samuella Bruin on 09/15/2023 07:05:31 -------------------------------------------------------------------------------- Patient/Caregiver Education Details Patient Name: Date of Service: [name] RD B. 11/12/2024andnbsp10:00 A M Medical Record Number: [id number] Patient Account Number: [id number] Date of Birth/Gender: Treating RN: [birth year] (47 y.o. Marlan Palau Primary Care Physician: Richarda Blade Other Clinician: Referring Physician: Treating Physician/Extender: Jason Nest, Dinah Weeks in Treatment: 3 KYZIER, ISGRO B ([id number]) [id number] Page 6 of 8 Education Assessment Education Provided To: Patient Education Topics Provided Wound/Skin Impairment: Methods: Explain/Verbal Responses: Reinforcements needed, State content correctly Electronic Signature(s) Signed: 09/15/2023 3:33:50 PM By: Samuella Bruin Entered By: Samuella Bruin on 09/15/2023 07:24:45 -------------------------------------------------------------------------------- Wound Assessment Details Patient Name: Date of Service: [name], [name] 09/15/2023 10:00 A M Medical Record Number: [id number] Patient Account Number: [id number] Date of Birth/Sex: Treating RN: 08-24-1976 (47 y.o. Marlan Palau Primary Care Jami Ohlin: Richarda Blade Other Clinician: Referring Meagen Limones: Treating Deeanna Beightol/Extender: Duanne Guess [name], Dinah Weeks in Treatment: 3 Wound Status Wound Number: 2 Primary Etiology: Diabetic Wound/Ulcer of the Lower Extremity Wound Location: Right, Anterior Lower Leg Wound Status: Open Wounding Event: Bump Comorbid History: Sleep Apnea, Hypertension, Type II Diabetes Date Acquired:  07/18/2023 Weeks Of Treatment: 3 Clustered Wound: No Photos Wound Measurements Length: (cm) 4 Width: (cm) 3.4 Depth: (cm) 0.1 Area: (cm) 10.681 Volume: (cm) 1.068 % Reduction in Area: 28% % Reduction in Volume: 28% Epithelialization: Medium (34-66%) Tunneling: No Undermining: No Wound Description Classification: Grade 2 Wound Margin: Distinct, outline attached Exudate Amount: Medium Exudate Type: Serosanguineous Exudate Color: red, brown Foul Odor After Cleansing: No Slough/Fibrino No Wound Bed Granulation Amount: Large (67-100%) Exposed Structure Granulation Quality: Red Fascia Exposed: No Hafer, Natthew B ([id number]) [id number] Page 7 of 8 Necrotic Amount: None Present (0%) Fat Layer (Subcutaneous Tissue) Exposed: Yes Tendon Exposed: No Muscle Exposed: No Joint Exposed: No Bone Exposed: No Periwound Skin Texture Texture Color No Abnormalities Noted: Yes No Abnormalities Noted: No Hemosiderin Staining: Yes Moisture No Abnormalities Noted: Yes Temperature / Pain Temperature: No Abnormality Treatment Notes Wound #2 (Lower Leg) Wound Laterality: Right, Anterior Cleanser Soap and Water Discharge Instruction: May shower and wash wound with dial antibacterial soap and water prior to dressing change. Vashe 5.8 (oz) Discharge Instruction: Cleanse the wound with Vashe prior to applying a clean dressing using gauze sponges, not tissue or cotton balls. Peri-Wound Care Sween Lotion (Moisturizing lotion) Discharge Instruction: Apply moisturizing lotion as directed Topical Gentamicin Discharge Instruction: As directed by physician Primary Dressing Hydrofera Blue Ready Transfer Foam, 4x5 (in/in) Discharge Instruction: Apply to wound bed as instructed Secondary Dressing ABD Pad, 8x10 Discharge Instruction: Apply over primary dressing as directed. Secured With Compression Wrap Urgo K2 Lite, (equivalent to a 3 layer) two layer compression system,  regular Discharge Instruction: Apply Urgo K2 Lite as directed (alternative to 3 layer compression). Compression Stockings Add-Ons Electronic Signature(s) Signed: 09/15/2023 3:33:50 PM By: Samuella Bruin Entered By: Samuella Bruin on 09/15/2023 07:12:20 -------------------------------------------------------------------------------- Vitals Details Patient Name: Date of Service: [name], [name] 09/15/2023 10:00 A M  Medical Record Number: [id number] Patient Account Number: [id number] Date of Birth/Sex: Treating RN: 24-May-1976 (47 y.o. Marlan Palau Primary Care Thijs Brunton: Richarda Blade Other Clinician: Referring Margarine Grosshans: Treating Xanthe Couillard/Extender: Duanne Guess [name], Dinah Weeks in Treatment: 3 Vital Signs Time Taken: 10:04 Temperature (F): 99 Height (in): 69 Pulse (bpm): 97 Haire, Lochlin B ([id number]) [id number] Page 8 of 8 Weight (lbs): 330 Respiratory Rate (breaths/min): 18 Body Mass Index (BMI): 48.7 Blood Pressure (mmHg): 153/96 Reference Range: 80 - 120 mg / dl Electronic Signature(s) Signed: 09/15/2023 3:33:50 PM By: Samuella Bruin Entered By: Samuella Bruin on 09/15/2023 07:05:09

## 2023-09-16 ENCOUNTER — Ambulatory Visit: Payer: BC Managed Care – PPO | Admitting: Psychology

## 2023-09-16 ENCOUNTER — Other Ambulatory Visit: Payer: Self-pay

## 2023-09-16 DIAGNOSIS — F4323 Adjustment disorder with mixed anxiety and depressed mood: Secondary | ICD-10-CM

## 2023-09-16 DIAGNOSIS — I10 Essential (primary) hypertension: Secondary | ICD-10-CM

## 2023-09-16 DIAGNOSIS — E1169 Type 2 diabetes mellitus with other specified complication: Secondary | ICD-10-CM

## 2023-09-16 DIAGNOSIS — E785 Hyperlipidemia, unspecified: Secondary | ICD-10-CM

## 2023-09-16 NOTE — Progress Notes (Signed)
[facility] Counselor/Therapist Progress Note  Patient ID: GERSON ARMELIN, MRN: [id number],    Date: 09/16/2023  Time Spent: 45 mins; start time: 0800; end time: 0845  Treatment Type: Individual Therapy  Reported Symptoms: Pt presents for session via Caregility video.  Pt grants consent for session, stating he is in his home with no one else present; pt also shares that he understands the limitations of virtual session.  I shared with pt that I am in my office with no one else here either.  Mental Status Exam: Appearance:  Casual     Behavior: Appropriate  Motor: Normal  Speech/Language:  Clear and Coherent  Affect: Appropriate  Mood: normal  Thought process: normal  Thought content:   WNL  Sensory/Perceptual disturbances:   WNL  Orientation: oriented to person, place, and time/date  Attention: Good  Concentration: Good  Memory: WNL  Fund of knowledge:  Good  Insight:   Good  Judgment:  Good  Impulse Control: Good   Risk Assessment: Danger to Self:  No Self-injurious Behavior: No Danger to Others: No Duty to Warn:no Physical Aggression / Violence:No  Access to Firearms a concern: No  Gang Involvement:No   Subjective: Pt shares that, "I have had some ups and downs.  If outcomes matter, I am really good."  Pt shares that he has now been given permission to work from home and he notes "that is a very good thing."  Pt shares that, when he first got permission to work from home again, he got really anxious until he worked his first day back at home.  He is fine now.  He started working again at home this past Sunday for the first day.  "Changes make me anxious and this one did too, but I am fine now."   He is now working Sun, Mon, Thurs, Fri; all 10 hour days; 10am-7pm.  The benefits of working from home outweigh the benefits of having the optimal schedule for him.  Pt shares that his wife has fully recovered from her procedure last August and is doing fine now.  Pt  shares his oldest son is working a new job at [employer] and hopes to get a car soon.  His son who is a senior is trying to decide what he wants to do after graduation, either trade school or university.  His son who is a Holiday representative believes that he wants to go to a 4 yr university.  Pt shares that he has ADHD and went to college but did not graduate so he is focused on having his kids finish whatever they start.  He is trying to support his son in working 3rd shift and helping him get up on time to get to work; he has talked with his son about what his son needs from him to support him in this and they are working together well.  Pt shares his BP spiked this past Monday (175/105) and the doctor was not sure why it happened but he talked to the doctor and they got him through the crisis; ordinarily, his BP has been well controlled.  He still likes his new PCP.  Pt shares [facility] on his leg three times now and the wound seems to be on it's way to healing well now.  They do not believe the wound is related to his diabetes; his diabetes is currently well controlled at this time.  Pt shares  that his sleep has continued to be pretty good of late.  He is trying to keep himself on a good sleep schedule now that he is working from home again.  Encouraged pt to be intentional about engaging in his self care activities and we will meet in 2 weeks for a follow up session.  Interventions: Cognitive Behavioral Therapy  Diagnosis:Adjustment disorder with mixed anxiety and depressed mood  Plan: Treatment Plan Strengths/Abilities:  Intelligent, Intuitive, Willing to participate in therapy Treatment Preferences:  Outpatient Individual Therapy Statement of Needs:  Patient is to use CBT, mindfulness and coping skills to help manage and/or decrease symptoms associated with their diagnosis. Symptoms:  Depressed/Irritable mood, worry, social withdrawal Problems Addressed:   Depressive thoughts, Sadness, Sleep issues, etc. Long Term Goals:  Pt to reduce overall level, frequency, and intensity of the feelings of depression/anxiety as evidenced by decreased irritability, negative self talk, and helpless feelings from 6 to 7 days/week to 0 to 1 days/week, per client report, for at least 3 consecutive months.  Progress: 30% Short Term Goals:  Pt to verbally express understanding of the relationship between feelings of depression/anxiety and their impact on thinking patterns and behaviors.  Pt to verbalize an understanding of the role that distorted thinking plays in creating fears, excessive worry, and ruminations.  Progress: 30% Target Date:  04/24/2024 Frequency:  Bi-weekly Modality:  Cognitive Behavioral Therapy Interventions by Therapist:  Therapist will use CBT, Mindfulness exercises, Coping skills and Referrals, as needed by client. Client has verbally approved this treatment plan.  Karie Kirks, [facility]

## 2023-09-22 ENCOUNTER — Encounter (HOSPITAL_BASED_OUTPATIENT_CLINIC_OR_DEPARTMENT_OTHER): Payer: BC Managed Care – PPO | Admitting: General Surgery

## 2023-09-22 DIAGNOSIS — L97812 Non-pressure chronic ulcer of other part of right lower leg with fat layer exposed: Secondary | ICD-10-CM | POA: Diagnosis not present

## 2023-09-22 DIAGNOSIS — E11622 Type 2 diabetes mellitus with other skin ulcer: Secondary | ICD-10-CM | POA: Diagnosis not present

## 2023-09-22 DIAGNOSIS — I10 Essential (primary) hypertension: Secondary | ICD-10-CM | POA: Diagnosis not present

## 2023-09-22 DIAGNOSIS — E1151 Type 2 diabetes mellitus with diabetic peripheral angiopathy without gangrene: Secondary | ICD-10-CM | POA: Diagnosis not present

## 2023-09-22 DIAGNOSIS — L97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 with unspecified severity: Secondary | ICD-10-CM | POA: Diagnosis not present

## 2023-09-22 DIAGNOSIS — G4733 Obstructive sleep apnea (adult) (pediatric): Secondary | ICD-10-CM | POA: Diagnosis not present

## 2023-09-22 NOTE — Progress Notes (Signed)
ALVARO, CURRIN ([id number]) [id number] Page 1 of 7 Visit Report for 09/22/2023 Arrival Information Details Patient Name: Date of Service: [name], [place] RD [name] 09/22/2023 8:30 A M Medical Record Number: [id number] Patient Account Number: [id number] Date of Birth/Sex: Treating RN: [birth year] (47 y.o. [name] Primary Care Desmond Szabo: Richarda Blade Other Clinician: Referring Shamika Pedregon: Treating Billey Wojciak/Extender: Pleas Patricia in Treatment: 4 Visit Information History Since Last Visit Added or deleted any medications: No Patient Arrived: Ambulatory Any new allergies or adverse reactions: No Arrival Time: 08:46 Had a fall or experienced change in No Accompanied By: wife activities of daily living that may affect Transfer Assistance: None risk of falls: Patient Identification Verified: Yes Signs or symptoms of abuse/neglect since last visito No Secondary Verification Process Completed: Yes Hospitalized since last visit: No Patient Requires Transmission-Based Precautions: No Implantable device outside of the clinic excluding No Patient Has Alerts: No cellular tissue based products placed in the center since last visit: Has Dressing in Place as Prescribed: Yes Has Compression in Place as Prescribed: Yes Pain Present Now: No Electronic Signature(s) Signed: 09/22/2023 3:29:21 PM By: Samuella Bruin Entered By: Samuella Bruin on 09/22/2023 05:47:00 -------------------------------------------------------------------------------- Compression Therapy Details Patient Name: Date of Service: [name], [name] 09/22/2023 8:30 A M Medical Record Number: [id number] Patient Account Number: [id number] Date of Birth/Sex: Treating RN: [birth year] (47 y.o. [name] Primary Care Benoit Meech: Richarda Blade Other Clinician: Referring Julionna Marczak: Treating Bintou Lafata/Extender: Pleas Patricia in Treatment:  4 Compression Therapy Performed for Wound Assessment: Wound #2 Right,Anterior Lower Leg Performed By: Clinician Samuella Bruin, RN Compression Type: Double Layer Post Procedure Diagnosis Same as Pre-procedure Electronic Signature(s) Signed: 09/22/2023 3:29:21 PM By: Samuella Bruin Entered By: Samuella Bruin on 09/22/2023 06:02:40 [name] ([id number]) [id number] Page 2 of 7 -------------------------------------------------------------------------------- Encounter Discharge Information Details Patient Name: Date of Service: [name], [place] RD [name] 09/22/2023 8:30 A M Medical Record Number: [id number] Patient Account Number: [id number] Date of Birth/Sex: Treating RN: 1976-04-13 (47 y.o. [name] Primary Care Donovan Persley: Richarda Blade Other Clinician: Referring Cam Dauphin: Treating Alexzandra Bilton/Extender: Pleas Patricia in Treatment: 4 Encounter Discharge Information Items Post Procedure Vitals Discharge Condition: Stable Temperature (F): 98.3 Ambulatory Status: Ambulatory Pulse (bpm): 92 Discharge Destination: Home Respiratory Rate (breaths/min): 16 Transportation: Private Auto Blood Pressure (mmHg): 156/100 Accompanied By: wife Schedule Follow-up Appointment: Yes Clinical Summary of Care: Patient Declined Electronic Signature(s) Signed: 09/22/2023 3:29:21 PM By: Samuella Bruin Entered By: Samuella Bruin on 09/22/2023 06:12:35 -------------------------------------------------------------------------------- Lower Extremity Assessment Details Patient Name: Date of Service: [name], [name] 09/22/2023 8:30 A M Medical Record Number: [id number] Patient Account Number: [id number] Date of Birth/Sex: Treating RN: 1976/05/29 (47 y.o. [name] Primary Care Otto Caraway: Richarda Blade Other Clinician: Referring Nakul Avino: Treating Thalya Fouche/Extender: Duanne Guess [name], Dinah Weeks in Treatment:  4 Edema Assessment Assessed: [Left: No] [Right: No] [Left: Edema] [Right: :] Calf Left: Right: Point of Measurement: From Medial Instep 42.5 cm Ankle Left: Right: Point of Measurement: From Medial Instep 25.3 cm Vascular Assessment Pulses: Dorsalis Pedis Palpable: [Right:Yes] Extremity colors, hair growth, and conditions: Extremity Color: [Right:Hyperpigmented] Hair Growth on Extremity: [Right:No] Temperature of Extremity: [Right:Warm] Capillary Refill: [Right:< 3 seconds] Dependent Rubor: [Right:No No] Electronic Signature(s) Signed: 09/22/2023 3:29:21 PM By: Samuella Bruin Entered By: Samuella Bruin on 09/22/2023 05:51:32 [name] ([id number]) [id number] Page 3 of 7 -------------------------------------------------------------------------------- Multi Wound Chart Details Patient Name: Date of Service: [name], [place] RD [name] 09/22/2023 8:30 A M Medical  Record Number: [id number] Patient Account Number: [id number] Date of Birth/Sex: Treating RN: 07/21/1976 (47 y.o. M) Primary Care Cay Kath: Richarda Blade Other Clinician: Referring Corrie Reder: Treating Everlean Bucher/Extender: [name], Dinah Weeks in Treatment: 4 Vital Signs Height(in): 69 Pulse(bpm): 92 Weight(lbs): 330 Blood Pressure(mmHg): 156/100 Body Mass Index(BMI): 48.7 Temperature(F): 98.3 Respiratory Rate(breaths/min): 16 [2:Photos:] [N/A:N/A] Right, Anterior Lower Leg N/A N/A Wound Location: Bump N/A N/A Wounding Event: Diabetic Wound/Ulcer of the Lower N/A N/A Primary Etiology: Extremity Sleep Apnea, Hypertension, Type II N/A N/A Comorbid History: Diabetes 07/18/2023 N/A N/A Date Acquired: 4 N/A N/A Weeks of Treatment: Open N/A N/A Wound Status: No N/A N/A Wound Recurrence: 3.5x3.3x0.1 N/A N/A Measurements L x W x D (cm) 9.071 N/A N/A A (cm) : rea 0.907 N/A N/A Volume (cm) : 38.90% N/A N/A % Reduction in A rea: 38.90% N/A N/A % Reduction in  Volume: Grade 2 N/A N/A Classification: Medium N/A N/A Exudate A mount: Serosanguineous N/A N/A Exudate Type: red, brown N/A N/A Exudate Color: Distinct, outline attached N/A N/A Wound Margin: Large (67-100%) N/A N/A Granulation A mount: Red N/A N/A Granulation Quality: None Present (0%) N/A N/A Necrotic A mount: Fat Layer (Subcutaneous Tissue): Yes N/A N/A Exposed Structures: Fascia: No Tendon: No Muscle: No Joint: No Bone: No Medium (34-66%) N/A N/A Epithelialization: Debridement - Excisional N/A N/A Debridement: Pre-procedure Verification/Time Out 09:03 N/A N/A Taken: Lidocaine 5% topical ointment N/A N/A Pain Control: Subcutaneous N/A N/A Tissue Debrided: Skin/Subcutaneous Tissue N/A N/A Level: 9.07 N/A N/A Debridement A (sq cm): rea Curette N/A N/A Instrument: Minimum N/A N/A Bleeding: Pressure N/A N/A Hemostasis A chieved: Procedure was tolerated well N/A N/A Debridement Treatment Response: 3.5x3.3x0.1 N/A N/A Post Debridement Measurements L x W x D (cm) 0.907 N/A N/A Post Debridement Volume: (cm) Scarring: Yes N/A N/A Periwound Skin Texture: No Abnormalities Noted N/A N/A Periwound Skin Moisture: Hemosiderin Staining: Yes N/A N/A Periwound Skin ColorKAELEB, [name] ([id number]) [id number] Page 4 of 7 No Abnormality N/A N/A Temperature: Compression Therapy N/A N/A Procedures Performed: Debridement Treatment Notes Electronic Signature(s) Signed: 09/22/2023 9:09:25 AM By: Duanne Guess MD FACS Entered By: Duanne Guess on 09/22/2023 06:09:25 -------------------------------------------------------------------------------- Multi-Disciplinary Care Plan Details Patient Name: Date of Service: [name], [name] 09/22/2023 8:30 A M Medical Record Number: [id number] Patient Account Number: [id number] Date of Birth/Sex: Treating RN: 23-Feb-1976 (47 y.o. [name] Primary Care Stuart Mirabile: Richarda Blade Other  Clinician: Referring Brady Plant: Treating Earla Charlie/Extender: Duanne Guess [name], Dinah Weeks in Treatment: 4 Active Inactive Wound/Skin Impairment Nursing Diagnoses: Knowledge deficit related to ulceration/compromised skin integrity Goals: Patient/caregiver will verbalize understanding of skin care regimen Date Initiated: 08/20/2023 Target Resolution Date: 10/31/2023 Goal Status: Active Interventions: Assess patient/caregiver ability to obtain necessary supplies Assess patient/caregiver ability to perform ulcer/skin care regimen upon admission and as needed Assess ulceration(s) every visit Provide education on ulcer and skin care Screen for HBO Treatment Activities: Skin care regimen initiated : 08/20/2023 Topical wound management initiated : 08/20/2023 Notes: Electronic Signature(s) Signed: 09/22/2023 3:29:21 PM By: Samuella Bruin Entered By: Samuella Bruin on 09/22/2023 06:11:50 -------------------------------------------------------------------------------- Pain Assessment Details Patient Name: Date of Service: [name], [name] 09/22/2023 8:30 A M Medical Record Number: [id number] Patient Account Number: [id number] Date of Birth/Sex: Treating RN: 24-Nov-1975 (47 y.o. [name] Primary Care Octavie Westerhold: Richarda Blade Other Clinician: Referring Janely Gullickson: Treating Brean Carberry/Extender: [name], Dinah Weeks in Treatment: [address] [place] [name] ([id number]) [id number] Page 5 of 7 Active Problems Location of Pain Severity and Description of Pain Patient  Has Paino No Site Locations Rate the pain. Current Pain Level: 0 Pain Management and Medication Current Pain Management: Electronic Signature(s) Signed: 09/22/2023 3:29:21 PM By: Samuella Bruin Entered By: Samuella Bruin on 09/22/2023 05:47:17 -------------------------------------------------------------------------------- Patient/Caregiver Education  Details Patient Name: Date of Service: [name] 11/19/2024andnbsp8:30 A M Medical Record Number: [id number] Patient Account Number: [id number] Date of Birth/Gender: Treating RN: 1975/11/29 (47 y.o. [name] Primary Care Physician: Richarda Blade Other Clinician: Referring Physician: Treating Physician/Extender: Pleas Patricia in Treatment: 4 Education Assessment Education Provided To: Patient Education Topics Provided Wound/Skin Impairment: Methods: Explain/Verbal Responses: Reinforcements needed, State content correctly Electronic Signature(s) Signed: 09/22/2023 3:29:21 PM By: Samuella Bruin Entered By: Samuella Bruin on 09/22/2023 06:11:59 [name] ([id number]) [id number] Page 6 of 7 -------------------------------------------------------------------------------- Wound Assessment Details Patient Name: Date of Service: [name] 09/22/2023 8:30 A M Medical Record Number: [id number] Patient Account Number: [id number] Date of Birth/Sex: Treating RN: 17-Dec-1975 (47 y.o. [name] Primary Care Roselynn Whitacre: Richarda Blade Other Clinician: Referring Olinda Nola: Treating [name] Olgin/Extender: Duanne Guess [name], Dinah Weeks in Treatment: 4 Wound Status Wound Number: 2 Primary Etiology: Diabetic Wound/Ulcer of the Lower Extremity Wound Location: Right, Anterior Lower Leg Wound Status: Open Wounding Event: Bump Comorbid History: Sleep Apnea, Hypertension, Type II Diabetes Date Acquired: 07/18/2023 Weeks Of Treatment: 4 Clustered Wound: No Photos Wound Measurements Length: (cm) 3.5 Width: (cm) 3.3 Depth: (cm) 0.1 Area: (cm) 9.071 Volume: (cm) 0.907 % Reduction in Area: 38.9% % Reduction in Volume: 38.9% Epithelialization: Medium (34-66%) Tunneling: No Undermining: No Wound Description Classification: Grade 2 Wound Margin: Distinct, outline attached Exudate Amount:  Medium Exudate Type: Serosanguineous Exudate Color: red, brown Foul Odor After Cleansing: No Slough/Fibrino No Wound Bed Granulation Amount: Large (67-100%) Exposed Structure Granulation Quality: Red Fascia Exposed: No Necrotic Amount: None Present (0%) Fat Layer (Subcutaneous Tissue) Exposed: Yes Tendon Exposed: No Muscle Exposed: No Joint Exposed: No Bone Exposed: No Periwound Skin Texture Texture Color No Abnormalities Noted: Yes No Abnormalities Noted: No Hemosiderin Staining: Yes Moisture No Abnormalities Noted: Yes Temperature / Pain Temperature: No Abnormality Treatment Notes Wound #2 (Lower Leg) Wound Laterality: Right, Anterior Cleanser Soap and Water Discharge Instruction: May shower and wash wound with dial antibacterial soap and water prior to dressing change. Vashe 5.8 (oz) Discharge Instruction: Cleanse the wound with Vashe prior to applying a clean dressing using gauze sponges, not tissue or cotton balls. Peri-Wound Care [name], [name] ([id number]) [id number] Page 7 of 7 Sween Lotion (Moisturizing lotion) Discharge Instruction: Apply moisturizing lotion as directed Topical Primary Dressing Hydrofera Blue Ready Transfer Foam, 4x5 (in/in) Discharge Instruction: Apply to wound bed as instructed Secondary Dressing ABD Pad, 8x10 Discharge Instruction: Apply over primary dressing as directed. Secured With Compression Wrap Urgo K2 Lite, (equivalent to a 3 layer) two layer compression system, regular Discharge Instruction: Apply Urgo K2 Lite as directed (alternative to 3 layer compression). Compression Stockings Add-Ons Electronic Signature(s) Signed: 09/22/2023 3:29:21 PM By: Samuella Bruin Entered By: Samuella Bruin on 09/22/2023 05:53:46 -------------------------------------------------------------------------------- Vitals Details Patient Name: Date of Service: [name], [name] 09/22/2023 8:30 A M Medical Record  Number: [id number] Patient Account Number: [id number] Date of Birth/Sex: Treating RN: [birth year] (47 y.o. [name] Primary Care Layla Kesling: Richarda Blade Other Clinician: Referring Jacarius Handel: Treating Alayna Mabe/Extender: Duanne Guess [name], Dinah Weeks in Treatment: 4 Vital Signs Time Taken: 08:47 Temperature (F): 98.3 Height (in): 69 Pulse (bpm): 92 Weight (lbs): 330 Respiratory Rate (breaths/min): 16 Body Mass Index (BMI): 48.7  Blood Pressure (mmHg): 156/100 Reference Range: 80 - 120 mg / dl Electronic Signature(s) Signed: 09/22/2023 3:29:21 PM By: Samuella Bruin Entered By: Samuella Bruin on 09/22/2023 05:47:11

## 2023-09-22 NOTE — Progress Notes (Signed)
DARRNELL, KIPPS ([id number]) [id number] Page 1 of 10 Visit Report for 09/22/2023 Chief Complaint Document Details Patient Name: Date of Service: [name] RD [name] 09/22/2023 8:30 A M Medical Record Number: [id number] Patient Account Number: [id number] Date of Birth/Sex: Treating RN: 08-Dec-1975 (47 y.o. M) Primary Care Provider: Richarda Blade Other Clinician: Referring Provider: Treating Provider/Extender: Jason Nest, Dinah Weeks in Treatment: 4 Information Obtained from: Patient Chief Complaint 11/23/2018; patient is here for review of an ulcer on the left posterior calf 10/06/2019; patient is here for review of the wound area from the previous visit which is on the left posterior lateral calf 08/20/2023: here for new wound on RLE anterior Electronic Signature(s) Signed: 09/22/2023 9:10:05 AM By: Duanne Guess MD FACS Entered By: Duanne Guess on 09/22/2023 09:10:05 -------------------------------------------------------------------------------- Debridement Details Patient Name: Date of Service: [name], [name] 09/22/2023 8:30 A M Medical Record Number: [id number] Patient Account Number: [id number] Date of Birth/Sex: Treating RN: 03-05-1976 (47 y.o. [name] Primary Care Provider: Richarda Blade Other Clinician: Referring Provider: Treating Provider/Extender: Pleas Patricia in Treatment: 4 Debridement Performed for Assessment: Wound #2 Right,Anterior Lower Leg Performed By: Physician Duanne Guess, MD The following information was scribed by: Samuella Bruin The information was scribed for: Duanne Guess Debridement Type: Debridement Severity of Tissue Pre Debridement: Fat layer exposed Level of Consciousness (Pre-procedure): Awake and Alert Pre-procedure Verification/Time Out Yes - 09:03 Taken: Start Time: 09:03 Pain Control: Lidocaine 5% topical ointment Percent of Wound Bed Debrided:  100% T Area Debrided (cm): otal 9.07 Tissue and other material debrided: Non-Viable, Subcutaneous, Biofilm Level: Skin/Subcutaneous Tissue Debridement Description: Excisional Instrument: Curette Bleeding: Minimum Hemostasis Achieved: Pressure Response to Treatment: Procedure was tolerated well Level of Consciousness (Post- Awake and Alert procedure): Post Debridement Measurements of Total Wound Length: (cm) 3.5 Width: (cm) 3.3 Depth: (cm) 0.1 Volume: (cm) 0.907 [name], [name] ([id number]) [id number] Page 2 of 10 Character of Wound/Ulcer Post Debridement: Improved Severity of Tissue Post Debridement: Fat layer exposed Post Procedure Diagnosis Same as Pre-procedure Electronic Signature(s) Signed: 09/22/2023 10:29:35 AM By: Duanne Guess MD FACS Signed: 09/22/2023 3:29:21 PM By: Samuella Bruin Entered By: Samuella Bruin on 09/22/2023 09:03:55 -------------------------------------------------------------------------------- HPI Details Patient Name: Date of Service: [name], [name] 09/22/2023 8:30 A M Medical Record Number: [id number] Patient Account Number: [id number] Date of Birth/Sex: Treating RN: 1976/09/23 (47 y.o. M) Primary Care Provider: Richarda Blade Other Clinician: Referring Provider: Treating Provider/Extender: Jason Nest, Dinah Weeks in Treatment: 4 History of Present Illness HPI Description: Admission 11/23/2018 This is a 47 year old man with type 2 diabetes. He tells [place] that about a week before Thanksgiving he noticed a small painful area on the back of his left mid calf. He saw a physician at urgent care on 10/10/2018 noted to have an open wound on the left calf. He was given Bactroban and Keflex as CandS was apparently negative. He saw his own primary doctor on 612/31/19 and was given Silvadene and doxycycline. He has been using Silvadene since then. He is saw his primary physician again on 1/10 was offered an  Radio broadcast assistant but I do not think that he actually use this. The area in question is on the left posterior calf. There is some question that this was a insect bite and when he first presented but he certainly did not notice his any particular bite injury at that time. This did actually apparently at one point look as though it was progressing towards closure although it  then it opened again. He has been using Silvadene for the past 3 weeks Past medical history; obstructive sleep apnea, Achilles tendinitis, hypertension, type 2 diabetes, cholecystectomy, fatty liver, ABI in this clinic was 1.38 on the left 11/30/2018; the area is essentially unchanged in size although the surface of the wound looks better. He has surrounding inflammation around the wound the exact etiology of this is unclear. 12/07/2018; the area has come down in diameter. Surface requires debridement. We have been using silver collagen. Is the wound seems to have come down in size I have elected not to biopsy this. Surrounding inflammation is also less 2/11; the area continues to come down in size and generally looks healthy. We have been using silver collagen under compression surrounding inflammation is less there is no tenderness 2/18; the area continues to contract in size and looks healthy. We have been using silver collagen under compression 2/25; no major change in dimension this week. However the wound bed continues to look healthy and there appears to be less inflammation around the wound 3/3; the wound measures slightly smaller. Healthy looking surface. Less inflammation 3/10; wound is closed. Healthy looking surface. Still some discoloration around the wound area but absolutely no tenderness. The cause of this wound was never really determined. However it progressed nicely towards healing READMISSION 10/06/2019 This is a patient I dealt with with an unusual wound on the left posterior lateral calf in the early part of this year.  I was never really completely sure I understood what this was in terms of causation. However wde use silver collagen under compression and the area responded nicely and we are able to discharge him after roughly 6 weeks in the clinic. He did not clearly have arterial or venous issues. I had some thoughts about biopsying this area however acid continued to get better I did not go through with this. He states that everything is been fine up until the last few weeks. His wife noted a scab with perhaps some purulent drainage. They called primary care and was prescribed doxy which she is finished and everything seems to have settled down there was no open area however the patient wanted [place] to look at this. READMISSION 08/20/2023 ***ABIs L: 1.04; R: 1.11*** This is a now 47 year old type II diabetic (last hemoglobin A1c 6.7%) who returns with a new wound on his right lower extremity, anterior tibial surface. He says it began exactly the way he is previous wound that led to wound care center admission started. He was mowing the lawn and developed a bump on his leg. It subsequently broke down resulting in a large ulcer. Patient was seen in the emergency department on 04 August 2023. He was given a prescription for Keflex and Bactrim. He was referred to the wound care center for further evaluation and management. 08/28/2023: The wound measured a little bit larger today and has a nonviable surface. There is still an odor coming from the wound. Edema control is good. The culture that I took last week grew out a polymicrobial population but all organisms were at fairly low levels. 09/02/2023: The wound size was stable today, but it is markedly cleaner with just a little bit of slough and eschar around the edges. The odor has abated. He has had quite a bit of drainage. Edema control is excellent. 09/15/2023: The wound was a little bit smaller today and the surface is incredibly clean without any slough or eschar  buildup. No odor or significant drainage. Edema  control is excellent. MAANAS, HARAN ([id number]) [id number] Page 3 of 10 09/22/2023: The wound is smaller with healthy and robust granulation tissue filling in. Edema control remains excellent. Electronic Signature(s) Signed: 09/22/2023 9:10:50 AM By: Duanne Guess MD FACS Entered By: Duanne Guess on 09/22/2023 09:10:50 -------------------------------------------------------------------------------- Physical Exam Details Patient Name: Date of Service: [name], [name] 09/22/2023 8:30 A M Medical Record Number: [id number] Patient Account Number: [id number] Date of Birth/Sex: Treating RN: [birth year] (47 y.o. M) Primary Care Provider: Richarda Blade Other Clinician: Referring Provider: Treating Provider/Extender: Jason Nest, Dinah Weeks in Treatment: 4 Constitutional Hypertensive, asymptomatic. . . . no acute distress. Respiratory Normal work of breathing on room air.. Notes 09/22/2023: The wound is smaller with healthy and robust granulation tissue filling in. Edema control remains excellent. Electronic Signature(s) Signed: 09/22/2023 9:12:38 AM By: Duanne Guess MD FACS Entered By: Duanne Guess on 09/22/2023 09:12:38 -------------------------------------------------------------------------------- Physician Orders Details Patient Name: Date of Service: [name], [name] 09/22/2023 8:30 A M Medical Record Number: [id number] Patient Account Number: [id number] Date of Birth/Sex: Treating RN: [birth year] (47 y.o. [name] Primary Care Provider: Richarda Blade Other Clinician: Referring Provider: Treating Provider/Extender: Pleas Patricia in Treatment: 4 The following information was scribed by: Samuella Bruin The information was scribed for: Duanne Guess Verbal / Phone Orders: No Diagnosis Coding ICD-10 Coding Code Description L97.819  Non-pressure chronic ulcer of other part of right lower leg with unspecified severity E11.622 Type 2 diabetes mellitus with other skin ulcer E66.01 Morbid (severe) obesity due to excess calories Follow-up Appointments ppointment in 1 week. - Dr. Lady Gary Room 2 Return A Anesthetic (In clinic) Topical Lidocaine 5% applied to wound bed Bathing/ Shower/ Hygiene May shower with protection but do not get wound dressing(s) wet. Protect dressing(s) with water repellant cover (for example, large plastic bag) or a cast cover and may then take shower. [name], [name] ([id number]) [id number] Page 4 of 10 Edema Control - Orders / Instructions Elevate legs to the level of the heart or above for 30 minutes daily and/or when sitting for 3-4 times a day throughout the day. A void standing for long periods of time. If compression wraps slide down please call wound center and speak with a nurse. Wound Treatment Wound #2 - Lower Leg Wound Laterality: Right, Anterior Cleanser: Soap and Water 1 x Per Week/30 Days Discharge Instructions: May shower and wash wound with dial antibacterial soap and water prior to dressing change. Cleanser: Vashe 5.8 (oz) 1 x Per Week/30 Days Discharge Instructions: Cleanse the wound with Vashe prior to applying a clean dressing using gauze sponges, not tissue or cotton balls. Peri-Wound Care: Sween Lotion (Moisturizing lotion) 1 x Per Week/30 Days Discharge Instructions: Apply moisturizing lotion as directed Prim Dressing: Hydrofera Blue Ready Transfer Foam, 4x5 (in/in) 1 x Per Week/30 Days ary Discharge Instructions: Apply to wound bed as instructed Secondary Dressing: ABD Pad, 8x10 1 x Per Week/30 Days Discharge Instructions: Apply over primary dressing as directed. Compression Wrap: Urgo K2 Lite, (equivalent to a 3 layer) two layer compression system, regular 1 x Per Week/30 Days Discharge Instructions: Apply Urgo K2 Lite as directed (alternative to  3 layer compression). Patient Medications llergies: No Known Allergies A Notifications Medication Indication Start End 09/22/2023 lidocaine DOSE topical 5 % ointment - ointment topical Electronic Signature(s) Signed: 09/22/2023 10:29:35 AM By: Duanne Guess MD FACS Entered By: Duanne Guess on 09/22/2023 09:13:01 -------------------------------------------------------------------------------- Problem List Details Patient Name: Date of Service: [name], [name]  [name] 09/22/2023 8:30 A M Medical Record Number: [id number] Patient Account Number: [id number] Date of Birth/Sex: Treating RN: 12/16/1975 (47 y.o. M) Primary Care Provider: Richarda Blade Other Clinician: Referring Provider: Treating Provider/Extender: Jason Nest, Dinah Weeks in Treatment: 4 Active Problems ICD-10 Encounter Code Description Active Date MDM Diagnosis L97.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 with unspecified 08/20/2023 No Yes severity E11.622 Type 2 diabetes mellitus with other skin ulcer 08/20/2023 No Yes E66.01 Morbid (severe) obesity due to excess calories 08/20/2023 No Yes [name], [name] ([id number]) [id number] Page 5 of 10 Inactive Problems Resolved Problems Electronic Signature(s) Signed: 09/22/2023 9:09:18 AM By: Duanne Guess MD FACS Entered By: Duanne Guess on 09/22/2023 09:09:18 -------------------------------------------------------------------------------- Progress Note Details Patient Name: Date of Service: [name], [name] 09/22/2023 8:30 A M Medical Record Number: [id number] Patient Account Number: [id number] Date of Birth/Sex: Treating RN: Apr 16, 1976 (47 y.o. M) Primary Care Provider: Richarda Blade Other Clinician: Referring Provider: Treating Provider/Extender: Jason Nest, Dinah Weeks in Treatment: 4 Subjective Chief Complaint Information obtained from Patient 11/23/2018; patient is here for  review of an ulcer on the left posterior calf 10/06/2019; patient is here for review of the wound area from the previous visit which is on the left posterior lateral calf 08/20/2023: here for new wound on RLE anterior History of Present Illness (HPI) Admission 11/23/2018 This is a 47 year old man with type 2 diabetes. He tells [place] that about a week before Thanksgiving he noticed a small painful area on the back of his left mid calf. He saw a physician at urgent care on 10/10/2018 noted to have an open wound on the left calf. He was given Bactroban and Keflex as CandS was apparently negative. He saw his own primary doctor on 612/31/19 and was given Silvadene and doxycycline. He has been using Silvadene since then. He is saw his primary physician again on 1/10 was offered an Radio broadcast assistant but I do not think that he actually use this. The area in question is on the left posterior calf. There is some question that this was a insect bite and when he first presented but he certainly did not notice his any particular bite injury at that time. This did actually apparently at one point look as though it was progressing towards closure although it then it opened again. He has been using Silvadene for the past 3 weeks Past medical history; obstructive sleep apnea, Achilles tendinitis, hypertension, type 2 diabetes, cholecystectomy, fatty liver, ABI in this clinic was 1.38 on the left 11/30/2018; the area is essentially unchanged in size although the surface of the wound looks better. He has surrounding inflammation around the wound the exact etiology of this is unclear. 12/07/2018; the area has come down in diameter. Surface requires debridement. We have been using silver collagen. Is the wound seems to have come down in size I have elected not to biopsy this. Surrounding inflammation is also less 2/11; the area continues to come down in size and generally looks healthy. We have been using silver collagen under  compression surrounding inflammation is less there is no tenderness 2/18; the area continues to contract in size and looks healthy. We have been using silver collagen under compression 2/25; no major change in dimension this week. However the wound bed continues to look healthy and there appears to be less inflammation around the wound 3/3; the wound measures slightly smaller. Healthy looking surface. Less inflammation 3/10; wound is closed. Healthy looking surface. Still some  discoloration around the wound area but absolutely no tenderness. The cause of this wound was never really determined. However it progressed nicely towards healing READMISSION 10/06/2019 This is a patient I dealt with with an unusual wound on the left posterior lateral calf in the early part of this year. I was never really completely sure I understood what this was in terms of causation. However wde use silver collagen under compression and the area responded nicely and we are able to discharge him after roughly 6 weeks in the clinic. He did not clearly have arterial or venous issues. I had some thoughts about biopsying this area however acid continued to get better I did not go through with this. He states that everything is been fine up until the last few weeks. His wife noted a scab with perhaps some purulent drainage. They called primary care and was prescribed doxy which she is finished and everything seems to have settled down there was no open area however the patient wanted [place] to look at this. READMISSION 08/20/2023 ***ABIs L: 1.04; R: 1.11*** This is a now 47 year old type II diabetic (last hemoglobin A1c 6.7%) who returns with a new wound on his right lower extremity, anterior tibial surface. He says it began exactly the way he is previous wound that led to wound care center admission started. He was mowing the lawn and developed a bump on his leg. It subsequently broke down resulting in a large ulcer. Patient was  seen in the emergency department on 04 August 2023. He was given a prescription for Keflex and Bactrim. He was referred to the wound care center for further evaluation and management. 08/28/2023: The wound measured a little bit larger today and has a nonviable surface. There is still an odor coming from the wound. Edema control is good. The culture that I took last week grew out a polymicrobial population but all organisms were at fairly low levels. 09/02/2023: The wound size was stable today, but it is markedly cleaner with just a little bit of slough and eschar around the edges. The odor has abated. He has had quite a bit of drainage. Edema control is excellent. [name], [name] ([id number]) [id number] Page 6 of 10 09/15/2023: The wound was a little bit smaller today and the surface is incredibly clean without any slough or eschar buildup. No odor or significant drainage. Edema control is excellent. 09/22/2023: The wound is smaller with healthy and robust granulation tissue filling in. Edema control remains excellent. Patient History Information obtained from Patient, Chart. Family History Cancer - Maternal Grandparents, Diabetes - Paternal Grandparents, Hypertension - Mother, Kidney Disease - Father, Stroke - Paternal Grandparents, No family history of Heart Disease, Hereditary Spherocytosis, Lung Disease, Seizures, Thyroid Problems, Tuberculosis. Social History Never smoker, Marital Status - Married, Alcohol Use - Moderate, Drug Use - Prior History - [employer] in college, Caffeine Use - Daily - coffee. Medical History Eyes Denies history of Cataracts, Glaucoma, Optic Neuritis Ear/Nose/Mouth/Throat Denies history of Chronic sinus problems/congestion, Middle ear problems Hematologic/Lymphatic Denies history of Anemia, Hemophilia, Human Immunodeficiency Virus, Lymphedema, Sickle Cell Disease Respiratory Patient has history of Sleep Apnea - CPAP Denies history of  Aspiration, Asthma, Chronic Obstructive Pulmonary Disease (COPD), Pneumothorax, Tuberculosis Cardiovascular Patient has history of Hypertension - [name]/p has been running high due to running out of medication Denies history of Angina, Arrhythmia, Congestive Heart Failure, Coronary Artery Disease, Deep Vein Thrombosis, Hypotension, Myocardial Infarction, Peripheral Arterial Disease, Peripheral Venous Disease, Phlebitis, Vasculitis Gastrointestinal Denies history of Cirrhosis ,  Colitis, Crohns, Hepatitis A, Hepatitis [name], Hepatitis C Endocrine Patient has history of Type II Diabetes Denies history of Type I Diabetes Genitourinary Denies history of End Stage Renal Disease Immunological Denies history of Lupus Erythematosus, Raynauds, Scleroderma Integumentary (Skin) Denies history of History of Burn Musculoskeletal Denies history of Gout, Rheumatoid Arthritis, Osteoarthritis, Osteomyelitis Neurologic Denies history of Dementia, Neuropathy, Quadriplegia, Paraplegia, Seizure Disorder Oncologic Denies history of Received Chemotherapy, Received Radiation Psychiatric Denies history of Anorexia/bulimia, Confinement Anxiety Hospitalization/Surgery History - cholecystectomy. - right ankle sugery for club foot. Medical A Surgical History Notes nd Constitutional Symptoms (General Health) morbid obesity, migraines Ear/Nose/Mouth/Throat nasal congestion Cardiovascular hyperlipidemia Endocrine hypogonadism, low testosterone Genitourinary CKD Integumentary (Skin) hand dermatitis Psychiatric anxiety, depression, ADHD Objective Constitutional Hypertensive, asymptomatic. no acute distress. Vitals Time Taken: 8:47 AM, Height: 69 in, Weight: 330 lbs, BMI: 48.7, Temperature: 98.3 F, Pulse: 92 bpm, Respiratory Rate: 16 breaths/min, Blood Pressure: 156/100 mmHg. Respiratory Normal work of breathing on room air.[name], [name] ([id number]) [id number] Page 7 of 10 General  Notes: 09/22/2023: The wound is smaller with healthy and robust granulation tissue filling in. Edema control remains excellent. Integumentary (Hair, Skin) Wound #2 status is Open. Original cause of wound was Bump. The date acquired was: 07/18/2023. The wound has been in treatment 4 weeks. The wound is located on the Right,Anterior Lower Leg. The wound measures 3.5cm length x 3.3cm width x 0.1cm depth; 9.071cm^2 area and 0.907cm^3 volume. There is Fat Layer (Subcutaneous Tissue) exposed. There is no tunneling or undermining noted. There is a medium amount of serosanguineous drainage noted. The wound margin is distinct with the outline attached to the wound base. There is large (67-100%) red granulation within the wound bed. There is no necrotic tissue within the wound bed. The periwound skin appearance had no abnormalities noted for texture. The periwound skin appearance had no abnormalities noted for moisture. The periwound skin appearance exhibited: Hemosiderin Staining. Periwound temperature was noted as No Abnormality. Assessment Active Problems ICD-10 Non-pressure chronic ulcer of other part of right lower leg with unspecified severity Type 2 diabetes mellitus with other skin ulcer Morbid (severe) obesity due to excess calories Procedures Wound #2 Pre-procedure diagnosis of Wound #2 is a Diabetic Wound/Ulcer of the Lower Extremity located on the Right,Anterior Lower Leg .Severity of Tissue Pre Debridement is: Fat layer exposed. There was a Excisional Skin/Subcutaneous Tissue Debridement with a total area of 9.07 sq cm performed by Duanne Guess, MD. With the following instrument(s): Curette to remove Non-Viable tissue/material. Material removed includes Subcutaneous Tissue and Biofilm and after achieving pain control using Lidocaine 5% topical ointment. No specimens were taken. A time out was conducted at 09:03, prior to the start of the procedure. A Minimum amount of bleeding was  controlled with Pressure. The procedure was tolerated well. Post Debridement Measurements: 3.5cm length x 3.3cm width x 0.1cm depth; 0.907cm^3 volume. Character of Wound/Ulcer Post Debridement is improved. Severity of Tissue Post Debridement is: Fat layer exposed. Post procedure Diagnosis Wound #2: Same as Pre-Procedure Pre-procedure diagnosis of Wound #2 is a Diabetic Wound/Ulcer of the Lower Extremity located on the Right,Anterior Lower Leg . There was a Double Layer Compression Therapy Procedure by Burt Ek, RN. Post procedure Diagnosis Wound #2: Same as Pre-Procedure Plan Follow-up Appointments: Return Appointment in 1 week. - Dr. Lady Gary Room 2 Anesthetic: (In clinic) Topical Lidocaine 5% applied to wound bed Bathing/ Shower/ Hygiene: May shower with protection but do not get wound dressing(s) wet. Protect dressing(s) with water repellant cover (for example,  large plastic bag) or a cast cover and may then take shower. Edema Control - Orders / Instructions: Elevate legs to the level of the heart or above for 30 minutes daily and/or when sitting for 3-4 times a day throughout the day. Avoid standing for long periods of time. If compression wraps slide down please call wound center and speak with a nurse. The following medication(s) was prescribed: lidocaine topical 5 % ointment ointment topical was prescribed at facility WOUND #2: - Lower Leg Wound Laterality: Right, Anterior Cleanser: Soap and Water 1 x Per Week/30 Days Discharge Instructions: May shower and wash wound with dial antibacterial soap and water prior to dressing change. Cleanser: Vashe 5.8 (oz) 1 x Per Week/30 Days Discharge Instructions: Cleanse the wound with Vashe prior to applying a clean dressing using gauze sponges, not tissue or cotton balls. Peri-Wound Care: Sween Lotion (Moisturizing lotion) 1 x Per Week/30 Days Discharge Instructions: Apply moisturizing lotion as directed Prim Dressing: Hydrofera Blue  Ready Transfer Foam, 4x5 (in/in) 1 x Per Week/30 Days ary Discharge Instructions: Apply to wound bed as instructed Secondary Dressing: ABD Pad, 8x10 1 x Per Week/30 Days Discharge Instructions: Apply over primary dressing as directed. Com pression Wrap: Urgo K2 Lite, (equivalent to a 3 layer) two layer compression system, regular 1 x Per Week/30 Days Discharge Instructions: Apply Urgo K2 Lite as directed (alternative to 3 layer compression). 09/22/2023: The wound is smaller with healthy and robust granulation tissue filling in. Edema control remains excellent. I used a curette to debride a thin layer of slough/biofilm and some subcutaneous tissue from the patient's wound. I think we can discontinue the topical antibiotics and just use Hydrofera Blue with Urgo light compression. Follow-up in a week. [name], [name] ([id number]) [id number] Page 8 of 10 Electronic Signature(s) Signed: 09/22/2023 9:13:42 AM By: Duanne Guess MD FACS Entered By: Duanne Guess on 09/22/2023 09:13:42 -------------------------------------------------------------------------------- HxROS Details Patient Name: Date of Service: [name], [name] 09/22/2023 8:30 A M Medical Record Number: [id number] Patient Account Number: [id number] Date of Birth/Sex: Treating RN: 22-Mar-1976 (47 y.o. M) Primary Care Provider: Richarda Blade Other Clinician: Referring Provider: Treating Provider/Extender: Jason Nest, Dinah Weeks in Treatment: 4 Information Obtained From Patient Chart Constitutional Symptoms (General Health) Medical History: Past Medical History Notes: morbid obesity, migraines Eyes Medical History: Negative for: Cataracts; Glaucoma; Optic Neuritis Ear/Nose/Mouth/Throat Medical History: Negative for: Chronic sinus problems/congestion; Middle ear problems Past Medical History Notes: nasal congestion Hematologic/Lymphatic Medical History: Negative for: Anemia;  Hemophilia; Human Immunodeficiency Virus; Lymphedema; Sickle Cell Disease Respiratory Medical History: Positive for: Sleep Apnea - CPAP Negative for: Aspiration; Asthma; Chronic Obstructive Pulmonary Disease (COPD); Pneumothorax; Tuberculosis Cardiovascular Medical History: Positive for: Hypertension - [name]/p has been running high due to running out of medication Negative for: Angina; Arrhythmia; Congestive Heart Failure; Coronary Artery Disease; Deep Vein Thrombosis; Hypotension; Myocardial Infarction; Peripheral Arterial Disease; Peripheral Venous Disease; Phlebitis; Vasculitis Past Medical History Notes: hyperlipidemia Gastrointestinal Medical History: Negative for: Cirrhosis ; Colitis; Crohns; Hepatitis A; Hepatitis [name]; Hepatitis C Endocrine Medical History: Positive for: Type II Diabetes Negative for: Type I Diabetes Past Medical History Notes: hypogonadism, low testosterone Time with diabetes: 1 year Treated with: Oral agents Blood sugar tested every day: No [name], [name] ([id number]) [phone].pdf Page 9 of 10 Genitourinary Medical History: Negative for: End Stage Renal Disease Past Medical History Notes: CKD Immunological Medical History: Negative for: Lupus Erythematosus; Raynauds; Scleroderma Integumentary (Skin) Medical History: Negative for: History of Burn Past Medical History Notes: hand dermatitis Musculoskeletal  Medical History: Negative for: Gout; Rheumatoid Arthritis; Osteoarthritis; Osteomyelitis Neurologic Medical History: Negative for: Dementia; Neuropathy; Quadriplegia; Paraplegia; Seizure Disorder Oncologic Medical History: Negative for: Received Chemotherapy; Received Radiation Psychiatric Medical History: Negative for: Anorexia/bulimia; Confinement Anxiety Past Medical History Notes: anxiety, depression, ADHD Immunizations Pneumococcal Vaccine: Received Pneumococcal Vaccination: Yes Received Pneumococcal Vaccination  On or After 60th Birthday: No Tetanus Vaccine: Last tetanus shot: 05/25/2017 Implantable Devices None Hospitalization / Surgery History Type of Hospitalization/Surgery 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 Family and Social History Cancer: Yes - Maternal Grandparents; Diabetes: Yes - Paternal Grandparents; Heart Disease: No; Hereditary Spherocytosis: No; Hypertension: Yes - Mother; Kidney Disease: Yes - Father; Lung Disease: No; Seizures: No; Stroke: Yes - Paternal Grandparents; Thyroid Problems: No; Tuberculosis: No; Never smoker; Marital Status - Married; Alcohol Use: Moderate; Drug Use: Prior History - [employer] in college; Caffeine Use: Daily - coffee; Financial Concerns: No; Food, Clothing or Shelter Needs: No; Support System Lacking: No; Transportation Concerns: No Electronic Signature(s) Signed: 09/22/2023 10:29:35 AM By: Duanne Guess MD FACS Entered By: Duanne Guess on 09/22/2023 09:10:57 [name] ([id number]) [id number] Page 10 of 10 -------------------------------------------------------------------------------- SuperBill Details Patient Name: Date of Service: [name], [place] RD [name] 09/22/2023 Medical Record Number: [id number] Patient Account Number: [id number] Date of Birth/Sex: Treating RN: 1975/11/17 (47 y.o. M) Primary Care Provider: Richarda Blade Other Clinician: Referring Provider: Treating Provider/Extender: Jason Nest, Dinah Weeks in Treatment: 4 Diagnosis Coding ICD-10 Codes Code Description [phone] Non-pressure chronic ulcer of other part of right lower leg with unspecified severity E11.622 Type 2 diabetes mellitus with other skin ulcer E66.01 Morbid (severe) obesity due to excess calories Facility Procedures : CPT4 Code: [id number] Description: 11042 - DEB SUBQ TISSUE 20 SQ CM/< ICD-10 Diagnosis Description L97.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 with unspecified se Modifier:  verity Quantity: 1 Physician Procedures : CPT4 Code Description Modifier [id number] - WC PHYS LEVEL 3 - EST PT ICD-10 Diagnosis Description L97.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 with unspecified severity E11.622 Type 2 diabetes mellitus with other skin ulcer  E66.01 Morbid (severe) obesity due to excess calories Quantity: 1 : [id number] 11042 - WC PHYS SUBQ TISS 20 SQ CM ICD-10 Diagnosis Description L97.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 with unspecified severity Quantity: 1 Electronic Signature(s) Signed: 09/22/2023 9:14:04 AM By: Duanne Guess MD FACS Entered By: Duanne Guess on 09/22/2023 09:14:04

## 2023-09-25 ENCOUNTER — Other Ambulatory Visit: Payer: BC Managed Care – PPO

## 2023-09-25 DIAGNOSIS — E785 Hyperlipidemia, unspecified: Secondary | ICD-10-CM | POA: Diagnosis not present

## 2023-09-25 DIAGNOSIS — I10 Essential (primary) hypertension: Secondary | ICD-10-CM | POA: Diagnosis not present

## 2023-09-25 DIAGNOSIS — E1169 Type 2 diabetes mellitus with other specified complication: Secondary | ICD-10-CM | POA: Diagnosis not present

## 2023-09-26 LAB — COMPLETE METABOLIC PANEL WITH GFR
AG Ratio: 2 (calc) (ref 1.0–2.5)
ALT: 15 U/L (ref 9–46)
AST: 11 U/L (ref 10–40)
Albumin: 4.6 g/dL (ref 3.6–5.1)
Alkaline phosphatase (APISO): 59 U/L (ref 36–130)
BUN/Creatinine Ratio: 15 (calc) (ref 6–22)
BUN: 21 mg/dL (ref 7–25)
CO2: 28 mmol/L (ref 20–32)
Calcium: 9.3 mg/dL (ref 8.6–10.3)
Chloride: 103 mmol/L (ref 98–110)
Creat: 1.37 mg/dL — ABNORMAL HIGH (ref 0.60–1.29)
Globulin: 2.3 g/dL (ref 1.9–3.7)
Glucose, Bld: 105 mg/dL — ABNORMAL HIGH (ref 65–99)
Potassium: 3.5 mmol/L (ref 3.5–5.3)
Sodium: 139 mmol/L (ref 135–146)
Total Bilirubin: 1.4 mg/dL — ABNORMAL HIGH (ref 0.2–1.2)
Total Protein: 6.9 g/dL (ref 6.1–8.1)
eGFR: 65 mL/min/{1.73_m2} (ref >=60)

## 2023-09-26 LAB — CBC WITH DIFFERENTIAL/PLATELET
Absolute Lymphocytes: 1174 {cells}/uL (ref 850–3900)
Absolute Monocytes: 292 {cells}/uL (ref 200–950)
Basophils Absolute: 40 {cells}/uL (ref 0–200)
Basophils Relative: 1.1 %
Eosinophils Absolute: 40 {cells}/uL (ref 15–500)
Eosinophils Relative: 1.1 %
HCT: 41 % (ref 38.5–50.0)
Hemoglobin: 13.9 g/dL (ref 13.2–17.1)
MCH: 29.4 pg (ref 27.0–33.0)
MCHC: 33.9 g/dL (ref 32.0–36.0)
MCV: 86.7 fL (ref 80.0–100.0)
MPV: 11.2 fL (ref 7.5–12.5)
Monocytes Relative: 8.1 %
Neutro Abs: 2056 {cells}/uL (ref 1500–7800)
Neutrophils Relative %: 57.1 %
Platelets: 117 10*3/uL — ABNORMAL LOW (ref 140–400)
RBC: 4.73 10*6/uL (ref 4.20–5.80)
RDW: 14.5 % (ref 11.0–15.0)
Total Lymphocyte: 32.6 %
WBC: 3.6 10*3/uL — ABNORMAL LOW (ref 3.8–10.8)

## 2023-09-26 LAB — HEMOGLOBIN A1C
Hgb A1c MFr Bld: 6.1 %{Hb} — ABNORMAL HIGH (ref <5.7)
Mean Plasma Glucose: 128 mg/dL
eAG (mmol/L): 7.1 mmol/L

## 2023-09-26 LAB — LIPID PANEL
Cholesterol: 127 mg/dL (ref <200)
HDL: 35 mg/dL — ABNORMAL LOW (ref >=40)
LDL Cholesterol (Calc): 69 mg/dL
Non-HDL Cholesterol (Calc): 92 mg/dL (ref <130)
Total CHOL/HDL Ratio: 3.6 (calc) (ref <5.0)
Triglycerides: 155 mg/dL — ABNORMAL HIGH (ref <150)

## 2023-09-28 ENCOUNTER — Telehealth: Payer: Self-pay | Admitting: *Deleted

## 2023-09-28 NOTE — Telephone Encounter (Signed)
LVM for patient to call back so that I could give him his lab results.

## 2023-09-29 ENCOUNTER — Encounter (HOSPITAL_BASED_OUTPATIENT_CLINIC_OR_DEPARTMENT_OTHER): Payer: BC Managed Care – PPO | Admitting: General Surgery

## 2023-09-29 DIAGNOSIS — E11622 Type 2 diabetes mellitus with other skin ulcer: Secondary | ICD-10-CM | POA: Diagnosis not present

## 2023-09-29 DIAGNOSIS — E1151 Type 2 diabetes mellitus with diabetic peripheral angiopathy without gangrene: Secondary | ICD-10-CM | POA: Diagnosis not present

## 2023-09-29 DIAGNOSIS — G4733 Obstructive sleep apnea (adult) (pediatric): Secondary | ICD-10-CM | POA: Diagnosis not present

## 2023-09-29 DIAGNOSIS — I10 Essential (primary) hypertension: Secondary | ICD-10-CM | POA: Diagnosis not present

## 2023-09-29 DIAGNOSIS — L97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 with unspecified severity: Secondary | ICD-10-CM | POA: Diagnosis not present

## 2023-09-30 ENCOUNTER — Ambulatory Visit: Payer: BC Managed Care – PPO | Admitting: Psychology

## 2023-09-30 DIAGNOSIS — F4323 Adjustment disorder with mixed anxiety and depressed mood: Secondary | ICD-10-CM

## 2023-09-30 NOTE — Progress Notes (Signed)
[facility] Counselor/Therapist Progress Note  Patient ID: [name], MRN: [id number],    Date: 09/30/2023  Time Spent: 45 mins; start time: 0900; end time: 0945  Treatment Type: Individual Therapy  Reported Symptoms: Pt presents for session via Caregility video.  Pt grants consent for session, stating he is in his home with no one else present; pt also shares that he understands the limitations of virtual session.  I shared with pt that I am in my office with no one else here either.  Mental Status Exam: Appearance:  Casual     Behavior: Appropriate  Motor: Normal  Speech/Language:  Clear and Coherent  Affect: Appropriate  Mood: normal  Thought process: normal  Thought content:   WNL  Sensory/Perceptual disturbances:   WNL  Orientation: oriented to person, place, and time/date  Attention: Good  Concentration: Good  Memory: WNL  Fund of knowledge:  Good  Insight:   Good  Judgment:  Good  Impulse Control: Good   Risk Assessment: Danger to Self:  No Self-injurious Behavior: No Danger to Others: No Duty to Warn:no Physical Aggression / Violence:No  Access to Firearms a concern: No  Gang Involvement:No   Subjective: Pt shares that, "I am getting settled back in to working from home and that is going well.  Pt shares that those working from home have to work an hour of OT per week; pt has to work 4 hours of OT this month when he has only been working from home for 2 wks in Nov; this seems unfair to pt but it beats going to the office for his shifts.  Pt shares that his oldest son is still working at [employer]; he is working 3rd shift and his biggest struggle is getting up at midnight to go to work.  Pt shares that he and his family members (son, pt, father, grandmother, etc.) do not sleep like most people.  "We go to sleep early, wake up a couple of times per night, get up and do things, and then goes back to sleep."  Pt's mom has learned that she has a  non-aggressive form of breast cancer and has a treatment plan for it.  Pt shares that his mom is trying to be strong for the family.  Pt's wife is going through these holidays for the first time without her dad who passed away earlier this year; she is sad but is managing OK.  Pt shares that his mom has been baking him a sweet potato pie every Thanksgiving because it is his favorite kind of pie.  Pt has to work from 9 am to 1 pm tomorrow and then will go to his mom's for Thanksgiving meal at 3 pm; his wife and the kids will go to her mom's earlier, while he is working.  He is still working Sun, Mon, Thurs, Fri; all 10 hour days; 10am-7pm.  He does not have to work on Christmas because the day that he falls on is not a regular work day for him.  Pt shares he has an appt with his PCP in about 3 wks for a BP and BS follow up; he has not had any BP issues since our last session; no more BP spikes.  He saw the wound care center yesterday and the wound is still healing; he can see that it is getting better slowly but surely.  Pt shares the most difficult part of Christmas for him is determining what he gets his wife for  Christmas; "she does everything else for everyone else; I don't know what I would do without her."  Pt shares that he does tell his wife how much he appreciates her for all she does for him and the family; he had a good example from his dad and mom.  Encouraged pt to be intentional about engaging in his self care activities and we will meet in 2 weeks for a follow up session.  Interventions: Cognitive Behavioral Therapy  Diagnosis:Adjustment disorder with mixed anxiety and depressed mood  Plan: Treatment Plan Strengths/Abilities:  Intelligent, Intuitive, Willing to participate in therapy Treatment Preferences:  Outpatient Individual Therapy Statement of Needs:  Patient is to use CBT, mindfulness and coping skills to help manage and/or decrease symptoms associated with their diagnosis. Symptoms:   Depressed/Irritable mood, worry, social withdrawal Problems Addressed:  Depressive thoughts, Sadness, Sleep issues, etc. Long Term Goals:  Pt to reduce overall level, frequency, and intensity of the feelings of depression/anxiety as evidenced by decreased irritability, negative self talk, and helpless feelings from 6 to 7 days/week to 0 to 1 days/week, per client report, for at least 3 consecutive months.  Progress: 30% Short Term Goals:  Pt to verbally express understanding of the relationship between feelings of depression/anxiety and their impact on thinking patterns and behaviors.  Pt to verbalize an understanding of the role that distorted thinking plays in creating fears, excessive worry, and ruminations.  Progress: 30% Target Date:  04/24/2024 Frequency:  Bi-weekly Modality:  Cognitive Behavioral Therapy Interventions by Therapist:  Therapist will use CBT, Mindfulness exercises, Coping skills and Referrals, as needed by client. Client has verbally approved this treatment plan.  Karie Kirks, [facility]

## 2023-09-30 NOTE — Progress Notes (Signed)
JEREMAI, RUSSIN [name] ([id number]) [id number] Page 1 of 8 Visit Report for 09/29/2023 Arrival Information Details Patient Name: Date of Service: [name], [place] RD [name] 09/29/2023 8:30 A M Medical Record Number: [id number] Patient Account Number: [id number] Date of Birth/Sex: Treating RN: 02/01/1976 (47 y.o. Marlan Palau Primary Care Nickson Middlesworth: Richarda Blade Other Clinician: Referring Lasean Gorniak: Treating Arvell Pulsifer/Extender: Pleas Patricia in Treatment: 5 Visit Information History Since Last Visit Added or deleted any medications: No Patient Arrived: Ambulatory Any new allergies or adverse reactions: No Arrival Time: 08:38 Had a fall or experienced change in No Accompanied By: self activities of daily living that may affect Transfer Assistance: None risk of falls: Patient Identification Verified: Yes Signs or symptoms of abuse/neglect since last visito No Secondary Verification Process Completed: Yes Hospitalized since last visit: No Patient Requires Transmission-Based Precautions: No Implantable device outside of the clinic excluding No Patient Has Alerts: No cellular tissue based products placed in the center since last visit: Has Dressing in Place as Prescribed: Yes Has Compression in Place as Prescribed: Yes Pain Present Now: No Electronic Signature(s) Signed: 09/29/2023 5:01:06 PM By: Samuella Bruin Entered By: Samuella Bruin on 09/29/2023 08:38:32 -------------------------------------------------------------------------------- Compression Therapy Details Patient Name: Date of Service: [name], [name] 09/29/2023 8:30 A M Medical Record Number: [id number] Patient Account Number: [id number] Date of Birth/Sex: Treating RN: 1976/03/25 (47 y.o. Marlan Palau Primary Care Tashina Credit: Richarda Blade Other Clinician: Referring Tico Crotteau: Treating Yoana Staib/Extender: Jason Nest, Dinah Weeks in Treatment:  5 Compression Therapy Performed for Wound Assessment: Wound #2 Right,Anterior Lower Leg Performed By: Clinician Samuella Bruin, RN Compression Type: Double Layer Post Procedure Diagnosis Same as Pre-procedure Electronic Signature(s) Signed: 09/29/2023 5:01:06 PM By: Samuella Bruin Entered By: Samuella Bruin on 09/29/2023 08:48:18 [name] ([id number]) [id number] Page 2 of 8 -------------------------------------------------------------------------------- Encounter Discharge Information Details Patient Name: Date of Service: [name], [place] RD [name] 09/29/2023 8:30 A M Medical Record Number: [id number] Patient Account Number: [id number] Date of Birth/Sex: Treating RN: 14-Jun-1976 (47 y.o. Marlan Palau Primary Care Micha Dosanjh: Richarda Blade Other Clinician: Referring Areebah Meinders: Treating Graylee Arutyunyan/Extender: Duanne Guess [name], Dinah Weeks in Treatment: 5 Encounter Discharge Information Items Post Procedure Vitals Discharge Condition: Stable Temperature (F): 97.4 Ambulatory Status: Ambulatory Pulse (bpm): 93 Discharge Destination: Home Respiratory Rate (breaths/min): 18 Transportation: Private Auto Blood Pressure (mmHg): 143/92 Accompanied By: self Schedule Follow-up Appointment: Yes Clinical Summary of Care: Patient Declined Electronic Signature(s) Signed: 09/29/2023 5:01:06 PM By: Samuella Bruin Entered By: Samuella Bruin on 09/29/2023 08:59:45 -------------------------------------------------------------------------------- Lower Extremity Assessment Details Patient Name: Date of Service: [name], [name] 09/29/2023 8:30 A M Medical Record Number: [id number] Patient Account Number: [id number] Date of Birth/Sex: Treating RN: 08-Feb-1976 (47 y.o. Marlan Palau Primary Care Shekita Boyden: Richarda Blade Other Clinician: Referring Danthony Kendrix: Treating Skylur Fuston/Extender: Duanne Guess [name], Dinah Weeks in Treatment:  5 Edema Assessment Assessed: [Left: No] [Right: No] [Left: Edema] [Right: :] Calf Left: Right: Point of Measurement: From Medial Instep 41.5 cm Ankle Left: Right: Point of Measurement: From Medial Instep 24.4 cm Vascular Assessment Pulses: Dorsalis Pedis Palpable: [Right:Yes] Extremity colors, hair growth, and conditions: Extremity Color: [Right:Hyperpigmented] Hair Growth on Extremity: [Right:No] Temperature of Extremity: [Right:Warm] Capillary Refill: [Right:< 3 seconds] Dependent Rubor: [Right:No No] Electronic Signature(s) Signed: 09/29/2023 5:01:06 PM By: Samuella Bruin Entered By: Samuella Bruin on 09/29/2023 08:42:47 [name] ([id number]) [id number] Page 3 of 8 -------------------------------------------------------------------------------- Multi Wound Chart Details Patient Name: Date of Service: [name], [place] RD [name] 09/29/2023 8:30 A M Medical  Record Number: [id number] Patient Account Number: [id number] Date of Birth/Sex: Treating RN: 1976-01-23 (47 y.o. M) Primary Care Ronnell Clinger: Richarda Blade Other Clinician: Referring Valincia Touch: Treating Pierre Cumpton/Extender: Jason Nest, Dinah Weeks in Treatment: 5 Vital Signs Height(in): 69 Pulse(bpm): 93 Weight(lbs): 330 Blood Pressure(mmHg): 143/92 Body Mass Index(BMI): 48.7 Temperature(F): 97.4 Respiratory Rate(breaths/min): 18 [2:Photos:] [N/A:N/A] Right, Anterior Lower Leg N/A N/A Wound Location: Bump N/A N/A Wounding Event: Diabetic Wound/Ulcer of the Lower N/A N/A Primary Etiology: Extremity Sleep Apnea, Hypertension, Type II N/A N/A Comorbid History: Diabetes 07/18/2023 N/A N/A Date Acquired: 5 N/A N/A Weeks of Treatment: Open N/A N/A Wound Status: No N/A N/A Wound Recurrence: 3.5x3x0.1 N/A N/A Measurements L x W x D (cm) 8.247 N/A N/A A (cm) : rea 0.825 N/A N/A Volume (cm) : 44.40% N/A N/A % Reduction in A rea: 44.40% N/A N/A % Reduction in  Volume: Grade 2 N/A N/A Classification: Medium N/A N/A Exudate A mount: Serosanguineous N/A N/A Exudate Type: red, brown N/A N/A Exudate Color: Distinct, outline attached N/A N/A Wound Margin: Large (67-100%) N/A N/A Granulation A mount: Red N/A N/A Granulation Quality: Small (1-33%) N/A N/A Necrotic A mount: Fat Layer (Subcutaneous Tissue): Yes N/A N/A Exposed Structures: Fascia: No Tendon: No Muscle: No Joint: No Bone: No Medium (34-66%) N/A N/A Epithelialization: Debridement - Excisional N/A N/A Debridement: Pre-procedure Verification/Time Out 08:49 N/A N/A Taken: Lidocaine 4% Topical Solution N/A N/A Pain Control: Subcutaneous, Slough N/A N/A Tissue Debrided: Skin/Subcutaneous Tissue N/A N/A Level: 8.24 N/A N/A Debridement A (sq cm): rea Curette N/A N/A Instrument: Minimum N/A N/A Bleeding: Pressure N/A N/A Hemostasis A chieved: Procedure was tolerated well N/A N/A Debridement Treatment Response: 3.5x3x0.1 N/A N/A Post Debridement Measurements L x W x D (cm) 0.825 N/A N/A Post Debridement Volume: (cm) Scarring: Yes N/A N/A Periwound Skin Texture: No Abnormalities Noted N/A N/A Periwound Skin Moisture: Hemosiderin Staining: Yes N/A N/A Periwound Skin ColorQUINCY, [name] ([id number]) [id number] Page 4 of 8 No Abnormality N/A N/A Temperature: Compression Therapy N/A N/A Procedures Performed: Debridement Treatment Notes Wound #2 (Lower Leg) Wound Laterality: Right, Anterior Cleanser Soap and Water Discharge Instruction: May shower and wash wound with dial antibacterial soap and water prior to dressing change. Vashe 5.8 (oz) Discharge Instruction: Cleanse the wound with Vashe prior to applying a clean dressing using gauze sponges, not tissue or cotton balls. Peri-Wound Care Sween Lotion (Moisturizing lotion) Discharge Instruction: Apply moisturizing lotion as directed Topical Primary Dressing Hydrofera Blue Ready  Transfer Foam, 4x5 (in/in) Discharge Instruction: Apply to wound bed as instructed Secondary Dressing ABD Pad, 8x10 Discharge Instruction: Apply over primary dressing as directed. Secured With Compression Wrap Urgo K2 Lite, (equivalent to a 3 layer) two layer compression system, regular Discharge Instruction: Apply Urgo K2 Lite as directed (alternative to 3 layer compression). Compression Stockings Add-Ons Electronic Signature(s) Signed: 09/29/2023 9:56:23 AM By: Duanne Guess MD FACS Entered By: Duanne Guess on 09/29/2023 09:56:23 -------------------------------------------------------------------------------- Multi-Disciplinary Care Plan Details Patient Name: Date of Service: [name], [name] 09/29/2023 8:30 A M Medical Record Number: [id number] Patient Account Number: [id number] Date of Birth/Sex: Treating RN: Aug 03, 1976 (47 y.o. Marlan Palau Primary Care Mauricia Mertens: Richarda Blade Other Clinician: Referring Gicela Schwarting: Treating Kyeisha Janowicz/Extender: Duanne Guess [name], Dinah Weeks in Treatment: 5 Active Inactive Wound/Skin Impairment Nursing Diagnoses: Knowledge deficit related to ulceration/compromised skin integrity Goals: Patient/caregiver will verbalize understanding of skin care regimen Date Initiated: 08/20/2023 Target Resolution Date: 10/31/2023 Goal Status: Active Interventions: Assess patient/caregiver ability to obtain necessary supplies [name], [name] ([id number])  [id number] Page 5 of 8 Assess patient/caregiver ability to perform ulcer/skin care regimen upon admission and as needed Assess ulceration(s) every visit Provide education on ulcer and skin care Screen for HBO Treatment Activities: Skin care regimen initiated : 08/20/2023 Topical wound management initiated : 08/20/2023 Notes: Electronic Signature(s) Signed: 09/29/2023 5:01:06 PM By: Samuella Bruin Entered By: Samuella Bruin on 09/29/2023  08:58:25 -------------------------------------------------------------------------------- Pain Assessment Details Patient Name: Date of Service: [name], [name] 09/29/2023 8:30 A M Medical Record Number: [id number] Patient Account Number: [id number] Date of Birth/Sex: Treating RN: [birth year] (47 y.o. Marlan Palau Primary Care Aniaya Bacha: Richarda Blade Other Clinician: Referring Alexsis Kathman: Treating Dao Mearns/Extender: Jason Nest, Dinah Weeks in Treatment: 5 Active Problems Location of Pain Severity and Description of Pain Patient Has Paino No Site Locations Rate the pain. Current Pain Level: 0 Pain Management and Medication Current Pain Management: Electronic Signature(s) Signed: 09/29/2023 5:01:06 PM By: Samuella Bruin Entered By: Samuella Bruin on 09/29/2023 08:38:39 -------------------------------------------------------------------------------- Patient/Caregiver Education Details Patient Name: Date of Service: [name] RD [name] 11/26/2024andnbsp8:30 A [name], [name] ([id number]) [id number] Page 6 of 8 Medical Record Number: [id number] Patient Account Number: [id number] Date of Birth/Gender: Treating RN: [birth year] (47 y.o. Marlan Palau Primary Care Physician: Richarda Blade Other Clinician: Referring Physician: Treating Physician/Extender: Pleas Patricia in Treatment: 5 Education Assessment Education Provided To: Patient Education Topics Provided Wound Debridement: Methods: Explain/Verbal Responses: State content correctly Electronic Signature(s) Signed: 09/29/2023 5:01:06 PM By: Gelene Mink By: Samuella Bruin on 09/29/2023 08:58:41 -------------------------------------------------------------------------------- Wound Assessment Details Patient Name: Date of Service: [name], [name] 09/29/2023 8:30 A M Medical Record Number: [id number] Patient Account Number:  [id number] Date of Birth/Sex: Treating RN: May 27, 1976 (47 y.o. Marlan Palau Primary Care Sid Greener: Richarda Blade Other Clinician: Referring Tanieka Pownall: Treating Macario Shear/Extender: Duanne Guess [name], Dinah Weeks in Treatment: 5 Wound Status Wound Number: 2 Primary Etiology: Diabetic Wound/Ulcer of the Lower Extremity Wound Location: Right, Anterior Lower Leg Wound Status: Open Wounding Event: Bump Comorbid History: Sleep Apnea, Hypertension, Type II Diabetes Date Acquired: 07/18/2023 Weeks Of Treatment: 5 Clustered Wound: No Photos Wound Measurements Length: (cm) 3.5 Width: (cm) 3 Depth: (cm) 0.1 Area: (cm) 8.247 Volume: (cm) 0.825 % Reduction in Area: 44.4% % Reduction in Volume: 44.4% Epithelialization: Medium (34-66%) Tunneling: No Undermining: No Wound Description Classification: Grade 2 Wound Margin: Distinct, outline attached Exudate Amount: Medium Exudate Type: Serosanguineous [name], [name] ([id number]) Exudate Color: red, brown Foul Odor After Cleansing: No Slough/Fibrino Yes [id number] Page 7 of 8 Wound Bed Granulation Amount: Large (67-100%) Exposed Structure Granulation Quality: Red Fascia Exposed: No Necrotic Amount: Small (1-33%) Fat Layer (Subcutaneous Tissue) Exposed: Yes Necrotic Quality: Adherent Slough Tendon Exposed: No Muscle Exposed: No Joint Exposed: No Bone Exposed: No Periwound Skin Texture Texture Color No Abnormalities Noted: Yes No Abnormalities Noted: No Hemosiderin Staining: Yes Moisture No Abnormalities Noted: Yes Temperature / Pain Temperature: No Abnormality Treatment Notes Wound #2 (Lower Leg) Wound Laterality: Right, Anterior Cleanser Soap and Water Discharge Instruction: May shower and wash wound with dial antibacterial soap and water prior to dressing change. Vashe 5.8 (oz) Discharge Instruction: Cleanse the wound with Vashe prior to applying a clean dressing using gauze sponges,  not tissue or cotton balls. Peri-Wound Care Sween Lotion (Moisturizing lotion) Discharge Instruction: Apply moisturizing lotion as directed Topical Primary Dressing Hydrofera Blue Ready Transfer Foam, 4x5 (in/in) Discharge Instruction: Apply to wound bed as instructed Secondary Dressing ABD Pad, 8x10 Discharge Instruction: Apply over primary dressing as  directed. Secured With Compression Wrap Urgo K2 Lite, (equivalent to a 3 layer) two layer compression system, regular Discharge Instruction: Apply Urgo K2 Lite as directed (alternative to 3 layer compression). Compression Stockings Add-Ons Electronic Signature(s) Signed: 09/29/2023 5:01:06 PM By: Samuella Bruin Entered By: Samuella Bruin on 09/29/2023 08:44:36 -------------------------------------------------------------------------------- Vitals Details Patient Name: Date of Service: [name], [name] 09/29/2023 8:30 A M Medical Record Number: [id number] Patient Account Number: [id number] Date of Birth/Sex: Treating RN: 09/17/1976 (47 y.o. Marlan Palau Primary Care Nykeem Citro: Richarda Blade Other Clinician: Referring Seanne Chirico: Treating Kemet Nijjar/Extender: Jason Nest, Dinah Weeks in Treatment: 5 Vital Signs [name], [name] ([id number]) [id number] Page 8 of 8 Time Taken: 08:38 Temperature (F): 97.4 Height (in): 69 Pulse (bpm): 93 Weight (lbs): 330 Respiratory Rate (breaths/min): 18 Body Mass Index (BMI): 48.7 Blood Pressure (mmHg): 143/92 Reference Range: 80 - 120 mg / dl Electronic Signature(s) Signed: 09/29/2023 5:01:06 PM By: Samuella Bruin Entered By: Samuella Bruin on 09/29/2023 08:38:54

## 2023-09-30 NOTE — Progress Notes (Signed)
JASMON, SETTLE [name] ([id number]) [id number] Page 1 of 10 Visit Report for 09/29/2023 Chief Complaint Document Details Patient Name: Date of Service: [name] RD [name] 09/29/2023 8:30 A M Medical Record Number: [id number] Patient Account Number: [id number] Date of Birth/Sex: Treating RN: 04-Oct-1976 (47 y.o. M) Primary Care Provider: Richarda Blade Other Clinician: Referring Provider: Treating Provider/Extender: Jason Nest, Dinah Weeks in Treatment: 5 Information Obtained from: Patient Chief Complaint 11/23/2018; patient is here for review of an ulcer on the left posterior calf 10/06/2019; patient is here for review of the wound area from the previous visit which is on the left posterior lateral calf 08/20/2023: here for new wound on RLE anterior Electronic Signature(s) Signed: 09/29/2023 9:58:11 AM By: Duanne Guess MD FACS Entered By: Duanne Guess on 09/29/2023 06:58:11 -------------------------------------------------------------------------------- Debridement Details Patient Name: Date of Service: [name], [name] 09/29/2023 8:30 A M Medical Record Number: [id number] Patient Account Number: [id number] Date of Birth/Sex: Treating RN: 03-13-1976 (47 y.o. Marlan Palau Primary Care Provider: Richarda Blade Other Clinician: Referring Provider: Treating Provider/Extender: Pleas Patricia in Treatment: 5 Debridement Performed for Assessment: Wound #2 Right,Anterior Lower Leg Performed By: Physician Duanne Guess, MD The following information was scribed by: Samuella Bruin The information was scribed for: Duanne Guess Debridement Type: Debridement Severity of Tissue Pre Debridement: Fat layer exposed Level of Consciousness (Pre-procedure): Awake and Alert Pre-procedure Verification/Time Out Yes - 08:49 Taken: Start Time: 08:49 Pain Control: Lidocaine 4% Topical Solution Percent of Wound Bed Debrided:  100% T Area Debrided (cm): otal 8.24 Tissue and other material debrided: Slough, Subcutaneous, Skin: Epidermis, Slough Level: Skin/Subcutaneous Tissue Debridement Description: Excisional Instrument: Curette Bleeding: Minimum Hemostasis Achieved: Pressure Response to Treatment: Procedure was tolerated well Level of Consciousness (Post- Awake and Alert procedure): Post Debridement Measurements of Total Wound Length: (cm) 3.5 Width: (cm) 3 Depth: (cm) 0.1 Volume: (cm) 0.825 [name], [name] ([id number]) [id number] Page 2 of 10 Character of Wound/Ulcer Post Debridement: Improved Severity of Tissue Post Debridement: Fat layer exposed Post Procedure Diagnosis Same as Pre-procedure Electronic Signature(s) Signed: 09/29/2023 10:29:44 AM By: Duanne Guess MD FACS Signed: 09/29/2023 5:01:06 PM By: Samuella Bruin Entered By: Samuella Bruin on 09/29/2023 05:49:44 -------------------------------------------------------------------------------- HPI Details Patient Name: Date of Service: [name], [name] 09/29/2023 8:30 A M Medical Record Number: [id number] Patient Account Number: [id number] Date of Birth/Sex: Treating RN: 08-04-1976 (47 y.o. M) Primary Care Provider: Richarda Blade Other Clinician: Referring Provider: Treating Provider/Extender: Jason Nest, Dinah Weeks in Treatment: 5 History of Present Illness HPI Description: Admission 11/23/2018 This is a 47 year old man with type 2 diabetes. He tells [place] that about a week before Thanksgiving he noticed a small painful area on the back of his left mid calf. He saw a physician at urgent care on 10/10/2018 noted to have an open wound on the left calf. He was given Bactroban and Keflex as CandS was apparently negative. He saw his own primary doctor on 612/31/19 and was given Silvadene and doxycycline. He has been using Silvadene since then. He is saw his primary physician again on 1/10 was  offered an Radio broadcast assistant but I do not think that he actually use this. The area in question is on the left posterior calf. There is some question that this was a insect bite and when he first presented but he certainly did not notice his any particular bite injury at that time. This did actually apparently at one point look as though it was progressing towards closure  although it then it opened again. He has been using Silvadene for the past 3 weeks Past medical history; obstructive sleep apnea, Achilles tendinitis, hypertension, type 2 diabetes, cholecystectomy, fatty liver, ABI in this clinic was 1.38 on the left 11/30/2018; the area is essentially unchanged in size although the surface of the wound looks better. He has surrounding inflammation around the wound the exact etiology of this is unclear. 12/07/2018; the area has come down in diameter. Surface requires debridement. We have been using silver collagen. Is the wound seems to have come down in size I have elected not to biopsy this. Surrounding inflammation is also less 2/11; the area continues to come down in size and generally looks healthy. We have been using silver collagen under compression surrounding inflammation is less there is no tenderness 2/18; the area continues to contract in size and looks healthy. We have been using silver collagen under compression 2/25; no major change in dimension this week. However the wound bed continues to look healthy and there appears to be less inflammation around the wound 3/3; the wound measures slightly smaller. Healthy looking surface. Less inflammation 3/10; wound is closed. Healthy looking surface. Still some discoloration around the wound area but absolutely no tenderness. The cause of this wound was never really determined. However it progressed nicely towards healing READMISSION 10/06/2019 This is a patient I dealt with with an unusual wound on the left posterior lateral calf in the early part of  this year. I was never really completely sure I understood what this was in terms of causation. However wde use silver collagen under compression and the area responded nicely and we are able to discharge him after roughly 6 weeks in the clinic. He did not clearly have arterial or venous issues. I had some thoughts about biopsying this area however acid continued to get better I did not go through with this. He states that everything is been fine up until the last few weeks. His wife noted a scab with perhaps some purulent drainage. They called primary care and was prescribed doxy which she is finished and everything seems to have settled down there was no open area however the patient wanted [place] to look at this. READMISSION 08/20/2023 ***ABIs L: 1.04; R: 1.11*** This is a now 47 year old type II diabetic (last hemoglobin A1c 6.7%) who returns with a new wound on his right lower extremity, anterior tibial surface. He says it began exactly the way he is previous wound that led to wound care center admission started. He was mowing the lawn and developed a bump on his leg. It subsequently broke down resulting in a large ulcer. Patient was seen in the emergency department on 04 August 2023. He was given a prescription for Keflex and Bactrim. He was referred to the wound care center for further evaluation and management. 08/28/2023: The wound measured a little bit larger today and has a nonviable surface. There is still an odor coming from the wound. Edema control is good. The culture that I took last week grew out a polymicrobial population but all organisms were at fairly low levels. 09/02/2023: The wound size was stable today, but it is markedly cleaner with just a little bit of slough and eschar around the edges. The odor has abated. He has had quite a bit of drainage. Edema control is excellent. 09/15/2023: The wound was a little bit smaller today and the surface is incredibly clean without any  slough or eschar buildup. No odor or significant  drainage. Edema control is excellent. [name], [name] ([id number]) [id number] Page 3 of 10 09/22/2023: The wound is smaller with healthy and robust granulation tissue filling in. Edema control remains excellent. 09/29/2023: The wound continues to contract. There is very minimal slough accumulation and the granulation tissue is healthy. Good edema control. Electronic Signature(s) Signed: 09/29/2023 9:58:40 AM By: Duanne Guess MD FACS Entered By: Duanne Guess on 09/29/2023 06:58:39 -------------------------------------------------------------------------------- Physical Exam Details Patient Name: Date of Service: [name], [name] 09/29/2023 8:30 A M Medical Record Number: [id number] Patient Account Number: [id number] Date of Birth/Sex: Treating RN: [birth year] (47 y.o. M) Primary Care Provider: Richarda Blade Other Clinician: Referring Provider: Treating Provider/Extender: Jason Nest, Dinah Weeks in Treatment: 5 Constitutional Slightly hypertensive. . . . no acute distress. Respiratory Normal work of breathing on room air.. Notes 09/29/2023: The wound continues to contract. There is very minimal slough accumulation and the granulation tissue is healthy. Good edema control. Electronic Signature(s) Signed: 09/29/2023 9:59:08 AM By: Duanne Guess MD FACS Entered By: Duanne Guess on 09/29/2023 06:59:08 -------------------------------------------------------------------------------- Physician Orders Details Patient Name: Date of Service: [name], [name] 09/29/2023 8:30 A M Medical Record Number: [id number] Patient Account Number: [id number] Date of Birth/Sex: Treating RN: 1976-06-22 (47 y.o. Marlan Palau Primary Care Provider: Richarda Blade Other Clinician: Referring Provider: Treating Provider/Extender: Pleas Patricia in Treatment: 5 The following  information was scribed by: Samuella Bruin The information was scribed for: Duanne Guess Verbal / Phone Orders: No Diagnosis Coding Follow-up Appointments ppointment in 1 week. - Dr. Lady Gary Room 2 Return A Anesthetic (In clinic) Topical Lidocaine 4% applied to wound bed Bathing/ Shower/ Hygiene May shower with protection but do not get wound dressing(s) wet. Protect dressing(s) with water repellant cover (for example, large plastic bag) or a cast cover and may then take shower. Edema Control - Orders / Instructions Elevate legs to the level of the heart or above for 30 minutes daily and/or when sitting for 3-4 times a day throughout the day. A void standing for long periods of time. If compression wraps slide down please call wound center and speak with a nurse. BIAGIO, MOROCHO [name] ([id number]) [id number] Page 4 of 10 Wound Treatment Wound #2 - Lower Leg Wound Laterality: Right, Anterior Cleanser: Soap and Water 1 x Per Week/30 Days Discharge Instructions: May shower and wash wound with dial antibacterial soap and water prior to dressing change. Cleanser: Vashe 5.8 (oz) 1 x Per Week/30 Days Discharge Instructions: Cleanse the wound with Vashe prior to applying a clean dressing using gauze sponges, not tissue or cotton balls. Peri-Wound Care: Sween Lotion (Moisturizing lotion) 1 x Per Week/30 Days Discharge Instructions: Apply moisturizing lotion as directed Prim Dressing: Hydrofera Blue Ready Transfer Foam, 4x5 (in/in) 1 x Per Week/30 Days ary Discharge Instructions: Apply to wound bed as instructed Secondary Dressing: ABD Pad, 8x10 1 x Per Week/30 Days Discharge Instructions: Apply over primary dressing as directed. Compression Wrap: Urgo K2 Lite, (equivalent to a 3 layer) two layer compression system, regular 1 x Per Week/30 Days Discharge Instructions: Apply Urgo K2 Lite as directed (alternative to 3 layer compression). Patient Medications llergies:  No Known Allergies A Notifications Medication Indication Start End 09/29/2023 lidocaine DOSE topical 4 % cream - cream topical Electronic Signature(s) Signed: 09/29/2023 10:29:44 AM By: Duanne Guess MD FACS Entered By: Duanne Guess on 09/29/2023 06:59:24 -------------------------------------------------------------------------------- Problem List Details Patient Name: Date of Service: [name], [name] 09/29/2023 8:30 A M Medical Record Number:  [id number] Patient Account Number: [id number] Date of Birth/Sex: Treating RN: [birth year] (47 y.o. M) Primary Care Provider: Richarda Blade Other Clinician: Referring Provider: Treating Provider/Extender: Jason Nest, Dinah Weeks in Treatment: 5 Active Problems ICD-10 Encounter Code Description Active Date MDM Diagnosis L97.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 with unspecified 08/20/2023 No Yes severity E11.622 Type 2 diabetes mellitus with other skin ulcer 08/20/2023 No Yes E66.01 Morbid (severe) obesity due to excess calories 08/20/2023 No Yes Inactive Problems Resolved Problems WHITMAN, LAPLUME [name] ([id number]) [id number] Page 5 of 10 Electronic Signature(s) Signed: 09/29/2023 9:56:09 AM By: Duanne Guess MD FACS Entered By: Duanne Guess on 09/29/2023 06:56:09 -------------------------------------------------------------------------------- Progress Note Details Patient Name: Date of Service: [name], [name] 09/29/2023 8:30 A M Medical Record Number: [id number] Patient Account Number: [id number] Date of Birth/Sex: Treating RN: 05-19-1976 (47 y.o. M) Primary Care Provider: Richarda Blade Other Clinician: Referring Provider: Treating Provider/Extender: Jason Nest, Dinah Weeks in Treatment: 5 Subjective Chief Complaint Information obtained from Patient 11/23/2018; patient is here for review of an ulcer on the left posterior calf 10/06/2019;  patient is here for review of the wound area from the previous visit which is on the left posterior lateral calf 08/20/2023: here for new wound on RLE anterior History of Present Illness (HPI) Admission 11/23/2018 This is a 47 year old man with type 2 diabetes. He tells [place] that about a week before Thanksgiving he noticed a small painful area on the back of his left mid calf. He saw a physician at urgent care on 10/10/2018 noted to have an open wound on the left calf. He was given Bactroban and Keflex as CandS was apparently negative. He saw his own primary doctor on 612/31/19 and was given Silvadene and doxycycline. He has been using Silvadene since then. He is saw his primary physician again on 1/10 was offered an Radio broadcast assistant but I do not think that he actually use this. The area in question is on the left posterior calf. There is some question that this was a insect bite and when he first presented but he certainly did not notice his any particular bite injury at that time. This did actually apparently at one point look as though it was progressing towards closure although it then it opened again. He has been using Silvadene for the past 3 weeks Past medical history; obstructive sleep apnea, Achilles tendinitis, hypertension, type 2 diabetes, cholecystectomy, fatty liver, ABI in this clinic was 1.38 on the left 11/30/2018; the area is essentially unchanged in size although the surface of the wound looks better. He has surrounding inflammation around the wound the exact etiology of this is unclear. 12/07/2018; the area has come down in diameter. Surface requires debridement. We have been using silver collagen. Is the wound seems to have come down in size I have elected not to biopsy this. Surrounding inflammation is also less 2/11; the area continues to come down in size and generally looks healthy. We have been using silver collagen under compression surrounding inflammation is less there is no  tenderness 2/18; the area continues to contract in size and looks healthy. We have been using silver collagen under compression 2/25; no major change in dimension this week. However the wound bed continues to look healthy and there appears to be less inflammation around the wound 3/3; the wound measures slightly smaller. Healthy looking surface. Less inflammation 3/10; wound is closed. Healthy looking surface. Still some discoloration around the wound area but absolutely no  tenderness. The cause of this wound was never really determined. However it progressed nicely towards healing READMISSION 10/06/2019 This is a patient I dealt with with an unusual wound on the left posterior lateral calf in the early part of this year. I was never really completely sure I understood what this was in terms of causation. However wde use silver collagen under compression and the area responded nicely and we are able to discharge him after roughly 6 weeks in the clinic. He did not clearly have arterial or venous issues. I had some thoughts about biopsying this area however acid continued to get better I did not go through with this. He states that everything is been fine up until the last few weeks. His wife noted a scab with perhaps some purulent drainage. They called primary care and was prescribed doxy which she is finished and everything seems to have settled down there was no open area however the patient wanted [place] to look at this. READMISSION 08/20/2023 ***ABIs L: 1.04; R: 1.11*** This is a now 47 year old type II diabetic (last hemoglobin A1c 6.7%) who returns with a new wound on his right lower extremity, anterior tibial surface. He says it began exactly the way he is previous wound that led to wound care center admission started. He was mowing the lawn and developed a bump on his leg. It subsequently broke down resulting in a large ulcer. Patient was seen in the emergency department on 04 August 2023. He  was given a prescription for Keflex and Bactrim. He was referred to the wound care center for further evaluation and management. 08/28/2023: The wound measured a little bit larger today and has a nonviable surface. There is still an odor coming from the wound. Edema control is good. The culture that I took last week grew out a polymicrobial population but all organisms were at fairly low levels. 09/02/2023: The wound size was stable today, but it is markedly cleaner with just a little bit of slough and eschar around the edges. The odor has abated. He has had quite a bit of drainage. Edema control is excellent. 09/15/2023: The wound was a little bit smaller today and the surface is incredibly clean without any slough or eschar buildup. No odor or significant drainage. Edema control is excellent. 09/22/2023: The wound is smaller with healthy and robust granulation tissue filling in. Edema control remains excellent. 09/29/2023: The wound continues to contract. There is very minimal slough accumulation and the granulation tissue is healthy. Good edema control. [name], [name] ([id number]) [id number] Page 6 of 10 Patient History Information obtained from Patient, Chart. Family History Cancer - Maternal Grandparents, Diabetes - Paternal Grandparents, Hypertension - Mother, Kidney Disease - Father, Stroke - Paternal Grandparents, No family history of Heart Disease, Hereditary Spherocytosis, Lung Disease, Seizures, Thyroid Problems, Tuberculosis. Social History Never smoker, Marital Status - Married, Alcohol Use - Moderate, Drug Use - Prior History - [employer] in college, Caffeine Use - Daily - coffee. Medical History Eyes Denies history of Cataracts, Glaucoma, Optic Neuritis Ear/Nose/Mouth/Throat Denies history of Chronic sinus problems/congestion, Middle ear problems Hematologic/Lymphatic Denies history of Anemia, Hemophilia, Human Immunodeficiency Virus, Lymphedema, Sickle  Cell Disease Respiratory Patient has history of Sleep Apnea - CPAP Denies history of Aspiration, Asthma, Chronic Obstructive Pulmonary Disease (COPD), Pneumothorax, Tuberculosis Cardiovascular Patient has history of Hypertension - [name]/p has been running high due to running out of medication Denies history of Angina, Arrhythmia, Congestive Heart Failure, Coronary Artery Disease, Deep Vein Thrombosis, Hypotension, Myocardial Infarction, Peripheral  Arterial Disease, Peripheral Venous Disease, Phlebitis, Vasculitis Gastrointestinal Denies history of Cirrhosis , Colitis, Crohns, Hepatitis A, Hepatitis [name], Hepatitis C Endocrine Patient has history of Type II Diabetes Denies history of Type I Diabetes Genitourinary Denies history of End Stage Renal Disease Immunological Denies history of Lupus Erythematosus, Raynauds, Scleroderma Integumentary (Skin) Denies history of History of Burn Musculoskeletal Denies history of Gout, Rheumatoid Arthritis, Osteoarthritis, Osteomyelitis Neurologic Denies history of Dementia, Neuropathy, Quadriplegia, Paraplegia, Seizure Disorder Oncologic Denies history of Received Chemotherapy, Received Radiation Psychiatric Denies history of Anorexia/bulimia, Confinement Anxiety Hospitalization/Surgery History - cholecystectomy. - right ankle sugery for club foot. Medical A Surgical History Notes nd Constitutional Symptoms (General Health) morbid obesity, migraines Ear/Nose/Mouth/Throat nasal congestion Cardiovascular hyperlipidemia Endocrine hypogonadism, low testosterone Genitourinary CKD Integumentary (Skin) hand dermatitis Psychiatric anxiety, depression, ADHD Objective Constitutional Slightly hypertensive. no acute distress. Vitals Time Taken: 8:38 AM, Height: 69 in, Weight: 330 lbs, BMI: 48.7, Temperature: 97.4 F, Pulse: 93 bpm, Respiratory Rate: 18 breaths/min, Blood Pressure: 143/92 mmHg. Respiratory Normal work of breathing on room  air.. General Notes: 09/29/2023: The wound continues to contract. There is very minimal slough accumulation and the granulation tissue is healthy. Good edema control. Integumentary (Hair, Skin) [name], [name] ([id number]) [id number] Page 7 of 10 Wound #2 status is Open. Original cause of wound was Bump. The date acquired was: 07/18/2023. The wound has been in treatment 5 weeks. The wound is located on the Right,Anterior Lower Leg. The wound measures 3.5cm length x 3cm width x 0.1cm depth; 8.247cm^2 area and 0.825cm^3 volume. There is Fat Layer (Subcutaneous Tissue) exposed. There is no tunneling or undermining noted. There is a medium amount of serosanguineous drainage noted. The wound margin is distinct with the outline attached to the wound base. There is large (67-100%) red granulation within the wound bed. There is a small (1-33%) amount of necrotic tissue within the wound bed including Adherent Slough. The periwound skin appearance had no abnormalities noted for texture. The periwound skin appearance had no abnormalities noted for moisture. The periwound skin appearance exhibited: Hemosiderin Staining. Periwound temperature was noted as No Abnormality. Assessment Active Problems ICD-10 Non-pressure chronic ulcer of other part of right lower leg with unspecified severity Type 2 diabetes mellitus with other skin ulcer Morbid (severe) obesity due to excess calories Procedures Wound #2 Pre-procedure diagnosis of Wound #2 is a Diabetic Wound/Ulcer of the Lower Extremity located on the Right,Anterior Lower Leg .Severity of Tissue Pre Debridement is: Fat layer exposed. There was a Excisional Skin/Subcutaneous Tissue Debridement with a total area of 8.24 sq cm performed by Duanne Guess, MD. With the following instrument(s): Curette Material removed includes Subcutaneous Tissue, Slough, and Skin: Epidermis after achieving pain control using Lidocaine 4% Topical  Solution. No specimens were taken. A time out was conducted at 08:49, prior to the start of the procedure. A Minimum amount of bleeding was controlled with Pressure. The procedure was tolerated well. Post Debridement Measurements: 3.5cm length x 3cm width x 0.1cm depth; 0.825cm^3 volume. Character of Wound/Ulcer Post Debridement is improved. Severity of Tissue Post Debridement is: Fat layer exposed. Post procedure Diagnosis Wound #2: Same as Pre-Procedure Pre-procedure diagnosis of Wound #2 is a Diabetic Wound/Ulcer of the Lower Extremity located on the Right,Anterior Lower Leg . There was a Double Layer Compression Therapy Procedure by [name] Ek, RN. Post procedure Diagnosis Wound #2: Same as Pre-Procedure Plan Follow-up Appointments: Return Appointment in 1 week. - Dr. Lady Gary Room 2 Anesthetic: (In clinic) Topical Lidocaine 4% applied to wound bed Bathing/  Shower/ Hygiene: May shower with protection but do not get wound dressing(s) wet. Protect dressing(s) with water repellant cover (for example, large plastic bag) or a cast cover and may then take shower. Edema Control - Orders / Instructions: Elevate legs to the level of the heart or above for 30 minutes daily and/or when sitting for 3-4 times a day throughout the day. Avoid standing for long periods of time. If compression wraps slide down please call wound center and speak with a nurse. The following medication(s) was prescribed: lidocaine topical 4 % cream cream topical was prescribed at facility WOUND #2: - Lower Leg Wound Laterality: Right, Anterior Cleanser: Soap and Water 1 x Per Week/30 Days Discharge Instructions: May shower and wash wound with dial antibacterial soap and water prior to dressing change. Cleanser: Vashe 5.8 (oz) 1 x Per Week/30 Days Discharge Instructions: Cleanse the wound with Vashe prior to applying a clean dressing using gauze sponges, not tissue or cotton balls. Peri-Wound Care: Sween Lotion  (Moisturizing lotion) 1 x Per Week/30 Days Discharge Instructions: Apply moisturizing lotion as directed Prim Dressing: Hydrofera Blue Ready Transfer Foam, 4x5 (in/in) 1 x Per Week/30 Days ary Discharge Instructions: Apply to wound bed as instructed Secondary Dressing: ABD Pad, 8x10 1 x Per Week/30 Days Discharge Instructions: Apply over primary dressing as directed. Com pression Wrap: Urgo K2 Lite, (equivalent to a 3 layer) two layer compression system, regular 1 x Per Week/30 Days Discharge Instructions: Apply Urgo K2 Lite as directed (alternative to 3 layer compression). 09/29/2023: The wound continues to contract. There is very minimal slough accumulation and the granulation tissue is healthy. Good edema control. I used a curette to debride slough and subcutaneous tissue from the wound. We will continue Hydrofera Blue with Urgo light compression. Follow-up in 1 week for nurse visit. [name], [name] ([id number]) [id number] Page 8 of 10 Electronic Signature(s) Signed: 09/29/2023 10:00:02 AM By: Duanne Guess MD FACS Entered By: Duanne Guess on 09/29/2023 07:00:02 -------------------------------------------------------------------------------- HxROS Details Patient Name: Date of Service: [name], [name] 09/29/2023 8:30 A M Medical Record Number: [id number] Patient Account Number: [id number] Date of Birth/Sex: Treating RN: [birth year] (47 y.o. M) Primary Care Provider: Richarda Blade Other Clinician: Referring Provider: Treating Provider/Extender: Jason Nest, Dinah Weeks in Treatment: 5 Information Obtained From Patient Chart Constitutional Symptoms (General Health) Medical History: Past Medical History Notes: morbid obesity, migraines Eyes Medical History: Negative for: Cataracts; Glaucoma; Optic Neuritis Ear/Nose/Mouth/Throat Medical History: Negative for: Chronic sinus problems/congestion; Middle ear problems Past Medical  History Notes: nasal congestion Hematologic/Lymphatic Medical History: Negative for: Anemia; Hemophilia; Human Immunodeficiency Virus; Lymphedema; Sickle Cell Disease Respiratory Medical History: Positive for: Sleep Apnea - CPAP Negative for: Aspiration; Asthma; Chronic Obstructive Pulmonary Disease (COPD); Pneumothorax; Tuberculosis Cardiovascular Medical History: Positive for: Hypertension - [name]/p has been running high due to running out of medication Negative for: Angina; Arrhythmia; Congestive Heart Failure; Coronary Artery Disease; Deep Vein Thrombosis; Hypotension; Myocardial Infarction; Peripheral Arterial Disease; Peripheral Venous Disease; Phlebitis; Vasculitis Past Medical History Notes: hyperlipidemia Gastrointestinal Medical History: Negative for: Cirrhosis ; Colitis; Crohns; Hepatitis A; Hepatitis [name]; Hepatitis C Endocrine Medical History: Positive for: Type II Diabetes Negative for: Type I Diabetes Past Medical History Notes: hypogonadism, low testosterone Time with diabetes: 1 year Treated with: Oral agents Blood sugar tested every day: No NILTON, FISCHETTI [name] ([id number]) [id number] Page 9 of 10 Genitourinary Medical History: Negative for: End Stage Renal Disease Past Medical History Notes: CKD Immunological Medical History: Negative for: Lupus Erythematosus; Raynauds; Scleroderma Integumentary (Skin)  Medical History: Negative for: History of Burn Past Medical History Notes: hand dermatitis Musculoskeletal Medical History: Negative for: Gout; Rheumatoid Arthritis; Osteoarthritis; Osteomyelitis Neurologic Medical History: Negative for: Dementia; Neuropathy; Quadriplegia; Paraplegia; Seizure Disorder Oncologic Medical History: Negative for: Received Chemotherapy; Received Radiation Psychiatric Medical History: Negative for: Anorexia/bulimia; Confinement Anxiety Past Medical History Notes: anxiety, depression,  ADHD Immunizations Pneumococcal Vaccine: Received Pneumococcal Vaccination: Yes Received Pneumococcal Vaccination On or After 60th Birthday: No Tetanus Vaccine: Last tetanus shot: 05/25/2017 Implantable Devices None Hospitalization / Surgery History Type of Hospitalization/Surgery 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 Family and Social History Cancer: Yes - Maternal Grandparents; Diabetes: Yes - Paternal Grandparents; Heart Disease: No; Hereditary Spherocytosis: No; Hypertension: Yes - Mother; Kidney Disease: Yes - Father; Lung Disease: No; Seizures: No; Stroke: Yes - Paternal Grandparents; Thyroid Problems: No; Tuberculosis: No; Never smoker; Marital Status - Married; Alcohol Use: Moderate; Drug Use: Prior History - [employer] in college; Caffeine Use: Daily - coffee; Financial Concerns: No; Food, Clothing or Shelter Needs: No; Support System Lacking: No; Transportation Concerns: No Electronic Signature(s) Signed: 09/29/2023 10:29:44 AM By: Duanne Guess MD FACS Entered By: Duanne Guess on 09/29/2023 06:58:46 -------------------------------------------------------------------------------- SuperBill Details Patient Name: Date of Service: [name], [name] 09/29/2023 [name] ([id number]) [id number] Page 10 of 10 Medical Record Number: [id number] Patient Account Number: [id number] Date of Birth/Sex: Treating RN: [birth year] (47 y.o. M) Primary Care Provider: Richarda Blade Other Clinician: Referring Provider: Treating Provider/Extender: Jason Nest, Dinah Weeks in Treatment: 5 Diagnosis Coding ICD-10 Codes Code Description [phone] Non-pressure chronic ulcer of other part of right lower leg with unspecified severity E11.622 Type 2 diabetes mellitus with other skin ulcer E66.01 Morbid (severe) obesity due to excess calories Facility Procedures : CPT4 Code: [id number] Description: 11042 - DEB SUBQ TISSUE 20 SQ CM/< ICD-10  Diagnosis Description L97.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 with unspecified se E11.622 Type 2 diabetes mellitus with other skin ulcer Modifier: verity Quantity: 1 Physician Procedures : CPT4 Code Description Modifier [id number] 11042 - WC PHYS SUBQ TISS 20 SQ CM ICD-10 Diagnosis Description L97.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 with unspecified severity E11.622 Type 2 diabetes mellitus with other skin ulcer Quantity: 1 Electronic Signature(s) Signed: 09/29/2023 10:00:19 AM By: Duanne Guess MD FACS Entered By: Duanne Guess on 09/29/2023 07:00:19

## 2023-10-06 ENCOUNTER — Encounter (HOSPITAL_BASED_OUTPATIENT_CLINIC_OR_DEPARTMENT_OTHER): Payer: BC Managed Care – PPO | Attending: Internal Medicine | Admitting: Internal Medicine

## 2023-10-06 ENCOUNTER — Other Ambulatory Visit: Payer: Self-pay

## 2023-10-06 DIAGNOSIS — G4733 Obstructive sleep apnea (adult) (pediatric): Secondary | ICD-10-CM | POA: Diagnosis not present

## 2023-10-06 DIAGNOSIS — Z6841 Body Mass Index (BMI) 40.0 and over, adult: Secondary | ICD-10-CM | POA: Diagnosis not present

## 2023-10-06 DIAGNOSIS — K760 Fatty (change of) liver, not elsewhere classified: Secondary | ICD-10-CM | POA: Insufficient documentation

## 2023-10-06 DIAGNOSIS — I10 Essential (primary) hypertension: Secondary | ICD-10-CM | POA: Insufficient documentation

## 2023-10-06 DIAGNOSIS — E11622 Type 2 diabetes mellitus with other skin ulcer: Secondary | ICD-10-CM | POA: Insufficient documentation

## 2023-10-06 MED ORDER — VALSARTAN 160 MG PO TABS: 160.0000 mg | ORAL_TABLET | Freq: Every day | ORAL | 1 refills | Status: DC

## 2023-10-06 NOTE — Progress Notes (Signed)
[name] ([id number]) [id number] Page 1 of 4 Visit Report for 10/06/2023 Arrival Information Details Patient Name: Date of Service: [name] 10/06/2023 2:15 PM Medical Record Number: [id number] Patient Account Number: [id number] Date of Birth/Sex: Treating RN: October 22, 1976 (47 y.o. M) Primary Care Natassja Ollis: Richarda Blade Other Clinician: Thayer Dallas Referring Leela Vanbrocklin: Treating Broedy Osbourne/Extender: Georgiann Hahn in Treatment: 6 Visit Information History Since Last Visit Added or deleted any medications: No Patient Arrived: Ambulatory Any new allergies or adverse reactions: No Arrival Time: 15:57 Had a fall or experienced change in No Accompanied By: self activities of daily living that may affect Transfer Assistance: None risk of falls: Patient Identification Verified: Yes Signs or symptoms of abuse/neglect since last visito No Secondary Verification Process Completed: Yes Hospitalized since last visit: No Patient Requires Transmission-Based Precautions: No Implantable device outside of the clinic excluding No Patient Has Alerts: No cellular tissue based products placed in the center since last visit: Has Dressing in Place as Prescribed: Yes Has Compression in Place as Prescribed: Yes Pain Present Now: No Electronic Signature(s) Signed: 10/06/2023 4:23:48 PM By: Thayer Dallas Entered By: Thayer Dallas on 10/06/2023 15:58:39 -------------------------------------------------------------------------------- Compression Therapy Details Patient Name: Date of Service: [name], [name] 10/06/2023 2:15 PM Medical Record Number: [id number] Patient Account Number: [id number] Date of Birth/Sex: Treating RN: 07-11-1976 (47 y.o. M) Primary Care Jenee Spaugh: Richarda Blade Other Clinician: Referring Janina Trafton: Treating Korbyn Chopin/Extender: Virgie Dad, Dinah Weeks in Treatment: 6 Compression Therapy Performed for Wound  Assessment: Wound #2 Right,Anterior Lower Leg Performed By: Clinician Thayer Dallas, Compression Type: Double Layer Electronic Signature(s) Signed: 10/06/2023 4:23:48 PM By: Thayer Dallas Entered By: Thayer Dallas on 10/06/2023 16:01:17 -------------------------------------------------------------------------------- Encounter Discharge Information Details Patient Name: Date of Service: [name], [name] 10/06/2023 2:15 PM Medical Record Number: [id number] Patient Account Number: [id number] [name], [name] ([id number]) [id number].pdf Page 2 of 4 Date of Birth/Sex: Treating RN: [birth year] (47 y.o. M) Primary Care Jacie Tristan: Other Clinician: [name], Carolynne Edouard Referring Leston Schueller: Treating Jewell Haught/Extender: Georgiann Hahn in Treatment: 6 Encounter Discharge Information Items Discharge Condition: Stable Ambulatory Status: Ambulatory Discharge Destination: Home Transportation: Private Auto Accompanied By: self Schedule Follow-up Appointment: Yes Clinical Summary of Care: Electronic Signature(s) Signed: 10/06/2023 4:23:48 PM By: Thayer Dallas Entered By: Thayer Dallas on 10/06/2023 16:03:50 -------------------------------------------------------------------------------- Patient/Caregiver Education Details Patient Name: Date of Service: [name] 12/3/2024andnbsp2:15 PM Medical Record Number: [id number] Patient Account Number: [id number] Date of Birth/Gender: Treating RN: 07/20/1976 (47 y.o. M) Primary Care Physician: Richarda Blade Other Clinician: Thayer Dallas Referring Physician: Treating Physician/Extender: Georgiann Hahn in Treatment: 6 Education Assessment Education Provided To: Patient Education Topics Provided Electronic Signature(s) Signed: 10/06/2023 4:23:48 PM By: Thayer Dallas Entered By: Thayer Dallas on 10/06/2023  16:03:00 -------------------------------------------------------------------------------- Wound Assessment Details Patient Name: Date of Service: [name], [name] 10/06/2023 2:15 PM Medical Record Number: [id number] Patient Account Number: [id number] Date of Birth/Sex: Treating RN: 11-27-1975 (47 y.o. M) Primary Care Sueann Brownley: Richarda Blade Other Clinician: Referring Benjamen Koelling: Treating Blayde Bacigalupi/Extender: Virgie Dad, Dinah Weeks in Treatment: 6 Wound Status Wound Number: 2 Primary Etiology: Diabetic Wound/Ulcer of the Lower Extremity Wound Location: Right, Anterior Lower Leg Wound Status: Open Wounding Event: Bump Date Acquired: 07/18/2023 Weeks Of Treatment: 6 Clustered Wound: No Wound Measurements [name], [name] ([id number]) Length: (cm) 3.5 Width: (cm) 3 Depth: (cm) 0.1 Area: (cm) 8.247 Volume: (cm) 0.825 [id number] Page 3 of 4 % Reduction in Area: 44.4% % Reduction  in Volume: 44.4% Wound Description Classification: Grade 2 Exudate Amount: Medium Exudate Type: Serosanguineous Exudate Color: red, brown Periwound Skin Texture Texture Color No Abnormalities Noted: No No Abnormalities Noted: No Moisture No Abnormalities Noted: No Treatment Notes Wound #2 (Lower Leg) Wound Laterality: Right, Anterior Cleanser Soap and Water Discharge Instruction: May shower and wash wound with dial antibacterial soap and water prior to dressing change. Vashe 5.8 (oz) Discharge Instruction: Cleanse the wound with Vashe prior to applying a clean dressing using gauze sponges, not tissue or cotton balls. Peri-Wound Care Sween Lotion (Moisturizing lotion) Discharge Instruction: Apply moisturizing lotion as directed Topical Primary Dressing Hydrofera Blue Ready Transfer Foam, 4x5 (in/in) Discharge Instruction: Apply to wound bed as instructed Secondary Dressing ABD Pad, 8x10 Discharge Instruction: Apply over primary dressing as directed. Secured  With Compression Wrap Urgo K2 Lite, (equivalent to a 3 layer) two layer compression system, regular Discharge Instruction: Apply Urgo K2 Lite as directed (alternative to 3 layer compression). Compression Stockings Add-Ons Electronic Signature(s) Signed: 10/06/2023 4:23:48 PM By: Thayer Dallas Entered By: Thayer Dallas on 10/06/2023 15:59:34 -------------------------------------------------------------------------------- Vitals Details Patient Name: Date of Service: [name], [name] 10/06/2023 2:15 PM Medical Record Number: [id number] Patient Account Number: [id number] Date of Birth/Sex: Treating RN: 14-Dec-1975 (47 y.o. M) Primary Care Lealon Vanputten: Richarda Blade Other Clinician: Referring Riley Hallum: Treating Maleiya Pergola/Extender: Georgiann Hahn in Treatment: 6 Vital Signs [name], [name] ([id number]) [id number] Page 4 of 4 Time Taken: 15:58 Reference Range: 80 - 120 mg / dl Height (in): 69 Weight (lbs): 330 Body Mass Index (BMI): 48.7 Electronic Signature(s) Signed: 10/06/2023 4:23:48 PM By: Thayer Dallas Entered By: Thayer Dallas on 10/06/2023 15:59:09

## 2023-10-09 ENCOUNTER — Other Ambulatory Visit: Payer: Self-pay | Admitting: Family

## 2023-10-09 DIAGNOSIS — D696 Thrombocytopenia, unspecified: Secondary | ICD-10-CM

## 2023-10-11 NOTE — Progress Notes (Signed)
FUE, STIPES ([id number]) [id number] Page 1 of 1 Visit Report for 10/06/2023 SuperBill Details Patient Name: Date of Service: [name] RD B. 10/06/2023 Medical Record Number: [id number] Patient Account Number: [id number] Date of Birth/Sex: Treating RN: [birth year] (47 y.o. M) Primary Care Provider: Richarda Blade Other Clinician: Referring Provider: Treating Provider/Extender: Georgiann Hahn in Treatment: 6 Diagnosis Coding ICD-10 Codes Code Description [phone] Non-pressure chronic ulcer of other part of right lower leg with unspecified severity E11.622 Type 2 diabetes mellitus with other skin ulcer E66.01 Morbid (severe) obesity due to excess calories Facility Procedures CPT4 Code Description Modifier Quantity [id number] (Facility Use Only) [phone] - APPLY MULTLAY COMPRS LWR RT LEG 1 Electronic Signature(s) Signed: 10/06/2023 4:23:48 PM By: Thayer Dallas Signed: 10/11/2023 9:47:40 AM By: Baltazar Najjar MD Entered By: Thayer Dallas on 10/06/2023 16:04:22

## 2023-10-13 ENCOUNTER — Encounter (HOSPITAL_BASED_OUTPATIENT_CLINIC_OR_DEPARTMENT_OTHER): Payer: BC Managed Care – PPO | Admitting: Internal Medicine

## 2023-10-13 DIAGNOSIS — G4733 Obstructive sleep apnea (adult) (pediatric): Secondary | ICD-10-CM | POA: Diagnosis not present

## 2023-10-13 DIAGNOSIS — E11622 Type 2 diabetes mellitus with other skin ulcer: Secondary | ICD-10-CM | POA: Diagnosis not present

## 2023-10-13 DIAGNOSIS — Z6841 Body Mass Index (BMI) 40.0 and over, adult: Secondary | ICD-10-CM | POA: Diagnosis not present

## 2023-10-13 DIAGNOSIS — I10 Essential (primary) hypertension: Secondary | ICD-10-CM | POA: Diagnosis not present

## 2023-10-13 DIAGNOSIS — K760 Fatty (change of) liver, not elsewhere classified: Secondary | ICD-10-CM | POA: Diagnosis not present

## 2023-10-13 NOTE — Progress Notes (Signed)
DEWONE, SUNDERMAN B ([id number]) [id number] Page 1 of 4 Visit Report for 10/13/2023 Arrival Information Details Patient Name: Date of Service: [name] RD B. 10/13/2023 11:00 A M Medical Record Number: [id number] Patient Account Number: [id number] Date of Birth/Sex: Treating RN: 1976-01-24 (47 y.o. M) Primary Care Fumio Vandam: Richarda Blade Other Clinician: Thayer Dallas Referring Donley Harland: Treating Traivon Morrical/Extender: Georgiann Hahn in Treatment: 7 Visit Information History Since Last Visit Added or deleted any medications: No Patient Arrived: Ambulatory Any new allergies or adverse reactions: No Arrival Time: 11:18 Had a fall or experienced change in No Accompanied By: self activities of daily living that may affect Transfer Assistance: None risk of falls: Patient Identification Verified: Yes Signs or symptoms of abuse/neglect since last visito No Secondary Verification Process Completed: Yes Hospitalized since last visit: No Patient Requires Transmission-Based Precautions: No Implantable device outside of the clinic excluding No Patient Has Alerts: No cellular tissue based products placed in the center since last visit: Has Dressing in Place as Prescribed: Yes Has Compression in Place as Prescribed: Yes Pain Present Now: No Electronic Signature(s) Signed: 10/13/2023 4:21:45 PM By: Thayer Dallas Entered By: Thayer Dallas on 10/13/2023 11:19:14 -------------------------------------------------------------------------------- Compression Therapy Details Patient Name: Date of Service: [name], [name] 10/13/2023 11:00 A M Medical Record Number: [id number] Patient Account Number: [id number] Date of Birth/Sex: Treating RN: [birth year] (47 y.o. M) Primary Care Anie Juniel: Richarda Blade Other Clinician: Referring Lu Paradise: Treating Florena Kozma/Extender: Virgie Dad, Dinah Weeks in Treatment: 7 Compression Therapy Performed  for Wound Assessment: Wound #2 Right,Anterior Lower Leg Performed By: Clinician Thayer Dallas, Compression Type: Double Layer Electronic Signature(s) Signed: 10/13/2023 4:21:45 PM By: Thayer Dallas Entered By: Thayer Dallas on 10/13/2023 12:07:48 -------------------------------------------------------------------------------- Encounter Discharge Information Details Patient Name: Date of Service: [name], [name] 10/13/2023 11:00 A M Medical Record Number: [id number] Patient Account Number: [id number] DAYMION, DEAL ([id number]) [id number] Page 2 of 4 Date of Birth/Sex: Treating RN: 1976-01-25 (47 y.o. M) Primary Care Elka Satterfield: Other Clinician: [name], Carolynne Edouard Referring Jahni Paul: Treating Tal Neer/Extender: Georgiann Hahn in Treatment: 7 Encounter Discharge Information Items Discharge Condition: Stable Ambulatory Status: Ambulatory Discharge Destination: Home Transportation: Private Auto Accompanied By: self Schedule Follow-up Appointment: Yes Clinical Summary of Care: Electronic Signature(s) Signed: 10/13/2023 4:21:45 PM By: Thayer Dallas Entered By: Thayer Dallas on 10/13/2023 12:08:33 -------------------------------------------------------------------------------- Patient/Caregiver Education Details Patient Name: Date of Service: [name] RD B. 12/10/2024andnbsp11:00 A M Medical Record Number: [id number] Patient Account Number: [id number] Date of Birth/Gender: Treating RN: 04-13-1976 (47 y.o. M) Primary Care Physician: Richarda Blade Other Clinician: Thayer Dallas Referring Physician: Treating Physician/Extender: Georgiann Hahn in Treatment: 7 Education Assessment Education Provided To: Patient Education Topics Provided Electronic Signature(s) Signed: 10/13/2023 4:21:45 PM By: Thayer Dallas Entered By: Thayer Dallas on 10/13/2023  12:08:19 -------------------------------------------------------------------------------- Wound Assessment Details Patient Name: Date of Service: [name], [name] 10/13/2023 11:00 A M Medical Record Number: [id number] Patient Account Number: [id number] Date of Birth/Sex: Treating RN: 1976-04-17 (47 y.o. M) Primary Care Cecillia Menees: Richarda Blade Other Clinician: Referring Emet Rafanan: Treating Skylin Kennerson/Extender: Virgie Dad, Dinah Weeks in Treatment: 7 Wound Status Wound Number: 2 Primary Etiology: Diabetic Wound/Ulcer of the Lower Extremity Wound Location: Right, Anterior Lower Leg Wound Status: Open Wounding Event: Bump Date Acquired: 07/18/2023 Weeks Of Treatment: 7 Clustered Wound: No Wound Measurements Wortmann, Diogenes B ([id number]) Length: (cm) 3.5 Width: (cm) 3 Depth: (cm) 0.1 Area: (cm) 8.247 Volume: (cm) 0.825 [id number] Page 3 of 4 % Reduction  in Area: 44.4% % Reduction in Volume: 44.4% Wound Description Classification: Grade 2 Exudate Amount: Medium Exudate Type: Serosanguineous Exudate Color: red, brown Periwound Skin Texture Texture Color No Abnormalities Noted: No No Abnormalities Noted: No Moisture No Abnormalities Noted: No Treatment Notes Wound #2 (Lower Leg) Wound Laterality: Right, Anterior Cleanser Soap and Water Discharge Instruction: May shower and wash wound with dial antibacterial soap and water prior to dressing change. Vashe 5.8 (oz) Discharge Instruction: Cleanse the wound with Vashe prior to applying a clean dressing using gauze sponges, not tissue or cotton balls. Peri-Wound Care Sween Lotion (Moisturizing lotion) Discharge Instruction: Apply moisturizing lotion as directed Topical Primary Dressing Hydrofera Blue Ready Transfer Foam, 4x5 (in/in) Discharge Instruction: Apply to wound bed as instructed Secondary Dressing ABD Pad, 8x10 Discharge Instruction: Apply over primary dressing as  directed. Secured With Compression Wrap Urgo K2 Lite, (equivalent to a 3 layer) two layer compression system, regular Discharge Instruction: Apply Urgo K2 Lite as directed (alternative to 3 layer compression). Compression Stockings Add-Ons Electronic Signature(s) Signed: 10/13/2023 4:21:45 PM By: Thayer Dallas Entered By: Thayer Dallas on 10/13/2023 11:19:40 -------------------------------------------------------------------------------- Vitals Details Patient Name: Date of Service: [name], [name] 10/13/2023 11:00 A M Medical Record Number: [id number] Patient Account Number: [id number] Date of Birth/Sex: Treating RN: 10/05/1976 (47 y.o. M) Primary Care Kailer Heindel: Richarda Blade Other Clinician: Thayer Dallas Referring Armani Gawlik: Treating Yamilka Lopiccolo/Extender: Georgiann Hahn in Treatment: 7 Vital Signs BRAELAN, GIESER B ([id number]) [id number] Page 4 of 4 Time Taken: 11:19 Reference Range: 80 - 120 mg / dl Height (in): 69 Weight (lbs): 330 Body Mass Index (BMI): 48.7 Electronic Signature(s) Signed: 10/13/2023 4:21:45 PM By: Thayer Dallas Entered By: Thayer Dallas on 10/13/2023 11:19:27

## 2023-10-14 ENCOUNTER — Ambulatory Visit: Payer: BC Managed Care – PPO | Admitting: Psychology

## 2023-10-14 NOTE — Progress Notes (Signed)
DREYON, SEBRING ([id number]) [id number] Page 1 of 1 Visit Report for 10/13/2023 SuperBill Details Patient Name: Date of Service: [name] RD B. 10/13/2023 Medical Record Number: [id number] Patient Account Number: [id number] Date of Birth/Sex: Treating RN: 12-25-1975 (47 y.o. M) Primary Care Provider: Richarda Blade Other Clinician: Thayer Dallas Referring Provider: Treating Provider/Extender: Georgiann Hahn in Treatment: 7 Diagnosis Coding ICD-10 Codes Code Description [phone] Non-pressure chronic ulcer of other part of right lower leg with unspecified severity E11.622 Type 2 diabetes mellitus with other skin ulcer E66.01 Morbid (severe) obesity due to excess calories Facility Procedures CPT4 Code Description Modifier Quantity [id number] (Facility Use Only) [phone] - APPLY MULTLAY COMPRS LWR RT LEG 1 Electronic Signature(s) Signed: 10/13/2023 4:21:45 PM By: Thayer Dallas Signed: 10/14/2023 10:03:28 AM By: Baltazar Najjar MD Entered By: Thayer Dallas on 10/13/2023 09:08:56

## 2023-10-16 ENCOUNTER — Other Ambulatory Visit: Payer: Self-pay | Admitting: Family

## 2023-10-16 DIAGNOSIS — E1169 Type 2 diabetes mellitus with other specified complication: Secondary | ICD-10-CM

## 2023-10-16 DIAGNOSIS — E669 Obesity, unspecified: Secondary | ICD-10-CM

## 2023-10-20 ENCOUNTER — Ambulatory Visit (HOSPITAL_BASED_OUTPATIENT_CLINIC_OR_DEPARTMENT_OTHER): Payer: BC Managed Care – PPO | Admitting: General Surgery

## 2023-10-22 ENCOUNTER — Ambulatory Visit: Payer: BC Managed Care – PPO | Admitting: Psychology

## 2023-10-22 ENCOUNTER — Encounter (HOSPITAL_BASED_OUTPATIENT_CLINIC_OR_DEPARTMENT_OTHER): Payer: BC Managed Care – PPO | Admitting: General Surgery

## 2023-10-22 DIAGNOSIS — E11622 Type 2 diabetes mellitus with other skin ulcer: Secondary | ICD-10-CM | POA: Diagnosis not present

## 2023-10-22 DIAGNOSIS — K760 Fatty (change of) liver, not elsewhere classified: Secondary | ICD-10-CM | POA: Diagnosis not present

## 2023-10-22 DIAGNOSIS — Z6841 Body Mass Index (BMI) 40.0 and over, adult: Secondary | ICD-10-CM | POA: Diagnosis not present

## 2023-10-22 DIAGNOSIS — I10 Essential (primary) hypertension: Secondary | ICD-10-CM | POA: Diagnosis not present

## 2023-10-22 DIAGNOSIS — G4733 Obstructive sleep apnea (adult) (pediatric): Secondary | ICD-10-CM | POA: Diagnosis not present

## 2023-10-24 NOTE — Progress Notes (Signed)
JACQUEES, WAGY B ([id number]) [id number] Page 1 of 1 Visit Report for 10/22/2023 SuperBill Details Patient Name: Date of Service: [name] RD B. 10/22/2023 Medical Record Number: [id number] Patient Account Number: [id number] Date of Birth/Sex: Treating RN: 09/18/1976 (47 y.o. 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

## 2023-10-24 NOTE — Progress Notes (Signed)
JANZEN, KLEBE [name] ([id number]) [id number] Page 1 of 4 Visit Report for 10/22/2023 Arrival Information Details Patient Name: Date of Service: [name], [place] RD [name] 10/22/2023 2:15 PM Medical Record Number: [id number] Patient Account Number: [id number] Date of Birth/Sex: Treating RN: 01-Nov-1976 (47 y.o. [name] Primary Care Esbeidy Mclaine: Richarda Blade Other Clinician: Referring Jahzion Brogden: Treating Karalee Hauter/Extender: Pleas Patricia in Treatment: 9 Visit Information History Since Last Visit Added or deleted any medications: No Patient Arrived: Ambulatory Any new allergies or adverse reactions: No Arrival Time: 15:30 Had a fall or experienced change in No Accompanied By: self activities of daily living that may affect Transfer Assistance: None risk of falls: Patient Identification Verified: Yes Signs or symptoms of abuse/neglect since last visito No Secondary Verification Process Completed: Yes Hospitalized since last visit: No Patient Requires Transmission-Based Precautions: No Implantable device outside of the clinic excluding No Patient Has Alerts: No cellular tissue based products placed in the center since last visit: Has Dressing in Place as Prescribed: Yes Has Compression in Place as Prescribed: Yes Pain Present Now: No Electronic Signature(s) Signed: 10/23/2023 12:52:17 PM By: Zenaida Deed RN, BSN Entered By: Zenaida Deed on 10/22/2023 14:20:53 -------------------------------------------------------------------------------- Compression Therapy Details Patient Name: Date of Service: [name], [name] 10/22/2023 2:15 PM Medical Record Number: [id number] Patient Account Number: [id number] Date of Birth/Sex: Treating RN: [birth year] (47 y.o. [name] Primary Care Larna Capelle: Richarda Blade Other Clinician: Referring Ramah Langhans: Treating Kadia Abaya/Extender: Pleas Patricia in Treatment:  9 Compression Therapy Performed for Wound Assessment: Wound #2 Right,Anterior Lower Leg Performed By: Clinician Zenaida Deed, RN Compression Type: Double Layer Notes Stephannie Li Electronic Signature(s) Signed: 10/23/2023 12:52:17 PM By: Zenaida Deed RN, BSN Entered By: Zenaida Deed on 10/22/2023 14:21:56 [name] ([id number]) [id number] Page 2 of 4 -------------------------------------------------------------------------------- Encounter Discharge Information Details Patient Name: Date of Service: [name], [place] RD [name] 10/22/2023 2:15 PM Medical Record Number: [id number] Patient Account Number: [id number] Date of Birth/Sex: Treating RN: [birth year] (47 y.o. [name] Primary Care Laqueshia Cihlar: Richarda Blade Other Clinician: Referring Epifania Littrell: Treating Lonnetta Kniskern/Extender: Pleas Patricia in Treatment: 9 Encounter Discharge Information Items Discharge Condition: Stable Ambulatory Status: Ambulatory Discharge Destination: Home Transportation: Private Auto Accompanied By: self Schedule Follow-up Appointment: Yes Clinical Summary of Care: Patient Declined Electronic Signature(s) Signed: 10/23/2023 12:52:17 PM By: Zenaida Deed RN, BSN Entered By: Zenaida Deed on 10/22/2023 14:22:32 -------------------------------------------------------------------------------- Patient/Caregiver Education Details Patient Name: Date of Service: [name] RD [name] 12/19/2024andnbsp2:15 PM Medical Record Number: [id number] Patient Account Number: [id number] Date of Birth/Gender: Treating RN: May 10, 1976 (47 y.o. [name] Primary Care Physician: Richarda Blade Other Clinician: Referring Physician: Treating Physician/Extender: Pleas Patricia in Treatment: 9 Education Assessment Education Provided To: Patient Education Topics Provided Venous: Methods: Explain/Verbal Responses: Reinforcements needed,  State content correctly Electronic Signature(s) Signed: 10/23/2023 12:52:17 PM By: Zenaida Deed RN, BSN Entered By: Zenaida Deed on 10/22/2023 14:22:17 -------------------------------------------------------------------------------- Wound Assessment Details Patient Name: Date of Service: [name], [name] 10/22/2023 2:15 PM Medical Record Number: [id number] Patient Account Number: [id number] Date of Birth/Sex: Treating RN: 16-Apr-1976 (47 y.o. [name] Primary Care Brynnly Bonet: Richarda Blade Other Clinician: Referring Afnan Emberton: Treating Kamarri Lovvorn/Extender: Jason Nest, Dinah Weeks in Treatment: [address] DMARIO, [name] ([id number]) [id number] Page 3 of 4 Wound Number: 2 Primary Etiology: Diabetic Wound/Ulcer of the Lower Extremity Wound Location: Right, Anterior Lower Leg Wound Status: Open Wounding Event: Bump Comorbid History: Sleep Apnea, Hypertension, Type II  Diabetes Date Acquired: 07/18/2023 Weeks Of Treatment: 9 Clustered Wound: No Wound Measurements Length: (cm) 3.5 Width: (cm) 3 Depth: (cm) 0.1 Area: (cm) 8.247 Volume: (cm) 0.825 % Reduction in Area: 44.4% % Reduction in Volume: 44.4% Epithelialization: Small (1-33%) Tunneling: No Undermining: No Wound Description Classification: Grade 2 Wound Margin: Flat and Intact Exudate Amount: Medium Exudate Type: Serosanguineous Exudate Color: red, brown Foul Odor After Cleansing: No Slough/Fibrino No Wound Bed Granulation Amount: Large (67-100%) Exposed Structure Granulation Quality: Red, Friable Fascia Exposed: No Necrotic Amount: None Present (0%) Fat Layer (Subcutaneous Tissue) Exposed: Yes Tendon Exposed: No Muscle Exposed: No Joint Exposed: No Bone Exposed: No Periwound Skin Texture Texture Color No Abnormalities Noted: Yes No Abnormalities Noted: No Hemosiderin Staining: Yes Moisture No Abnormalities Noted: Yes Temperature / Pain Temperature: No  Abnormality Treatment Notes Wound #2 (Lower Leg) Wound Laterality: Right, Anterior Cleanser Soap and Water Discharge Instruction: May shower and wash wound with dial antibacterial soap and water prior to dressing change. Vashe 5.8 (oz) Discharge Instruction: Cleanse the wound with Vashe prior to applying a clean dressing using gauze sponges, not tissue or cotton balls. Peri-Wound Care Sween Lotion (Moisturizing lotion) Discharge Instruction: Apply moisturizing lotion as directed Topical Primary Dressing Hydrofera Blue Ready Transfer Foam, 4x5 (in/in) Discharge Instruction: Apply to wound bed as instructed Secondary Dressing ABD Pad, 8x10 Discharge Instruction: Apply over primary dressing as directed. Secured With Compression Wrap Urgo K2 Lite, (equivalent to a 3 layer) two layer compression system, regular Discharge Instruction: Apply Urgo K2 Lite as directed (alternative to 3 layer compression). Compression Stockings Add-Ons Electronic Signature(s) Signed: 10/23/2023 12:52:17 PM By: Zenaida Deed RN, BSN [place], Ponderosa [name] ([id number]) [id number] Page 4 of 4 Entered By: Zenaida Deed on 10/22/2023 14:21:35

## 2023-10-27 ENCOUNTER — Encounter (HOSPITAL_BASED_OUTPATIENT_CLINIC_OR_DEPARTMENT_OTHER): Payer: BC Managed Care – PPO | Admitting: General Surgery

## 2023-10-27 DIAGNOSIS — G4733 Obstructive sleep apnea (adult) (pediatric): Secondary | ICD-10-CM | POA: Diagnosis not present

## 2023-10-27 DIAGNOSIS — Z6841 Body Mass Index (BMI) 40.0 and over, adult: Secondary | ICD-10-CM | POA: Diagnosis not present

## 2023-10-27 DIAGNOSIS — I10 Essential (primary) hypertension: Secondary | ICD-10-CM | POA: Diagnosis not present

## 2023-10-27 DIAGNOSIS — K760 Fatty (change of) liver, not elsewhere classified: Secondary | ICD-10-CM | POA: Diagnosis not present

## 2023-10-27 DIAGNOSIS — L97812 Non-pressure chronic ulcer of other part of right lower leg with fat layer exposed: Secondary | ICD-10-CM | POA: Diagnosis not present

## 2023-10-27 DIAGNOSIS — E11622 Type 2 diabetes mellitus with other skin ulcer: Secondary | ICD-10-CM | POA: Diagnosis not present

## 2023-10-27 NOTE — Progress Notes (Signed)
[name], [name] ([id number]) [id number] Page 1 of 7 Visit Report for 10/27/2023 Arrival Information Details Patient Name: Date of Service: [name], [place] RD [name] 10/27/2023 9:15 A M Medical Record Number: [id number] Patient Account Number: [id number] Date of Birth/Sex: Treating RN: Sep 23, 1976 (47 y.o. Cline Cools Primary Care Janeliz Prestwood: Richarda Blade Other Clinician: Referring Gladine Plude: Treating Jaxxon Naeem/Extender: Pleas Patricia in Treatment: 9 Visit Information History Since Last Visit Added or deleted any medications: No Patient Arrived: Ambulatory Any new allergies or adverse reactions: No Arrival Time: 09:33 Had a fall or experienced change in No Accompanied By: self activities of daily living that may affect Transfer Assistance: None risk of falls: Patient Identification Verified: Yes Signs or symptoms of abuse/neglect since last visito No Secondary Verification Process Completed: Yes Hospitalized since last visit: No Patient Requires Transmission-Based Precautions: No Implantable device outside of the clinic excluding No Patient Has Alerts: No cellular tissue based products placed in the center since last visit: Has Dressing in Place as Prescribed: Yes Has Compression in Place as Prescribed: Yes Pain Present Now: Yes Electronic Signature(s) Signed: 10/27/2023 12:06:25 PM By: Redmond Pulling RN, BSN Entered By: Redmond Pulling on 10/27/2023 06:34:28 -------------------------------------------------------------------------------- Compression Therapy Details Patient Name: Date of Service: [name], [name] 10/27/2023 9:15 A M Medical Record Number: [id number] Patient Account Number: [id number] Date of Birth/Sex: Treating RN: 1976-06-03 (47 y.o. Cline Cools Primary Care Camika Marsico: Richarda Blade Other Clinician: Referring Huyen Perazzo: Treating Tyyne Cliett/Extender: Pleas Patricia in Treatment:  9 Compression Therapy Performed for Wound Assessment: Wound #2 Right,Anterior Lower Leg Performed By: Clinician Redmond Pulling, RN Compression Type: Three Layer Post Procedure Diagnosis Same as Pre-procedure Electronic Signature(s) Signed: 10/27/2023 12:06:25 PM By: Redmond Pulling RN, BSN Entered By: Redmond Pulling on 10/27/2023 06:48:49 [name] ([id number]) [id number] Page 2 of 7 -------------------------------------------------------------------------------- Encounter Discharge Information Details Patient Name: Date of Service: [name] RD [name] 10/27/2023 9:15 A M Medical Record Number: [id number] Patient Account Number: [id number] Date of Birth/Sex: Treating RN: 11/28/1975 (47 y.o. Cline Cools Primary Care Erlene Devita: Richarda Blade Other Clinician: Referring Sharrod Achille: Treating Renise Gillies/Extender: Pleas Patricia in Treatment: 9 Encounter Discharge Information Items Discharge Condition: Stable Ambulatory Status: Ambulatory Discharge Destination: Home Transportation: Private Auto Accompanied By: wife Schedule Follow-up Appointment: Yes Clinical Summary of Care: Patient Declined Electronic Signature(s) Signed: 10/27/2023 12:06:25 PM By: Redmond Pulling RN, BSN Entered By: Redmond Pulling on 10/27/2023 07:03:15 -------------------------------------------------------------------------------- Lower Extremity Assessment Details Patient Name: Date of Service: [name], [name] 10/27/2023 9:15 A M Medical Record Number: [id number] Patient Account Number: [id number] Date of Birth/Sex: Treating RN: [birth year] (47 y.o. Cline Cools Primary Care Dereon Williamsen: Richarda Blade Other Clinician: Referring Maxine Huynh: Treating Adriahna Shearman/Extender: Duanne Guess [name], Dinah Weeks in Treatment: 9 Edema Assessment Assessed: [Left: No] [Right: No] [Left: Edema] [Right: :] Calf Left: Right: Point of Measurement: From Medial Instep 39  cm Ankle Left: Right: Point of Measurement: From Medial Instep 26 cm Vascular Assessment Pulses: Dorsalis Pedis Palpable: [Right:Yes] Extremity colors, hair growth, and conditions: Extremity Color: [Right:Hyperpigmented] Hair Growth on Extremity: [Right:No] Temperature of Extremity: [Right:Warm] Capillary Refill: [Right:< 3 seconds] Dependent Rubor: [Right:No No] Electronic Signature(s) Signed: 10/27/2023 12:06:25 PM By: Redmond Pulling RN, BSN Entered By: Redmond Pulling on 10/27/2023 06:37:14 [name] ([id number]) [id number] Page 3 of 7 -------------------------------------------------------------------------------- Multi Wound Chart Details Patient Name: Date of Service: [name] RD [name] 10/27/2023 9:15 A M Medical Record Number: [id number] Patient Account Number: [id number] Date of  Birth/Sex: Treating RN: [birth year] (47 y.o. M) Primary Care Brittni Hult: Richarda Blade Other Clinician: Referring Alzina Golda: Treating Dylan Ruotolo/Extender: Jason Nest, Dinah Weeks in Treatment: 9 Vital Signs Height(in): 69 Pulse(bpm): 102 Weight(lbs): 330 Blood Pressure(mmHg): 169/107 Body Mass Index(BMI): 48.7 Temperature(F): 98.1 Respiratory Rate(breaths/min): 18 [2:Photos:] [N/A:N/A] Right, Anterior Lower Leg N/A N/A Wound Location: Bump N/A N/A Wounding Event: Diabetic Wound/Ulcer of the Lower N/A N/A Primary Etiology: Extremity Sleep Apnea, Hypertension, Type II N/A N/A Comorbid History: Diabetes 07/18/2023 N/A N/A Date Acquired: 9 N/A N/A Weeks of Treatment: Open N/A N/A Wound Status: No N/A N/A Wound Recurrence: 3x2.5x0.1 N/A N/A Measurements L x W x D (cm) 5.89 N/A N/A A (cm) : rea 0.589 N/A N/A Volume (cm) : 60.30% N/A N/A % Reduction in A rea: 60.30% N/A N/A % Reduction in Volume: Grade 2 N/A N/A Classification: Medium N/A N/A Exudate A mount: Serosanguineous N/A N/A Exudate Type: red, brown N/A N/A Exudate  Color: Flat and Intact N/A N/A Wound Margin: Large (67-100%) N/A N/A Granulation A mount: Red, Friable N/A N/A Granulation Quality: None Present (0%) N/A N/A Necrotic A mount: Fat Layer (Subcutaneous Tissue): Yes N/A N/A Exposed Structures: Fascia: No Tendon: No Muscle: No Joint: No Bone: No Small (1-33%) N/A N/A Epithelialization: No Abnormalities Noted N/A N/A Periwound Skin Texture: No Abnormalities Noted N/A N/A Periwound Skin Moisture: Hemosiderin Staining: Yes N/A N/A Periwound Skin Color: No Abnormality N/A N/A Temperature: Compression Therapy N/A N/A Procedures Performed: Treatment Notes Electronic Signature(s) Signed: 10/27/2023 10:30:31 AM By: Duanne Guess MD FACS Entered By: Duanne Guess on 10/27/2023 06:51:40 [name] ([id number]) [id number] Page 4 of 7 -------------------------------------------------------------------------------- Multi-Disciplinary Care Plan Details Patient Name: Date of Service: [name] RD [name] 10/27/2023 9:15 A M Medical Record Number: [id number] Patient Account Number: [id number] Date of Birth/Sex: Treating RN: 07/18/1976 (47 y.o. Cline Cools Primary Care Kymberli Wiegand: Richarda Blade Other Clinician: Referring Dinara Lupu: Treating Tokiko Diefenderfer/Extender: Duanne Guess [name], Dinah Weeks in Treatment: 9 Active Inactive Wound/Skin Impairment Nursing Diagnoses: Knowledge deficit related to ulceration/compromised skin integrity Goals: Patient/caregiver will verbalize understanding of skin care regimen Date Initiated: 08/20/2023 Target Resolution Date: 12/04/2023 Goal Status: Active Interventions: Assess patient/caregiver ability to obtain necessary supplies Assess patient/caregiver ability to perform ulcer/skin care regimen upon admission and as needed Assess ulceration(s) every visit Provide education on ulcer and skin care Screen for HBO Treatment Activities: Skin care regimen initiated  : 08/20/2023 Topical wound management initiated : 08/20/2023 Notes: Electronic Signature(s) Signed: 10/27/2023 12:06:25 PM By: Redmond Pulling RN, BSN Entered By: Redmond Pulling on 10/27/2023 06:48:17 -------------------------------------------------------------------------------- Pain Assessment Details Patient Name: Date of Service: [name], [name] 10/27/2023 9:15 A M Medical Record Number: [id number] Patient Account Number: [id number] Date of Birth/Sex: Treating RN: [birth year] (47 y.o. Cline Cools Primary Care Erma Joubert: Richarda Blade Other Clinician: Referring Fayola Meckes: Treating Alleah Dearman/Extender: Duanne Guess [name], Dinah Weeks in Treatment: 9 Active Problems Location of Pain Severity and Description of Pain Patient Has Paino Yes Site Locations Rate the pain. [name], [name] ([id number]) [id number] Page 5 of 7 Rate the pain. Current Pain Level: 2 Worst Pain Level: 6 Pain Management and Medication Current Pain Management: Electronic Signature(s) Signed: 10/27/2023 12:06:25 PM By: Redmond Pulling RN, BSN Entered By: Redmond Pulling on 10/27/2023 06:35:13 -------------------------------------------------------------------------------- Patient/Caregiver Education Details Patient Name: Date of Service: [name] RD [name] 12/24/2024andnbsp9:15 A M Medical Record Number: [id number] Patient Account Number: [id number] Date of Birth/Gender: Treating RN: 1976/01/29 (47 y.o. Cline Cools Primary Care Physician: Richarda Blade Other Clinician:  Referring Physician: Treating Physician/Extender: Pleas Patricia in Treatment: 9 Education Assessment Education Provided To: Patient Education Topics Provided Wound/Skin Impairment: Methods: Explain/Verbal Responses: State content correctly [employer]) Signed: 10/27/2023 12:06:25 PM By: Redmond Pulling RN, BSN Entered By: Redmond Pulling on 10/27/2023  06:48:30 -------------------------------------------------------------------------------- Wound Assessment Details Patient Name: Date of Service: [name], [name] 10/27/2023 9:15 A M Medical Record Number: [id number] Patient Account Number: [id number] Date of Birth/Sex: Treating RN: [birth year] (47 y.o. Cline Cools Primary Care Kierra Jezewski: Richarda Blade Other Clinician: Referring Angelis Gates: Treating Danasia Baker/Extender: Jerrald, T[place] [name] ([id number]) [id number] Page 6 of 7 Weeks in Treatment: 9 Wound Status Wound Number: 2 Primary Etiology: Diabetic Wound/Ulcer of the Lower Extremity Wound Location: Right, Anterior Lower Leg Wound Status: Open Wounding Event: Bump Comorbid History: Sleep Apnea, Hypertension, Type II Diabetes Date Acquired: 07/18/2023 Weeks Of Treatment: 9 Clustered Wound: No Photos Wound Measurements Length: (cm) 3 Width: (cm) 2.5 Depth: (cm) 0.1 Area: (cm) 5.89 Volume: (cm) 0.589 % Reduction in Area: 60.3% % Reduction in Volume: 60.3% Epithelialization: Small (1-33%) Tunneling: No Undermining: No Wound Description Classification: Grade 2 Wound Margin: Flat and Intact Exudate Amount: Medium Exudate Type: Serosanguineous Exudate Color: red, brown Foul Odor After Cleansing: No Slough/Fibrino No Wound Bed Granulation Amount: Large (67-100%) Exposed Structure Granulation Quality: Red, Friable Fascia Exposed: No Necrotic Amount: None Present (0%) Fat Layer (Subcutaneous Tissue) Exposed: Yes Tendon Exposed: No Muscle Exposed: No Joint Exposed: No Bone Exposed: No Periwound Skin Texture Texture Color No Abnormalities Noted: Yes No Abnormalities Noted: No Hemosiderin Staining: Yes Moisture No Abnormalities Noted: Yes Temperature / Pain Temperature: No Abnormality Treatment Notes Wound #2 (Lower Leg) Wound Laterality: Right, Anterior Cleanser Soap and Water Discharge Instruction: May shower  and wash wound with dial antibacterial soap and water prior to dressing change. Vashe 5.8 (oz) Discharge Instruction: Cleanse the wound with Vashe prior to applying a clean dressing using gauze sponges, not tissue or cotton balls. Peri-Wound Care Sween Lotion (Moisturizing lotion) Discharge Instruction: Apply moisturizing lotion as directed Topical Primary Dressing Hydrofera Blue Ready Transfer Foam, 4x5 (in/in) Discharge Instruction: Apply to wound bed as instructed [name], [name] ([id number]) [id number] Page 7 of 7 Secondary Dressing ABD Pad, 8x10 Discharge Instruction: Apply over primary dressing as directed. Secured With Compression Wrap Urgo K2 Lite, (equivalent to a 3 layer) two layer compression system, regular Discharge Instruction: Apply Urgo K2 Lite as directed (alternative to 3 layer compression). Compression Stockings Add-Ons Electronic Signature(s) Signed: 10/27/2023 12:06:25 PM By: Redmond Pulling RN, BSN Entered By: Redmond Pulling on 10/27/2023 06:40:15 -------------------------------------------------------------------------------- Vitals Details Patient Name: Date of Service: [name], [name] 10/27/2023 9:15 A M Medical Record Number: [id number] Patient Account Number: [id number] Date of Birth/Sex: Treating RN: 17-Oct-1976 (47 y.o. Cline Cools Primary Care Taunia Frasco: Richarda Blade Other Clinician: Referring Kaylaann Mountz: Treating Taylin Leder/Extender: Duanne Guess [name], Dinah Weeks in Treatment: 9 Vital Signs Time Taken: 09:32 Temperature (F): 98.1 Height (in): 69 Pulse (bpm): 102 Weight (lbs): 330 Respiratory Rate (breaths/min): 18 Body Mass Index (BMI): 48.7 Blood Pressure (mmHg): 169/107 Reference Range: 80 - 120 mg / dl Electronic Signature(s) Signed: 10/27/2023 12:06:25 PM By: Redmond Pulling RN, BSN Entered By: Redmond Pulling on 10/27/2023 06:34:59

## 2023-10-27 NOTE — Progress Notes (Signed)
[name] ([id number]) [id number] Page 1 of 7 Visit Report for 10/27/2023 Chief Complaint Document Details Patient Name: Date of Service: [name] RD [name] 10/27/2023 9:15 A M Medical Record Number: [id number] Patient Account Number: [id number] Date of Birth/Sex: Treating RN: 1976/10/28 (47 y.o. M) Primary Care Provider: Richarda Blade Other Clinician: Referring Provider: Treating Provider/Extender: Jason Nest, Dinah Weeks in Treatment: 9 Information Obtained from: Patient Chief Complaint 11/23/2018; patient is here for review of an ulcer on the left posterior calf 10/06/2019; patient is here for review of the wound area from the previous visit which is on the left posterior lateral calf 08/20/2023: here for new wound on RLE anterior Electronic Signature(s) Signed: 10/27/2023 10:30:31 AM By: Duanne Guess MD FACS Entered By: Duanne Guess on 10/27/2023 06:51:50 -------------------------------------------------------------------------------- HPI Details Patient Name: Date of Service: [name], [name] RD [name] 10/27/2023 9:15 A M Medical Record Number: [id number] Patient Account Number: [id number] Date of Birth/Sex: Treating RN: 1976-03-02 (47 y.o. M) Primary Care Provider: Richarda Blade Other Clinician: Referring Provider: Treating Provider/Extender: Jason Nest, Dinah Weeks in Treatment: 9 History of Present Illness HPI Description: Admission 11/23/2018 This is a 47 year old man with type 2 diabetes. He tells [place] that about a week before Thanksgiving he noticed a small painful area on the back of his left mid calf. He saw a physician at urgent care on 10/10/2018 noted to have an open wound on the left calf. He was given Bactroban and Keflex as CandS was apparently negative. He saw his own primary doctor on 612/31/19 and was given Silvadene and doxycycline. He has been using Silvadene since then. He is saw his primary physician  again on 1/10 was offered an Radio broadcast assistant but I do not think that he actually use this. The area in question is on the left posterior calf. There is some question that this was a insect bite and when he first presented but he certainly did not notice his any particular bite injury at that time. This did actually apparently at one point look as though it was progressing towards closure although it then it opened again. He has been using Silvadene for the past 3 weeks Past medical history; obstructive sleep apnea, Achilles tendinitis, hypertension, type 2 diabetes, cholecystectomy, fatty liver, ABI in this clinic was 1.38 on the left 11/30/2018; the area is essentially unchanged in size although the surface of the wound looks better. He has surrounding inflammation around the wound the exact etiology of this is unclear. 12/07/2018; the area has come down in diameter. Surface requires debridement. We have been using silver collagen. Is the wound seems to have come down in size I have elected not to biopsy this. Surrounding inflammation is also less 2/11; the area continues to come down in size and generally looks healthy. We have been using silver collagen under compression surrounding inflammation is less there is no tenderness 2/18; the area continues to contract in size and looks healthy. We have been using silver collagen under compression 2/25; no major change in dimension this week. However the wound bed continues to look healthy and there appears to be less inflammation around the wound 3/3; the wound measures slightly smaller. Healthy looking surface. Less inflammation 3/10; wound is closed. Healthy looking surface. Still some discoloration around the wound area but absolutely no tenderness. The cause of this wound was never really determined. However it progressed nicely towards healing READMISSION 10/06/2019 [name], [name] ([id number]) [phone].pdf Page 2 of 7 This is a  patient I dealt with with an unusual wound on the left posterior lateral calf in the early part of this year. I was never really completely sure I understood what this was in terms of causation. However wde use silver collagen under compression and the area responded nicely and we are able to discharge him after roughly 6 weeks in the clinic. He did not clearly have arterial or venous issues. I had some thoughts about biopsying this area however acid continued to get better I did not go through with this. He states that everything is been fine up until the last few weeks. His wife noted a scab with perhaps some purulent drainage. They called primary care and was prescribed doxy which she is finished and everything seems to have settled down there was no open area however the patient wanted [place] to look at this. READMISSION 08/20/2023 ***ABIs L: 1.04; R: 1.11*** This is a now 47 year old type II diabetic (last hemoglobin A1c 6.7%) who returns with a new wound on his right lower extremity, anterior tibial surface. He says it began exactly the way he is previous wound that led to wound care center admission started. He was mowing the lawn and developed a bump on his leg. It subsequently broke down resulting in a large ulcer. Patient was seen in the emergency department on 04 August 2023. He was given a prescription for Keflex and Bactrim. He was referred to the wound care center for further evaluation and management. 08/28/2023: The wound measured a little bit larger today and has a nonviable surface. There is still an odor coming from the wound. Edema control is good. The culture that I took last week grew out a polymicrobial population but all organisms were at fairly low levels. 09/02/2023: The wound size was stable today, but it is markedly cleaner with just a little bit of slough and eschar around the edges. The odor has abated. He has had quite a bit of drainage. Edema control is  excellent. 09/15/2023: The wound was a little bit smaller today and the surface is incredibly clean without any slough or eschar buildup. No odor or significant drainage. Edema control is excellent. 09/22/2023: The wound is smaller with healthy and robust granulation tissue filling in. Edema control remains excellent. 09/29/2023: The wound continues to contract. There is very minimal slough accumulation and the granulation tissue is healthy. Good edema control. 10/27/2023: The wound is smaller again today. The surface is extremely clean with healthy granulation tissue present. Good edema control. Electronic Signature(s) Signed: 10/27/2023 10:30:31 AM By: Duanne Guess MD FACS Entered By: Duanne Guess on 10/27/2023 06:52:30 -------------------------------------------------------------------------------- Physical Exam Details Patient Name: Date of Service: [name], [name] RD [name] 10/27/2023 9:15 A M Medical Record Number: [id number] Patient Account Number: [id number] Date of Birth/Sex: Treating RN: [birth year] (47 y.o. M) Primary Care Provider: Richarda Blade Other Clinician: Referring Provider: Treating Provider/Extender: Jason Nest, Dinah Weeks in Treatment: 9 Constitutional Hypertensive, asymptomatic. Slightly tachycardic. . . no acute distress. Respiratory Normal work of breathing on room air.. Notes 10/27/2023: The wound is smaller again today. The surface is extremely clean with healthy granulation tissue present. Good edema control. Electronic Signature(s) Signed: 10/27/2023 10:30:31 AM By: Duanne Guess MD FACS Entered By: Duanne Guess on 10/27/2023 06:54:05 -------------------------------------------------------------------------------- Physician Orders Details Patient Name: Date of Service: [name], [name] RD [name] 10/27/2023 9:15 A M Medical Record Number: [id number] Patient Account Number: [id number] Date of Birth/Sex: Treating RN: Nov 02, 1976 (47 y.o. Cline Cools Primary Care Provider:  [name], Dinah Other Clinician: Referring Provider: Treating Provider/Extender: Henos, Parlin [place] [name] ([id number]) [id number] Page 3 of 7 Weeks in Treatment: 9 Verbal / Phone Orders: No Diagnosis Coding ICD-10 Coding Code Description L97.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 with unspecified severity E11.622 Type 2 diabetes mellitus with other skin ulcer E66.01 Morbid (severe) obesity due to excess calories Follow-up Appointments ppointment in 1 week. - Dr. Lady Gary Room 2 Return A Anesthetic (In clinic) Topical Lidocaine 4% applied to wound bed Bathing/ Shower/ Hygiene May shower with protection but do not get wound dressing(s) wet. Protect dressing(s) with water repellant cover (for example, large plastic bag) or a cast cover and may then take shower. Edema Control - Orders / Instructions Elevate legs to the level of the heart or above for 30 minutes daily and/or when sitting for 3-4 times a day throughout the day. A void standing for long periods of time. If compression wraps slide down please call wound center and speak with a nurse. Wound Treatment Wound #2 - Lower Leg Wound Laterality: Right, Anterior Cleanser: Soap and Water 1 x Per Week/30 Days Discharge Instructions: May shower and wash wound with dial antibacterial soap and water prior to dressing change. Cleanser: Vashe 5.8 (oz) 1 x Per Week/30 Days Discharge Instructions: Cleanse the wound with Vashe prior to applying a clean dressing using gauze sponges, not tissue or cotton balls. Peri-Wound Care: Sween Lotion (Moisturizing lotion) 1 x Per Week/30 Days Discharge Instructions: Apply moisturizing lotion as directed Prim Dressing: Hydrofera Blue Ready Transfer Foam, 4x5 (in/in) 1 x Per Week/30 Days ary Discharge Instructions: Apply to wound bed as instructed Secondary Dressing: ABD Pad, 8x10 1 x Per Week/30  Days Discharge Instructions: Apply over primary dressing as directed. Compression Wrap: Urgo K2 Lite, (equivalent to a 3 layer) two layer compression system, regular 1 x Per Week/30 Days Discharge Instructions: Apply Urgo K2 Lite as directed (alternative to 3 layer compression). Patient Medications llergies: No Known Allergies A Notifications Medication Indication Start End 10/27/2023 lidocaine DOSE topical 5 % ointment - ointment topical once daily Electronic Signature(s) Signed: 10/27/2023 10:30:31 AM By: Duanne Guess MD FACS Entered By: Duanne Guess on 10/27/2023 06:54:18 -------------------------------------------------------------------------------- Problem List Details Patient Name: Date of Service: [name], [name] RD [name] 10/27/2023 9:15 A M Medical Record Number: [id number] Patient Account Number: [id number] Date of Birth/Sex: Treating RN: 06-26-1976 (47 y.o. M) Primary Care Provider: Richarda Blade Other Clinician: FRANKLIN, BRESSAN ([id number]) [id number] Page 4 of 7 Referring Provider: Treating Provider/Extender: Jason Nest, Dinah Weeks in Treatment: 9 Active Problems ICD-10 Encounter Code Description Active Date MDM Diagnosis L97.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 with unspecified 08/20/2023 No Yes severity E11.622 Type 2 diabetes mellitus with other skin ulcer 08/20/2023 No Yes E66.01 Morbid (severe) obesity due to excess calories 08/20/2023 No Yes Inactive Problems Resolved Problems Electronic Signature(s) Signed: 10/27/2023 10:30:31 AM By: Duanne Guess MD FACS Entered By: Duanne Guess on 10/27/2023 06:51:32 -------------------------------------------------------------------------------- Progress Note Details Patient Name: Date of Service: [name], [name] RD [name] 10/27/2023 9:15 A M Medical Record Number: [id number] Patient Account Number: [id number] Date of Birth/Sex: Treating  RN: 23-May-1976 (47 y.o. M) Primary Care Provider: Richarda Blade Other Clinician: Referring Provider: Treating Provider/Extender: Jason Nest, Dinah Weeks in Treatment: 9 Subjective Chief Complaint Information obtained from Patient 11/23/2018; patient is here for review of an ulcer on the left posterior calf 10/06/2019; patient is here for review of the wound area  from the previous visit which is on the left posterior lateral calf 08/20/2023: here for new wound on RLE anterior History of Present Illness (HPI) Admission 11/23/2018 This is a 47 year old man with type 2 diabetes. He tells [place] that about a week before Thanksgiving he noticed a small painful area on the back of his left mid calf. He saw a physician at urgent care on 10/10/2018 noted to have an open wound on the left calf. He was given Bactroban and Keflex as CandS was apparently negative. He saw his own primary doctor on 612/31/19 and was given Silvadene and doxycycline. He has been using Silvadene since then. He is saw his primary physician again on 1/10 was offered an Radio broadcast assistant but I do not think that he actually use this. The area in question is on the left posterior calf. There is some question that this was a insect bite and when he first presented but he certainly did not notice his any particular bite injury at that time. This did actually apparently at one point look as though it was progressing towards closure although it then it opened again. He has been using Silvadene for the past 3 weeks Past medical history; obstructive sleep apnea, Achilles tendinitis, hypertension, type 2 diabetes, cholecystectomy, fatty liver, ABI in this clinic was 1.38 on the left 11/30/2018; the area is essentially unchanged in size although the surface of the wound looks better. He has surrounding inflammation around the wound the exact etiology of this is unclear. 12/07/2018; the area has come down in diameter. Surface requires  debridement. We have been using silver collagen. Is the wound seems to have come down in size I have elected not to biopsy this. Surrounding inflammation is also less 2/11; the area continues to come down in size and generally looks healthy. We have been using silver collagen under compression surrounding inflammation is less there is no tenderness 2/18; the area continues to contract in size and looks healthy. We have been using silver collagen under compression 2/25; no major change in dimension this week. However the wound bed continues to look healthy and there appears to be less inflammation around the wound 3/3; the wound measures slightly smaller. Healthy looking surface. Less inflammation [name], [name] ([id number]) [id number] Page 5 of 7 3/10; wound is closed. Healthy looking surface. Still some discoloration around the wound area but absolutely no tenderness. The cause of this wound was never really determined. However it progressed nicely towards healing READMISSION 10/06/2019 This is a patient I dealt with with an unusual wound on the left posterior lateral calf in the early part of this year. I was never really completely sure I understood what this was in terms of causation. However wde use silver collagen under compression and the area responded nicely and we are able to discharge him after roughly 6 weeks in the clinic. He did not clearly have arterial or venous issues. I had some thoughts about biopsying this area however acid continued to get better I did not go through with this. He states that everything is been fine up until the last few weeks. His wife noted a scab with perhaps some purulent drainage. They called primary care and was prescribed doxy which she is finished and everything seems to have settled down there was no open area however the patient wanted [place] to look at this. READMISSION 08/20/2023 ***ABIs L: 1.04; R: 1.11*** This is a now  47 year old type II diabetic (last hemoglobin A1c 6.7%) who returns  with a new wound on his right lower extremity, anterior tibial surface. He says it began exactly the way he is previous wound that led to wound care center admission started. He was mowing the lawn and developed a bump on his leg. It subsequently broke down resulting in a large ulcer. Patient was seen in the emergency department on 04 August 2023. He was given a prescription for Keflex and Bactrim. He was referred to the wound care center for further evaluation and management. 08/28/2023: The wound measured a little bit larger today and has a nonviable surface. There is still an odor coming from the wound. Edema control is good. The culture that I took last week grew out a polymicrobial population but all organisms were at fairly low levels. 09/02/2023: The wound size was stable today, but it is markedly cleaner with just a little bit of slough and eschar around the edges. The odor has abated. He has had quite a bit of drainage. Edema control is excellent. 09/15/2023: The wound was a little bit smaller today and the surface is incredibly clean without any slough or eschar buildup. No odor or significant drainage. Edema control is excellent. 09/22/2023: The wound is smaller with healthy and robust granulation tissue filling in. Edema control remains excellent. 09/29/2023: The wound continues to contract. There is very minimal slough accumulation and the granulation tissue is healthy. Good edema control. 10/27/2023: The wound is smaller again today. The surface is extremely clean with healthy granulation tissue present. Good edema control. Objective Constitutional Hypertensive, asymptomatic. Slightly tachycardic. no acute distress. Vitals Time Taken: 9:32 AM, Height: 69 in, Weight: 330 lbs, BMI: 48.7, Temperature: 98.1 F, Pulse: 102 bpm, Respiratory Rate: 18 breaths/min, Blood Pressure: 169/107 mmHg. Respiratory Normal work of  breathing on room air.. General Notes: 10/27/2023: The wound is smaller again today. The surface is extremely clean with healthy granulation tissue present. Good edema control. Integumentary (Hair, Skin) Wound #2 status is Open. Original cause of wound was Bump. The date acquired was: 07/18/2023. The wound has been in treatment 9 weeks. The wound is located on the Right,Anterior Lower Leg. The wound measures 3cm length x 2.5cm width x 0.1cm depth; 5.89cm^2 area and 0.589cm^3 volume. There is Fat Layer (Subcutaneous Tissue) exposed. There is no tunneling or undermining noted. There is a medium amount of serosanguineous drainage noted. The wound margin is flat and intact. There is large (67-100%) red, friable granulation within the wound bed. There is no necrotic tissue within the wound bed. The periwound skin appearance had no abnormalities noted for texture. The periwound skin appearance had no abnormalities noted for moisture. The periwound skin appearance exhibited: Hemosiderin Staining. Periwound temperature was noted as No Abnormality. Assessment Active Problems ICD-10 Non-pressure chronic ulcer of other part of right lower leg with unspecified severity Type 2 diabetes mellitus with other skin ulcer Morbid (severe) obesity due to excess calories Procedures Wound #2 Pre-procedure diagnosis of Wound #2 is a Diabetic Wound/Ulcer of the Lower Extremity located on the Right,Anterior Lower Leg . There was a Three [name], [name] ([id number]) [id number] Page 6 of 7 Compression Therapy Procedure by Redmond Pulling, RN. Post procedure Diagnosis Wound #2: Same as Pre-Procedure Plan Follow-up Appointments: Return Appointment in 1 week. - Dr. Lady Gary Room 2 Anesthetic: (In clinic) Topical Lidocaine 4% applied to wound bed Bathing/ Shower/ Hygiene: May shower with protection but do not get wound dressing(s) wet. Protect dressing(s) with water repellant cover (for  example, large plastic bag) or a cast cover  and may then take shower. Edema Control - Orders / Instructions: Elevate legs to the level of the heart or above for 30 minutes daily and/or when sitting for 3-4 times a day throughout the day. Avoid standing for long periods of time. If compression wraps slide down please call wound center and speak with a nurse. The following medication(s) was prescribed: lidocaine topical 5 % ointment ointment topical once daily was prescribed at facility WOUND #2: - Lower Leg Wound Laterality: Right, Anterior Cleanser: Soap and Water 1 x Per Week/30 Days Discharge Instructions: May shower and wash wound with dial antibacterial soap and water prior to dressing change. Cleanser: Vashe 5.8 (oz) 1 x Per Week/30 Days Discharge Instructions: Cleanse the wound with Vashe prior to applying a clean dressing using gauze sponges, not tissue or cotton balls. Peri-Wound Care: Sween Lotion (Moisturizing lotion) 1 x Per Week/30 Days Discharge Instructions: Apply moisturizing lotion as directed Prim Dressing: Hydrofera Blue Ready Transfer Foam, 4x5 (in/in) 1 x Per Week/30 Days ary Discharge Instructions: Apply to wound bed as instructed Secondary Dressing: ABD Pad, 8x10 1 x Per Week/30 Days Discharge Instructions: Apply over primary dressing as directed. Com pression Wrap: Urgo K2 Lite, (equivalent to a 3 layer) two layer compression system, regular 1 x Per Week/30 Days Discharge Instructions: Apply Urgo K2 Lite as directed (alternative to 3 layer compression). 10/27/2023: The wound is smaller again today. The surface is extremely clean with healthy granulation tissue present. Good edema control. No debridement was necessary today. We will continue with Hydrofera Blue ready foam and Urgo light compression wrap. Follow-up in 1 week. Electronic Signature(s) Signed: 10/27/2023 10:30:31 AM By: Duanne Guess MD FACS Entered By: Duanne Guess on 10/27/2023  06:54:58 -------------------------------------------------------------------------------- SuperBill Details Patient Name: Date of Service: [name], [name] RD [name] 10/27/2023 Medical Record Number: [id number] Patient Account Number: [id number] Date of Birth/Sex: Treating RN: October 25, 1976 (47 y.o. M) Primary Care Provider: Richarda Blade Other Clinician: Referring Provider: Treating Provider/Extender: Jason Nest, Dinah Weeks in Treatment: 9 Diagnosis Coding ICD-10 Codes Code Description L97.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 with unspecified severity E11.622 Type 2 diabetes mellitus with other skin ulcer E66.01 Morbid (severe) obesity due to excess calories Facility Procedures Physician Procedures : CPT4 Code Description Modifier [id number] - WC PHYS LEVEL 3 - EST PT ICD-10 Diagnosis Description L97.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 with unspecified severity E11.622 Type 2 diabetes mellitus with other skin ulcer  E66.01 Morbid (severe) obesity due to excess calories Quantity: 1 Electronic Signature(s) Signed: 10/27/2023 10:30:31 AM By: Duanne Guess MD FACS Signed: 10/27/2023 12:06:25 PM By: Redmond Pulling RN, BSN Entered By: Redmond Pulling on 10/27/2023 07:02:35

## 2023-10-29 ENCOUNTER — Other Ambulatory Visit: Payer: Self-pay | Admitting: Family

## 2023-10-29 NOTE — Telephone Encounter (Signed)
Pharmacy requested refill.  Pended Rx and sent to Dinah for approval due to HIGH ALERT Warning.  

## 2023-11-03 ENCOUNTER — Encounter (HOSPITAL_BASED_OUTPATIENT_CLINIC_OR_DEPARTMENT_OTHER): Payer: BC Managed Care – PPO | Admitting: General Surgery

## 2023-11-03 ENCOUNTER — Ambulatory Visit: Payer: BC Managed Care – PPO | Admitting: Psychology

## 2023-11-03 DIAGNOSIS — Z6841 Body Mass Index (BMI) 40.0 and over, adult: Secondary | ICD-10-CM | POA: Diagnosis not present

## 2023-11-03 DIAGNOSIS — F4323 Adjustment disorder with mixed anxiety and depressed mood: Secondary | ICD-10-CM | POA: Diagnosis not present

## 2023-11-03 DIAGNOSIS — E11622 Type 2 diabetes mellitus with other skin ulcer: Secondary | ICD-10-CM | POA: Diagnosis not present

## 2023-11-03 DIAGNOSIS — G4733 Obstructive sleep apnea (adult) (pediatric): Secondary | ICD-10-CM | POA: Diagnosis not present

## 2023-11-03 DIAGNOSIS — L97812 Non-pressure chronic ulcer of other part of right lower leg with fat layer exposed: Secondary | ICD-10-CM | POA: Diagnosis not present

## 2023-11-03 DIAGNOSIS — I10 Essential (primary) hypertension: Secondary | ICD-10-CM | POA: Diagnosis not present

## 2023-11-03 DIAGNOSIS — K760 Fatty (change of) liver, not elsewhere classified: Secondary | ICD-10-CM | POA: Diagnosis not present

## 2023-11-03 NOTE — Progress Notes (Addendum)
 RODDY, BELLAMY [name] ([id number]) [id number] Page 1 of 7 Visit Report for 11/03/2023 Arrival Information Details Patient Name: Date of Service: [name], [place] RD [name] 11/03/2023 8:30 A M Medical Record Number: [id number] Patient Account Number: [id number] Date of Birth/Sex: Treating RN: 1976-07-25 (47 y.o. M) Primary Care Islam Villescas: [name] Other Clinician: Referring Saverio Kader: Treating Clay Menser/Extender: Marolyn Delon [name], Dinah Weeks in Treatment: 10 Visit Information History Since Last Visit Added or deleted any medications: No Patient Arrived: Ambulatory Any new allergies or adverse reactions: No Arrival Time: 08:35 Had a fall or experienced change in No Accompanied By: self activities of daily living that may affect Transfer Assistance: None risk of falls: Patient Identification Verified: Yes Signs or symptoms of abuse/neglect since last visito No Secondary Verification Process Completed: Yes Hospitalized since last visit: No Patient Requires Transmission-Based Precautions: No Implantable device outside of the clinic excluding No Patient Has Alerts: No cellular tissue based products placed in the center since last visit: Pain Present Now: No Electronic Signature(s) Signed: 11/03/2023 8:54:07 AM By: Okey Bonier Entered By: Okey Bonier on 11/03/2023 08:36:04 -------------------------------------------------------------------------------- Compression Therapy Details Patient Name: Date of Service: [name], [name] 11/03/2023 8:30 A M Medical Record Number: [id number] Patient Account Number: [id number] Date of Birth/Sex: Treating RN: April 22, 1976 (47 y.o. NETTY Claven Pollen Primary Care Mcadoo Muzquiz: [name] Other Clinician: Referring Kortne All: Treating Ammon Muscatello/Extender: Marolyn Delon [name], Dinah Weeks in Treatment: 10 Compression Therapy Performed for Wound Assessment: Wound #2 Right,Anterior Lower Leg Performed By: Clinician Claven Pollen, RN Compression Type: Three Layer Post Procedure Diagnosis Same as Pre-procedure Electronic Signature(s) Signed: 11/03/2023 4:52:13 PM By: Claven Pollen RN Entered By: Claven Pollen on 11/03/2023 09:20:32 Encounter Discharge Information Details -------------------------------------------------------------------------------- [name] ([id number]) [id number] Page 2 of 7 Patient Name: Date of Service: [name], [place] RD [name] 11/03/2023 8:30 A M Medical Record Number: [id number] Patient Account Number: [id number] Date of Birth/Sex: Treating RN: February 26, 1976 (47 y.o. NETTY Claven Pollen Primary Care Deaundre Allston: [name] Other Clinician: Referring Christia Coaxum: Treating Nakul Avino/Extender: Marolyn Delon [name] Devra in Treatment: 10 Encounter Discharge Information Items Post Procedure Vitals Discharge Condition: Stable Temperature (F): 97.9 Ambulatory Status: Ambulatory Pulse (bpm): 98 Discharge Destination: Home Respiratory Rate (breaths/min): 18 Transportation: Private Auto Blood Pressure (mmHg): 165/101 Accompanied By: self Schedule Follow-up Appointment: Yes Clinical Summary of Care: Patient Declined Electronic Signature(s) Signed: 11/03/2023 4:52:13 PM By: Claven Pollen RN Entered By: Claven Pollen on 11/03/2023 16:44:10 -------------------------------------------------------------------------------- Lower Extremity Assessment Details Patient Name: Date of Service: [name], [name] 11/03/2023 8:30 A M Medical Record Number: [id number] Patient Account Number: [id number] Date of Birth/Sex: Treating RN: 18-May-1976 (47 y.o. NETTY Claven Pollen Primary Care Karolyne Timmons: [name] Other Clinician: Referring Victorian Gunn: Treating Genesis Paget/Extender: Marolyn Delon [name], Dinah Weeks in Treatment: 10 Edema Assessment Assessed: [Left: No] [Right: No] [Left: Edema] [Right: :] Calf Left: Right: Point of Measurement: From Medial  Instep 39 cm Ankle Left: Right: Point of Measurement: From Medial Instep 26 cm Vascular Assessment Pulses: Dorsalis Pedis Palpable: [Right:Yes] Extremity colors, hair growth, and conditions: Extremity Color: [Right:Hyperpigmented] Hair Growth on Extremity: [Right:No] Temperature of Extremity: [Right:Warm] Capillary Refill: [Right:< 3 seconds] Dependent Rubor: [Right:No No] Electronic Signature(s) Signed: 11/03/2023 4:52:13 PM By: Claven Pollen RN Entered By: Claven Pollen on 11/03/2023 09:12:53 [name] ([id number]) [id number] Page 3 of 7 -------------------------------------------------------------------------------- Multi Wound Chart Details Patient Name: Date of Service: [name] N[place] RD [name] 11/03/2023 8:30 A M Medical Record Number: [id number] Patient Account Number: [id number] Date of Birth/Sex: Treating RN: 1976/07/25 (  47 y.o. M) Primary Care Neve Branscomb: [name] Other Clinician: Referring Drury Ardizzone: Treating Ruthel Martine/Extender: Marolyn Delon [name], Dinah Weeks in Treatment: 10 Vital Signs Height(in): 69 Pulse(bpm): 98 Weight(lbs): 330 Blood Pressure(mmHg): 165/101 Body Mass Index(BMI): 48.7 Temperature(F): 97.9 Respiratory Rate(breaths/min): 18 [2:Photos:] [N/A:N/A] Right, Anterior Lower Leg N/A N/A Wound Location: Bump N/A N/A Wounding Event: Diabetic Wound/Ulcer of the Lower N/A N/A Primary Etiology: Extremity Sleep Apnea, Hypertension, Type II N/A N/A Comorbid History: Diabetes 07/18/2023 N/A N/A Date Acquired: 10 N/A N/A Weeks of Treatment: Open N/A N/A Wound Status: No N/A N/A Wound Recurrence: 2.5x1.8x0.1 N/A N/A Measurements L x W x D (cm) 3.534 N/A N/A A (cm) : rea 0.353 N/A N/A Volume (cm) : 76.20% N/A N/A % Reduction in A rea: 76.20% N/A N/A % Reduction in Volume: Grade 2 N/A N/A Classification: Medium N/A N/A Exudate A mount: Serosanguineous N/A N/A Exudate Type: red, brown N/A N/A Exudate  Color: Flat and Intact N/A N/A Wound Margin: Large (67-100%) N/A N/A Granulation A mount: Red, Friable N/A N/A Granulation Quality: Small (1-33%) N/A N/A Necrotic A mount: Fat Layer (Subcutaneous Tissue): Yes N/A N/A Exposed Structures: Fascia: No Tendon: No Muscle: No Joint: No Bone: No Small (1-33%) N/A N/A Epithelialization: Debridement - Excisional N/A N/A Debridement: Pre-procedure Verification/Time Out 09:15 N/A N/A Taken: Lidocaine  4% T opical Solution N/A N/A Pain Control: Subcutaneous N/A N/A Tissue Debrided: Skin/Subcutaneous Tissue N/A N/A Level: 3.53 N/A N/A Debridement A (sq cm): rea Curette N/A N/A Instrument: Minimum N/A N/A Bleeding: Silver  Nitrate N/A N/A Hemostasis A chieved: Procedure was tolerated well N/A N/A Debridement Treatment Response: 2.5x1.8x0.1 N/A N/A Post Debridement Measurements L x W x D (cm) 0.353 N/A N/A Post Debridement Volume: (cm) No Abnormalities Noted N/A N/A Periwound Skin Texture: No Abnormalities Noted N/A N/A Periwound Skin Moisture: Hemosiderin Staining: Yes N/A N/A Periwound Skin ColorDIONE, [name] ([id number]) [id number] Page 4 of 7 No Abnormality N/A N/A Temperature: Compression Therapy N/A N/A Procedures Performed: Debridement Treatment Notes Electronic Signature(s) Signed: 11/03/2023 10:09:16 AM By: Marolyn Delon MD FACS Entered By: Marolyn Delon on 11/03/2023 09:35:32 -------------------------------------------------------------------------------- Multi-Disciplinary Care Plan Details Patient Name: Date of Service: [name], [name] 11/03/2023 8:30 A M Medical Record Number: [id number] Patient Account Number: [id number] Date of Birth/Sex: Treating RN: October 11, 1976 (47 y.o. NETTY Claven Pollen Primary Care Bryndan Bilyk: [name] Other Clinician: Referring Crystelle Ferrufino: Treating Ezella Kell/Extender: Marolyn Delon [name], Dinah Weeks in Treatment: 10 Active  Inactive Wound/Skin Impairment Nursing Diagnoses: Knowledge deficit related to ulceration/compromised skin integrity Goals: Patient/caregiver will verbalize understanding of skin care regimen Date Initiated: 08/20/2023 Target Resolution Date: 12/04/2023 Goal Status: Active Interventions: Assess patient/caregiver ability to obtain necessary supplies Assess patient/caregiver ability to perform ulcer/skin care regimen upon admission and as needed Assess ulceration(s) every visit Provide education on ulcer and skin care Screen for HBO Treatment Activities: Skin care regimen initiated : 08/20/2023 Topical wound management initiated : 08/20/2023 Notes: Electronic Signature(s) Signed: 11/03/2023 4:52:13 PM By: Claven Pollen RN Entered By: Claven Pollen on 11/03/2023 16:40:52 -------------------------------------------------------------------------------- Pain Assessment Details Patient Name: Date of Service: [name], [name] 11/03/2023 8:30 A M Medical Record Number: [id number] Patient Account Number: [id number] Date of Birth/Sex: Treating RN: 1976-02-20 (47 y.o. M) Primary Care Aemon Koeller: [name] Other Clinician: Referring Ishita Mcnerney: Treating Morty Ortwein/Extender: Marolyn Delon [name], Dinah Weeks in Treatment: 10 [place], [place] [name] ([id number]) [id number] Page 5 of 7 Active Problems Location of Pain Severity and Description of Pain Patient Has Paino Yes Site Locations Rate the pain. Current Pain Level:  2 Worst Pain Level: 10 Least Pain Level: 0 Tolerable Pain Level: 2 Character of Pain Describe the Pain: Aching, Dull Pain Management and Medication Current Pain Management: Electronic Signature(s) Signed: 11/03/2023 8:54:07 AM By: Okey Bonier Entered By: Okey Bonier on 11/03/2023 08:37:10 -------------------------------------------------------------------------------- Patient/Caregiver Education Details Patient Name: Date of Service: [name] 12/31/2024andnbsp8:30 A M Medical Record Number: [id number] Patient Account Number: [id number] Date of Birth/Gender: Treating RN: March 02, 1976 (47 y.o. NETTY Claven Pollen Primary Care Physician: [name], Dinah Other Clinician: Referring Physician: Treating Physician/Extender: Marolyn Delon [name] Devra in Treatment: 10 Education Assessment Education Provided To: Patient Education Topics Provided Wound/Skin Impairment: Methods: Explain/Verbal Responses: State content correctly Electronic Signature(s) Signed: 11/03/2023 4:52:13 PM By: Claven Pollen RN Entered By: Claven Pollen on 11/03/2023 16:41:40 [name] LOVING [name] ([id number]) [id number] Page 6 of 7 -------------------------------------------------------------------------------- Wound Assessment Details Patient Name: Date of Service: [name] 11/03/2023 8:30 A M Medical Record Number: [id number] Patient Account Number: [id number] Date of Birth/Sex: Treating RN: 1976/04/11 (47 y.o. M) Primary Care Argusta Mcgann: [name] Other Clinician: Referring Braxen Dobek: Treating Finesse Fielder/Extender: Marolyn Delon [name], Dinah Weeks in Treatment: 10 Wound Status Wound Number: 2 Primary Etiology: Diabetic Wound/Ulcer of the Lower Extremity Wound Location: Right, Anterior Lower Leg Wound Status: Open Wounding Event: Bump Comorbid History: Sleep Apnea, Hypertension, Type II Diabetes Date Acquired: 07/18/2023 Weeks Of Treatment: 10 Clustered Wound: No Photos Wound Measurements Length: (cm) 2.5 Width: (cm) 1.8 Depth: (cm) 0.1 Area: (cm) 3.534 Volume: (cm) 0.353 % Reduction in Area: 76.2% % Reduction in Volume: 76.2% Epithelialization: Small (1-33%) Tunneling: No Undermining: No Wound Description Classification: Grade 2 Wound Margin: Flat and Intact Exudate Amount: Medium Exudate Type: Serosanguineous Exudate Color: red, brown Foul Odor After Cleansing: No Slough/Fibrino  No Wound Bed Granulation Amount: Large (67-100%) Exposed Structure Granulation Quality: Red, Friable Fascia Exposed: No Necrotic Amount: Small (1-33%) Fat Layer (Subcutaneous Tissue) Exposed: Yes Necrotic Quality: Adherent Slough Tendon Exposed: No Muscle Exposed: No Joint Exposed: No Bone Exposed: No Periwound Skin Texture Texture Color No Abnormalities Noted: Yes No Abnormalities Noted: No Hemosiderin Staining: Yes Moisture No Abnormalities Noted: Yes Temperature / Pain Temperature: No Abnormality Treatment Notes Wound #2 (Lower Leg) Wound Laterality: Right, Anterior Cleanser Soap and Water [name], [name] ([id number]) [id number] Page 7 of 7 Discharge Instruction: May shower and wash wound with dial antibacterial soap and water prior to dressing change. Vashe 5.8 (oz) Discharge Instruction: Cleanse the wound with Vashe prior to applying a clean dressing using gauze sponges, not tissue or cotton balls. Peri-Wound Care Sween Lotion (Moisturizing lotion) Discharge Instruction: Apply moisturizing lotion as directed Topical Primary Dressing Hydrofera Blue Ready Transfer Foam, 4x5 (in/in) Discharge Instruction: Apply to wound bed as instructed Secondary Dressing ABD Pad, 8x10 Discharge Instruction: Apply over primary dressing as directed. Secured With Compression Wrap Urgo K2 Lite, (equivalent to a 3 layer) two layer compression system, regular Discharge Instruction: Apply Urgo K2 Lite as directed (alternative to 3 layer compression). Compression Stockings Add-Ons Electronic Signature(s) Signed: 11/03/2023 4:52:13 PM By: Claven Pollen RN Previous Signature: 11/03/2023 8:54:07 AM Version By: Okey Bonier Entered By: Claven Pollen on 11/03/2023 09:13:23 -------------------------------------------------------------------------------- Vitals Details Patient Name: Date of Service: [name] LITTIE, [name] 11/03/2023 8:30 A M Medical Record Number:  [id number] Patient Account Number: [id number] Date of Birth/Sex: Treating RN: 1975-11-10 (47 y.o. M) Primary Care Lazer Wollard: [name] Other Clinician: Referring Sahithi Ordoyne: Treating Jad Johansson/Extender: Marolyn Delon [name], Dinah Weeks in Treatment: 10 Vital Signs Time Taken: 08:36 Temperature (F): 97.9  Height (in): 69 Pulse (bpm): 98 Weight (lbs): 330 Respiratory Rate (breaths/min): 18 Body Mass Index (BMI): 48.7 Blood Pressure (mmHg): 165/101 Reference Range: 80 - 120 mg / dl Electronic Signature(s) Signed: 11/03/2023 8:54:07 AM By: Okey Bonier Entered By: Okey Bonier on 11/03/2023 08:36:46

## 2023-11-03 NOTE — Progress Notes (Signed)
 [facility] Counselor/Therapist Progress Note  Patient ID: [name], MRN: [id number],    Date: 11/03/2023  Time Spent: 45 mins; start time: 1300; end time: 1345  Treatment Type: Individual Therapy  Reported Symptoms: Pt presents for session via Caregility video.  Pt grants consent for session, stating he is in his home with no one else present; pt also shares that he understands the limitations of virtual session.  I shared with pt that I am in my office with no one else here either.  Mental Status Exam: Appearance:  Casual     Behavior: Appropriate  Motor: Normal  Speech/Language:  Clear and Coherent  Affect: Appropriate  Mood: normal  Thought process: normal  Thought content:   WNL  Sensory/Perceptual disturbances:   WNL  Orientation: oriented to person, place, and time/date  Attention: Good  Concentration: Good  Memory: WNL  Fund of knowledge:  Good  Insight:   Good  Judgment:  Good  Impulse Control: Good   Risk Assessment: Danger to Self:  No Self-injurious Behavior: No Danger to Others: No Duty to Warn:no Physical Aggression / Violence:No  Access to Firearms a concern: No  Gang Involvement:No   Subjective: Pt shares that, I had a nice holiday season; we went to my mom's for Thanksgiving dinner and we did mostly the same for Christmas.  I had to work half a day on Thanksgiving and I was off on Christmas and will be off tomorrow off as well.  Pt shares he has Tues, Wed, and Sat off in his current schedule.  Pt shares they got a new car for them since our last session since they were having trouble with their son's car and he needed dependable transportation for work.  Pt shares that his mom has a non aggressive form of breast cancer and she is having surgery next week for that.  She did not want everyone to know so she just to pt and his siblings.  Pt is taking 1/9 off to be present for her surgery.  Pt's wife is going through these holidays for the  first time without her dad who passed away earlier this year; she is sad but she managed OK.  She did not have the same level of Christmas energy that she normally has but we made it through [facility].  Pt has seen his PCP since our last session for a BP check; his next follow up is in Feb.  Pt shares that he saw the wound care doctor this morning and his BO was 166/100 and he knows that is too high.  His wound continues to heal little by little and he hopes it is healed soon.  Pt continues to work from home and that is better for pt than going to the office.  Pt shares that he did go to the [employer] [facility] [place]. [place] game a couple of weeks ago with his dad and his nephew and they had fun.  Encouraged pt to be intentional about engaging in his self care activities and we will meet in 2 weeks for a follow up session.  Interventions: Cognitive Behavioral Therapy  Diagnosis:Adjustment disorder with mixed anxiety and depressed mood  Plan: Treatment Plan Strengths/Abilities:  Intelligent, Intuitive, Willing to participate in therapy Treatment Preferences:  Outpatient Individual Therapy Statement of Needs:  Patient is to use CBT, mindfulness and coping skills to help manage and/or decrease symptoms associated with their diagnosis. Symptoms:  Depressed/Irritable mood, worry, social withdrawal Problems Addressed:  Depressive  thoughts, Sadness, Sleep issues, etc. Long Term Goals:  Pt to reduce overall level, frequency, and intensity of the feelings of depression/anxiety as evidenced by decreased irritability, negative self talk, and helpless feelings from 6 to 7 days/week to 0 to 1 days/week, per client report, for at least 3 consecutive months.  Progress: 30% Short Term Goals:  Pt to verbally express understanding of the relationship between feelings of depression/anxiety and their impact on thinking patterns and behaviors.  Pt to verbalize an understanding of the role that distorted thinking plays in creating fears,  excessive worry, and ruminations.  Progress: 30% Target Date:  04/24/2024 Frequency:  Bi-weekly Modality:  Cognitive Behavioral Therapy Interventions by Therapist:  Therapist will use CBT, Mindfulness exercises, Coping skills and Referrals, as needed by client. Client has verbally approved this treatment plan.  Francis KATHEE Macintosh, [facility]

## 2023-11-04 NOTE — Progress Notes (Signed)
 [name] [name] ([id number]) [id number] Page 1 of 8 Visit Report for 11/03/2023 Chief Complaint Document Details Patient Name: Date of Service: [name] [address] [name] 11/03/2023 8:30 A M Medical Record Number: [id number] Patient Account Number: [id number] Date of Birth/Sex: Treating RN: 10-05-1976 (47 y.o. M) Primary Care Provider: Leonarda Burdock Other Clinician: Referring Provider: Treating Provider/Extender: Marolyn Delon Leonarda, Dinah Weeks in Treatment: 10 Information Obtained from: Patient Chief Complaint 11/23/2018; patient is here for review of an ulcer on the left posterior calf 10/06/2019; patient is here for review of the wound area from the previous visit which is on the left posterior lateral calf 08/20/2023: here for new wound on RLE anterior Electronic Signature(s) Signed: 11/03/2023 10:09:16 AM By: Marolyn Delon MD FACS Entered By: Marolyn Delon on 11/03/2023 09:36:14 -------------------------------------------------------------------------------- Debridement Details Patient Name: Date of Service: [name], [name] 11/03/2023 8:30 A M Medical Record Number: [id number] Patient Account Number: [id number] Date of Birth/Sex: Treating RN: 27-Jul-1976 (47 y.o. [name] Primary Care Provider: Leonarda Burdock Other Clinician: Referring Provider: Treating Provider/Extender: Marolyn Delon Leonarda Burdock Devra in Treatment: 10 Debridement Performed for Assessment: Wound #2 Right,Anterior Lower Leg Performed By: Physician Marolyn Delon, MD The following information was scribed by: [name] The information was scribed for: Marolyn Delon Debridement Type: Debridement Severity of Tissue Pre Debridement: Fat layer exposed Level of Consciousness (Pre-procedure): Awake and Alert Pre-procedure Verification/Time Out Yes - 09:15 Taken: Start Time: 09:15 Pain Control: Lidocaine  4% T opical Solution Percent of Wound Bed Debrided:  100% T Area Debrided (cm): otal 3.53 Tissue and other material debrided: Viable, Non-Viable, Subcutaneous, Biofilm Level: Skin/Subcutaneous Tissue Debridement Description: Excisional Instrument: Curette Bleeding: Minimum Hemostasis Achieved: Silver  Nitrate Response to Treatment: Procedure was tolerated well Level of Consciousness (Post- Awake and Alert procedure): Post Debridement Measurements of Total Wound Length: (cm) 2.5 Width: (cm) 1.8 Depth: (cm) 0.1 Volume: (cm) 0.353 [name], [name] ([id number]) [id number] Page 2 of 8 Character of Wound/Ulcer Post Debridement: Improved Severity of Tissue Post Debridement: Fat layer exposed Post Procedure Diagnosis Same as Pre-procedure Electronic Signature(s) Signed: 11/03/2023 10:09:16 AM By: Marolyn Delon MD FACS Signed: 11/03/2023 4:52:13 PM By: [name] Pollen RN Entered By: [name] on 11/03/2023 09:21:49 -------------------------------------------------------------------------------- HPI Details Patient Name: Date of Service: [name], [name] 11/03/2023 8:30 A M Medical Record Number: [id number] Patient Account Number: [id number] Date of Birth/Sex: Treating RN: 06/15/1976 (47 y.o. M) Primary Care Provider: Leonarda Burdock Other Clinician: Referring Provider: Treating Provider/Extender: Marolyn Delon Leonarda, Dinah Weeks in Treatment: 10 History of Present Illness HPI Description: Admission 11/23/2018 This is a 48 year old man with type 2 diabetes. He tells us  that about a week before Thanksgiving he noticed a small painful area on the back of his left mid calf. He saw a physician at urgent care on 10/10/2018 noted to have an open wound on the left calf. He was given Bactroban  and Keflex  as CandS was apparently negative. He saw his own primary doctor on 612/31/19 and was given Silvadene  and doxycycline . He has been using Silvadene  since then. He is saw his primary physician again on 1/10 was  offered an Radio broadcast assistant but I do not think that he actually use this. The area in question is on the left posterior calf. There is some question that this was a insect bite and when he first presented but he certainly did not notice his any particular bite injury at that time. This did actually apparently at one point look as though it was progressing  towards closure although it then it opened again. He has been using Silvadene  for the past 3 weeks Past medical history; obstructive sleep apnea, Achilles tendinitis, hypertension, type 2 diabetes, cholecystectomy, fatty liver, ABI in this clinic was 1.38 on the left 11/30/2018; the area is essentially unchanged in size although the surface of the wound looks better. He has surrounding inflammation around the wound the exact etiology of this is unclear. 12/07/2018; the area has come down in diameter. Surface requires debridement. We have been using silver  collagen. Is the wound seems to have come down in size I have elected not to biopsy this. Surrounding inflammation is also less 2/11; the area continues to come down in size and generally looks healthy. We have been using silver  collagen under compression surrounding inflammation is less there is no tenderness 2/18; the area continues to contract in size and looks healthy. We have been using silver  collagen under compression 2/25; no major change in dimension this week. However the wound bed continues to look healthy and there appears to be less inflammation around the wound 3/3; the wound measures slightly smaller. Healthy looking surface. Less inflammation 3/10; wound is closed. Healthy looking surface. Still some discoloration around the wound area but absolutely no tenderness. The cause of this wound was never really determined. However it progressed nicely towards healing READMISSION 10/06/2019 This is a patient I dealt with with an unusual wound on the left posterior lateral calf in the early part of  this year. I was never really completely sure I understood what this was in terms of causation. However wde use silver  collagen under compression and the area responded nicely and we are able to discharge him after roughly 6 weeks in the clinic. He did not clearly have arterial or venous issues. I had some thoughts about biopsying this area however acid continued to get better I did not go through with this. He states that everything is been fine up until the last few weeks. His wife noted a scab with perhaps some purulent drainage. They called primary care and was prescribed doxy which she is finished and everything seems to have settled down there was no open area however the patient wanted us  to look at this. READMISSION 08/20/2023 ***ABIs L: 1.04; R: 1.11*** This is a now 48 year old type II diabetic (last hemoglobin A1c 6.7%) who returns with a new wound on his right lower extremity, anterior tibial surface. He says it began exactly the way he is previous wound that led to wound care center admission started. He was mowing the lawn and developed a bump on his leg. It subsequently broke down resulting in a large ulcer. Patient was seen in the emergency department on 04 August 2023. He was given a prescription for Keflex  and Bactrim . He was referred to the wound care center for further evaluation and management. 08/28/2023: The wound measured a little bit larger today and has a nonviable surface. There is still an odor coming from the wound. Edema control is good. The culture that I took last week grew out a polymicrobial population but all organisms were at fairly low levels. 09/02/2023: The wound size was stable today, but it is markedly cleaner with just a little bit of slough and eschar around the edges. The odor has abated. He has had quite a bit of drainage. Edema control is excellent. 09/15/2023: The wound was a little bit smaller today and the surface is incredibly clean without any  slough or eschar buildup. No odor  or significant drainage. Edema control is excellent. [name], [name] ([id number]) [id number] Page 3 of 8 09/22/2023: The wound is smaller with healthy and robust granulation tissue filling in. Edema control remains excellent. 09/29/2023: The wound continues to contract. There is very minimal slough accumulation and the granulation tissue is healthy. Good edema control. 10/27/2023: The wound is smaller again today. The surface is extremely clean with healthy granulation tissue present. Good edema control. 11/03/2023: Continued contraction of the wound. The surface is a little bit hyper granulated and friable. No concern for infection. Edema control is excellent. Electronic Signature(s) Signed: 11/03/2023 10:09:16 AM By: Marolyn Nest MD FACS Entered By: Marolyn Nest on 11/03/2023 09:36:47 -------------------------------------------------------------------------------- Physical Exam Details Patient Name: Date of Service: [name], [name] 11/03/2023 8:30 A M Medical Record Number: [id number] Patient Account Number: [id number] Date of Birth/Sex: Treating RN: 04/12/1976 (47 y.o. M) Primary Care Provider: Leonarda Burdock Other Clinician: Referring Provider: Treating Provider/Extender: Marolyn Nest Leonarda, Dinah Weeks in Treatment: 10 Constitutional Hypertensive, asymptomatic. . . . no acute distress. Respiratory Normal work of breathing on room air.. Notes 11/03/2023: Continued contraction of the wound. The surface is a little bit hyper granulated and friable. No concern for infection. Edema control is excellent. Electronic Signature(s) Signed: 11/03/2023 10:09:16 AM By: Marolyn Nest MD FACS Entered By: Marolyn Nest on 11/03/2023 09:37:59 -------------------------------------------------------------------------------- Physician Orders Details Patient Name: Date of Service: [name], [name] 11/03/2023 8:30 A  M Medical Record Number: [id number] Patient Account Number: [id number] Date of Birth/Sex: Treating RN: 08-18-1976 (47 y.o. [name] Primary Care Provider: Leonarda Burdock Other Clinician: Referring Provider: Treating Provider/Extender: Marolyn Nest Leonarda, Dinah Weeks in Treatment: 10 Verbal / Phone Orders: No Diagnosis Coding ICD-10 Coding Code Description L97.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 with unspecified severity E11.622 Type 2 diabetes mellitus with other skin ulcer E66.01 Morbid (severe) obesity due to excess calories Follow-up Appointments ppointment in 1 week. - Dr. Marolyn Room 2 11/10/23 at 9:15am Return A Anesthetic (In clinic) Topical Lidocaine  4% applied to wound bed [name], [name] ([id number]) [phone].pdf Page 4 of 8 Bathing/ Shower/ Hygiene May shower with protection but do not get wound dressing(s) wet. Protect dressing(s) with water repellant cover (for example, large plastic bag) or a cast cover and may then take shower. Edema Control - Orders / Instructions Elevate legs to the level of the heart or above for 30 minutes daily and/or when sitting for 3-4 times a day throughout the day. A void standing for long periods of time. If compression wraps slide down please call wound center and speak with a nurse. Wound Treatment Wound #2 - Lower Leg Wound Laterality: Right, Anterior Cleanser: Soap and Water 1 x Per Week/30 Days Discharge Instructions: May shower and wash wound with dial antibacterial soap and water prior to dressing change. Cleanser: Vashe 5.8 (oz) 1 x Per Week/30 Days Discharge Instructions: Cleanse the wound with Vashe prior to applying a clean dressing using gauze sponges, not tissue or cotton balls. Peri-Wound Care: Sween Lotion (Moisturizing lotion) 1 x Per Week/30 Days Discharge Instructions: Apply moisturizing lotion as directed Prim Dressing: Hydrofera Blue Ready Transfer Foam,  4x5 (in/in) 1 x Per Week/30 Days ary Discharge Instructions: Apply to wound bed as instructed Secondary Dressing: ABD Pad, 8x10 1 x Per Week/30 Days Discharge Instructions: Apply over primary dressing as directed. Compression Wrap: Urgo K2 Lite, (equivalent to a 3 layer) two layer compression system, regular 1 x Per Week/30 Days Discharge Instructions:  Apply Urgo K2 Lite as directed (alternative to 3 layer compression). Electronic Signature(s) Signed: 11/03/2023 10:09:16 AM By: Marolyn Nest MD FACS Entered By: Marolyn Nest on 11/03/2023 09:38:14 -------------------------------------------------------------------------------- Problem List Details Patient Name: Date of Service: [name], [name] 11/03/2023 8:30 A M Medical Record Number: [id number] Patient Account Number: [id number] Date of Birth/Sex: Treating RN: [birth year] (47 y.o. M) Primary Care Provider: Leonarda Burdock Other Clinician: Referring Provider: Treating Provider/Extender: Marolyn Nest Leonarda, Dinah Weeks in Treatment: 10 Active Problems ICD-10 Encounter Code Description Active Date MDM Diagnosis L97.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 with unspecified 08/20/2023 No Yes severity E11.622 Type 2 diabetes mellitus with other skin ulcer 08/20/2023 No Yes E66.01 Morbid (severe) obesity due to excess calories 08/20/2023 No Yes Inactive Problems Resolved Problems [name], [name] ([id number]) [phone].pdf Page 5 of 8 Electronic Signature(s) Signed: 11/03/2023 10:09:16 AM By: Marolyn Nest MD FACS Entered By: Marolyn Nest on 11/03/2023 09:35:08 -------------------------------------------------------------------------------- Progress Note Details Patient Name: Date of Service: [name], [name] 11/03/2023 8:30 A M Medical Record Number: [id number] Patient Account Number: [id number] Date of Birth/Sex: Treating RN: Feb 22, 1976 (47 y.o. M) Primary Care  Provider: Leonarda Burdock Other Clinician: Referring Provider: Treating Provider/Extender: Marolyn Nest Leonarda, Dinah Weeks in Treatment: 10 Subjective Chief Complaint Information obtained from Patient 11/23/2018; patient is here for review of an ulcer on the left posterior calf 10/06/2019; patient is here for review of the wound area from the previous visit which is on the left posterior lateral calf 08/20/2023: here for new wound on RLE anterior History of Present Illness (HPI) Admission 11/23/2018 This is a 48 year old man with type 2 diabetes. He tells us  that about a week before Thanksgiving he noticed a small painful area on the back of his left mid calf. He saw a physician at urgent care on 10/10/2018 noted to have an open wound on the left calf. He was given Bactroban  and Keflex  as CandS was apparently negative. He saw his own primary doctor on 612/31/19 and was given Silvadene  and doxycycline . He has been using Silvadene  since then. He is saw his primary physician again on 1/10 was offered an Radio broadcast assistant but I do not think that he actually use this. The area in question is on the left posterior calf. There is some question that this was a insect bite and when he first presented but he certainly did not notice his any particular bite injury at that time. This did actually apparently at one point look as though it was progressing towards closure although it then it opened again. He has been using Silvadene  for the past 3 weeks Past medical history; obstructive sleep apnea, Achilles tendinitis, hypertension, type 2 diabetes, cholecystectomy, fatty liver, ABI in this clinic was 1.38 on the left 11/30/2018; the area is essentially unchanged in size although the surface of the wound looks better. He has surrounding inflammation around the wound the exact etiology of this is unclear. 12/07/2018; the area has come down in diameter. Surface requires debridement. We have been using silver  collagen.  Is the wound seems to have come down in size I have elected not to biopsy this. Surrounding inflammation is also less 2/11; the area continues to come down in size and generally looks healthy. We have been using silver  collagen under compression surrounding inflammation is less there is no tenderness 2/18; the area continues to contract in size and looks healthy. We have been using silver  collagen under compression 2/25; no major change  in dimension this week. However the wound bed continues to look healthy and there appears to be less inflammation around the wound 3/3; the wound measures slightly smaller. Healthy looking surface. Less inflammation 3/10; wound is closed. Healthy looking surface. Still some discoloration around the wound area but absolutely no tenderness. The cause of this wound was never really determined. However it progressed nicely towards healing READMISSION 10/06/2019 This is a patient I dealt with with an unusual wound on the left posterior lateral calf in the early part of this year. I was never really completely sure I understood what this was in terms of causation. However wde use silver  collagen under compression and the area responded nicely and we are able to discharge him after roughly 6 weeks in the clinic. He did not clearly have arterial or venous issues. I had some thoughts about biopsying this area however acid continued to get better I did not go through with this. He states that everything is been fine up until the last few weeks. His wife noted a scab with perhaps some purulent drainage. They called primary care and was prescribed doxy which she is finished and everything seems to have settled down there was no open area however the patient wanted us  to look at this. READMISSION 08/20/2023 ***ABIs L: 1.04; R: 1.11*** This is a now 48 year old type II diabetic (last hemoglobin A1c 6.7%) who returns with a new wound on his right lower extremity, anterior tibial  surface. He says it began exactly the way he is previous wound that led to wound care center admission started. He was mowing the lawn and developed a bump on his leg. It subsequently broke down resulting in a large ulcer. Patient was seen in the emergency department on 04 August 2023. He was given a prescription for Keflex  and Bactrim . He was referred to the wound care center for further evaluation and management. 08/28/2023: The wound measured a little bit larger today and has a nonviable surface. There is still an odor coming from the wound. Edema control is good. The culture that I took last week grew out a polymicrobial population but all organisms were at fairly low levels. 09/02/2023: The wound size was stable today, but it is markedly cleaner with just a little bit of slough and eschar around the edges. The odor has abated. He has had quite a bit of drainage. Edema control is excellent. 09/15/2023: The wound was a little bit smaller today and the surface is incredibly clean without any slough or eschar buildup. No odor or significant drainage. Edema control is excellent. 09/22/2023: The wound is smaller with healthy and robust granulation tissue filling in. Edema control remains excellent. 09/29/2023: The wound continues to contract. There is very minimal slough accumulation and the granulation tissue is healthy. Good edema control. [name], [name] ([id number]) [id number] Page 6 of 8 10/27/2023: The wound is smaller again today. The surface is extremely clean with healthy granulation tissue present. Good edema control. 11/03/2023: Continued contraction of the wound. The surface is a little bit hyper granulated and friable. No concern for infection. Edema control is excellent. Objective Constitutional Hypertensive, asymptomatic. no acute distress. Vitals Time Taken: 8:36 AM, Height: 69 in, Weight: 330 lbs, BMI: 48.7, Temperature: 97.9 F, Pulse: 98 bpm, Respiratory  Rate: 18 breaths/min, Blood Pressure: 165/101 mmHg. Respiratory Normal work of breathing on room air.. General Notes: 11/03/2023: Continued contraction of the wound. The surface is a little bit hyper granulated and friable. No concern for infection. Edema  control is excellent. Integumentary (Hair, Skin) Wound #2 status is Open. Original cause of wound was Bump. The date acquired was: 07/18/2023. The wound has been in treatment 10 weeks. The wound is located on the Right,Anterior Lower Leg. The wound measures 2.5cm length x 1.8cm width x 0.1cm depth; 3.534cm^2 area and 0.353cm^3 volume. There is Fat Layer (Subcutaneous Tissue) exposed. There is no tunneling or undermining noted. There is a medium amount of serosanguineous drainage noted. The wound margin is flat and intact. There is large (67-100%) red, friable granulation within the wound bed. There is a small (1-33%) amount of necrotic tissue within the wound bed including Adherent Slough. The periwound skin appearance had no abnormalities noted for texture. The periwound skin appearance had no abnormalities noted for moisture. The periwound skin appearance exhibited: Hemosiderin Staining. Periwound temperature was noted as No Abnormality. Assessment Active Problems ICD-10 Non-pressure chronic ulcer of other part of right lower leg with unspecified severity Type 2 diabetes mellitus with other skin ulcer Morbid (severe) obesity due to excess calories Procedures Wound #2 Pre-procedure diagnosis of Wound #2 is a Diabetic Wound/Ulcer of the Lower Extremity located on the Right,Anterior Lower Leg .Severity of Tissue Pre Debridement is: Fat layer exposed. There was a Excisional Skin/Subcutaneous Tissue Debridement with a total area of 3.53 sq cm performed by Marolyn Nest, MD. With the following instrument(s): Curette to remove Viable and Non-Viable tissue/material. Material removed includes Subcutaneous Tissue and Biofilm and after achieving  pain control using Lidocaine  4% Topical Solution. No specimens were taken. A time out was conducted at 09:15, prior to the start of the procedure. A Minimum amount of bleeding was controlled with Silver  Nitrate. The procedure was tolerated well. Post Debridement Measurements: 2.5cm length x 1.8cm width x 0.1cm depth; 0.353cm^3 volume. Character of Wound/Ulcer Post Debridement is improved. Severity of Tissue Post Debridement is: Fat layer exposed. Post procedure Diagnosis Wound #2: Same as Pre-Procedure Pre-procedure diagnosis of Wound #2 is a Diabetic Wound/Ulcer of the Lower Extremity located on the Right,Anterior Lower Leg . There was a Three Layer Compression Therapy Procedure by [name] Pollen, RN. Post procedure Diagnosis Wound #2: Same as Pre-Procedure Plan Follow-up Appointments: Return Appointment in 1 week. - Dr. Marolyn Room 2 11/10/23 at 9:15am Anesthetic: (In clinic) Topical Lidocaine  4% applied to wound bed Bathing/ Shower/ Hygiene: May shower with protection but do not get wound dressing(s) wet. Protect dressing(s) with water repellant cover (for example, large plastic bag) or a cast cover and may then take shower. Edema Control - Orders / Instructions: Elevate legs to the level of the heart or above for 30 minutes daily and/or when sitting for 3-4 times a day throughout the day. [name], [name] ([id number]) [id number] Page 7 of 8 Avoid standing for long periods of time. If compression wraps slide down please call wound center and speak with a nurse. WOUND #2: - Lower Leg Wound Laterality: Right, Anterior Cleanser: Soap and Water 1 x Per Week/30 Days Discharge Instructions: May shower and wash wound with dial antibacterial soap and water prior to dressing change. Cleanser: Vashe 5.8 (oz) 1 x Per Week/30 Days Discharge Instructions: Cleanse the wound with Vashe prior to applying a clean dressing using gauze sponges, not tissue or cotton  balls. Peri-Wound Care: Sween Lotion (Moisturizing lotion) 1 x Per Week/30 Days Discharge Instructions: Apply moisturizing lotion as directed Prim Dressing: Hydrofera Blue Ready Transfer Foam, 4x5 (in/in) 1 x Per Week/30 Days ary Discharge Instructions: Apply to wound bed as instructed Secondary Dressing: ABD Pad,  8x10 1 x Per Week/30 Days Discharge Instructions: Apply over primary dressing as directed. Com pression Wrap: Urgo K2 Lite, (equivalent to a 3 layer) two layer compression system, regular 1 x Per Week/30 Days Discharge Instructions: Apply Urgo K2 Lite as directed (alternative to 3 layer compression). 11/03/2023: Continued contraction of the wound. The surface is a little bit hyper granulated and friable. No concern for infection. Edema control is excellent. I used a curette to debride slough and subcutaneous tissue from the wound. I then chemically cauterized the hypertrophic granulation tissue with silver  nitrate. We will continue Hydrofera Blue with Urgo light compression. Follow-up in 1 week. Electronic Signature(s) Signed: 11/03/2023 10:09:16 AM By: Marolyn Nest MD FACS Entered By: Marolyn Nest on 11/03/2023 09:38:43 -------------------------------------------------------------------------------- SuperBill Details Patient Name: Date of Service: [name], [name] 11/03/2023 Medical Record Number: [id number] Patient Account Number: [id number] Date of Birth/Sex: Treating RN: 1976/07/05 (47 y.o. 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 E11.622 Type 2 diabetes mellitus with other skin ulcer E66.01 Morbid (severe) obesity due  to excess calories Modifier: verity Quantity: 1 Physician Procedures : CPT4 Code Description Modifier [id number] 11042 - WC PHYS SUBQ TISS 20 SQ CM ICD-10 Diagnosis Description L97.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 with unspecified severity E11.622 Type 2 diabetes mellitus with other skin ulcer  E66.01 Morbid (severe) obesity due to excess calories Quantity: 1 Electronic Signature(s) Signed: 11/03/2023 10:09:16 AM By: Marolyn Nest MD [name] ([id number]) [id number] Page 8 of 8 Signed: 11/03/2023 10:09:16 AM By: Marolyn Nest MD FACS Entered By: Marolyn Nest on 11/03/2023 09:38:55

## 2023-11-10 ENCOUNTER — Encounter (HOSPITAL_BASED_OUTPATIENT_CLINIC_OR_DEPARTMENT_OTHER): Payer: BC Managed Care – PPO | Attending: General Surgery | Admitting: General Surgery

## 2023-11-10 DIAGNOSIS — E11622 Type 2 diabetes mellitus with other skin ulcer: Secondary | ICD-10-CM | POA: Diagnosis not present

## 2023-11-10 DIAGNOSIS — Z6841 Body Mass Index (BMI) 40.0 and over, adult: Secondary | ICD-10-CM | POA: Insufficient documentation

## 2023-11-10 DIAGNOSIS — L97812 Non-pressure chronic ulcer of other part of right lower leg with fat layer exposed: Secondary | ICD-10-CM | POA: Diagnosis not present

## 2023-11-11 NOTE — Progress Notes (Signed)
 [name], SIEVER [name] ([id number]) [id number] Page 1 of 8 Visit Report for 11/10/2023 Chief Complaint Document Details Patient Name: Date of Service: [name] [address] [name] 11/10/2023 9:15 A M Medical Record Number: [id number] Patient Account Number: [id number] Date of Birth/Sex: Treating RN: 11/07/1975 (47 y.o. M) Primary Care Provider: Leonarda Burdock Other Clinician: Referring Provider: Treating Provider/Extender: Marolyn Delon Leonarda, Dinah Weeks in Treatment: 11 Information Obtained from: Patient Chief Complaint 11/23/2018; patient is here for review of an ulcer on the left posterior calf 10/06/2019; patient is here for review of the wound area from the previous visit which is on the left posterior lateral calf 08/20/2023: here for new wound on RLE anterior Electronic Signature(s) Signed: 11/10/2023 10:06:46 AM By: Marolyn Delon MD FACS Entered By: Marolyn Delon on 11/10/2023 10:06:46 -------------------------------------------------------------------------------- Debridement Details Patient Name: Date of Service: [name], [name] 11/10/2023 9:15 A M Medical Record Number: [id number] Patient Account Number: [id number] Date of Birth/Sex: Treating RN: Mar 16, 1976 (47 y.o. NETTY [name] Primary Care Provider: [name], Dinah Other Clinician: Referring Provider: Treating Provider/Extender: Marolyn Delon Leonarda Burdock Devra in Treatment: 11 Debridement Performed for Assessment: Wound #2 Right,Anterior Lower Leg Performed By: Physician Marolyn Delon, MD The following information was scribed by: [name] The information was scribed for: Marolyn Delon Debridement Type: Debridement Severity of Tissue Pre Debridement: Fat layer exposed Level of Consciousness (Pre-procedure): Awake and Alert Pre-procedure Verification/Time Out Yes - 09:45 Taken: Start Time: 09:47 Pain Control: Lidocaine  4% T opical Solution Percent of Wound Bed Debrided: 100% T  Area Debrided (cm): otal 1.13 Tissue and other material debrided: Viable, Non-Viable, Slough, Subcutaneous, Slough Level: Skin/Subcutaneous Tissue Debridement Description: Excisional Instrument: Curette Bleeding: Minimum Hemostasis Achieved: Pressure Procedural Pain: 0 Post Procedural Pain: 0 Response to Treatment: Procedure was tolerated well Level of Consciousness (Post- Awake and Alert procedure): Post Debridement Measurements of Total Wound Length: (cm) 1.8 Width: (cm) 0.8 [name], Arvo [name] ([id number]) [id number] Page 2 of 8 Depth: (cm) 0.1 Volume: (cm) 0.113 Character of Wound/Ulcer Post Debridement: Improved Severity of Tissue Post Debridement: Fat layer exposed Post Procedure Diagnosis Same as Pre-procedure Electronic Signature(s) Signed: 11/10/2023 12:28:17 PM By: Marolyn Delon MD FACS Signed: 11/10/2023 5:04:29 PM By: [name] Handing RN, BSN Entered By: [name] on 11/10/2023 09:50:01 -------------------------------------------------------------------------------- HPI Details Patient Name: Date of Service: [name], [name] 11/10/2023 9:15 A M Medical Record Number: [id number] Patient Account Number: [id number] Date of Birth/Sex: Treating RN: 26-Feb-1976 (47 y.o. M) Primary Care Provider: Leonarda Burdock Other Clinician: Referring Provider: Treating Provider/Extender: Marolyn Delon Leonarda, Dinah Weeks in Treatment: 11 History of Present Illness HPI Description: Admission 11/23/2018 This is a 48 year old man with type 2 diabetes. He tells us  that about a week before Thanksgiving he noticed a small painful area on the back of his left mid calf. He saw a physician at urgent care on 10/10/2018 noted to have an open wound on the left calf. He was given Bactroban  and Keflex  as CandS was apparently negative. He saw his own primary doctor on 612/31/19 and was given Silvadene  and doxycycline . He has been using Silvadene  since then. He is saw his  primary physician again on 1/10 was offered an Radio broadcast assistant but I do not think that he actually use this. The area in question is on the left posterior calf. There is some question that this was a insect bite and when he first presented but he certainly did not notice his any particular bite injury at that time. This did actually apparently at  one point look as though it was progressing towards closure although it then it opened again. He has been using Silvadene  for the past 3 weeks Past medical history; obstructive sleep apnea, Achilles tendinitis, hypertension, type 2 diabetes, cholecystectomy, fatty liver, ABI in this clinic was 1.38 on the left 11/30/2018; the area is essentially unchanged in size although the surface of the wound looks better. He has surrounding inflammation around the wound the exact etiology of this is unclear. 12/07/2018; the area has come down in diameter. Surface requires debridement. We have been using silver  collagen. Is the wound seems to have come down in size I have elected not to biopsy this. Surrounding inflammation is also less 2/11; the area continues to come down in size and generally looks healthy. We have been using silver  collagen under compression surrounding inflammation is less there is no tenderness 2/18; the area continues to contract in size and looks healthy. We have been using silver  collagen under compression 2/25; no major change in dimension this week. However the wound bed continues to look healthy and there appears to be less inflammation around the wound 3/3; the wound measures slightly smaller. Healthy looking surface. Less inflammation 3/10; wound is closed. Healthy looking surface. Still some discoloration around the wound area but absolutely no tenderness. The cause of this wound was never really determined. However it progressed nicely towards healing READMISSION 10/06/2019 This is a patient I dealt with with an unusual wound on the left  posterior lateral calf in the early part of this year. I was never really completely sure I understood what this was in terms of causation. However wde use silver  collagen under compression and the area responded nicely and we are able to discharge him after roughly 6 weeks in the clinic. He did not clearly have arterial or venous issues. I had some thoughts about biopsying this area however acid continued to get better I did not go through with this. He states that everything is been fine up until the last few weeks. His wife noted a scab with perhaps some purulent drainage. They called primary care and was prescribed doxy which she is finished and everything seems to have settled down there was no open area however the patient wanted us  to look at this. READMISSION 08/20/2023 ***ABIs L: 1.04; R: 1.11*** This is a now 48 year old type II diabetic (last hemoglobin A1c 6.7%) who returns with a new wound on his right lower extremity, anterior tibial surface. He says it began exactly the way he is previous wound that led to wound care center admission started. He was mowing the lawn and developed a bump on his leg. It subsequently broke down resulting in a large ulcer. Patient was seen in the emergency department on 04 August 2023. He was given a prescription for Keflex  and Bactrim . He was referred to the wound care center for further evaluation and management. 08/28/2023: The wound measured a little bit larger today and has a nonviable surface. There is still an odor coming from the wound. Edema control is good. The culture that I took last week grew out a polymicrobial population but all organisms were at fairly low levels. 09/02/2023: The wound size was stable today, but it is markedly cleaner with just a little bit of slough and eschar around the edges. The odor has abated. He has had quite a bit of drainage. Edema control is excellent. ADRIANO, BISCHOF [name] ([id number])  [id number] Page 3 of 8 09/15/2023: The wound was a little  bit smaller today and the surface is incredibly clean without any slough or eschar buildup. No odor or significant drainage. Edema control is excellent. 09/22/2023: The wound is smaller with healthy and robust granulation tissue filling in. Edema control remains excellent. 09/29/2023: The wound continues to contract. There is very minimal slough accumulation and the granulation tissue is healthy. Good edema control. 10/27/2023: The wound is smaller again today. The surface is extremely clean with healthy granulation tissue present. Good edema control. 11/03/2023: Continued contraction of the wound. The surface is a little bit hyper granulated and friable. No concern for infection. Edema control is excellent. 11/10/2023: The wound is smaller again today. There is a little bit of slough on the surface and the tissue remains slightly hyper granulated, but it is not particularly friable. Edema control remains excellent. Electronic Signature(s) Signed: 11/10/2023 10:07:17 AM By: Marolyn Nest MD FACS Entered By: Marolyn Nest on 11/10/2023 10:07:17 -------------------------------------------------------------------------------- Physical Exam Details Patient Name: Date of Service: [name], [name] 11/10/2023 9:15 A M Medical Record Number: [id number] Patient Account Number: [id number] Date of Birth/Sex: Treating RN: [birth year] (47 y.o. M) Primary Care Provider: Leonarda Burdock Other Clinician: Referring Provider: Treating Provider/Extender: Marolyn Nest Leonarda, Dinah Weeks in Treatment: 11 Constitutional Hypertensive, asymptomatic. Slightly tachycardic. . . no acute distress. Respiratory Normal work of breathing on room air.. Notes 11/10/2023: The wound is smaller again today. There is a little bit of slough on the surface and the tissue remains slightly hyper granulated, but it is not particularly friable.  Edema control remains excellent. Electronic Signature(s) Signed: 11/10/2023 10:07:51 AM By: Marolyn Nest MD FACS Entered By: Marolyn Nest on 11/10/2023 10:07:51 -------------------------------------------------------------------------------- Physician Orders Details Patient Name: Date of Service: [name], [name] 11/10/2023 9:15 A M Medical Record Number: [id number] Patient Account Number: [id number] Date of Birth/Sex: Treating RN: 1976/02/26 (47 y.o. NETTY [name] Primary Care Provider: [name], Dinah Other Clinician: Referring Provider: Treating Provider/Extender: Marolyn Nest Leonarda Burdock Devra in Treatment: 11 The following information was scribed by: [name] The information was scribed for: Marolyn Nest Verbal / Phone Orders: No Diagnosis Coding ICD-10 Coding Code Description L97.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 with unspecified severity E11.622 Type 2 diabetes mellitus with other skin ulcer [place], Murrel [name] ([id number]) [id number] Page 4 of 8 E66.01 Morbid (severe) obesity due to excess calories Follow-up Appointments ppointment in 1 week. - Dr. Marolyn 11/17/23 at 9:15 am Return A Anesthetic (In clinic) Topical Lidocaine  4% applied to wound bed Bathing/ Shower/ Hygiene May shower with protection but do not get wound dressing(s) wet. Protect dressing(s) with water repellant cover (for example, large plastic bag) or a cast cover and may then take shower. Edema Control - Orders / Instructions Elevate legs to the level of the heart or above for 30 minutes daily and/or when sitting for 3-4 times a day throughout the day. A void standing for long periods of time. Exercise regularly If compression wraps slide down please call wound center and speak with a nurse. Wound Treatment Wound #2 - Lower Leg Wound Laterality: Right, Anterior Cleanser: Soap and Water 1 x Per Week/30 Days Discharge  Instructions: May shower and wash wound with dial antibacterial soap and water prior to dressing change. Cleanser: Vashe 5.8 (oz) 1 x Per Week/30 Days Discharge Instructions: Cleanse the wound with Vashe prior to applying a clean dressing using gauze sponges, not tissue or cotton balls. Peri-Wound Care: Sween Lotion (Moisturizing lotion) 1 x Per Week/30 Days Discharge  Instructions: Apply moisturizing lotion as directed Prim Dressing: Hydrofera Blue Ready Transfer Foam, 4x5 (in/in) 1 x Per Week/30 Days ary Discharge Instructions: Apply to wound bed as instructed Secondary Dressing: Woven Gauze Sponge, Non-Sterile 4x4 in 1 x Per Week/30 Days Discharge Instructions: Apply over primary dressing as directed. Compression Wrap: Urgo K2 Lite, (equivalent to a 3 layer) two layer compression system, regular 1 x Per Week/30 Days Discharge Instructions: Apply Urgo K2 Lite as directed (alternative to 3 layer compression). Electronic Signature(s) Signed: 11/10/2023 12:28:17 PM By: Marolyn Nest MD FACS Entered By: Marolyn Nest on 11/10/2023 10:08:29 -------------------------------------------------------------------------------- Problem List Details Patient Name: Date of Service: [name], [name] 11/10/2023 9:15 A M Medical Record Number: [id number] Patient Account Number: [id number] Date of Birth/Sex: Treating RN: December 28, 1975 (47 y.o. NETTY [name] Primary Care Provider: [name], Dinah Other Clinician: Referring Provider: Treating Provider/Extender: Marolyn Nest Leonarda Roxan Devra in Treatment: 11 Active Problems ICD-10 Encounter Code Description Active Date MDM Diagnosis L97.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 with unspecified 08/20/2023 No Yes severity E11.622 Type 2 diabetes mellitus with other skin ulcer 08/20/2023 No Yes E66.01 Morbid (severe) obesity due to excess calories 08/20/2023 No Yes FINAS, DELONE [name] ([id number])  [id number] Page 5 of 8 Inactive Problems Resolved Problems Electronic Signature(s) Signed: 11/10/2023 10:06:33 AM By: Marolyn Nest MD FACS Entered By: Marolyn Nest on 11/10/2023 10:06:33 -------------------------------------------------------------------------------- Progress Note Details Patient Name: Date of Service: [name], [name] 11/10/2023 9:15 A M Medical Record Number: [id number] Patient Account Number: [id number] Date of Birth/Sex: Treating RN: 05-04-1976 (47 y.o. M) Primary Care Provider: Leonarda Roxan Other Clinician: Referring Provider: Treating Provider/Extender: Marolyn Nest Leonarda, Dinah Weeks in Treatment: 11 Subjective Chief Complaint Information obtained from Patient 11/23/2018; patient is here for review of an ulcer on the left posterior calf 10/06/2019; patient is here for review of the wound area from the previous visit which is on the left posterior lateral calf 08/20/2023: here for new wound on RLE anterior History of Present Illness (HPI) Admission 11/23/2018 This is a 48 year old man with type 2 diabetes. He tells us  that about a week before Thanksgiving he noticed a small painful area on the back of his left mid calf. He saw a physician at urgent care on 10/10/2018 noted to have an open wound on the left calf. He was given Bactroban  and Keflex  as CandS was apparently negative. He saw his own primary doctor on 612/31/19 and was given Silvadene  and doxycycline . He has been using Silvadene  since then. He is saw his primary physician again on 1/10 was offered an Radio broadcast assistant but I do not think that he actually use this. The area in question is on the left posterior calf. There is some question that this was a insect bite and when he first presented but he certainly did not notice his any particular bite injury at that time. This did actually apparently at one point look as though it was progressing towards closure although it then it  opened again. He has been using Silvadene  for the past 3 weeks Past medical history; obstructive sleep apnea, Achilles tendinitis, hypertension, type 2 diabetes, cholecystectomy, fatty liver, ABI in this clinic was 1.38 on the left 11/30/2018; the area is essentially unchanged in size although the surface of the wound looks better. He has surrounding inflammation around the wound the exact etiology of this is unclear. 12/07/2018; the area has come down in diameter. Surface requires debridement. We have been using silver  collagen.  Is the wound seems to have come down in size I have elected not to biopsy this. Surrounding inflammation is also less 2/11; the area continues to come down in size and generally looks healthy. We have been using silver  collagen under compression surrounding inflammation is less there is no tenderness 2/18; the area continues to contract in size and looks healthy. We have been using silver  collagen under compression 2/25; no major change in dimension this week. However the wound bed continues to look healthy and there appears to be less inflammation around the wound 3/3; the wound measures slightly smaller. Healthy looking surface. Less inflammation 3/10; wound is closed. Healthy looking surface. Still some discoloration around the wound area but absolutely no tenderness. The cause of this wound was never really determined. However it progressed nicely towards healing READMISSION 10/06/2019 This is a patient I dealt with with an unusual wound on the left posterior lateral calf in the early part of this year. I was never really completely sure I understood what this was in terms of causation. However wde use silver  collagen under compression and the area responded nicely and we are able to discharge him after roughly 6 weeks in the clinic. He did not clearly have arterial or venous issues. I had some thoughts about biopsying this area however acid continued to get better I did  not go through with this. He states that everything is been fine up until the last few weeks. His wife noted a scab with perhaps some purulent drainage. They called primary care and was prescribed doxy which she is finished and everything seems to have settled down there was no open area however the patient wanted us  to look at this. READMISSION 08/20/2023 ***ABIs L: 1.04; R: 1.11*** This is a now 48 year old type II diabetic (last hemoglobin A1c 6.7%) who returns with a new wound on his right lower extremity, anterior tibial surface. He says it began exactly the way he is previous wound that led to wound care center admission started. He was mowing the lawn and developed a bump on his leg. It subsequently broke down resulting in a large ulcer. Patient was seen in the emergency department on 04 August 2023. He was given a prescription for Keflex  and Bactrim . He was referred to the wound care center for further evaluation and management. 08/28/2023: The wound measured a little bit larger today and has a nonviable surface. There is still an odor coming from the wound. Edema control is good. The culture that I took last week grew out a polymicrobial population but all organisms were at fairly low levels. SHLOIMY, MICHALSKI [name] ([id number]) [id number] Page 6 of 8 09/02/2023: The wound size was stable today, but it is markedly cleaner with just a little bit of slough and eschar around the edges. The odor has abated. He has had quite a bit of drainage. Edema control is excellent. 09/15/2023: The wound was a little bit smaller today and the surface is incredibly clean without any slough or eschar buildup. No odor or significant drainage. Edema control is excellent. 09/22/2023: The wound is smaller with healthy and robust granulation tissue filling in. Edema control remains excellent. 09/29/2023: The wound continues to contract. There is very minimal slough accumulation and the  granulation tissue is healthy. Good edema control. 10/27/2023: The wound is smaller again today. The surface is extremely clean with healthy granulation tissue present. Good edema control. 11/03/2023: Continued contraction of the wound. The surface is a little bit hyper granulated and  friable. No concern for infection. Edema control is excellent. 11/10/2023: The wound is smaller again today. There is a little bit of slough on the surface and the tissue remains slightly hyper granulated, but it is not particularly friable. Edema control remains excellent. Objective Constitutional Hypertensive, asymptomatic. Slightly tachycardic. no acute distress. Vitals Time Taken: 9:30 AM, Height: 69 in, Weight: 330 lbs, BMI: 48.7, Temperature: 98.2 F, Pulse: 104 bpm, Respiratory Rate: 18 breaths/min, Blood Pressure: 149/90 mmHg. General Notes: pt does not check his blood sugars regularly Respiratory Normal work of breathing on room air.. General Notes: 11/10/2023: The wound is smaller again today. There is a little bit of slough on the surface and the tissue remains slightly hyper granulated, but it is not particularly friable. Edema control remains excellent. Integumentary (Hair, Skin) Wound #2 status is Open. Original cause of wound was Bump. The date acquired was: 07/18/2023. The wound has been in treatment 11 weeks. The wound is located on the Right,Anterior Lower Leg. The wound measures 1.8cm length x 0.8cm width x 0.1cm depth; 1.131cm^2 area and 0.113cm^3 volume. There is Fat Layer (Subcutaneous Tissue) exposed. There is no tunneling or undermining noted. There is a medium amount of serosanguineous drainage noted. The wound margin is flat and intact. There is large (67-100%) red, friable granulation within the wound bed. There is a small (1-33%) amount of necrotic tissue within the wound bed including Adherent Slough. The periwound skin appearance had no abnormalities noted for texture. The periwound skin  appearance had no abnormalities noted for moisture. The periwound skin appearance exhibited: Hemosiderin Staining. Periwound temperature was noted as No Abnormality. Assessment Active Problems ICD-10 Non-pressure chronic ulcer of other part of right lower leg with unspecified severity Type 2 diabetes mellitus with other skin ulcer Morbid (severe) obesity due to excess calories Procedures Wound #2 Pre-procedure diagnosis of Wound #2 is a Diabetic Wound/Ulcer of the Lower Extremity located on the Right,Anterior Lower Leg .Severity of Tissue Pre Debridement is: Fat layer exposed. There was a Excisional Skin/Subcutaneous Tissue Debridement with a total area of 1.13 sq cm performed by Marolyn Nest, MD. With the following instrument(s): Curette to remove Viable and Non-Viable tissue/material. Material removed includes Subcutaneous Tissue and Slough and after achieving pain control using Lidocaine  4% T opical Solution. No specimens were taken. A time out was conducted at 09:45, prior to the start of the procedure. A Minimum amount of bleeding was controlled with Pressure. The procedure was tolerated well with a pain level of 0 throughout and a pain level of 0 following the procedure. Post Debridement Measurements: 1.8cm length x 0.8cm width x 0.1cm depth; 0.113cm^3 volume. Character of Wound/Ulcer Post Debridement is improved. Severity of Tissue Post Debridement is: Fat layer exposed. Post procedure Diagnosis Wound #2: Same as Pre-Procedure Pre-procedure diagnosis of Wound #2 is a Diabetic Wound/Ulcer of the Lower Extremity located on the Right,Anterior Lower Leg . There was a Double Layer Compression Therapy Procedure by [name] Handing, RN. Post procedure Diagnosis Wound #2: Same as Pre-Procedure Notes: urgo lite. TREVAN, MESSMAN [name] ([id number]) [id number] Page 7 of 8 Plan Follow-up Appointments: Return Appointment in 1 week. - Dr. Marolyn 11/17/23 at 9:15  am Anesthetic: (In clinic) Topical Lidocaine  4% applied to wound bed Bathing/ Shower/ Hygiene: May shower with protection but do not get wound dressing(s) wet. Protect dressing(s) with water repellant cover (for example, large plastic bag) or a cast cover and may then take shower. Edema Control - Orders / Instructions: Elevate legs to the level of the heart  or above for 30 minutes daily and/or when sitting for 3-4 times a day throughout the day. Avoid standing for long periods of time. Exercise regularly If compression wraps slide down please call wound center and speak with a nurse. WOUND #2: - Lower Leg Wound Laterality: Right, Anterior Cleanser: Soap and Water 1 x Per Week/30 Days Discharge Instructions: May shower and wash wound with dial antibacterial soap and water prior to dressing change. Cleanser: Vashe 5.8 (oz) 1 x Per Week/30 Days Discharge Instructions: Cleanse the wound with Vashe prior to applying a clean dressing using gauze sponges, not tissue or cotton balls. Peri-Wound Care: Sween Lotion (Moisturizing lotion) 1 x Per Week/30 Days Discharge Instructions: Apply moisturizing lotion as directed Prim Dressing: Hydrofera Blue Ready Transfer Foam, 4x5 (in/in) 1 x Per Week/30 Days ary Discharge Instructions: Apply to wound bed as instructed Secondary Dressing: Woven Gauze Sponge, Non-Sterile 4x4 in 1 x Per Week/30 Days Discharge Instructions: Apply over primary dressing as directed. Com pression Wrap: Urgo K2 Lite, (equivalent to a 3 layer) two layer compression system, regular 1 x Per Week/30 Days Discharge Instructions: Apply Urgo K2 Lite as directed (alternative to 3 layer compression). 11/10/2023: The wound is smaller again today. There is a little bit of slough on the surface and the tissue remains slightly hyper granulated, but it is not particularly friable. Edema control remains excellent. I used a curette to debride slough and subcutaneous tissue from the wound. I then  chemically cauterized the hypertrophic granulation tissue with silver  nitrate. We will continue Hydrofera Blue and Urgo light compression. Follow-up in 1 week. Electronic Signature(s) Signed: 11/10/2023 10:08:58 AM By: Marolyn Nest MD FACS Entered By: Marolyn Nest on 11/10/2023 10:08:57 -------------------------------------------------------------------------------- SuperBill Details Patient Name: Date of Service: [name], [name] 11/10/2023 Medical Record Number: [id number] Patient Account Number: [id number] Date of Birth/Sex: Treating RN: 1976-06-29 (47 y.o. 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 with unspecified sev E11.622 Type 2 diabetes mellitus with other skin ulcer E66.01 Morbid (severe) obesity  due to excess calories [id number] Modifier: erity [id number] Quantity: 1 Page 8 of 8 Physician Procedures : CPT4 Code Description Modifier [id number] 11042 - WC PHYS SUBQ TISS 20 SQ CM ICD-10 Diagnosis Description L97.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 with unspecified severity E11.622 Type 2 diabetes mellitus with other skin ulcer  E66.01 Morbid (severe) obesity due to excess calories Quantity: 1 Electronic Signature(s) Signed: 11/10/2023 10:09:09 AM By: Marolyn Nest MD FACS Entered By: Marolyn Nest on 11/10/2023 10:09:08

## 2023-11-11 NOTE — Progress Notes (Signed)
 DEMARCO, BACCI [name] ([id number]) [id number] Page 1 of 8 Visit Report for 11/10/2023 Arrival Information Details Patient Name: Date of Service: [name], [place] [address] [name] 11/10/2023 9:15 A M Medical Record Number: [id number] Patient Account Number: [id number] Date of Birth/Sex: Treating RN: 02-18-1976 (47 y.o. [name] Primary Care Summer Mccolgan: [name] Other Clinician: Referring Alle Difabio: Treating [name]/Extender: Marolyn Delon [name] Devra in Treatment: 11 Visit Information History Since Last Visit Added or deleted any medications: No Patient Arrived: Ambulatory Any new allergies or adverse reactions: No Arrival Time: 09:31 Had a fall or experienced change in No Accompanied By: self activities of daily living that may affect Transfer Assistance: None risk of falls: Patient Identification Verified: Yes Signs or symptoms of abuse/neglect since last visito No Secondary Verification Process Completed: Yes Hospitalized since last visit: No Patient Requires Transmission-Based Precautions: No Implantable device outside of the clinic excluding No Patient Has Alerts: No cellular tissue based products placed in the center since last visit: Has Dressing in Place as Prescribed: Yes Has Compression in Place as Prescribed: Yes Pain Present Now: No Electronic Signature(s) Signed: 11/10/2023 5:04:29 PM By: [name] Handing RN, BSN Entered By: Boehlein, [name] on 11/10/2023 09:31:53 -------------------------------------------------------------------------------- Compression Therapy Details Patient Name: Date of Service: [name], [name] 11/10/2023 9:15 A M Medical Record Number: [id number] Patient Account Number: [id number] Date of Birth/Sex: Treating RN: [birth year] (47 y.o. [name] Primary Care Biagio Snelson: [name], [name] Other Clinician: Referring Maiana Hennigan: Treating [name]/Extender: Marolyn Delon [name] Devra in Treatment: 11 Compression  Therapy Performed for Wound Assessment: Wound #2 Right,Anterior Lower Leg Performed By: Clinician [name] Handing, RN Compression Type: Double Layer Post Procedure Diagnosis Same as Pre-procedure Notes urgo lite Electronic Signature(s) Signed: 11/10/2023 5:04:29 PM By: [name] Handing RN, BSN Entered By: [name] on 11/10/2023 09:38:23 ROSALYNN [name] [name] ([id number]) [id number] Page 2 of 8 -------------------------------------------------------------------------------- Encounter Discharge Information Details Patient Name: Date of Service: [name] [name] 11/10/2023 9:15 A M Medical Record Number: [id number] Patient Account Number: [id number] Date of Birth/Sex: Treating RN: 1976/09/08 (47 y.o. [name] Primary Care Benji Poynter: [name] Other Clinician: Referring Fielding Mault: Treating Harshitha Fretz/Extender: Marolyn Delon [name] Devra in Treatment: 11 Encounter Discharge Information Items Post Procedure Vitals Discharge Condition: Stable Temperature (F): 98.2 Ambulatory Status: Ambulatory Pulse (bpm): 104 Discharge Destination: Home Respiratory Rate (breaths/min): 18 Transportation: Private Auto Blood Pressure (mmHg): 149/90 Accompanied By: self Schedule Follow-up Appointment: Yes Clinical Summary of Care: Patient Declined Electronic Signature(s) Signed: 11/10/2023 5:04:29 PM By: [name] Handing RN, BSN Entered By: [name] on 11/10/2023 10:00:38 -------------------------------------------------------------------------------- Lower Extremity Assessment Details Patient Name: Date of Service: [name], [name] 11/10/2023 9:15 A M Medical Record Number: [id number] Patient Account Number: [id number] Date of Birth/Sex: Treating RN: 1976/08/23 (47 y.o. [name] Primary Care Jeffey Janssen: [name] Other Clinician: Referring Amine Adelson: Treating Shaianne Nucci/Extender: Marolyn Delon [name], [name] in Treatment: 11 Edema  Assessment Assessed: [Left: No] [Right: No] Edema: [Left: Ye] [Right: s] Calf Left: Right: Point of Measurement: From Medial Instep 36 cm Ankle Left: Right: Point of Measurement: From Medial Instep 24.5 cm Vascular Assessment Pulses: Dorsalis Pedis Palpable: [Right:Yes] Extremity colors, hair growth, and conditions: Extremity Color: [Right:Hyperpigmented] Hair Growth on Extremity: [Right:Yes] Temperature of Extremity: [Right:Warm] Capillary Refill: [Right:< 3 seconds] Dependent Rubor: [Right:No] Blanched when Elevated: [Right:No No] Toe Nail Assessment [name] ([id number]) [id number] Page 3 of 8 Left: Right: Thick: No Discolored: No Deformed: No Improper Length and Hygiene: No Electronic Signature(s) Signed: 11/10/2023 5:04:29 PM  By: Boehlein, Linda RN, BSN Entered By: Boehlein, [name] on 11/10/2023 09:34:19 -------------------------------------------------------------------------------- Multi Wound Chart Details Patient Name: Date of Service: [name], [name] 11/10/2023 9:15 A M Medical Record Number: [id number] Patient Account Number: [id number] Date of Birth/Sex: Treating RN: [birth year] (47 y.o. M) Primary Care Aerion Bagdasarian: [name] Other Clinician: Referring Bill Mcvey: Treating Tristram Milian/Extender: Marolyn Delon [name], [name] in Treatment: 11 Vital Signs Height(in): 69 Pulse(bpm): 104 Weight(lbs): 330 Blood Pressure(mmHg): 149/90 Body Mass Index(BMI): 48.7 Temperature(F): 98.2 Respiratory Rate(breaths/min): 18 [2:Photos:] [N/A:N/A] Right, Anterior Lower Leg N/A N/A Wound Location: Bump N/A N/A Wounding Event: Diabetic Wound/Ulcer of the Lower N/A N/A Primary Etiology: Extremity Sleep Apnea, Hypertension, Type II N/A N/A Comorbid History: Diabetes 07/18/2023 N/A N/A Date Acquired: 11 N/A N/A [name] of Treatment: Open N/A N/A Wound Status: No N/A N/A Wound Recurrence: 1.8x0.8x0.1 N/A N/A Measurements L x W x D  (cm) 1.131 N/A N/A A (cm) : rea 0.113 N/A N/A Volume (cm) : 92.40% N/A N/A % Reduction in A rea: 92.40% N/A N/A % Reduction in Volume: Grade 2 N/A N/A Classification: Medium N/A N/A Exudate A mount: Serosanguineous N/A N/A Exudate Type: red, brown N/A N/A Exudate Color: Flat and Intact N/A N/A Wound Margin: Large (67-100%) N/A N/A Granulation A mount: Red, Friable N/A N/A Granulation Quality: Small (1-33%) N/A N/A Necrotic A mount: Fat Layer (Subcutaneous Tissue): Yes N/A N/A Exposed Structures: Fascia: No Tendon: No Muscle: No Joint: No Bone: No Small (1-33%) N/A N/A Epithelialization: Debridement - Excisional N/A N/A Debridement: Pre-procedure Verification/Time Out 09:45 N/A N/A TakenJAHSEH, [name] ([id number]) [id number] Page 4 of 8 Lidocaine  4% Topical Solution N/A N/A Pain Control: Subcutaneous, Slough N/A N/A Tissue Debrided: Skin/Subcutaneous Tissue N/A N/A Level: 1.13 N/A N/A Debridement A (sq cm): rea Curette N/A N/A Instrument: Minimum N/A N/A Bleeding: Pressure N/A N/A Hemostasis A chieved: 0 N/A N/A Procedural Pain: 0 N/A N/A Post Procedural Pain: Procedure was tolerated well N/A N/A Debridement Treatment Response: 1.8x0.8x0.1 N/A N/A Post Debridement Measurements L x W x D (cm) 0.113 N/A N/A Post Debridement Volume: (cm) No Abnormalities Noted N/A N/A Periwound Skin Texture: No Abnormalities Noted N/A N/A Periwound Skin Moisture: Hemosiderin Staining: Yes N/A N/A Periwound Skin Color: No Abnormality N/A N/A Temperature: Compression Therapy N/A N/A Procedures Performed: Debridement Treatment Notes Wound #2 (Lower Leg) Wound Laterality: Right, Anterior Cleanser Soap and Water Discharge Instruction: May shower and wash wound with dial antibacterial soap and water prior to dressing change. Vashe 5.8 (oz) Discharge Instruction: Cleanse the wound with Vashe prior to applying a clean dressing using  gauze sponges, not tissue or cotton balls. Peri-Wound Care Sween Lotion (Moisturizing lotion) Discharge Instruction: Apply moisturizing lotion as directed Topical Primary Dressing Hydrofera Blue Ready Transfer Foam, 4x5 (in/in) Discharge Instruction: Apply to wound bed as instructed Secondary Dressing Woven Gauze Sponge, Non-Sterile 4x4 in Discharge Instruction: Apply over primary dressing as directed. Secured With Compression Wrap Urgo K2 Lite, (equivalent to a 3 layer) two layer compression system, regular Discharge Instruction: Apply Urgo K2 Lite as directed (alternative to 3 layer compression). Compression Stockings Add-Ons Electronic Signature(s) Signed: 11/10/2023 10:06:40 AM By: Marolyn Delon MD FACS Entered By: Marolyn Delon on 11/10/2023 10:06:40 -------------------------------------------------------------------------------- Multi-Disciplinary Care Plan Details Patient Name: Date of Service: [name], [name] 11/10/2023 9:15 A M Medical Record Number: [id number] Patient Account Number: [id number] Date of Birth/Sex: Treating RN: 06/27/1976 (47 y.o. [name] Primary Care Roselyne Stalnaker: [name], [name] Other Clinician: Referring Rosalia Mcavoy: Treating Tyler Robidoux/Extender: Marolyn Delon [name], [name]  [name] in Treatment: [address] [place] [name] ([id number]) [id number] Page 5 of 8 Multidisciplinary Care Plan reviewed with physician Active Inactive Wound/Skin Impairment Nursing Diagnoses: Knowledge deficit related to ulceration/compromised skin integrity Goals: Patient/caregiver will verbalize understanding of skin care regimen Date Initiated: 08/20/2023 Target Resolution Date: 12/04/2023 Goal Status: Active Interventions: Assess patient/caregiver ability to obtain necessary supplies Assess patient/caregiver ability to perform ulcer/skin care regimen upon admission and as needed Assess ulceration(s) every visit Provide education on ulcer and skin  care Screen for HBO Treatment Activities: Skin care regimen initiated : 08/20/2023 Topical wound management initiated : 08/20/2023 Notes: Electronic Signature(s) Signed: 11/10/2023 5:04:29 PM By: [name] Handing RN, BSN Entered By: [name] on 11/10/2023 09:29:15 -------------------------------------------------------------------------------- Pain Assessment Details Patient Name: Date of Service: [name], [name] 11/10/2023 9:15 A M Medical Record Number: [id number] Patient Account Number: [id number] Date of Birth/Sex: Treating RN: 1976/02/20 (47 y.o. [name] Primary Care Xhaiden Coombs: [name], [name] Other Clinician: Referring Cassiopeia Florentino: Treating Taneal Sonntag/Extender: Marolyn Delon Plough, [name] in Treatment: 11 Active Problems Location of Pain Severity and Description of Pain Patient Has Paino No Site Locations Rate the pain. Current Pain Level: 0 Pain Management and Medication Current Pain ManagementRYLEIGH, BUENGER [name] ([id number]) [id number] Page 6 of 8 Electronic Signature(s) Signed: 11/10/2023 5:04:29 PM By: [name] Handing RN, BSN Entered By: Boehlein, [name] on 11/10/2023 09:32:57 -------------------------------------------------------------------------------- Patient/Caregiver Education Details Patient Name: Date of Service: [name] [name] 1/7/2025andnbsp9:15 A M Medical Record Number: [id number] Patient Account Number: [id number] Date of Birth/Gender: Treating RN: 06-17-1976 (47 y.o. [name] Primary Care Physician: [name], [name] Other Clinician: Referring Physician: Treating Physician/Extender: Marolyn Delon Plough Roxan Devra in Treatment: 11 Education Assessment Education Provided To: Patient Education Topics Provided Venous: Methods: Explain/Verbal Responses: Reinforcements needed, State content correctly Wound/Skin Impairment: Methods: Explain/Verbal Responses: Reinforcements needed, State content  correctly Electronic Signature(s) Signed: 11/10/2023 5:04:29 PM By: [name] Handing RN, BSN Entered By: [name] on 11/10/2023 09:29:37 -------------------------------------------------------------------------------- Wound Assessment Details Patient Name: Date of Service: [name], [name] 11/10/2023 9:15 A M Medical Record Number: [id number] Patient Account Number: [id number] Date of Birth/Sex: Treating RN: [birth year] (47 y.o. [name] Primary Care Ryatt Corsino: [name], [name] Other Clinician: Referring Koi Yarbro: Treating Nichollas Perusse/Extender: Marolyn Delon [name], [name] in Treatment: 11 Wound Status Wound Number: 2 Primary Etiology: Diabetic Wound/Ulcer of the Lower Extremity Wound Location: Right, Anterior Lower Leg Wound Status: Open Wounding Event: Bump Comorbid History: Sleep Apnea, Hypertension, Type II Diabetes Date Acquired: 07/18/2023 [name] Of Treatment: 11 Clustered Wound: No Photos [name], [name] [name] ([id number]) [id number] Page 7 of 8 Wound Measurements Length: (cm) 1.8 Width: (cm) 0.8 Depth: (cm) 0.1 Area: (cm) 1.131 Volume: (cm) 0.113 % Reduction in Area: 92.4% % Reduction in Volume: 92.4% Epithelialization: Small (1-33%) Tunneling: No Undermining: No Wound Description Classification: Grade 2 Wound Margin: Flat and Intact Exudate Amount: Medium Exudate Type: Serosanguineous Exudate Color: red, brown Foul Odor After Cleansing: No Slough/Fibrino No Wound Bed Granulation Amount: Large (67-100%) Exposed Structure Granulation Quality: Red, Friable Fascia Exposed: No Necrotic Amount: Small (1-33%) Fat Layer (Subcutaneous Tissue) Exposed: Yes Necrotic Quality: Adherent Slough Tendon Exposed: No Muscle Exposed: No Joint Exposed: No Bone Exposed: No Periwound Skin Texture Texture Color No Abnormalities Noted: Yes No Abnormalities Noted: No Hemosiderin Staining: Yes Moisture No Abnormalities Noted: Yes Temperature  / Pain Temperature: No Abnormality Treatment Notes Wound #2 (Lower Leg) Wound Laterality: Right, Anterior Cleanser Soap and Water Discharge Instruction: May shower and wash wound with dial antibacterial soap and  water prior to dressing change. Vashe 5.8 (oz) Discharge Instruction: Cleanse the wound with Vashe prior to applying a clean dressing using gauze sponges, not tissue or cotton balls. Peri-Wound Care Sween Lotion (Moisturizing lotion) Discharge Instruction: Apply moisturizing lotion as directed Topical Primary Dressing Hydrofera Blue Ready Transfer Foam, 4x5 (in/in) Discharge Instruction: Apply to wound bed as instructed Secondary Dressing Woven Gauze Sponge, Non-Sterile 4x4 in Discharge Instruction: Apply over primary dressing as directed. Secured With Compression Wrap Urgo K2 Lite, (equivalent to a 3 layer) two layer compression system, regular Discharge Instruction: Apply Urgo K2 Lite as directed (alternative to 3 layer compression). [name], [name] [name] ([id number]) [id number] Page 8 of 8 Compression Stockings Add-Ons Electronic Signature(s) Signed: 11/10/2023 5:04:29 PM By: [name] Handing RN, BSN Entered By: Boehlein, [name] on 11/10/2023 09:36:08 -------------------------------------------------------------------------------- Vitals Details Patient Name: Date of Service: [name], [name] 11/10/2023 9:15 A M Medical Record Number: [id number] Patient Account Number: [id number] Date of Birth/Sex: Treating RN: 01-08-1976 (47 y.o. [name] Primary Care Nayson Traweek: [name], [name] Other Clinician: Referring Jameshia Hayashida: Treating Darnella Zeiter/Extender: Marolyn Delon Plough, [name] in Treatment: 11 Vital Signs Time Taken: 09:30 Temperature (F): 98.2 Height (in): 69 Pulse (bpm): 104 Weight (lbs): 330 Respiratory Rate (breaths/min): 18 Body Mass Index (BMI): 48.7 Blood Pressure (mmHg): 149/90 Reference Range: 80 - 120 mg / dl Notes pt does  not check his blood sugars regularly Electronic Signature(s) Signed: 11/10/2023 5:04:29 PM By: [name] Handing RN, BSN Entered By: Boehlein, [name] on 11/10/2023 09:32:48

## 2023-11-13 ENCOUNTER — Ambulatory Visit: Payer: BC Managed Care – PPO | Admitting: Psychology

## 2023-11-13 DIAGNOSIS — F4323 Adjustment disorder with mixed anxiety and depressed mood: Secondary | ICD-10-CM

## 2023-11-13 NOTE — Progress Notes (Signed)
 [facility] Counselor/Therapist Progress Note  Patient ID: LAUREL SMELTZ, MRN: [id number],    Date: 11/13/2023  Time Spent: 50 mins; start time: 0800; end time: 0850  Treatment Type: Individual Therapy  Reported Symptoms: Pt presents for session via Caregility video.  Pt grants consent for session, stating he is in his home with no one else present; pt also shares that he understands the limitations of virtual session.  I shared with pt that I am in my office with no one else here either.  Mental Status Exam: Appearance:  Casual     Behavior: Appropriate  Motor: Normal  Speech/Language:  Clear and Coherent  Affect: Appropriate  Mood: normal  Thought process: normal  Thought content:   WNL  Sensory/Perceptual disturbances:   WNL  Orientation: oriented to person, place, and time/date  Attention: Good  Concentration: Good  Memory: WNL  Fund of knowledge:  Good  Insight:   Good  Judgment:  Good  Impulse Control: Good   Risk Assessment: Danger to Self:  No Self-injurious Behavior: No Danger to Others: No Duty to Warn:no Physical Aggression / Violence:No  Access to Firearms a concern: No  Gang Involvement:No   Subjective: Pt shares that, I survived yesterday; my mom had her first cancer surgery yesterday and she came through fine and is already back home.  She is very private and did not want the grandchildren to know about her condition so that has been awkward for me and my sister who is in town.  Pt shares that [facility] is doing a remote learning day today and so his wife just has to answer emails and/or answer work phone calls during the day.  His oldest son is still working his third shift job at [employer] and is starting to enjoy it a bit more now that he has been doing it for a while.  He has had some issues with attendance due to the third shift hours but he is getting better at it now.  Pt shares he is supposed to be checking his BP at home but he  has not been consistent with that in the past few weeks with the holidays and his mom's health issues.  [facility] has [facility] and they check his BP there and it has been OK on those dates.  His next PCP follow up is in Feb.  His wound on his leg is continuing to heal but he still has progress that is needed.  Pt shares that he took a TV mental health day with my wife last Saturday; we binge-watched TV all day and I did not worry about anything that needed to be done.  Pt continues to have Tues, Wed, and Sat off from work each week.  Encouraged pt to be intentional about engaging in his self care activities and we will meet in 2 weeks for a follow up session.  Interventions: Cognitive Behavioral Therapy  Diagnosis:Adjustment disorder with mixed anxiety and depressed mood  Plan: Treatment Plan Strengths/Abilities:  Intelligent, Intuitive, Willing to participate in therapy Treatment Preferences:  Outpatient Individual Therapy Statement of Needs:  Patient is to use CBT, mindfulness and coping skills to help manage and/or decrease symptoms associated with their diagnosis. Symptoms:  Depressed/Irritable mood, worry, social withdrawal Problems Addressed:  Depressive thoughts, Sadness, Sleep issues, etc. Long Term Goals:  Pt to reduce overall level, frequency, and intensity of the feelings of depression/anxiety as evidenced by decreased irritability,  negative self talk, and helpless feelings from 6 to 7 days/week to 0 to 1 days/week, per client report, for at least 3 consecutive months.  Progress: 30% Short Term Goals:  Pt to verbally express understanding of the relationship between feelings of depression/anxiety and their impact on thinking patterns and behaviors.  Pt to verbalize an understanding of the role that distorted thinking plays in creating fears, excessive worry, and ruminations.  Progress: 30% Target Date:  04/24/2024 Frequency:  Bi-weekly Modality:   Cognitive Behavioral Therapy Interventions by Therapist:  Therapist will use CBT, Mindfulness exercises, Coping skills and Referrals, as needed by client. Client has verbally approved this treatment plan.  Francis KATHEE Macintosh, [facility]

## 2023-11-16 ENCOUNTER — Other Ambulatory Visit: Payer: Self-pay | Admitting: Medical Oncology

## 2023-11-16 DIAGNOSIS — D696 Thrombocytopenia, unspecified: Secondary | ICD-10-CM

## 2023-11-17 ENCOUNTER — Inpatient Hospital Stay: Payer: BC Managed Care – PPO | Attending: Internal Medicine | Admitting: Internal Medicine

## 2023-11-17 ENCOUNTER — Inpatient Hospital Stay: Payer: BC Managed Care – PPO

## 2023-11-17 ENCOUNTER — Encounter (HOSPITAL_BASED_OUTPATIENT_CLINIC_OR_DEPARTMENT_OTHER): Payer: BC Managed Care – PPO | Admitting: General Surgery

## 2023-11-17 VITALS — BP 150/103 | HR 96 | Temp 98.0°F | Resp 17 | Wt 321.0 lb

## 2023-11-17 DIAGNOSIS — D72819 Decreased white blood cell count, unspecified: Secondary | ICD-10-CM | POA: Diagnosis not present

## 2023-11-17 DIAGNOSIS — D696 Thrombocytopenia, unspecified: Secondary | ICD-10-CM

## 2023-11-17 DIAGNOSIS — L97919 Non-pressure chronic ulcer of unspecified part of right lower leg with unspecified severity: Secondary | ICD-10-CM | POA: Insufficient documentation

## 2023-11-17 DIAGNOSIS — E11622 Type 2 diabetes mellitus with other skin ulcer: Secondary | ICD-10-CM | POA: Insufficient documentation

## 2023-11-17 DIAGNOSIS — Z800 Family history of malignant neoplasm of digestive organs: Secondary | ICD-10-CM

## 2023-11-17 DIAGNOSIS — Z6841 Body Mass Index (BMI) 40.0 and over, adult: Secondary | ICD-10-CM | POA: Diagnosis not present

## 2023-11-17 DIAGNOSIS — L97812 Non-pressure chronic ulcer of other part of right lower leg with fat layer exposed: Secondary | ICD-10-CM | POA: Diagnosis not present

## 2023-11-17 DIAGNOSIS — I10 Essential (primary) hypertension: Secondary | ICD-10-CM | POA: Insufficient documentation

## 2023-11-17 LAB — CBC WITH DIFFERENTIAL (CANCER CENTER ONLY)
Abs Immature Granulocytes: 0.04 10*3/uL (ref 0.00–0.07)
Basophils Absolute: 0.1 10*3/uL (ref 0.0–0.1)
Basophils Relative: 2 %
Eosinophils Absolute: 0 10*3/uL (ref 0.0–0.5)
Eosinophils Relative: 1 %
HCT: 38 % — ABNORMAL LOW (ref 39.0–52.0)
Hemoglobin: 13.1 g/dL (ref 13.0–17.0)
Immature Granulocytes: 1 %
Lymphocytes Relative: 28 %
Lymphs Abs: 1.1 10*3/uL (ref 0.7–4.0)
MCH: 28.9 pg (ref 26.0–34.0)
MCHC: 34.5 g/dL (ref 30.0–36.0)
MCV: 83.9 fL (ref 80.0–100.0)
Monocytes Absolute: 0.3 10*3/uL (ref 0.1–1.0)
Monocytes Relative: 8 %
Neutro Abs: 2.5 10*3/uL (ref 1.7–7.7)
Neutrophils Relative %: 60 %
Platelet Count: 189 10*3/uL (ref 150–400)
RBC: 4.53 MIL/uL (ref 4.22–5.81)
RDW: 13.3 % (ref 11.5–15.5)
WBC Count: 4.1 10*3/uL (ref 4.0–10.5)
nRBC: 0 % (ref 0.0–0.2)

## 2023-11-17 LAB — CMP (CANCER CENTER ONLY)
ALT: 14 U/L (ref 0–44)
AST: 11 U/L — ABNORMAL LOW (ref 15–41)
Albumin: 4.3 g/dL (ref 3.5–5.0)
Alkaline Phosphatase: 55 U/L (ref 38–126)
Anion gap: 5 (ref 5–15)
BUN: 24 mg/dL — ABNORMAL HIGH (ref 6–20)
CO2: 30 mmol/L (ref 22–32)
Calcium: 9.5 mg/dL (ref 8.9–10.3)
Chloride: 103 mmol/L (ref 98–111)
Creatinine: 1.52 mg/dL — ABNORMAL HIGH (ref 0.61–1.24)
GFR, Estimated: 57 mL/min — ABNORMAL LOW (ref >60)
Glucose, Bld: 105 mg/dL — ABNORMAL HIGH (ref 70–99)
Potassium: 4 mmol/L (ref 3.5–5.1)
Sodium: 138 mmol/L (ref 135–145)
Total Bilirubin: 1 mg/dL (ref 0.0–1.2)
Total Protein: 7.1 g/dL (ref 6.5–8.1)

## 2023-11-17 LAB — HEPATITIS PANEL, ACUTE
HCV Ab: NONREACTIVE
Hep A IgM: NONREACTIVE
Hep B C IgM: NONREACTIVE
Hepatitis B Surface Ag: NONREACTIVE

## 2023-11-17 LAB — HIV ANTIBODY (ROUTINE TESTING W REFLEX): HIV Screen 4th Generation wRfx: NONREACTIVE

## 2023-11-17 NOTE — Progress Notes (Signed)
 [facility] [phone]   [phone]  CONSULT NOTE  REFERRING PHYSICIAN: Roxan Plough, NP  REASON FOR CONSULTATION:  48 years old African-American male with leukocytopenia and thrombocytopenia  HPI [name] is a 48 y.o. male came to the clinic today accompanied by his wife for evaluation of leukocytopenia and thrombocytopenia. Discussed the use of AI scribe software for clinical note transcription with the patient, who gave verbal consent to proceed.  History of Present Illness   [name], a 48 year old with a history of hypertension, low potassium, diabetes, ADHD, migraines, sleep apnea, anxiety, and depression, was referred for evaluation of low platelets. The patient was unaware of the reason for the referral and had no specific complaints at the time of the visit. The patient's primary care physician had noted a low white blood cell count and low platelets during a routine check-up. The patient's platelet count had been fluctuating for several years, sometimes low, sometimes normal.  The patient also reported a persistent wound on the leg, the origin of which was unknown. The wound started as a small boil and then opened up. The patient was being followed by a wound care clinic and had been prescribed antibiotics. The patient was also taking ibuprofen several times a week for a bone spur in the elbow causing inflammation and pain.  The patient reported occasional loss of appetite, feeling hungry but losing interest in food upon seeing it. The patient attributed this to the recent initiation of Ozempic  for diabetes management. The patient denied any current fever, chills, bleeding, or bruising. The patient's family history was significant for hypertension in the mother, who was recently diagnosed with breast cancer at the age of 30, and kidney disease in the father. The patient denied any history of smoking and reported regular alcohol  consumption, usually one to two drinks most days.      HPI  Past Medical History:  Diagnosis Date   ADHD (attention deficit hyperactivity disorder)    Anxiety    Depression    Hypertension    Migraine    Nasal congestion 10/03/2014   Non-insulin  dependent type 2 diabetes mellitus (HCC)    Obesity    Sleep apnea    Wears contact lenses     Past Surgical History:  Procedure Laterality Date   CHOLECYSTECTOMY N/A 10/09/2014   Procedure: LAPAROSCOPIC CHOLECYSTECTOMY;  Surgeon: Vicenta Poli, MD;  Location: [facility];  Service: General;  Laterality: N/A;    Family History  Problem Relation Age of Onset   Hypertension Mother    Kidney disease Father    Diabetes Maternal Aunt    Diabetes Paternal Aunt    Cancer Maternal Grandfather        throat   Stroke Paternal Grandmother    Alcohol abuse Paternal Grandmother    Heart disease Neg Hx     Social History Social History   Tobacco Use   Smoking status: Never   Smokeless tobacco: Never  Vaping Use   Vaping status: Never Used  Substance Use Topics   Alcohol use: Yes    Comment: occasional   Drug use: No    No Known Allergies  Current Outpatient Medications  Medication Sig Dispense Refill   atorvastatin  (LIPITOR) 10 MG tablet Take 1 tablet (10 mg total) by mouth daily. 90 tablet 3   Blood Glucose Monitoring Suppl DEVI 1 each by Does not apply route in the morning, at noon, and at bedtime. May substitute  to any manufacturer covered by patient's insurance. 1 each 0   Blood Pressure KIT 1 each by Does not apply route daily. 1 kit 0   metoprolol  tartrate (LOPRESSOR ) 25 MG tablet Take 1 tablet (25 mg total) by mouth 2 (two) times daily. 180 tablet 3   potassium chloride  SA (KLOR-CON  M) 20 MEQ tablet Take 1 tablet (20 mEq total) by mouth daily for 5 days. 5 tablet 0   Semaglutide ,0.25 or 0.5MG /DOS, (OZEMPIC , 0.25 OR 0.5 MG/DOSE,) 2 MG/3ML SOPN INJECT 0.5 MG INTO THE SKIN ONE TIME PER WEEK 9 mL 1    spironolactone (ALDACTONE) 50 MG tablet Take 50 mg by mouth daily.     tadalafil  (CIALIS ) 5 MG tablet TAKE 1 TABLET BY MOUTH DAILY AS NEEDED FOR ERECTILE DYSFUNCTION. 30 tablet 0   valsartan  (DIOVAN ) 160 MG tablet TAKE 1 TABLET BY MOUTH EVERY DAY 90 tablet 1   VYVANSE 20 MG capsule Take 20 mg by mouth daily.     VYVANSE 50 MG capsule Take 50 mg by mouth daily.     Current Facility-Administered Medications  Medication Dose Route Frequency Provider Last Rate Last Admin   cloNIDine  (CATAPRES ) tablet 0.1 mg  0.1 mg Oral Daily [name], Dinah C, NP   0.1 mg at 08/06/23 1426    Review of Systems  Constitutional: negative Eyes: negative Ears, nose, mouth, throat, and face: negative Respiratory: negative Cardiovascular: negative Gastrointestinal: negative Genitourinary:negative Integument/breast: negative Hematologic/lymphatic: negative Musculoskeletal:positive for arthralgias Neurological: negative Behavioral/Psych: negative Endocrine: negative Allergic/Immunologic: negative  Physical Exam  [name], healthy, no distress, well nourished, and well developed SKIN: skin color, texture, turgor are normal, no rashes or significant lesions HEAD: Normocephalic, No masses, lesions, tenderness or abnormalities EYES: normal, PERRLA, Conjunctiva are pink and non-injected EARS: External ears normal, Canals clear OROPHARYNX:no exudate, no erythema, and lips, buccal mucosa, and tongue normal  NECK: supple, no adenopathy, no JVD LYMPH:  no palpable lymphadenopathy, no hepatosplenomegaly LUNGS: clear to auscultation , and palpation HEART: regular rate & rhythm, no murmurs, and no gallops ABDOMEN:abdomen soft, non-tender, obese, normal bowel sounds, and no masses or organomegaly BACK: Back symmetric, no curvature., No CVA tenderness EXTREMITIES:no joint deformities, effusion, or inflammation, no edema  NEURO: alert & oriented x 3 with fluent speech, no focal motor/sensory deficits  PERFORMANCE  STATUS: ECOG 0  LABORATORY DATA: Lab Results  Component Value Date   WBC 4.1 11/17/2023   HGB 13.1 11/17/2023   HCT 38.0 (L) 11/17/2023   MCV 83.9 11/17/2023   PLT 189 11/17/2023      Chemistry      Component Value Date/Time   NA 138 11/17/2023 1057   NA 142 10/17/2021 1442   K 4.0 11/17/2023 1057   CL 103 11/17/2023 1057   CO2 30 11/17/2023 1057   BUN 24 (H) 11/17/2023 1057   BUN 16 10/17/2021 1442   CREATININE 1.52 (H) 11/17/2023 1057   CREATININE 1.37 (H) 09/25/2023 1115      Component Value Date/Time   CALCIUM  9.5 11/17/2023 1057   ALKPHOS 55 11/17/2023 1057   AST 11 (L) 11/17/2023 1057   ALT 14 11/17/2023 1057   BILITOT 1.0 11/17/2023 1057       RADIOGRAPHIC STUDIES: No results found.  ASSESSMENT AND PLAN: This is a very pleasant 48 years old African-American male presented for evaluation of leukocytopenia and thrombocytopenia seen on recent blood work and completely resolved at this point. Assessment and Plan    Thrombocytopenia Chronic low platelet count with recent normalization. Platelet  count fluctuated over several years, with a low of 117,000 in November 2024 and normalization to 189,000 today. Possible contributing factors include ibuprofen use and alcohol consumption. No signs of active bleeding or bruising. Discussed risks of continued ibuprofen use, including potential nephrotoxicity and further platelet count issues. Advised reducing alcohol consumption to prevent hepatotoxicity-related platelet issues. - Advise reducing ibuprofen use to prevent nephrotoxicity and further platelet count issues - Advise reducing alcohol consumption to prevent hepatotoxicity-related platelet issues - No need for regular follow-up unless significant decline in counts occurs  Leukopenia Transient leukopenia noted in November 2024 (3.6), likely related to concurrent antibiotic use for leg wound infection. Current WBC count is normal (4.1). 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 Under wound care management and treated with antibiotics. Discussed the importance of managing diabetes to prevent further complications. - Continue follow-up with wound care clinic - Monitor for signs of infection and manage diabetes to prevent further complications  General Health Maintenance Multiple chronic conditions including hypertension, diabetes, ADHD, migraine, sleep apnea, anxiety, and depression. Family history includes hypertension, kidney disease, and breast cancer. Discussed the importance of lifestyle modifications to manage chronic conditions and reduce risk factors. - Monitor kidney function due to diabetes and hypertension - Advise on lifestyle modifications to manage chronic conditions - Provide education on reducing alcohol and ibuprofen use  Follow-up - Provide contact information for future concerns - Advise return if significant decline in blood counts or other major issues arise.    The patient voices understanding of current disease status and treatment options and is in agreement with the current care plan.  All questions were answered. The patient knows to call the clinic with any problems, questions or concerns. We can certainly see the patient much sooner if necessary.  Thank you so much for allowing me to participate in the care of [name] I will continue to follow up the patient with you and assist in his care. The total time spent in the appointment was 60 minutes.  Disclaimer: This note was dictated with voice recognition software. Similar sounding words can inadvertently be transcribed and may not be corrected upon review.   Sherrod MARLA Sherrod November 17, 2023, 12:22 PM

## 2023-11-19 NOTE — Progress Notes (Signed)
 AMOR, PACKARD B ([id number]) [id number] Page 1 of 7 Visit Report for 11/17/2023 Arrival Information Details Patient Name: Date of Service: [name], [place] RD B. 11/17/2023 9:15 A M Medical Record Number: [id number] Patient Account Number: [id number] Date of Birth/Sex: Treating RN: 1975/11/15 (48 y.o. M) Primary Care Osceola Holian: Leonarda Burdock Other Clinician: Referring Alayia Meggison: Treating Arne Schlender/Extender: Marolyn Delon Leonarda, Dinah Weeks in Treatment: 12 Visit Information History Since Last Visit Added or deleted any medications: No Patient Arrived: Ambulatory Any new allergies or adverse reactions: No Arrival Time: 09:32 Had a fall or experienced change in No Accompanied By: self activities of daily living that may affect Transfer Assistance: None risk of falls: Patient Identification Verified: Yes Signs or symptoms of abuse/neglect since last visito No Secondary Verification Process Completed: Yes Hospitalized since last visit: No Patient Requires Transmission-Based Precautions: No Implantable device outside of the clinic excluding No Patient Has Alerts: No cellular tissue based products placed in the center since last visit: Has Dressing in Place as Prescribed: Yes Has Compression in Place as Prescribed: Yes Pain Present Now: No Electronic Signature(s) Signed: 11/17/2023 5:09:31 PM By: Merleen Handing RN, BSN Entered By: Boehlein, Linda on 11/17/2023 09:42:07 -------------------------------------------------------------------------------- Compression Therapy Details Patient Name: Date of Service: [name], [name] 11/17/2023 9:15 A M Medical Record Number: [id number] Patient Account Number: [id number] Date of Birth/Sex: Treating RN: 09/03/1976 (48 y.o. [name] Primary Care Sharah Finnell: [name], Dinah Other Clinician: Referring Attilio Zeitler: Treating Amorie Rentz/Extender: Marolyn Delon Leonarda Burdock Devra in Treatment: 12 Compression Therapy  Performed for Wound Assessment: Wound #2 Right,Anterior Lower Leg Performed By: Clinician Merleen Handing, RN Compression Type: Double Layer Post Procedure Diagnosis Same as Pre-procedure Notes urgo lite Electronic Signature(s) Signed: 11/17/2023 5:09:31 PM By: Merleen Handing RN, BSN Entered By: Boehlein, Linda on 11/17/2023 09:45:30 [name] ([id number]) [id number] Page 2 of 7 -------------------------------------------------------------------------------- Encounter Discharge Information Details Patient Name: Date of Service: [name] NORDMANN RD B. 11/17/2023 9:15 A M Medical Record Number: [id number] Patient Account Number: [id number] Date of Birth/Sex: Treating RN: Apr 09, 1976 (48 y.o. [name] Primary Care [name]: Leonarda Burdock Other Clinician: Referring [name]: Treating [name]/Extender: Marolyn Delon Leonarda Burdock Devra in Treatment: 12 Encounter Discharge Information Items Discharge Condition: Stable Ambulatory Status: Ambulatory Discharge Destination: Home Transportation: Private Auto Accompanied By: self Schedule Follow-up Appointment: Yes Clinical Summary of Care: Patient Declined Electronic Signature(s) Signed: 11/17/2023 5:09:31 PM By: Merleen Handing RN, BSN Entered By: Merleen [name] on 11/17/2023 10:00:55 -------------------------------------------------------------------------------- Lower Extremity Assessment Details Patient Name: Date of Service: [name], [name] 11/17/2023 9:15 A M Medical Record Number: [id number] Patient Account Number: [id number] Date of Birth/Sex: Treating RN: October 13, 1976 (48 y.o. [name] Primary Care [name]: Leonarda Burdock Other Clinician: Referring [name]: Treating [name]/Extender: Marolyn Delon Leonarda, Dinah Weeks in Treatment: 12 Edema Assessment Assessed: [Left: No] [Right: No] Edema: [Left: Ye] [Right: s] Calf Left: Right: Point of Measurement: From Medial Instep  34.5 cm Ankle Left: Right: Point of Measurement: From Medial Instep 24.3 cm Vascular Assessment Pulses: Dorsalis Pedis Palpable: [Right:Yes] Extremity colors, hair growth, and conditions: Extremity Color: [Right:Hyperpigmented] Hair Growth on Extremity: [Right:Yes] Temperature of Extremity: [Right:Warm] Capillary Refill: [Right:< 3 seconds] Dependent Rubor: [Right:No No] Electronic Signature(sJASPER, [name] ([id number]) [id number] Page 3 of 7 Signed: 11/17/2023 5:09:31 PM By: Merleen Handing RN, BSN Entered By: Merleen [name] on 11/17/2023 09:43:55 -------------------------------------------------------------------------------- Multi Wound Chart Details Patient Name: Date of Service: [name], [name] 11/17/2023 9:15 A M Medical Record Number: [id number] Patient Account Number: [id number]  Date of Birth/Sex: Treating RN: 06-Nov-1975 (48 y.o. [name] Primary Care Leshon Armistead: Leonarda Burdock Other Clinician: Referring [name]: Treating [name]/Extender: Marolyn Delon Leonarda, Dinah Weeks in Treatment: 12 Vital Signs Height(in): 69 Pulse(bpm): 103 Weight(lbs): 330 Blood Pressure(mmHg): 154/101 Body Mass Index(BMI): 48.7 Temperature(F): 98.3 Respiratory Rate(breaths/min): 18 [2:Photos:] [N/A:N/A] Right, Anterior Lower Leg N/A N/A Wound Location: Bump N/A N/A Wounding Event: Diabetic Wound/Ulcer of the Lower N/A N/A Primary Etiology: Extremity Sleep Apnea, Hypertension, Type II N/A N/A Comorbid History: Diabetes 07/18/2023 N/A N/A Date Acquired: 12 N/A N/A Weeks of Treatment: Open N/A N/A Wound Status: No N/A N/A Wound Recurrence: 1.5x0.8x0.1 N/A N/A Measurements L x W x D (cm) 0.942 N/A N/A A (cm) : rea 0.094 N/A N/A Volume (cm) : 93.70% N/A N/A % Reduction in A rea: 93.70% N/A N/A % Reduction in Volume: Grade 2 N/A N/A Classification: Medium N/A N/A Exudate A mount: Serosanguineous N/A N/A Exudate Type: red,  brown N/A N/A Exudate Color: Flat and Intact N/A N/A Wound Margin: Large (67-100%) N/A N/A Granulation A mount: Red N/A N/A Granulation Quality: None Present (0%) N/A N/A Necrotic A mount: Fat Layer (Subcutaneous Tissue): Yes N/A N/A Exposed Structures: Fascia: No Tendon: No Muscle: No Joint: No Bone: No Small (1-33%) N/A N/A Epithelialization: No Abnormalities Noted N/A N/A Periwound Skin Texture: No Abnormalities Noted N/A N/A Periwound Skin Moisture: Hemosiderin Staining: Yes N/A N/A Periwound Skin Color: No Abnormality N/A N/A Temperature: Chemical Cauterization N/A N/A Procedures Performed: Compression Therapy Treatment Notes MACARTHUR, LORUSSO B ([id number]) [id number] Page 4 of 7 Electronic Signature(s) Signed: 11/17/2023 9:58:37 AM By: Marolyn Delon MD FACS Signed: 11/17/2023 5:09:31 PM By: Merleen Handing RN, BSN Entered By: Marolyn Delon on 11/17/2023 09:58:37 -------------------------------------------------------------------------------- Multi-Disciplinary Care Plan Details Patient Name: Date of Service: [name], [name] 11/17/2023 9:15 A M Medical Record Number: [id number] Patient Account Number: [id number] Date of Birth/Sex: Treating RN: 1976/01/05 (47 y.o. [name] Primary Care Yassen Kinnett: [name], Dinah Other Clinician: Referring Coutney Wildermuth: Treating Jaclyn Carew/Extender: Marolyn Delon Leonarda Burdock Devra in Treatment: 12 Multidisciplinary Care Plan reviewed with physician Active Inactive Venous Leg Ulcer Nursing Diagnoses: Knowledge deficit related to disease process and management Potential for venous Insuffiency (use before diagnosis confirmed) Goals: Patient will maintain optimal edema control Date Initiated: 11/17/2023 Target Resolution Date: 12/15/2023 Goal Status: Active Interventions: Assess peripheral edema status every visit. Compression as ordered Treatment Activities: Therapeutic compression applied :  11/17/2023 Notes: Wound/Skin Impairment Nursing Diagnoses: Knowledge deficit related to ulceration/compromised skin integrity Goals: Patient/caregiver will verbalize understanding of skin care regimen Date Initiated: 08/20/2023 Target Resolution Date: 12/04/2023 Goal Status: Active Interventions: Assess patient/caregiver ability to obtain necessary supplies Assess patient/caregiver ability to perform ulcer/skin care regimen upon admission and as needed Assess ulceration(s) every visit Provide education on ulcer and skin care Screen for HBO Treatment Activities: Skin care regimen initiated : 08/20/2023 Topical wound management initiated : 08/20/2023 Notes: Electronic Signature(s) Signed: 11/17/2023 5:09:31 PM By: Merleen Handing RN, BSN Entered By: Merleen [name] on 11/17/2023 09:32:03 [name] ([id number]) [id number] Page 5 of 7 -------------------------------------------------------------------------------- Pain Assessment Details Patient Name: Date of Service: [name] NORDMANN RD B. 11/17/2023 9:15 A M Medical Record Number: [id number] Patient Account Number: [id number] Date of Birth/Sex: Treating RN: [birth year] (48 y.o. M) Primary Care Timohty Renbarger: Leonarda Burdock Other Clinician: Referring Ladaja Yusupov: Treating Kaylynne Andres/Extender: Marolyn Delon Leonarda, Dinah Weeks in Treatment: 12 Active Problems Location of Pain Severity and Description of Pain Patient Has Paino No Site Locations Rate the pain. Current Pain Level: 0 Pain  Management and Medication Current Pain Management: Electronic Signature(s) Signed: 11/17/2023 5:09:31 PM By: Merleen Handing RN, BSN Entered By: Boehlein, Linda on 11/17/2023 09:42:29 -------------------------------------------------------------------------------- Patient/Caregiver Education Details Patient Name: Date of Service: [name] LITTIE NORDMANN RD B. 1/14/2025andnbsp9:15 A M Medical Record Number: [id number] Patient Account Number:  [id number] Date of Birth/Gender: Treating RN: 24-Oct-1976 (47 y.o. [name] Primary Care Physician: [name], Dinah Other Clinician: Referring Physician: Treating Physician/Extender: Marolyn Delon Leonarda Roxan Devra in Treatment: 12 Education Assessment Education Provided To: Patient Education Topics Provided Venous: Methods: Explain/Verbal Responses: Reinforcements needed, State content correctly THIAGO, RAGSDALE B ([id number]) [id number] Page 6 of 7 Wound/Skin Impairment: Methods: Explain/Verbal Responses: Reinforcements needed, State content correctly Electronic Signature(s) Signed: 11/17/2023 5:09:31 PM By: Merleen Handing RN, BSN Entered By: Boehlein, Linda on 11/17/2023 09:32:21 -------------------------------------------------------------------------------- Wound Assessment Details Patient Name: Date of Service: [name] LITTIE, [name] 11/17/2023 9:15 A M Medical Record Number: [id number] Patient Account Number: [id number] Date of Birth/Sex: Treating RN: June 25, 1976 (48 y.o. M) Primary Care Channing Yeager: Leonarda Roxan Other Clinician: Referring Thomasene Dubow: Treating Havish Petties/Extender: Marolyn Delon Leonarda, Dinah Weeks in Treatment: 12 Wound Status Wound Number: 2 Primary Etiology: Diabetic Wound/Ulcer of the Lower Extremity Wound Location: Right, Anterior Lower Leg Wound Status: Open Wounding Event: Bump Comorbid History: Sleep Apnea, Hypertension, Type II Diabetes Date Acquired: 07/18/2023 Weeks Of Treatment: 12 Clustered Wound: No Photos Wound Measurements Length: (cm) 1.5 Width: (cm) 0.8 Depth: (cm) 0.1 Area: (cm) 0.942 Volume: (cm) 0.094 % Reduction in Area: 93.7% % Reduction in Volume: 93.7% Epithelialization: Small (1-33%) Tunneling: No Undermining: No Wound Description Classification: Grade 2 Wound Margin: Flat and Intact Exudate Amount: Medium Exudate Type: Serosanguineous Exudate Color: red, brown Foul Odor After  Cleansing: No Slough/Fibrino No Wound Bed Granulation Amount: Large (67-100%) Exposed Structure Granulation Quality: Red Fascia Exposed: No Necrotic Amount: None Present (0%) Fat Layer (Subcutaneous Tissue) Exposed: Yes Tendon Exposed: No Muscle Exposed: No Joint Exposed: No Bone Exposed: No Periwound Skin Texture Texture Color Kasler, Audrey B ([id number]) [id number] Page 7 of 7 No Abnormalities Noted: Yes No Abnormalities Noted: No Hemosiderin Staining: Yes Moisture No Abnormalities Noted: Yes Temperature / Pain Temperature: No Abnormality Treatment Notes Wound #2 (Lower Leg) Wound Laterality: Right, Anterior Cleanser Soap and Water Discharge Instruction: May shower and wash wound with dial antibacterial soap and water prior to dressing change. Vashe 5.8 (oz) Discharge Instruction: Cleanse the wound with Vashe prior to applying a clean dressing using gauze sponges, not tissue or cotton balls. Peri-Wound Care Sween Lotion (Moisturizing lotion) Discharge Instruction: Apply moisturizing lotion as directed Topical Primary Dressing Hydrofera Blue Ready Transfer Foam, 4x5 (in/in) Discharge Instruction: Apply to wound bed as instructed Secondary Dressing Woven Gauze Sponge, Non-Sterile 4x4 in Discharge Instruction: Apply over primary dressing as directed. Secured With Compression Wrap Urgo K2 Lite, (equivalent to a 3 layer) two layer compression system, regular Discharge Instruction: Apply Urgo K2 Lite as directed (alternative to 3 layer compression). Compression Stockings Add-Ons Electronic Signature(s) Signed: 11/17/2023 5:09:31 PM By: Merleen Handing RN, BSN Entered By: Boehlein, Linda on 11/17/2023 09:44:51 -------------------------------------------------------------------------------- Vitals Details Patient Name: Date of Service: [name] LITTIE, [name] 11/17/2023 9:15 A M Medical Record Number: [id number] Patient Account Number: [id number] Date of  Birth/Sex: Treating RN: 1976/05/01 (48 y.o. M) Primary Care Charley Miske: Leonarda Roxan Other Clinician: Referring Dodd Schmid: Treating Jamus Loving/Extender: Marolyn Delon Leonarda, Dinah Weeks in Treatment: 12 Vital Signs Time Taken: 09:32 Temperature (F): 98.3 Height (in): 69 Pulse (bpm): 103 Weight (lbs): 330 Respiratory Rate (breaths/min): 18 Body Mass Index (BMI):  48.7 Blood Pressure (mmHg): 154/101 Reference Range: 80 - 120 mg / dl Electronic Signature(s) Signed: 11/17/2023 5:09:31 PM By: Boehlein, Linda RN, BSN Entered By: Boehlein, Linda on 11/17/2023 09:42:19

## 2023-11-19 NOTE — Progress Notes (Signed)
 [name], [name] [name] ([id number]) [id number] Page 1 of 7 Visit Report for 11/17/2023 Chief Complaint Document Details Patient Name: Date of Service: [name] [name] [name] 11/17/2023 9:15 A M Medical Record Number: [id number] Patient Account Number: [id number] Date of Birth/Sex: Treating RN: [birth year] (48 y.o. [name] Primary Care Provider: [name], Dinah Other Clinician: Referring Provider: Treating Provider/Extender: Marolyn Delon Plough, Dinah Weeks in Treatment: 12 Information Obtained from: Patient Chief Complaint 11/23/2018; patient is here for review of an ulcer on the left posterior calf 10/06/2019; patient is here for review of the wound area from the previous visit which is on the left posterior lateral calf 08/20/2023: here for new wound on RLE anterior Electronic Signature(s) Signed: 11/17/2023 10:02:33 AM By: Marolyn Delon MD FACS Entered By: Marolyn Delon on 11/17/2023 10:02:33 -------------------------------------------------------------------------------- HPI Details Patient Name: Date of Service: [name] [name], EDWA [name] [name] 11/17/2023 9:15 A M Medical Record Number: [id number] Patient Account Number: [id number] Date of Birth/Sex: Treating RN: 1976-05-13 (48 y.o. [name] Primary Care Provider: [name], Dinah Other Clinician: Referring Provider: Treating Provider/Extender: Marolyn Delon Plough Roxan Devra in Treatment: 12 History of Present Illness HPI Description: Admission 11/23/2018 This is a 48 year old man with type 2 diabetes. He tells us  that about a week before Thanksgiving he noticed a small painful area on the back of his left mid calf. He saw a physician at urgent care on 10/10/2018 noted to have an open wound on the left calf. He was given Bactroban  and Keflex  as CandS was apparently negative. He saw his own primary doctor on 612/31/19 and was given Silvadene  and doxycycline . He has been using Silvadene  since then. He  is saw his primary physician again on 1/10 was offered an Radio broadcast assistant but I do not think that he actually use this. The area in question is on the left posterior calf. There is some question that this was a insect bite and when he first presented but he certainly did not notice his any particular bite injury at that time. This did actually apparently at one point look as though it was progressing towards closure although it then it opened again. He has been using Silvadene  for the past 3 weeks Past medical history; obstructive sleep apnea, Achilles tendinitis, hypertension, type 2 diabetes, cholecystectomy, fatty liver, ABI in this clinic was 1.38 on the left 11/30/2018; the area is essentially unchanged in size although the surface of the wound looks better. He has surrounding inflammation around the wound the exact etiology of this is unclear. 12/07/2018; the area has come down in diameter. Surface requires debridement. We have been using silver  collagen. Is the wound seems to have come down in size I have elected not to biopsy this. Surrounding inflammation is also less 2/11; the area continues to come down in size and generally looks healthy. We have been using silver  collagen under compression surrounding inflammation is less there is no tenderness 2/18; the area continues to contract in size and looks healthy. We have been using silver  collagen under compression 2/25; no major change in dimension this week. However the wound bed continues to look healthy and there appears to be less inflammation around the wound 3/3; the wound measures slightly smaller. Healthy looking surface. Less inflammation 3/10; wound is closed. Healthy looking surface. Still some discoloration around the wound area but absolutely no tenderness. The cause of this wound was never really determined. However it progressed nicely towards healing READMISSION 10/06/2019 [name], [name] ([id number])  [phone].pdf Page 2 of  7 This is a patient I dealt with with an unusual wound on the left posterior lateral calf in the early part of this year. I was never really completely sure I understood what this was in terms of causation. However wde use silver  collagen under compression and the area responded nicely and we are able to discharge him after roughly 6 weeks in the clinic. He did not clearly have arterial or venous issues. I had some thoughts about biopsying this area however acid continued to get better I did not go through with this. He states that everything is been fine up until the last few weeks. His wife noted a scab with perhaps some purulent drainage. They called primary care and was prescribed doxy which she is finished and everything seems to have settled down there was no open area however the patient wanted us  to look at this. READMISSION 08/20/2023 ***ABIs L: 1.04; R: 1.11*** This is a now 48 year old type II diabetic (last hemoglobin A1c 6.7%) who returns with a new wound on his right lower extremity, anterior tibial surface. He says it began exactly the way he is previous wound that led to wound care center admission started. He was mowing the lawn and developed a bump on his leg. It subsequently broke down resulting in a large ulcer. Patient was seen in the emergency department on 04 August 2023. He was given a prescription for Keflex  and Bactrim . He was referred to the wound care center for further evaluation and management. 08/28/2023: The wound measured a little bit larger today and has a nonviable surface. There is still an odor coming from the wound. Edema control is good. The culture that I took last week grew out a polymicrobial population but all organisms were at fairly low levels. 09/02/2023: The wound size was stable today, but it is markedly cleaner with just a little bit of slough and eschar around the edges. The odor has abated. He has  had quite a bit of drainage. Edema control is excellent. 09/15/2023: The wound was a little bit smaller today and the surface is incredibly clean without any slough or eschar buildup. No odor or significant drainage. Edema control is excellent. 09/22/2023: The wound is smaller with healthy and robust granulation tissue filling in. Edema control remains excellent. 09/29/2023: The wound continues to contract. There is very minimal slough accumulation and the granulation tissue is healthy. Good edema control. 10/27/2023: The wound is smaller again today. The surface is extremely clean with healthy granulation tissue present. Good edema control. 11/03/2023: Continued contraction of the wound. The surface is a little bit hyper granulated and friable. No concern for infection. Edema control is excellent. 11/10/2023: The wound is smaller again today. There is a little bit of slough on the surface and the tissue remains slightly hyper granulated, but it is not particularly friable. Edema control remains excellent. 11/17/2023: The wound measured smaller again today. It is very clean with really no slough present. The tissue is hyper granulated, however. Electronic Signature(s) Signed: 11/17/2023 10:03:06 AM By: Marolyn Nest MD FACS Entered By: Marolyn Nest on 11/17/2023 10:03:06 -------------------------------------------------------------------------------- Chemical Cauterization Details Patient Name: Date of Service: [name] CROME, EDWA [name] [name] 11/17/2023 9:15 A M Medical Record Number: [id number] Patient Account Number: [id number] Date of Birth/Sex: Treating RN: 08-Jul-1976 (48 y.o. [name] Primary Care Provider: [name], Dinah Other Clinician: Referring Provider: Treating Provider/Extender: Marolyn Nest Leonarda Roxan Devra in Treatment: 12 Procedure Performed for: Wound #2 Right,Anterior Lower Leg Performed By: Physician Marolyn,  Delon, MD The following information was scribed by:  Merleen Handing The information was scribed for: Marolyn Delon Post Procedure Diagnosis Same as Pre-procedure Notes using silver  nitrate stick Electronic Signature(s) Signed: 11/17/2023 10:09:11 AM By: Marolyn Delon MD FACS Signed: 11/17/2023 5:09:31 PM By: Merleen Handing RN, BSN Entered By: Merleen Handing on 11/17/2023 09:49:11 [name] ([id number]) [id number] Page 3 of 7 -------------------------------------------------------------------------------- Physical Exam Details Patient Name: Date of Service: [name] [name] [name] 11/17/2023 9:15 A M Medical Record Number: [id number] Patient Account Number: [id number] Date of Birth/Sex: Treating RN: [birth year] (48 y.o. [name] Primary Care Provider: [name], Dinah Other Clinician: Referring Provider: Treating Provider/Extender: Marolyn Delon [name], Dinah Weeks in Treatment: 12 Constitutional Hypertensive, asymptomatic. Slightly tachycardic. . . no acute distress. Respiratory Normal work of breathing on room air.. Notes 11/17/2023: The wound measured smaller again today. It is very clean with really no slough present. The tissue is hyper granulated, however. Electronic Signature(s) Signed: 11/17/2023 10:03:45 AM By: Marolyn Delon MD FACS Entered By: Marolyn Delon on 11/17/2023 10:03:45 -------------------------------------------------------------------------------- Physician Orders Details Patient Name: Date of Service: [name] [name], EDWA [name] [name] 11/17/2023 9:15 A M Medical Record Number: [id number] Patient Account Number: [id number] Date of Birth/Sex: Treating RN: [birth year] (48 y.o. [name] Primary Care Provider: [name], Dinah Other Clinician: Referring Provider: Treating Provider/Extender: Marolyn Delon Leonarda Roxan Devra in Treatment: 12 The following information was scribed by: Merleen Handing The information was scribed for: Marolyn Delon Verbal / Phone Orders:  No Diagnosis Coding ICD-10 Coding Code Description L97.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 with unspecified severity E11.622 Type 2 diabetes mellitus with other skin ulcer E66.01 Morbid (severe) obesity due to excess calories Follow-up Appointments ppointment in 1 week. - Dr. Marolyn 11/24/23 at 9:15 am Return A Anesthetic (In clinic) Topical Lidocaine  4% applied to wound bed Bathing/ Shower/ Hygiene May shower with protection but do not get wound dressing(s) wet. Protect dressing(s) with water repellant cover (for example, large plastic bag) or a cast cover and may then take shower. Edema Control - Orders / Instructions Elevate legs to the level of the heart or above for 30 minutes daily and/or when sitting for 3-4 times a day throughout the day. A void standing for long periods of time. Exercise regularly If compression wraps slide down please call wound center and speak with a nurse. Wound Treatment Wound #2 - Lower Leg Wound Laterality: Right, Anterior [name], [name] ([id number]) [id number].pdf Page 4 of 7 Cleanser: Soap and Water 1 x Per Week/30 Days Discharge Instructions: May shower and wash wound with dial antibacterial soap and water prior to dressing change. Cleanser: Vashe 5.8 (oz) 1 x Per Week/30 Days Discharge Instructions: Cleanse the wound with Vashe prior to applying a clean dressing using gauze sponges, not tissue or cotton balls. Peri-Wound Care: Sween Lotion (Moisturizing lotion) 1 x Per Week/30 Days Discharge Instructions: Apply moisturizing lotion as directed Prim Dressing: Hydrofera Blue Ready Transfer Foam, 4x5 (in/in) 1 x Per Week/30 Days ary Discharge Instructions: Apply to wound bed as instructed Secondary Dressing: Woven Gauze Sponge, Non-Sterile 4x4 in 1 x Per Week/30 Days Discharge Instructions: Apply over primary dressing as directed. Compression Wrap: Urgo K2 Lite, (equivalent to a 3 layer) two layer  compression system, regular 1 x Per Week/30 Days Discharge Instructions: Apply Urgo K2 Lite as directed (alternative to 3 layer compression). Electronic Signature(s) Signed: 11/17/2023 10:09:11 AM By: Marolyn Delon MD FACS Entered By: Marolyn Delon on 11/17/2023 10:05:44 -------------------------------------------------------------------------------- Problem  List Details Patient Name: Date of Service: [name] [name] [name] 11/17/2023 9:15 A M Medical Record Number: [id number] Patient Account Number: [id number] Date of Birth/Sex: Treating RN: 05-31-1976 (48 y.o. [name] Primary Care Provider: [name], Dinah Other Clinician: Referring Provider: Treating Provider/Extender: Marolyn Delon Leonarda Roxan Devra in Treatment: 12 Active Problems ICD-10 Encounter Code Description Active Date MDM Diagnosis L97.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 with unspecified 08/20/2023 No Yes severity E11.622 Type 2 diabetes mellitus with other skin ulcer 08/20/2023 No Yes E66.01 Morbid (severe) obesity due to excess calories 08/20/2023 No Yes Inactive Problems Resolved Problems Electronic Signature(s) Signed: 11/17/2023 9:52:53 AM By: Marolyn Delon MD FACS Entered By: Marolyn Delon on 11/17/2023 09:52:53 [name] LOVING [name] ([id number]) [id number] Page 5 of 7 -------------------------------------------------------------------------------- Progress Note Details Patient Name: Date of Service: [name] [name] [name] 11/17/2023 9:15 A M Medical Record Number: [id number] Patient Account Number: [id number] Date of Birth/Sex: Treating RN: 20-Sep-1976 (48 y.o. [name] Primary Care Provider: [name], Dinah Other Clinician: Referring Provider: Treating Provider/Extender: Marolyn Delon Leonarda Roxan Devra in Treatment: 12 Subjective Chief Complaint Information obtained from Patient 11/23/2018; patient is here for review of an ulcer  on the left posterior calf 10/06/2019; patient is here for review of the wound area from the previous visit which is on the left posterior lateral calf 08/20/2023: here for new wound on RLE anterior History of Present Illness (HPI) Admission 11/23/2018 This is a 48 year old man with type 2 diabetes. He tells us  that about a week before Thanksgiving he noticed a small painful area on the back of his left mid calf. He saw a physician at urgent care on 10/10/2018 noted to have an open wound on the left calf. He was given Bactroban  and Keflex  as CandS was apparently negative. He saw his own primary doctor on 612/31/19 and was given Silvadene  and doxycycline . He has been using Silvadene  since then. He is saw his primary physician again on 1/10 was offered an Radio broadcast assistant but I do not think that he actually use this. The area in question is on the left posterior calf. There is some question that this was a insect bite and when he first presented but he certainly did not notice his any particular bite injury at that time. This did actually apparently at one point look as though it was progressing towards closure although it then it opened again. He has been using Silvadene  for the past 3 weeks Past medical history; obstructive sleep apnea, Achilles tendinitis, hypertension, type 2 diabetes, cholecystectomy, fatty liver, ABI in this clinic was 1.38 on the left 11/30/2018; the area is essentially unchanged in size although the surface of the wound looks better. He has surrounding inflammation around the wound the exact etiology of this is unclear. 12/07/2018; the area has come down in diameter. Surface requires debridement. We have been using silver  collagen. Is the wound seems to have come down in size I have elected not to biopsy this. Surrounding inflammation is also less 2/11; the area continues to come down in size and generally looks healthy. We have been using silver  collagen under compression surrounding  inflammation is less there is no tenderness 2/18; the area continues to contract in size and looks healthy. We have been using silver  collagen under compression 2/25; no major change in dimension this week. However the wound bed continues to look healthy and there appears to be less inflammation around the wound 3/3; the wound measures slightly  smaller. Healthy looking surface. Less inflammation 3/10; wound is closed. Healthy looking surface. Still some discoloration around the wound area but absolutely no tenderness. The cause of this wound was never really determined. However it progressed nicely towards healing READMISSION 10/06/2019 This is a patient I dealt with with an unusual wound on the left posterior lateral calf in the early part of this year. I was never really completely sure I understood what this was in terms of causation. However wde use silver  collagen under compression and the area responded nicely and we are able to discharge him after roughly 6 weeks in the clinic. He did not clearly have arterial or venous issues. I had some thoughts about biopsying this area however acid continued to get better I did not go through with this. He states that everything is been fine up until the last few weeks. His wife noted a scab with perhaps some purulent drainage. They called primary care and was prescribed doxy which she is finished and everything seems to have settled down there was no open area however the patient wanted us  to look at this. READMISSION 08/20/2023 ***ABIs L: 1.04; R: 1.11*** This is a now 48 year old type II diabetic (last hemoglobin A1c 6.7%) who returns with a new wound on his right lower extremity, anterior tibial surface. He says it began exactly the way he is previous wound that led to wound care center admission started. He was mowing the lawn and developed a bump on his leg. It subsequently broke down resulting in a large ulcer. Patient was seen in the emergency  department on 04 August 2023. He was given a prescription for Keflex  and Bactrim . He was referred to the wound care center for further evaluation and management. 08/28/2023: The wound measured a little bit larger today and has a nonviable surface. There is still an odor coming from the wound. Edema control is good. The culture that I took last week grew out a polymicrobial population but all organisms were at fairly low levels. 09/02/2023: The wound size was stable today, but it is markedly cleaner with just a little bit of slough and eschar around the edges. The odor has abated. He has had quite a bit of drainage. Edema control is excellent. 09/15/2023: The wound was a little bit smaller today and the surface is incredibly clean without any slough or eschar buildup. No odor or significant drainage. Edema control is excellent. 09/22/2023: The wound is smaller with healthy and robust granulation tissue filling in. Edema control remains excellent. 09/29/2023: The wound continues to contract. There is very minimal slough accumulation and the granulation tissue is healthy. Good edema control. 10/27/2023: The wound is smaller again today. The surface is extremely clean with healthy granulation tissue present. Good edema control. 11/03/2023: Continued contraction of the wound. The surface is a little bit hyper granulated and friable. No concern for infection. Edema control is excellent. 11/10/2023: The wound is smaller again today. There is a little bit of slough on the surface and the tissue remains slightly hyper granulated, but it is not particularly friable. Edema control remains excellent. [name], [name] ([id number]) [id number] Page 6 of 7 11/17/2023: The wound measured smaller again today. It is very clean with really no slough present. The tissue is hyper granulated, however. Objective Constitutional Hypertensive, asymptomatic. Slightly tachycardic. no acute  distress. Vitals Time Taken: 9:32 AM, Height: 69 in, Weight: 330 lbs, BMI: 48.7, Temperature: 98.3 F, Pulse: 103 bpm, Respiratory Rate: 18 breaths/min, Blood Pressure: 154/101 mmHg.  Respiratory Normal work of breathing on room air.. General Notes: 11/17/2023: The wound measured smaller again today. It is very clean with really no slough present. The tissue is hyper granulated, however. Integumentary (Hair, Skin) Wound #2 status is Open. Original cause of wound was Bump. The date acquired was: 07/18/2023. The wound has been in treatment 12 weeks. The wound is located on the Right,Anterior Lower Leg. The wound measures 1.5cm length x 0.8cm width x 0.1cm depth; 0.942cm^2 area and 0.094cm^3 volume. There is Fat Layer (Subcutaneous Tissue) exposed. There is no tunneling or undermining noted. There is a medium amount of serosanguineous drainage noted. The wound margin is flat and intact. There is large (67-100%) red granulation within the wound bed. There is no necrotic tissue within the wound bed. The periwound skin appearance had no abnormalities noted for texture. The periwound skin appearance had no abnormalities noted for moisture. The periwound skin appearance exhibited: Hemosiderin Staining. Periwound temperature was noted as No Abnormality. Assessment Active Problems ICD-10 Non-pressure chronic ulcer of other part of right lower leg with unspecified severity Type 2 diabetes mellitus with other skin ulcer Morbid (severe) obesity due to excess calories Procedures Wound #2 Pre-procedure diagnosis of Wound #2 is a Diabetic Wound/Ulcer of the Lower Extremity located on the Right,Anterior Lower Leg . There was a Double Layer Compression Therapy Procedure by Merleen Handing, RN. Post procedure Diagnosis Wound #2: Same as Pre-Procedure Notes: urgo lite. Pre-procedure diagnosis of Wound #2 is a Diabetic Wound/Ulcer of the Lower Extremity located on the Right,Anterior Lower Leg . An Chemical  Cauterization procedure was performed by Marolyn Nest, MD. Post procedure Diagnosis Wound #2: Same as Pre-Procedure Notes: using silver  nitrate stick Plan Follow-up Appointments: Return Appointment in 1 week. - Dr. Marolyn 11/24/23 at 9:15 am Anesthetic: (In clinic) Topical Lidocaine  4% applied to wound bed Bathing/ Shower/ Hygiene: May shower with protection but do not get wound dressing(s) wet. Protect dressing(s) with water repellant cover (for example, large plastic bag) or a cast cover and may then take shower. Edema Control - Orders / Instructions: Elevate legs to the level of the heart or above for 30 minutes daily and/or when sitting for 3-4 times a day throughout the day. Avoid standing for long periods of time. Exercise regularly If compression wraps slide down please call wound center and speak with a nurse. WOUND #2: - Lower Leg Wound Laterality: Right, Anterior Cleanser: Soap and Water 1 x Per Week/30 Days Discharge Instructions: May shower and wash wound with dial antibacterial soap and water prior to dressing change. Cleanser: Vashe 5.8 (oz) 1 x Per Week/30 Days Discharge Instructions: Cleanse the wound with Vashe prior to applying a clean dressing using gauze sponges, not tissue or cotton balls. Peri-Wound Care: Sween Lotion (Moisturizing lotion) 1 x Per Week/30 Days [name], [name] ([id number]) [id number] Page 7 of 7 Discharge Instructions: Apply moisturizing lotion as directed Prim Dressing: Hydrofera Blue Ready Transfer Foam, 4x5 (in/in) 1 x Per Week/30 Days ary Discharge Instructions: Apply to wound bed as instructed Secondary Dressing: Woven Gauze Sponge, Non-Sterile 4x4 in 1 x Per Week/30 Days Discharge Instructions: Apply over primary dressing as directed. Com pression Wrap: Urgo K2 Lite, (equivalent to a 3 layer) two layer compression system, regular 1 x Per Week/30 Days Discharge Instructions: Apply Urgo K2 Lite as directed  (alternative to 3 layer compression). 11/17/2023: The wound measured smaller again today. It is very clean with really no slough present. The tissue is hyper granulated, however. No debridement was necessary. I  did chemically cauterize the hypertrophic granulation tissue with silver  nitrate. We will continue Hydrofera Blue and Urgo lite 3 layer compression equivalent. Follow-up in 1 week. Electronic Signature(s) Signed: 11/17/2023 10:06:24 AM By: Marolyn Nest MD FACS Entered By: Marolyn Nest on 11/17/2023 10:06:24 -------------------------------------------------------------------------------- SuperBill Details Patient Name: Date of Service: [name] CROME, EDWA [name] [name] 11/17/2023 Medical Record Number: [id number] Patient Account Number: [id number] Date of Birth/Sex: Treating RN: July 15, 1976 (48 y.o. [name] Primary Care Provider: Leonarda Burdock Other Clinician: Referring Provider: Treating Provider/Extender: Marolyn Nest Leonarda, Dinah Weeks in Treatment: 12 Diagnosis Coding ICD-10 Codes Code Description [phone] Non-pressure chronic ulcer of other part of right lower leg with unspecified severity E11.622 Type 2 diabetes mellitus with other skin ulcer E66.01 Morbid (severe) obesity due to excess calories Facility Procedures : CPT4 Code: [id number] Description: 17250 - CHEM CAUT GRANULATION TISS ICD-10 Diagnosis Description L97.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 with unspecified sev E11.622 Type 2 diabetes mellitus with other skin ulcer E66.01 Morbid (severe) obesity  due to excess calories Modifier: erity Quantity: 1 Physician Procedures : CPT4 Code Description Modifier [id number] 17250 - WC PHYS CHEM CAUT GRAN TISSUE ICD-10 Diagnosis Description L97.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 with unspecified severity E11.622 Type 2 diabetes mellitus with other skin  ulcer E66.01 Morbid (severe) obesity due to excess  calories Quantity: 1 Electronic Signature(s) Signed: 11/17/2023 10:06:40 AM By: Marolyn Nest MD FACS Entered By: Marolyn Nest on 11/17/2023 10:06:39

## 2023-11-22 ENCOUNTER — Other Ambulatory Visit: Payer: Self-pay | Admitting: Medical

## 2023-11-24 ENCOUNTER — Encounter (HOSPITAL_BASED_OUTPATIENT_CLINIC_OR_DEPARTMENT_OTHER): Payer: BC Managed Care – PPO | Admitting: General Surgery

## 2023-11-24 DIAGNOSIS — E11622 Type 2 diabetes mellitus with other skin ulcer: Secondary | ICD-10-CM | POA: Diagnosis not present

## 2023-11-24 DIAGNOSIS — L97812 Non-pressure chronic ulcer of other part of right lower leg with fat layer exposed: Secondary | ICD-10-CM | POA: Diagnosis not present

## 2023-11-24 DIAGNOSIS — Z6841 Body Mass Index (BMI) 40.0 and over, adult: Secondary | ICD-10-CM | POA: Diagnosis not present

## 2023-11-24 NOTE — Progress Notes (Signed)
KEM, ALESSI B ([id number]) [id number] Page 1 of 7 Visit Report for 11/24/2023 Arrival Information Details Patient Name: Date of Service: [name] RD B. 11/24/2023 9:15 A M Medical Record Number: [id number] Patient Account Number: [id number] Date of Birth/Sex: Treating RN: 1976/09/17 (48 y.o. M) Primary Care Tali Cleaves: Richarda Blade Other Clinician: Referring Brissa Asante: Treating Jamaris Biernat/Extender: Jason Nest, Dinah Weeks in Treatment: 13 Visit Information History Since Last Visit Added or deleted any medications: No Patient Arrived: Ambulatory Any new allergies or adverse reactions: No Arrival Time: 09:34 Had a fall or experienced change in No Accompanied By: self activities of daily living that may affect Transfer Assistance: None risk of falls: Patient Identification Verified: Yes Signs or symptoms of abuse/neglect since last visito No Secondary Verification Process Completed: Yes Hospitalized since last visit: No Patient Requires Transmission-Based Precautions: No Implantable device outside of the clinic excluding No Patient Has Alerts: No cellular tissue based products placed in the center since last visit: Pain Present Now: No Electronic Signature(s) Signed: 11/24/2023 10:40:19 AM By: Dayton Scrape Entered By: Dayton Scrape on 11/24/2023 09:35:02 -------------------------------------------------------------------------------- Compression Therapy Details Patient Name: Date of Service: [name], [name] 11/24/2023 9:15 A M Medical Record Number: [id number] Patient Account Number: [id number] Date of Birth/Sex: Treating RN: February 07, 1976 (48 y.o. [name] Primary Care Aeneas Longsworth: Richarda Blade Other Clinician: Referring Riona Lahti: Treating Aydenn Gervin/Extender: Pleas Patricia in Treatment: 13 Compression Therapy Performed for Wound Assessment: Wound #2 Right,Anterior Lower Leg Performed By: Clinician Karie Schwalbe, RN Compression Type: Three Layer Post Procedure Diagnosis Same as Pre-procedure Electronic Signature(s) Signed: 11/24/2023 4:21:14 PM By: Karie Schwalbe RN Entered By: Karie Schwalbe on 11/24/2023 09:49:35 Encounter Discharge Information Details -------------------------------------------------------------------------------- [name] ([id number]) [id number] Page 2 of 7 Patient Name: Date of Service: [name] RD B. 11/24/2023 9:15 A M Medical Record Number: [id number] Patient Account Number: [id number] Date of Birth/Sex: Treating RN: [birth year] (48 y.o. [name] Primary Care Errika Narvaiz: Richarda Blade Other Clinician: Referring Abbegale Stehle: Treating Chella Chapdelaine/Extender: Pleas Patricia in Treatment: 13 Encounter Discharge Information Items Discharge Condition: Stable Ambulatory Status: Ambulatory Discharge Destination: Home Transportation: Private Auto Accompanied By: self Schedule Follow-up Appointment: Yes Clinical Summary of Care: Patient Declined Electronic Signature(s) Signed: 11/24/2023 4:21:14 PM By: Karie Schwalbe RN Entered By: Karie Schwalbe on 11/24/2023 15:56:03 -------------------------------------------------------------------------------- Lower Extremity Assessment Details Patient Name: Date of Service: [name], [name] 11/24/2023 9:15 A M Medical Record Number: [id number] Patient Account Number: [id number] Date of Birth/Sex: Treating RN: February 05, 1976 (48 y.o. [name] Primary Care Maalle Starrett: Richarda Blade Other Clinician: Referring Journee Kohen: Treating Dyneisha Murchison/Extender: Jason Nest, Dinah Weeks in Treatment: 13 Edema Assessment Assessed: [Left: No] [Right: No] Edema: [Left: Ye] [Right: s] Calf Left: Right: Point of Measurement: From Medial Instep 34.5 cm Ankle Left: Right: Point of Measurement: From Medial Instep 24.3 cm Vascular Assessment Pulses: Dorsalis  Pedis Palpable: [Right:Yes] Extremity colors, hair growth, and conditions: Extremity Color: [Right:Hyperpigmented] Hair Growth on Extremity: [Right:Yes] Temperature of Extremity: [Right:Warm] Capillary Refill: [Right:< 3 seconds] Dependent Rubor: [Right:No No] Electronic Signature(s) Signed: 11/24/2023 4:21:14 PM By: Karie Schwalbe RN Entered By: Karie Schwalbe on 11/24/2023 09:41:27 Silvestre Moment B ([id number]) [id number] Page 3 of 7 -------------------------------------------------------------------------------- Multi Wound Chart Details Patient Name: Date of Service: [name] RD B. 11/24/2023 9:15 A M Medical Record Number: [id number] Patient Account Number: [id number] Date of Birth/Sex: Treating RN: June 14, 1976 (48 y.o. M) Primary Care Leauna Sharber: Richarda Blade Other Clinician: Referring Lavonne Kinderman: Treating Lidya Mccalister/Extender: Duanne Guess  [name], Dinah Weeks in Treatment: 13 Vital Signs Height(in): 69 Pulse(bpm): 96 Weight(lbs): 330 Blood Pressure(mmHg): 145/88 Body Mass Index(BMI): 48.7 Temperature(F): 98.3 Respiratory Rate(breaths/min): 18 [2:Photos:] [N/A:N/A] Right, Anterior Lower Leg N/A N/A Wound Location: Bump N/A N/A Wounding Event: Diabetic Wound/Ulcer of the Lower N/A N/A Primary Etiology: Extremity Sleep Apnea, Hypertension, Type II N/A N/A Comorbid History: Diabetes 07/18/2023 N/A N/A Date Acquired: 13 N/A N/A Weeks of Treatment: Open N/A N/A Wound Status: No N/A N/A Wound Recurrence: 2.5x1.5x0.1 N/A N/A Measurements L x W x D (cm) 2.945 N/A N/A A (cm) : rea 0.295 N/A N/A Volume (cm) : 80.20% N/A N/A % Reduction in A rea: 80.10% N/A N/A % Reduction in Volume: Grade 2 N/A N/A Classification: Medium N/A N/A Exudate A mount: Serosanguineous N/A N/A Exudate Type: red, brown N/A N/A Exudate Color: Flat and Intact N/A N/A Wound Margin: Large (67-100%) N/A N/A Granulation A mount: Red N/A N/A Granulation  Quality: Small (1-33%) N/A N/A Necrotic A mount: Fat Layer (Subcutaneous Tissue): Yes N/A N/A Exposed Structures: Fascia: No Tendon: No Muscle: No Joint: No Bone: No Small (1-33%) N/A N/A Epithelialization: No Abnormalities Noted N/A N/A Periwound Skin Texture: No Abnormalities Noted N/A N/A Periwound Skin Moisture: Hemosiderin Staining: Yes N/A N/A Periwound Skin Color: No Abnormality N/A N/A Temperature: Chemical Cauterization N/A N/A Procedures Performed: Compression Therapy Treatment Notes Electronic Signature(s) Signed: 11/24/2023 9:59:23 AM By: Duanne Guess MD FACS Entered By: Duanne Guess on 11/24/2023 09:59:23 Silvestre Moment B ([id number]) [id number] Page 4 of 7 -------------------------------------------------------------------------------- Multi-Disciplinary Care Plan Details Patient Name: Date of Service: [name] RD B. 11/24/2023 9:15 A M Medical Record Number: [id number] Patient Account Number: [id number] Date of Birth/Sex: Treating RN: 04/10/1976 (47 y.o. [name] Primary Care Rosana Farnell: Richarda Blade Other Clinician: Referring Manolito Jurewicz: Treating Phong Isenberg/Extender: Pleas Patricia in Treatment: 13 Multidisciplinary Care Plan reviewed with physician Active Inactive Venous Leg Ulcer Nursing Diagnoses: Knowledge deficit related to disease process and management Potential for venous Insuffiency (use before diagnosis confirmed) Goals: Patient will maintain optimal edema control Date Initiated: 11/17/2023 Target Resolution Date: 02/01/2024 Goal Status: Active Interventions: Assess peripheral edema status every visit. Compression as ordered Treatment Activities: Therapeutic compression applied : 11/17/2023 Notes: Wound/Skin Impairment Nursing Diagnoses: Knowledge deficit related to ulceration/compromised skin integrity Goals: Patient/caregiver will verbalize understanding of skin care  regimen Date Initiated: 08/20/2023 Target Resolution Date: 02/01/2024 Goal Status: Active Interventions: Assess patient/caregiver ability to obtain necessary supplies Assess patient/caregiver ability to perform ulcer/skin care regimen upon admission and as needed Assess ulceration(s) every visit Provide education on ulcer and skin care Screen for HBO Treatment Activities: Skin care regimen initiated : 08/20/2023 Topical wound management initiated : 08/20/2023 Notes: Electronic Signature(s) Signed: 11/24/2023 4:21:14 PM By: Karie Schwalbe RN Entered By: Karie Schwalbe on 11/24/2023 15:55:04 Silvestre Moment B ([id number]) [id number] Page 5 of 7 -------------------------------------------------------------------------------- Pain Assessment Details Patient Name: Date of Service: [name] RD B. 11/24/2023 9:15 A M Medical Record Number: [id number] Patient Account Number: [id number] Date of Birth/Sex: Treating RN: 1976-07-02 (47 y.o. M) Primary Care Amijah Timothy: Richarda Blade Other Clinician: Referring Harlan Ervine: Treating Yeraldi Fidler/Extender: Jason Nest, Dinah Weeks in Treatment: 13 Active Problems Location of Pain Severity and Description of Pain Patient Has Paino No Site Locations Pain Management and Medication Current Pain Management: Electronic Signature(s) Signed: 11/24/2023 10:40:19 AM By: Dayton Scrape Entered By: Dayton Scrape on 11/24/2023 09:35:31 -------------------------------------------------------------------------------- Patient/Caregiver Education Details Patient Name: Date of Service: [name], [name] 1/21/2025andnbsp9:15 A M Medical Record Number:  [id number] Patient Account Number: [id number] Date of Birth/Gender: Treating RN: 1976/04/23 (48 y.o. [name] Primary Care Physician: Richarda Blade Other Clinician: Referring Physician: Treating Physician/Extender: Pleas Patricia in Treatment:  13 Education Assessment Education Provided To: Patient Education Topics Provided Wound/Skin Impairment: Methods: Explain/Verbal Responses: State content correctly [employer]) Signed: 11/24/2023 4:21:14 PM By: Karie Schwalbe RN Entered By: Karie Schwalbe on 11/24/2023 15:55:19 [name] ([id number]) [id number] Page 6 of 7 -------------------------------------------------------------------------------- Wound Assessment Details Patient Name: Date of Service: [name] RD B. 11/24/2023 9:15 A M Medical Record Number: [id number] Patient Account Number: [id number] Date of Birth/Sex: Treating RN: July 19, 1976 (47 y.o. [name] Primary Care Kalem Rockwell: Richarda Blade Other Clinician: Referring Tynesha Free: Treating Burma Ketcher/Extender: Duanne Guess [name], Dinah Weeks in Treatment: 13 Wound Status Wound Number: 2 Primary Etiology: Diabetic Wound/Ulcer of the Lower Extremity Wound Location: Right, Anterior Lower Leg Wound Status: Open Wounding Event: Bump Comorbid History: Sleep Apnea, Hypertension, Type II Diabetes Date Acquired: 07/18/2023 Weeks Of Treatment: 13 Clustered Wound: No Photos Wound Measurements Length: (cm) 2 Width: (cm) 1 Depth: (cm) 0 Area: (cm) Volume: (cm) .5 % Reduction in Area: 80.2% .5 % Reduction in Volume: 80.1% .1 Epithelialization: Small (1-33%) 2.945 Tunneling: No 0.295 Undermining: No Wound Description Classification: Grade 2 Wound Margin: Flat and Intact Exudate Amount: Medium Exudate Type: Serosanguineous Exudate Color: red, brown Foul Odor After Cleansing: No Slough/Fibrino No Wound Bed Granulation Amount: Large (67-100%) Exposed Structure Granulation Quality: Red Fascia Exposed: No Necrotic Amount: Small (1-33%) Fat Layer (Subcutaneous Tissue) Exposed: Yes Necrotic Quality: Adherent Slough Tendon Exposed: No Muscle Exposed: No Joint Exposed: No Bone Exposed: No Periwound Skin  Texture Texture Color No Abnormalities Noted: Yes No Abnormalities Noted: No Hemosiderin Staining: Yes Moisture No Abnormalities Noted: Yes Temperature / Pain Temperature: No Abnormality Treatment Notes Wound #2 (Lower Leg) Wound Laterality: Right, Anterior JAXXYN, BEAUCHEMIN B ([id number]) [id number] Page 7 of 7 Cleanser Soap and Water Discharge Instruction: May shower and wash wound with dial antibacterial soap and water prior to dressing change. Vashe 5.8 (oz) Discharge Instruction: Cleanse the wound with Vashe prior to applying a clean dressing using gauze sponges, not tissue or cotton balls. Peri-Wound Care Sween Lotion (Moisturizing lotion) Discharge Instruction: Apply moisturizing lotion as directed Topical Primary Dressing Hydrofera Blue Ready Transfer Foam, 4x5 (in/in) Discharge Instruction: Apply to wound bed as instructed Secondary Dressing Woven Gauze Sponge, Non-Sterile 4x4 in Discharge Instruction: Apply over primary dressing as directed. Secured With Compression Wrap Urgo K2 Lite, (equivalent to a 3 layer) two layer compression system, regular Discharge Instruction: Apply Urgo K2 Lite as directed (alternative to 3 layer compression). Compression Stockings Add-Ons Electronic Signature(s) Signed: 11/24/2023 4:21:14 PM By: Karie Schwalbe RN Entered By: Karie Schwalbe on 11/24/2023 09:43:08 -------------------------------------------------------------------------------- Vitals Details Patient Name: Date of Service: [name], [name] 11/24/2023 9:15 A M Medical Record Number: [id number] Patient Account Number: [id number] Date of Birth/Sex: Treating RN: 05-16-1976 (47 y.o. M) Primary Care Juleon Narang: Richarda Blade Other Clinician: Referring Calia Napp: Treating Wynelle Dreier/Extender: Jason Nest, Dinah Weeks in Treatment: 13 Vital Signs Time Taken: 09:35 Temperature (F): 98.3 Height (in): 69 Pulse (bpm): 96 Weight (lbs):  330 Respiratory Rate (breaths/min): 18 Body Mass Index (BMI): 48.7 Blood Pressure (mmHg): 145/88 Reference Range: 80 - 120 mg / dl Electronic Signature(s) Signed: 11/24/2023 10:40:19 AM By: Dayton Scrape Entered By: Dayton Scrape on 11/24/2023 09:35:23

## 2023-11-24 NOTE — Progress Notes (Signed)
[name], PASTERNAK [name] ([id number]) [id number] Page 1 of 7 Visit Report for 11/24/2023 Chief Complaint Document Details Patient Name: Date of Service: [name] Cava RD [name] 11/24/2023 9:15 A M Medical Record Number: [id number] Patient Account Number: [id number] Date of Birth/Sex: Treating RN: 1976/05/27 (48 y.o. M) Primary Care Provider: Richarda Blade Other Clinician: Referring Provider: Treating Provider/Extender: Jason Nest, Dinah Weeks in Treatment: 13 Information Obtained from: Patient Chief Complaint 11/23/2018; patient is here for review of an ulcer on the left posterior calf 10/06/2019; patient is here for review of the wound area from the previous visit which is on the left posterior lateral calf 08/20/2023: here for new wound on RLE anterior Electronic Signature(s) Signed: 11/24/2023 9:59:28 AM By: Duanne Guess MD FACS Entered By: Duanne Guess on 11/24/2023 09:59:28 -------------------------------------------------------------------------------- HPI Details Patient Name: Date of Service: [name], [name] RD [name] 11/24/2023 9:15 A M Medical Record Number: [id number] Patient Account Number: [id number] Date of Birth/Sex: Treating RN: 12-23-1975 (48 y.o. M) Primary Care Provider: Richarda Blade Other Clinician: Referring Provider: Treating Provider/Extender: Jason Nest, Dinah Weeks in Treatment: 13 History of Present Illness HPI Description: Admission 11/23/2018 This is a 48 year old man with type 2 diabetes. He tells [place] that about a week before Thanksgiving he noticed a small painful area on the back of his left mid calf. He saw a physician at urgent care on 10/10/2018 noted to have an open wound on the left calf. He was given Bactroban and Keflex as CandS was apparently negative. He saw his own primary doctor on 612/31/19 and was given Silvadene and doxycycline. He has been using Silvadene since then. He is saw his primary physician again  on 1/10 was offered an Radio broadcast assistant but I do not think that he actually use this. The area in question is on the left posterior calf. There is some question that this was a insect bite and when he first presented but he certainly did not notice his any particular bite injury at that time. This did actually apparently at one point look as though it was progressing towards closure although it then it opened again. He has been using Silvadene for the past 3 weeks Past medical history; obstructive sleep apnea, Achilles tendinitis, hypertension, type 2 diabetes, cholecystectomy, fatty liver, ABI in this clinic was 1.38 on the left 11/30/2018; the area is essentially unchanged in size although the surface of the wound looks better. He has surrounding inflammation around the wound the exact etiology of this is unclear. 12/07/2018; the area has come down in diameter. Surface requires debridement. We have been using silver collagen. Is the wound seems to have come down in size I have elected not to biopsy this. Surrounding inflammation is also less 2/11; the area continues to come down in size and generally looks healthy. We have been using silver collagen under compression surrounding inflammation is less there is no tenderness 2/18; the area continues to contract in size and looks healthy. We have been using silver collagen under compression 2/25; no major change in dimension this week. However the wound bed continues to look healthy and there appears to be less inflammation around the wound 3/3; the wound measures slightly smaller. Healthy looking surface. Less inflammation 3/10; wound is closed. Healthy looking surface. Still some discoloration around the wound area but absolutely no tenderness. The cause of this wound was never really determined. However it progressed nicely towards healing READMISSION 10/06/2019 [name], [name] ([id number]) [phone].pdf Page 2 of 7 This is a  patient I dealt with with an unusual wound on the left posterior lateral calf in the early part of this year. I was never really completely sure I understood what this was in terms of causation. However wde use silver collagen under compression and the area responded nicely and we are able to discharge him after roughly 6 weeks in the clinic. He did not clearly have arterial or venous issues. I had some thoughts about biopsying this area however acid continued to get better I did not go through with this. He states that everything is been fine up until the last few weeks. His wife noted a scab with perhaps some purulent drainage. They called primary care and was prescribed doxy which she is finished and everything seems to have settled down there was no open area however the patient wanted [place] to look at this. READMISSION 08/20/2023 ***ABIs L: 1.04; R: 1.11*** This is a now 48 year old type II diabetic (last hemoglobin A1c 6.7%) who returns with a new wound on his right lower extremity, anterior tibial surface. He says it began exactly the way he is previous wound that led to wound care center admission started. He was mowing the lawn and developed a bump on his leg. It subsequently broke down resulting in a large ulcer. Patient was seen in the emergency department on 04 August 2023. He was given a prescription for Keflex and Bactrim. He was referred to the wound care center for further evaluation and management. 08/28/2023: The wound measured a little bit larger today and has a nonviable surface. There is still an odor coming from the wound. Edema control is good. The culture that I took last week grew out a polymicrobial population but all organisms were at fairly low levels. 09/02/2023: The wound size was stable today, but it is markedly cleaner with just a little bit of slough and eschar around the edges. The odor has abated. He has had quite a bit of drainage. Edema control is  excellent. 09/15/2023: The wound was a little bit smaller today and the surface is incredibly clean without any slough or eschar buildup. No odor or significant drainage. Edema control is excellent. 09/22/2023: The wound is smaller with healthy and robust granulation tissue filling in. Edema control remains excellent. 09/29/2023: The wound continues to contract. There is very minimal slough accumulation and the granulation tissue is healthy. Good edema control. 10/27/2023: The wound is smaller again today. The surface is extremely clean with healthy granulation tissue present. Good edema control. 11/03/2023: Continued contraction of the wound. The surface is a little bit hyper granulated and friable. No concern for infection. Edema control is excellent. 11/10/2023: The wound is smaller again today. There is a little bit of slough on the surface and the tissue remains slightly hyper granulated, but it is not particularly friable. Edema control remains excellent. 11/17/2023: The wound measured smaller again today. It is very clean with really no slough present. The tissue is hyper granulated, however. 11/24/2023: The intake nurse measured the wound larger today, but it frankly looks about the same size as it did last week. The hypertrophic granulation tissue has reaccumulated. Electronic Signature(s) Signed: 11/24/2023 10:00:09 AM By: Duanne Guess MD FACS Entered By: Duanne Guess on 11/24/2023 10:00:09 -------------------------------------------------------------------------------- Chemical Cauterization Details Patient Name: Date of Service: [name], [name] RD [name] 11/24/2023 9:15 A M Medical Record Number: [id number] Patient Account Number: [id number] Date of Birth/Sex: Treating RN: 1976/08/03 (47 y.o. [name] Primary Care Provider: Richarda Blade Other Clinician:  Referring Provider: Treating Provider/Extender: Jason Nest, Dinah Weeks in Treatment: 13 Procedure Performed  for: Wound #2 Right,Anterior Lower Leg Performed By: Physician Duanne Guess, MD The following information was scribed by: Karie Schwalbe The information was scribed for: Duanne Guess Post Procedure Diagnosis Same as Pre-procedure Electronic Signature(s) Signed: 11/24/2023 10:21:15 AM By: Duanne Guess MD FACS Signed: 11/24/2023 4:21:14 PM By: Karie Schwalbe RN Entered By: Karie Schwalbe on 11/24/2023 09:49:14 [name] ([id number]) [id number] Page 3 of 7 -------------------------------------------------------------------------------- Physical Exam Details Patient Name: Date of Service: [name] Cava RD [name] 11/24/2023 9:15 A M Medical Record Number: [id number] Patient Account Number: [id number] Date of Birth/Sex: Treating RN: July 17, 1976 (47 y.o. M) Primary Care Provider: Richarda Blade Other Clinician: Referring Provider: Treating Provider/Extender: Jason Nest, Dinah Weeks in Treatment: 13 Constitutional Slightly hypertensive. . . . no acute distress. Respiratory Normal work of breathing on room air.. Notes 11/24/2023: The intake nurse measured the wound larger today, but it frankly looks about the same size as it did last week. The hypertrophic granulation tissue has reaccumulated. Electronic Signature(s) Signed: 11/24/2023 10:03:31 AM By: Duanne Guess MD FACS Entered By: Duanne Guess on 11/24/2023 10:03:30 -------------------------------------------------------------------------------- Physician Orders Details Patient Name: Date of Service: [name], [name] RD [name] 11/24/2023 9:15 A M Medical Record Number: [id number] Patient Account Number: [id number] Date of Birth/Sex: Treating RN: [birth year] (47 y.o. [name] Primary Care Provider: Richarda Blade Other Clinician: Referring Provider: Treating Provider/Extender: Pleas Patricia in Treatment: 73 Verbal / Phone Orders: No Diagnosis  Coding ICD-10 Coding Code Description L97.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 with unspecified severity E11.622 Type 2 diabetes mellitus with other skin ulcer E66.01 Morbid (severe) obesity due to excess calories Follow-up Appointments ppointment in 1 week. - Dr. Lady Gary 12/01/23 at 10:45am Return A Anesthetic (In clinic) Topical Lidocaine 4% applied to wound bed Bathing/ Shower/ Hygiene May shower with protection but do not get wound dressing(s) wet. Protect dressing(s) with water repellant cover (for example, large plastic bag) or a cast cover and may then take shower. Edema Control - Orders / Instructions Elevate legs to the level of the heart or above for 30 minutes daily and/or when sitting for 3-4 times a day throughout the day. A void standing for long periods of time. Exercise regularly If compression wraps slide down please call wound center and speak with a nurse. Wound Treatment Wound #2 - Lower Leg Wound Laterality: Right, Anterior Cleanser: Soap and Water 1 x Per Week/30 Days GIN, RUBLEY [name] ([id number]) [phone].pdf Page 4 of 7 Discharge Instructions: May shower and wash wound with dial antibacterial soap and water prior to dressing change. Cleanser: Vashe 5.8 (oz) 1 x Per Week/30 Days Discharge Instructions: Cleanse the wound with Vashe prior to applying a clean dressing using gauze sponges, not tissue or cotton balls. Peri-Wound Care: Sween Lotion (Moisturizing lotion) 1 x Per Week/30 Days Discharge Instructions: Apply moisturizing lotion as directed Prim Dressing: Hydrofera Blue Ready Transfer Foam, 4x5 (in/in) 1 x Per Week/30 Days ary Discharge Instructions: Apply to wound bed as instructed Secondary Dressing: Woven Gauze Sponge, Non-Sterile 4x4 in 1 x Per Week/30 Days Discharge Instructions: Apply over primary dressing as directed. Compression Wrap: Urgo K2 Lite, (equivalent to a 3 layer) two layer compression  system, regular 1 x Per Week/30 Days Discharge Instructions: Apply Urgo K2 Lite as directed (alternative to 3 layer compression). Electronic Signature(s) Signed: 11/24/2023 10:21:15 AM By: Duanne Guess MD FACS Entered By: Duanne Guess  on 11/24/2023 10:03:45 -------------------------------------------------------------------------------- Problem List Details Patient Name: Date of Service: [name] Cava RD [name] 11/24/2023 9:15 A M Medical Record Number: [id number] Patient Account Number: [id number] Date of Birth/Sex: Treating RN: February 08, 1976 (47 y.o. M) Primary Care Provider: Richarda Blade Other Clinician: Referring Provider: Treating Provider/Extender: Jason Nest, Dinah Weeks in Treatment: 13 Active Problems ICD-10 Encounter Code Description Active Date MDM Diagnosis L97.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 with unspecified 08/20/2023 No Yes severity E11.622 Type 2 diabetes mellitus with other skin ulcer 08/20/2023 No Yes E66.01 Morbid (severe) obesity due to excess calories 08/20/2023 No Yes Inactive Problems Resolved Problems Electronic Signature(s) Signed: 11/24/2023 9:59:14 AM By: Duanne Guess MD FACS Entered By: Duanne Guess on 11/24/2023 09:59:14 [name] ([id number]) [id number] Page 5 of 7 -------------------------------------------------------------------------------- Progress Note Details Patient Name: Date of Service: [name] Cava RD [name] 11/24/2023 9:15 A M Medical Record Number: [id number] Patient Account Number: [id number] Date of Birth/Sex: Treating RN: 05-Jan-1976 (47 y.o. M) Primary Care Provider: Richarda Blade Other Clinician: Referring Provider: Treating Provider/Extender: Jason Nest, Dinah Weeks in Treatment: 13 Subjective Chief Complaint Information obtained from Patient 11/23/2018; patient is here for review of an ulcer on the left posterior calf 10/06/2019; patient  is here for review of the wound area from the previous visit which is on the left posterior lateral calf 08/20/2023: here for new wound on RLE anterior History of Present Illness (HPI) Admission 11/23/2018 This is a 48 year old man with type 2 diabetes. He tells [place] that about a week before Thanksgiving he noticed a small painful area on the back of his left mid calf. He saw a physician at urgent care on 10/10/2018 noted to have an open wound on the left calf. He was given Bactroban and Keflex as CandS was apparently negative. He saw his own primary doctor on 612/31/19 and was given Silvadene and doxycycline. He has been using Silvadene since then. He is saw his primary physician again on 1/10 was offered an Radio broadcast assistant but I do not think that he actually use this. The area in question is on the left posterior calf. There is some question that this was a insect bite and when he first presented but he certainly did not notice his any particular bite injury at that time. This did actually apparently at one point look as though it was progressing towards closure although it then it opened again. He has been using Silvadene for the past 3 weeks Past medical history; obstructive sleep apnea, Achilles tendinitis, hypertension, type 2 diabetes, cholecystectomy, fatty liver, ABI in this clinic was 1.38 on the left 11/30/2018; the area is essentially unchanged in size although the surface of the wound looks better. He has surrounding inflammation around the wound the exact etiology of this is unclear. 12/07/2018; the area has come down in diameter. Surface requires debridement. We have been using silver collagen. Is the wound seems to have come down in size I have elected not to biopsy this. Surrounding inflammation is also less 2/11; the area continues to come down in size and generally looks healthy. We have been using silver collagen under compression surrounding inflammation is less there is no  tenderness 2/18; the area continues to contract in size and looks healthy. We have been using silver collagen under compression 2/25; no major change in dimension this week. However the wound bed continues to look healthy and there appears to be less inflammation around the wound 3/3; the wound measures  slightly smaller. Healthy looking surface. Less inflammation 3/10; wound is closed. Healthy looking surface. Still some discoloration around the wound area but absolutely no tenderness. The cause of this wound was never really determined. However it progressed nicely towards healing READMISSION 10/06/2019 This is a patient I dealt with with an unusual wound on the left posterior lateral calf in the early part of this year. I was never really completely sure I understood what this was in terms of causation. However wde use silver collagen under compression and the area responded nicely and we are able to discharge him after roughly 6 weeks in the clinic. He did not clearly have arterial or venous issues. I had some thoughts about biopsying this area however acid continued to get better I did not go through with this. He states that everything is been fine up until the last few weeks. His wife noted a scab with perhaps some purulent drainage. They called primary care and was prescribed doxy which she is finished and everything seems to have settled down there was no open area however the patient wanted [place] to look at this. READMISSION 08/20/2023 ***ABIs L: 1.04; R: 1.11*** This is a now 48 year old type II diabetic (last hemoglobin A1c 6.7%) who returns with a new wound on his right lower extremity, anterior tibial surface. He says it began exactly the way he is previous wound that led to wound care center admission started. He was mowing the lawn and developed a bump on his leg. It subsequently broke down resulting in a large ulcer. Patient was seen in the emergency department on 04 August 2023. He  was given a prescription for Keflex and Bactrim. He was referred to the wound care center for further evaluation and management. 08/28/2023: The wound measured a little bit larger today and has a nonviable surface. There is still an odor coming from the wound. Edema control is good. The culture that I took last week grew out a polymicrobial population but all organisms were at fairly low levels. 09/02/2023: The wound size was stable today, but it is markedly cleaner with just a little bit of slough and eschar around the edges. The odor has abated. He has had quite a bit of drainage. Edema control is excellent. 09/15/2023: The wound was a little bit smaller today and the surface is incredibly clean without any slough or eschar buildup. No odor or significant drainage. Edema control is excellent. 09/22/2023: The wound is smaller with healthy and robust granulation tissue filling in. Edema control remains excellent. 09/29/2023: The wound continues to contract. There is very minimal slough accumulation and the granulation tissue is healthy. Good edema control. 10/27/2023: The wound is smaller again today. The surface is extremely clean with healthy granulation tissue present. Good edema control. 11/03/2023: Continued contraction of the wound. The surface is a little bit hyper granulated and friable. No concern for infection. Edema control is excellent. 11/10/2023: The wound is smaller again today. There is a little bit of slough on the surface and the tissue remains slightly hyper granulated, but it is not particularly friable. Edema control remains excellent. [name], [name] ([id number]) [id number] Page 6 of 7 11/17/2023: The wound measured smaller again today. It is very clean with really no slough present. The tissue is hyper granulated, however. 11/24/2023: The intake nurse measured the wound larger today, but it frankly looks about the same size as it did last week. The  hypertrophic granulation tissue has reaccumulated. Objective Constitutional Slightly hypertensive. no acute distress.  Vitals Time Taken: 9:35 AM, Height: 69 in, Weight: 330 lbs, BMI: 48.7, Temperature: 98.3 F, Pulse: 96 bpm, Respiratory Rate: 18 breaths/min, Blood Pressure: 145/88 mmHg. Respiratory Normal work of breathing on room air.. General Notes: 11/24/2023: The intake nurse measured the wound larger today, but it frankly looks about the same size as it did last week. The hypertrophic granulation tissue has reaccumulated. Integumentary (Hair, Skin) Wound #2 status is Open. Original cause of wound was Bump. The date acquired was: 07/18/2023. The wound has been in treatment 13 weeks. The wound is located on the Right,Anterior Lower Leg. The wound measures 2.5cm length x 1.5cm width x 0.1cm depth; 2.945cm^2 area and 0.295cm^3 volume. There is Fat Layer (Subcutaneous Tissue) exposed. There is no tunneling or undermining noted. There is a medium amount of serosanguineous drainage noted. The wound margin is flat and intact. There is large (67-100%) red granulation within the wound bed. There is a small (1-33%) amount of necrotic tissue within the wound bed including Adherent Slough. The periwound skin appearance had no abnormalities noted for texture. The periwound skin appearance had no abnormalities noted for moisture. The periwound skin appearance exhibited: Hemosiderin Staining. Periwound temperature was noted as No Abnormality. Assessment Active Problems ICD-10 Non-pressure chronic ulcer of other part of right lower leg with unspecified severity Type 2 diabetes mellitus with other skin ulcer Morbid (severe) obesity due to excess calories Procedures Wound #2 Pre-procedure diagnosis of Wound #2 is a Diabetic Wound/Ulcer of the Lower Extremity located on the Right,Anterior Lower Leg . There was a Three Layer Compression Therapy Procedure by Karie Schwalbe, RN. Post procedure Diagnosis  Wound #2: Same as Pre-Procedure Pre-procedure diagnosis of Wound #2 is a Diabetic Wound/Ulcer of the Lower Extremity located on the Right,Anterior Lower Leg . An Chemical Cauterization procedure was performed by Duanne Guess, MD. Post procedure Diagnosis Wound #2: Same as Pre-Procedure Plan Follow-up Appointments: Return Appointment in 1 week. - Dr. Lady Gary 12/01/23 at 10:45am Anesthetic: (In clinic) Topical Lidocaine 4% applied to wound bed Bathing/ Shower/ Hygiene: May shower with protection but do not get wound dressing(s) wet. Protect dressing(s) with water repellant cover (for example, large plastic bag) or a cast cover and may then take shower. Edema Control - Orders / Instructions: Elevate legs to the level of the heart or above for 30 minutes daily and/or when sitting for 3-4 times a day throughout the day. Avoid standing for long periods of time. Exercise regularly If compression wraps slide down please call wound center and speak with a nurse. WOUND #2: - Lower Leg Wound Laterality: Right, Anterior Cleanser: Soap and Water 1 x Per Week/30 Days [name], [name] ([id number]) [phone].pdf Page 7 of 7 Discharge Instructions: May shower and wash wound with dial antibacterial soap and water prior to dressing change. Cleanser: Vashe 5.8 (oz) 1 x Per Week/30 Days Discharge Instructions: Cleanse the wound with Vashe prior to applying a clean dressing using gauze sponges, not tissue or cotton balls. Peri-Wound Care: Sween Lotion (Moisturizing lotion) 1 x Per Week/30 Days Discharge Instructions: Apply moisturizing lotion as directed Prim Dressing: Hydrofera Blue Ready Transfer Foam, 4x5 (in/in) 1 x Per Week/30 Days ary Discharge Instructions: Apply to wound bed as instructed Secondary Dressing: Woven Gauze Sponge, Non-Sterile 4x4 in 1 x Per Week/30 Days Discharge Instructions: Apply over primary dressing as directed. Com pression Wrap: Urgo K2 Lite, (equivalent  to a 3 layer) two layer compression system, regular 1 x Per Week/30 Days Discharge Instructions: Apply Urgo K2 Lite as directed (alternative  to 3 layer compression). 11/24/2023: The intake nurse measured the wound larger today, but it frankly looks about the same size as it did last week. The hypertrophic granulation tissue has reaccumulated. No debridement was necessary today. I chemically cauterized the hypertrophic granulation tissue with silver nitrate. We will continue Hydrofera Blue with Urgo light compression. Follow-up in 1 week. Electronic Signature(s) Signed: 11/24/2023 10:04:14 AM By: Duanne Guess MD FACS Entered By: Duanne Guess on 11/24/2023 10:04:14 -------------------------------------------------------------------------------- SuperBill Details Patient Name: Date of Service: [name], [name] RD [name] 11/24/2023 Medical Record Number: [id number] Patient Account Number: [id number] Date of Birth/Sex: Treating RN: 07-18-1976 (47 y.o. M) Primary Care Provider: Richarda Blade Other Clinician: Referring Provider: Treating Provider/Extender: Jason Nest, Dinah Weeks in Treatment: 13 Diagnosis Coding ICD-10 Codes Code Description L97.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 with unspecified severity E11.622 Type 2 diabetes mellitus with other skin ulcer E66.01 Morbid (severe) obesity due to excess calories Facility Procedures : CPT4 Code: [id number] Description: 17250 - CHEM CAUT GRANULATION TISS ICD-10 Diagnosis Description L97.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 with unspecified sev E11.622 Type 2 diabetes mellitus with other skin ulcer E66.01 Morbid (severe) obesity  due to excess calories Modifier: erity Quantity: 1 Physician Procedures : CPT4 Code Description Modifier [id number] - WC PHYS CHEM CAUT GRAN TISSUE ICD-10 Diagnosis Description L97.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 with  unspecified severity E11.622 Type 2 diabetes mellitus with other skin  ulcer E66.01 Morbid (severe) obesity due to excess calories Quantity: 1 Electronic Signature(s) Signed: 11/24/2023 10:04:40 AM By: Duanne Guess MD FACS Entered By: Duanne Guess on 11/24/2023 10:04:40

## 2023-12-01 ENCOUNTER — Ambulatory Visit (INDEPENDENT_AMBULATORY_CARE_PROVIDER_SITE_OTHER): Payer: BC Managed Care – PPO | Admitting: Psychology

## 2023-12-01 ENCOUNTER — Encounter (HOSPITAL_BASED_OUTPATIENT_CLINIC_OR_DEPARTMENT_OTHER): Payer: BC Managed Care – PPO | Admitting: General Surgery

## 2023-12-01 DIAGNOSIS — F4323 Adjustment disorder with mixed anxiety and depressed mood: Secondary | ICD-10-CM

## 2023-12-01 DIAGNOSIS — L97812 Non-pressure chronic ulcer of other part of right lower leg with fat layer exposed: Secondary | ICD-10-CM | POA: Diagnosis not present

## 2023-12-01 DIAGNOSIS — Z6841 Body Mass Index (BMI) 40.0 and over, adult: Secondary | ICD-10-CM | POA: Diagnosis not present

## 2023-12-01 DIAGNOSIS — E11622 Type 2 diabetes mellitus with other skin ulcer: Secondary | ICD-10-CM | POA: Diagnosis not present

## 2023-12-01 NOTE — Progress Notes (Signed)
[facility] Counselor/Therapist Progress Note  Patient ID: [name], MRN: [id number],    Date: 12/01/2023  Time Spent: 50 mins; start time: 0900; end time: 0950  Treatment Type: Individual Therapy  Reported Symptoms: Pt presents for session via Caregility video.  Pt grants consent for session, stating he is in his home with no one else present; pt also shares that he understands the limitations of virtual session.  I shared with pt that I am in my office with no one else here either.  Mental Status Exam: Appearance:  Casual     Behavior: Appropriate  Motor: Normal  Speech/Language:  Clear and Coherent  Affect: Appropriate  Mood: normal  Thought process: normal  Thought content:   WNL  Sensory/Perceptual disturbances:   WNL  Orientation: oriented to person, place, and time/date  Attention: Good  Concentration: Good  Memory: WNL  Fund of knowledge:  Good  Insight:   Good  Judgment:  Good  Impulse Control: Good   Risk Assessment: Danger to Self:  No Self-injurious Behavior: No Danger to Others: No Duty to Warn:no Physical Aggression / Violence:No  Access to Firearms a concern: No  Gang Involvement:No   Subjective: Pt shares that, "My mom is recovering from her surgery; my dad is not the best caregiver in the world but he is trying.  Mom is making progress in her recovery."  Pt shares that his wife is fully recovered from her procedure back in September and she is making food for his parents and sending it to them.  Pt shares that he got an award for his performance in the final portion of last year; there will be a ceremony in Feb for the awards to be handed out.  Pt shares that the older grandchildren have been told about pt's mom's surgery and about her recovery and everyone is supportive with food and attention.  Pt's mom also had reconstructive surgery on her breasts and that is what she is recovering from now.  [facility] (next appt is today after our session) and he hopes that he can be fully healed by sometime in Feb.  Pt shares that he is likely to need an additional surgery on his same ankle once this wound heals up.  He had an issue with that ankle when he was a baby and it is now necessary for a corrective surgery.  Pt continues to work from home; he still has to go into the office one day per month.  Pt shares that his oldest son is still working at [employer] on third shift and is doing fine with it so far.  Pt shares that he is "dealing with some things with my biological son recently."  Pt shares that is son is graduating from [employer] this Spring and he wants to go to [employer]; the two youngest kids got caught buying pot at school by his ex-wife and they are dealing with that; they have blamed it on his biological son.  Pt and his ex-wife have different parenting styles for the kids and that is confusing for the kids; his current wife is also involved in the disagreement now and that is tough on pt.  Pt continues to have Tues, Wed, and Sat off from work each week.  Encouraged pt to be intentional about engaging in his self care activities and we will meet in 2 weeks for a follow up session.  Interventions: Cognitive Behavioral Therapy  Diagnosis:Adjustment disorder with mixed anxiety and depressed mood  Plan: Treatment Plan Strengths/Abilities:  Intelligent, Intuitive, Willing to participate in therapy Treatment Preferences:  Outpatient Individual Therapy Statement of Needs:  Patient is to use CBT, mindfulness and coping skills to help manage and/or decrease symptoms associated with their diagnosis. Symptoms:  Depressed/Irritable mood, worry, social withdrawal Problems Addressed:  Depressive thoughts, Sadness, Sleep issues, etc. Long Term Goals:  Pt to reduce overall level, frequency, and intensity of the feelings of depression/anxiety as evidenced by decreased irritability, negative self talk, and helpless  feelings from 6 to 7 days/week to 0 to 1 days/week, per client report, for at least 3 consecutive months.  Progress: 30% Short Term Goals:  Pt to verbally express understanding of the relationship between feelings of depression/anxiety and their impact on thinking patterns and behaviors.  Pt to verbalize an understanding of the role that distorted thinking plays in creating fears, excessive worry, and ruminations.  Progress: 30% Target Date:  04/24/2024 Frequency:  Bi-weekly Modality:  Cognitive Behavioral Therapy Interventions by Therapist:  Therapist will use CBT, Mindfulness exercises, Coping skills and Referrals, as needed by client. Client has verbally approved this treatment plan.  Karie Kirks, [facility]

## 2023-12-09 ENCOUNTER — Encounter (HOSPITAL_BASED_OUTPATIENT_CLINIC_OR_DEPARTMENT_OTHER): Payer: BC Managed Care – PPO | Attending: General Surgery | Admitting: General Surgery

## 2023-12-09 DIAGNOSIS — L97812 Non-pressure chronic ulcer of other part of right lower leg with fat layer exposed: Secondary | ICD-10-CM | POA: Diagnosis not present

## 2023-12-09 DIAGNOSIS — E11622 Type 2 diabetes mellitus with other skin ulcer: Secondary | ICD-10-CM | POA: Insufficient documentation

## 2023-12-09 DIAGNOSIS — L97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 with unspecified severity: Secondary | ICD-10-CM | POA: Insufficient documentation

## 2023-12-15 ENCOUNTER — Encounter (HOSPITAL_BASED_OUTPATIENT_CLINIC_OR_DEPARTMENT_OTHER): Payer: BC Managed Care – PPO | Admitting: General Surgery

## 2023-12-15 DIAGNOSIS — L97812 Non-pressure chronic ulcer of other part of right lower leg with fat layer exposed: Secondary | ICD-10-CM | POA: Diagnosis not present

## 2023-12-15 DIAGNOSIS — L97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 with unspecified severity: Secondary | ICD-10-CM | POA: Diagnosis not present

## 2023-12-15 DIAGNOSIS — E11622 Type 2 diabetes mellitus with other skin ulcer: Secondary | ICD-10-CM | POA: Diagnosis not present

## 2023-12-16 ENCOUNTER — Ambulatory Visit: Payer: BC Managed Care – PPO | Admitting: Psychology

## 2023-12-22 ENCOUNTER — Ambulatory Visit: Payer: BC Managed Care – PPO | Admitting: Psychology

## 2023-12-22 DIAGNOSIS — F4323 Adjustment disorder with mixed anxiety and depressed mood: Secondary | ICD-10-CM | POA: Diagnosis not present

## 2023-12-22 NOTE — Progress Notes (Signed)
 [facility] Counselor/Therapist Progress Note  Patient ID: [name], MRN: [id number],    Date: 12/22/2023  Time Spent: 45 mins; start time: 1500; end time: 1545  Treatment Type: Individual Therapy  Reported Symptoms: Pt presents for session via Caregility video.  Pt grants consent for session, stating he is in his home with no one else present; pt also shares that he understands the limitations of virtual session.  I shared with pt that I am in my office with no one else here either.  Mental Status Exam: Appearance:  Casual     Behavior: Appropriate  Motor: Normal  Speech/Language:  Clear and Coherent  Affect: Appropriate  Mood: normal  Thought process: normal  Thought content:   WNL  Sensory/Perceptual disturbances:   WNL  Orientation: oriented to person, place, and time/date  Attention: Good  Concentration: Good  Memory: WNL  Fund of knowledge:  Good  Insight:   Good  Judgment:  Good  Impulse Control: Good   Risk Assessment: Danger to Self:  No Self-injurious Behavior: No Danger to Others: No Duty to Warn:no Physical Aggression / Violence:No  Access to Firearms a concern: No  Gang Involvement:No   Subjective: Pt shares that, "My mom is continuing to recover from her surgery; I have been taking her to a lot of her follow up appointments and going to my own appts too."  Pt shares that he and his wife have been spending time with each other (including this past week for Valentine's day) and that has been good for them.  [facility]; his last appt they told him he was about 93% healed and they hope it will be at 100% by the end of March.  Pt shares that his work continues to be busy but it is going fine.  His youngest sons are doing well in school so far; his oldest son is still working 3rd shift at [employer] and he seems to be enjoying it.  Pt continues to work from home; he still has to go into the office one day per  month.  Pt continues to have Tues, Wed, and Sat off from work each week.  Encouraged pt to be intentional about engaging in his self care activities and we will meet in 2 weeks for a follow up session.  Interventions: Cognitive Behavioral Therapy  Diagnosis:Adjustment disorder with mixed anxiety and depressed mood  Plan: Treatment Plan Strengths/Abilities:  Intelligent, Intuitive, Willing to participate in therapy Treatment Preferences:  Outpatient Individual Therapy Statement of Needs:  Patient is to use CBT, mindfulness and coping skills to help manage and/or decrease symptoms associated with their diagnosis. Symptoms:  Depressed/Irritable mood, worry, social withdrawal Problems Addressed:  Depressive thoughts, Sadness, Sleep issues, etc. Long Term Goals:  Pt to reduce overall level, frequency, and intensity of the feelings of depression/anxiety as evidenced by decreased irritability, negative self talk, and helpless feelings from 6 to 7 days/week to 0 to 1 days/week, per client report, for at least 3 consecutive months.  Progress: 30% Short Term Goals:  Pt to verbally express understanding of the relationship between feelings of depression/anxiety and their impact on thinking patterns and behaviors.  Pt to verbalize an understanding of the role that distorted thinking plays in creating fears, excessive worry, and ruminations.  Progress: 30% Target Date:  04/24/2024 Frequency:  Bi-weekly Modality:  Cognitive Behavioral Therapy Interventions by Therapist:  Therapist will use CBT, Mindfulness exercises, Coping skills and Referrals, as  needed by client. Client has verbally approved this treatment plan.  Karie Kirks, [facility]

## 2023-12-23 ENCOUNTER — Encounter (HOSPITAL_BASED_OUTPATIENT_CLINIC_OR_DEPARTMENT_OTHER): Payer: BC Managed Care – PPO | Admitting: General Surgery

## 2023-12-23 DIAGNOSIS — L97812 Non-pressure chronic ulcer of other part of right lower leg with fat layer exposed: Secondary | ICD-10-CM | POA: Diagnosis not present

## 2023-12-23 DIAGNOSIS — L97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 with unspecified severity: Secondary | ICD-10-CM | POA: Diagnosis not present

## 2023-12-23 DIAGNOSIS — E11622 Type 2 diabetes mellitus with other skin ulcer: Secondary | ICD-10-CM | POA: Diagnosis not present

## 2023-12-25 ENCOUNTER — Ambulatory Visit: Payer: BC Managed Care – PPO | Admitting: Family

## 2023-12-29 ENCOUNTER — Ambulatory Visit: Payer: BC Managed Care – PPO | Admitting: Family

## 2023-12-29 ENCOUNTER — Encounter (HOSPITAL_BASED_OUTPATIENT_CLINIC_OR_DEPARTMENT_OTHER): Payer: BC Managed Care – PPO | Admitting: General Surgery

## 2023-12-29 DIAGNOSIS — E11622 Type 2 diabetes mellitus with other skin ulcer: Secondary | ICD-10-CM | POA: Diagnosis not present

## 2023-12-29 DIAGNOSIS — L97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 with unspecified severity: Secondary | ICD-10-CM | POA: Diagnosis not present

## 2023-12-29 DIAGNOSIS — L97812 Non-pressure chronic ulcer of other part of right lower leg with fat layer exposed: Secondary | ICD-10-CM | POA: Diagnosis not present

## 2023-12-30 ENCOUNTER — Ambulatory Visit (INDEPENDENT_AMBULATORY_CARE_PROVIDER_SITE_OTHER): Payer: BC Managed Care – PPO | Admitting: Family

## 2023-12-30 ENCOUNTER — Encounter: Payer: Self-pay | Admitting: Family

## 2023-12-30 VITALS — BP 136/80 | HR 97 | Temp 98.0°F | Resp 20 | Ht 69.0 in | Wt 320.0 lb

## 2023-12-30 DIAGNOSIS — E785 Hyperlipidemia, unspecified: Secondary | ICD-10-CM

## 2023-12-30 DIAGNOSIS — G4733 Obstructive sleep apnea (adult) (pediatric): Secondary | ICD-10-CM | POA: Diagnosis not present

## 2023-12-30 DIAGNOSIS — F339 Major depressive disorder, recurrent, unspecified: Secondary | ICD-10-CM

## 2023-12-30 DIAGNOSIS — Z2821 Immunization not carried out because of patient refusal: Secondary | ICD-10-CM

## 2023-12-30 DIAGNOSIS — G43009 Migraine without aura, not intractable, without status migrainosus: Secondary | ICD-10-CM

## 2023-12-30 DIAGNOSIS — E1169 Type 2 diabetes mellitus with other specified complication: Secondary | ICD-10-CM

## 2023-12-30 DIAGNOSIS — I10 Essential (primary) hypertension: Secondary | ICD-10-CM

## 2023-12-30 DIAGNOSIS — F419 Anxiety disorder, unspecified: Secondary | ICD-10-CM

## 2023-12-30 DIAGNOSIS — S81801D Unspecified open wound, right lower leg, subsequent encounter: Secondary | ICD-10-CM

## 2023-12-30 DIAGNOSIS — E669 Obesity, unspecified: Secondary | ICD-10-CM

## 2023-12-30 MED ORDER — OZEMPIC (0.25 OR 0.5 MG/DOSE) 2 MG/3ML ~~LOC~~ SOPN: 1.0000 mg | PEN_INJECTOR | SUBCUTANEOUS | 1 refills | Status: DC

## 2023-12-31 LAB — CBC WITH DIFFERENTIAL/PLATELET
Absolute Lymphocytes: 1110 {cells}/uL (ref 850–3900)
Absolute Monocytes: 289 {cells}/uL (ref 200–950)
Basophils Absolute: 61 {cells}/uL (ref 0–200)
Basophils Relative: 1.6 %
Eosinophils Absolute: 80 {cells}/uL (ref 15–500)
Eosinophils Relative: 2.1 %
HCT: 42.2 % (ref 38.5–50.0)
Hemoglobin: 14 g/dL (ref 13.2–17.1)
MCH: 28.9 pg (ref 27.0–33.0)
MCHC: 33.2 g/dL (ref 32.0–36.0)
MCV: 87.2 fL (ref 80.0–100.0)
MPV: 10.3 fL (ref 7.5–12.5)
Monocytes Relative: 7.6 %
Neutro Abs: 2261 {cells}/uL (ref 1500–7800)
Neutrophils Relative %: 59.5 %
Platelets: 116 10*3/uL — ABNORMAL LOW (ref 140–400)
RBC: 4.84 10*6/uL (ref 4.20–5.80)
RDW: 13.9 % (ref 11.0–15.0)
Total Lymphocyte: 29.2 %
WBC: 3.8 10*3/uL (ref 3.8–10.8)

## 2023-12-31 LAB — LIPID PANEL
Cholesterol: 125 mg/dL (ref <200)
HDL: 38 mg/dL — ABNORMAL LOW (ref >=40)
LDL Cholesterol (Calc): 69 mg/dL
Non-HDL Cholesterol (Calc): 87 mg/dL (ref <130)
Total CHOL/HDL Ratio: 3.3 (calc) (ref <5.0)
Triglycerides: 101 mg/dL (ref <150)

## 2023-12-31 LAB — COMPLETE METABOLIC PANEL WITH GFR
AG Ratio: 2 (calc) (ref 1.0–2.5)
ALT: 16 U/L (ref 9–46)
AST: 11 U/L (ref 10–40)
Albumin: 4.5 g/dL (ref 3.6–5.1)
Alkaline phosphatase (APISO): 52 U/L (ref 36–130)
BUN/Creatinine Ratio: 14 (calc) (ref 6–22)
BUN: 18 mg/dL (ref 7–25)
CO2: 25 mmol/L (ref 20–32)
Calcium: 9.2 mg/dL (ref 8.6–10.3)
Chloride: 105 mmol/L (ref 98–110)
Creat: 1.31 mg/dL — ABNORMAL HIGH (ref 0.60–1.29)
Globulin: 2.3 g/dL (ref 1.9–3.7)
Glucose, Bld: 97 mg/dL (ref 65–99)
Potassium: 4 mmol/L (ref 3.5–5.3)
Sodium: 139 mmol/L (ref 135–146)
Total Bilirubin: 1 mg/dL (ref 0.2–1.2)
Total Protein: 6.8 g/dL (ref 6.1–8.1)
eGFR: 68 mL/min/{1.73_m2} (ref >=60)

## 2023-12-31 LAB — HEMOGLOBIN A1C
Hgb A1c MFr Bld: 5.6 %{Hb} (ref <5.7)
Mean Plasma Glucose: 114 mg/dL
eAG (mmol/L): 6.3 mmol/L

## 2023-12-31 LAB — TSH: TSH: 1.26 m[IU]/L (ref 0.40–4.50)

## 2024-01-03 NOTE — Progress Notes (Signed)
 Provider: Richarda Blade FNP-C   [name], [name], NP  Patient Care Team: [name], [name], NP as PCP - General (Family Medicine) [name], [name], PA-C (Family Medicine)  Extended Emergency Contact Information Primary Emergency Contact: [name] of [place] Home Phone: [phone] Relation: Spouse  Code Status:  Full Code  Goals of care: Advanced Directive information    12/30/2023   10:31 AM  Advanced Directives  Does Patient Have a Medical Advance Directive? No  Would patient like information on creating a medical advance directive? No - Patient declined     Chief Complaint  Patient presents with   Medical Management of Chronic Issues    4 month follow up and discuss colonoscopy,foot exam,eye exam,and pneumococcal ,flu and covid vaccines.   Discussed the use of AI scribe software for clinical note transcription with the patient, who gave verbal consent to proceed.  History of Present Illness   The patient presents for a 4 month follow-up visit regarding blood pressure management and diabetes control.   Home blood pressure readings are generally well-controlled, with a drop after medication intake and an increase by evening. Current medications include valsartan, metoprolol 25 mg, spironolactone 50 mg, and clonidine as needed. No dizziness, headaches, or chest pain. Previous readings were 150/100, later dropping to 136/80.  Diabetes management is ongoing. Blood sugars have not been checked in about two weeks. Weight is stable at 320 pounds. He is on Ozempic 0.5 mg without side effects, except for feeling unusually cold. He has made dietary changes, eating more salads, smaller portions, and less at night, though he struggles with breakfast. Last meal was a chicken Caesar salad at 7:00 PM.  He has a wound on his leg, wrapped to manage swelling, with weekly follow-ups with wound care clinic. It is 92% closed, improving, and related to an allergic  reaction rather than diabetes or circulation issues.  He uses a CPAP machine for sleep apnea and reports feeling rested, sleeping four to six hours per night. No issues with depression, anxiety, or migraines. He is not taking potassium supplements anymore.  He occasionally uses Cialis and is on Vyvanse 70 mg. He has not seen a dentist or eye doctor this year but did so last year. No history of smoking.    Past Medical History:  Diagnosis Date   ADHD (attention deficit hyperactivity disorder)    Anxiety    Depression    Hypertension    Migraine    Nasal congestion 10/03/2014   Non-insulin dependent type 2 diabetes mellitus (HCC)    Obesity    Sleep apnea    Wears contact lenses    Past Surgical History:  Procedure Laterality Date   CHOLECYSTECTOMY N/A 10/09/2014   Procedure: LAPAROSCOPIC CHOLECYSTECTOMY;  Surgeon: [name], MD;  Location: [facility];  Service: General;  Laterality: N/A;    No Known Allergies  Allergies as of 12/30/2023   No Known Allergies      Medication List        Accurate as of December 30, 2023 11:59 PM. If you have any questions, ask your nurse or doctor.          STOP taking these medications    potassium chloride SA 20 MEQ tablet Commonly known as: KLOR-CON M Stopped by: [name] [name]       TAKE these medications    atorvastatin 10 MG tablet Commonly known as: LIPITOR Take 1 tablet (10 mg total) by mouth daily.  Blood Glucose Monitoring Suppl Devi 1 each by Does not apply route in the morning, at noon, and at bedtime. May substitute to any manufacturer covered by patient's insurance.   Blood Pressure Kit 1 each by Does not apply route daily.   metoprolol tartrate 25 MG tablet Commonly known as: LOPRESSOR Take 1 tablet (25 mg total) by mouth 2 (two) times daily.   Ozempic (0.25 or 0.5 MG/DOSE) 2 MG/3ML Sopn Generic drug: Semaglutide(0.25 or 0.5MG /DOS) Inject 1 mg into the skin once a week. What  changed: See the new instructions. Changed by: [name] [name]   spironolactone 50 MG tablet Commonly known as: ALDACTONE Take 50 mg by mouth daily.   tadalafil 5 MG tablet Commonly known as: CIALIS TAKE 1 TABLET BY MOUTH DAILY AS NEEDED FOR ERECTILE DYSFUNCTION.   valsartan 160 MG tablet Commonly known as: DIOVAN TAKE 1 TABLET BY MOUTH EVERY DAY   Vyvanse 20 MG capsule Generic drug: lisdexamfetamine Take 20 mg by mouth daily.   Vyvanse 50 MG capsule Generic drug: lisdexamfetamine Take 50 mg by mouth daily.        Review of Systems  Constitutional:  Negative for appetite change, chills, fatigue, fever and unexpected weight change.  HENT:  Negative for congestion, dental problem, ear discharge, ear pain, facial swelling, hearing loss, nosebleeds, postnasal drip, rhinorrhea, sinus pressure, sinus pain, sneezing, sore throat, tinnitus and trouble swallowing.   Eyes:  Negative for pain, discharge, redness, itching and visual disturbance.  Respiratory:  Negative for cough, chest tightness, shortness of breath and wheezing.   Cardiovascular:  Negative for chest pain, palpitations and leg swelling.  Gastrointestinal:  Negative for abdominal distention, abdominal pain, blood in stool, constipation, diarrhea, nausea and vomiting.  Endocrine: Negative for cold intolerance, heat intolerance, polydipsia, polyphagia and polyuria.  Genitourinary:  Negative for difficulty urinating, dysuria, flank pain, frequency and urgency.  Musculoskeletal:  Negative for arthralgias, back pain, gait problem, joint swelling, myalgias, neck pain and neck stiffness.  Skin:  Positive for wound. Negative for color change, pallor and rash.       Right leg wound follows up with wound care center   Neurological:  Negative for dizziness, syncope, speech difficulty, weakness, light-headedness, numbness and headaches.  Hematological:  Does not bruise/bleed easily.  Psychiatric/Behavioral:  Negative for agitation,  behavioral problems, confusion, hallucinations, self-injury, sleep disturbance and suicidal ideas. The patient is not nervous/anxious.     Immunization History  Administered Date(s) Administered   Influenza,inj,Quad PF,6+ Mos 07/05/2015, 08/19/2016, 07/29/2019, 10/17/2021   PFIZER(Purple Top)SARS-COV-2 Vaccination 03/29/2020, 04/19/2020, 08/19/2021   Pneumococcal Polysaccharide-23 05/25/2017   Tdap 05/25/2017   Pertinent  Health Maintenance Due  Topic Date Due   OPHTHALMOLOGY EXAM  09/18/2019   FOOT EXAM  10/17/2022   INFLUENZA VACCINE  06/04/2023   Colonoscopy  Never done   HEMOGLOBIN A1C  06/28/2024      10/07/2019   10:27 AM 10/17/2021    1:58 PM 08/06/2023    1:04 PM 08/21/2023   10:49 AM 12/30/2023   10:31 AM  Fall Risk  Falls in the past year? 0 0 0 0 0  Was there an injury with Fall?  0   0  Fall Risk Category Calculator  0   0  Fall Risk Category (Retired)  Low     (RETIRED) Patient Fall Risk Level  Low fall risk     Patient at Risk for Falls Due to  No Fall Risks     Fall risk Follow up  Falls  evaluation completed      Functional Status Survey:    Vitals:   12/30/23 1036 12/30/23 1043  BP: (!) 150/100 136/80  Pulse: 97   Resp: 20   Temp: 98 F (36.7 C)   SpO2: 99%   Weight: (!) 320 lb (145.2 kg)   Height: 5\' 9"  (1.753 m)    Body mass index is 47.26 kg/m. Physical Exam Vitals reviewed.  Constitutional:      General: He is not in acute distress.    Appearance: Normal appearance. He is morbidly obese. He is not ill-appearing or diaphoretic.  HENT:     Head: Normocephalic.     Right Ear: Tympanic membrane, ear canal and external ear normal. There is no impacted cerumen.     Left Ear: Tympanic membrane, ear canal and external ear normal. There is no impacted cerumen.     Nose: Nose normal. No congestion or rhinorrhea.     Mouth/Throat:     Mouth: Mucous membranes are moist.     Pharynx: Oropharynx is clear. No oropharyngeal exudate or posterior  oropharyngeal erythema.  Eyes:     General: No scleral icterus.       Right eye: No discharge.        Left eye: No discharge.     Extraocular Movements: Extraocular movements intact.     Conjunctiva/sclera: Conjunctivae normal.     Pupils: Pupils are equal, round, and reactive to light.  Neck:     Vascular: No carotid bruit.  Cardiovascular:     Rate and Rhythm: Normal rate and regular rhythm.     Pulses: Normal pulses.     Heart sounds: Normal heart sounds. No murmur heard.    No friction rub. No gallop.  Pulmonary:     Effort: Pulmonary effort is normal. No respiratory distress.     Breath sounds: Normal breath sounds. No wheezing, rhonchi or rales.  Chest:     Chest wall: No tenderness.  Abdominal:     General: Bowel sounds are normal. There is no distension.     Palpations: Abdomen is soft. There is no mass.     Tenderness: There is no abdominal tenderness. There is no right CVA tenderness, left CVA tenderness, guarding or rebound.  Musculoskeletal:        General: No swelling or tenderness. Normal range of motion.     Cervical back: Normal range of motion. No rigidity or tenderness.     Right lower leg: No edema.     Left lower leg: No edema.  Lymphadenopathy:     Cervical: No cervical adenopathy.  Skin:    General: Skin is warm and dry.     Coloration: Skin is not pale.     Findings: No bruising, erythema, lesion or rash.     Comments: Right leg/foot dressing dry,clean and intact.wound not visualized dressing managed by wound care center   Neurological:     Mental Status: He is alert and oriented to person, place, and time.     Cranial Nerves: No cranial nerve deficit.     Sensory: No sensory deficit.     Motor: No weakness.     Coordination: Coordination normal.     Gait: Gait normal.  Psychiatric:        Mood and Affect: Mood normal.        Speech: Speech normal.        Behavior: Behavior normal.        Thought Content: Thought content normal.  Judgment:  Judgment normal.      Labs reviewed: Recent Labs    08/04/23 0831 08/06/23 1426 09/25/23 1115 11/17/23 1057 12/30/23 1135  NA 136   < > 139 138 139  K 2.7*   < > 3.5 4.0 4.0  CL 100   < > 103 103 105  CO2 26   < > 28 30 25   GLUCOSE 150*   < > 105* 105* 97  BUN 25*   < > 21 24* 18  CREATININE 1.38*   < > 1.37* 1.52* 1.31*  CALCIUM 8.3*   < > 9.3 9.5 9.2  MG 1.6*  --   --   --   --    < > = values in this interval not displayed.   Recent Labs    09/25/23 1115 11/17/23 1057 12/30/23 1135  AST 11 11* 11  ALT 15 14 16   ALKPHOS  --  55  --   BILITOT 1.4* 1.0 1.0  PROT 6.9 7.1 6.8  ALBUMIN  --  4.3  --    Recent Labs    09/25/23 1115 11/17/23 1057 12/30/23 1135  WBC 3.6* 4.1 3.8  NEUTROABS 2,056 2.5 2,261  HGB 13.9 13.1 14.0  HCT 41.0 38.0* 42.2  MCV 86.7 83.9 87.2  PLT 117* 189 116*   Lab Results  Component Value Date   TSH 1.26 12/30/2023   Lab Results  Component Value Date   HGBA1C 5.6 12/30/2023   Lab Results  Component Value Date   CHOL 125 12/30/2023   HDL 38 (L) 12/30/2023   LDLCALC 69 12/30/2023   TRIG 101 12/30/2023   CHOLHDL 3.3 12/30/2023    Significant Diagnostic Results in last 30 days:  No results found.  Assessment/Plan  Hypertension Blood pressure initially recorded at 150/100 mmHg, decreased to 136/80 mmHg upon recheck. Home readings show initial control with medication, but elevation by evening. No symptoms of dizziness, headache, or chest pain. Current medications include valsartan, metoprolol, spironolactone, and clonidine as needed. - Continue valsartan, metoprolol, spironolactone, and clonidine as needed - Monitor blood pressure at home regularly - Ensure adequate sleep to help control blood pressure  Type 2 Diabetes Mellitus Managed with Ozempic 0.5 mg. Blood sugar levels not checked in two weeks. No side effects from Ozempic, but reports feeling cold, possibly indicating anemia or thyroid issue. A1c will be checked to  assess diabetes control. - Increase Ozempic to 1 mg after current supply - Check blood sugar daily and maintain a log - Order lab work for diabetes control, anemia, thyroid, and cholesterol - Encourage dietary changes to reduce evening carbohydrate intake  Obesity Weight at 320 lbs, unchanged from last visit. Dietary changes include more salads and smaller portions, but struggles with morning eating. Weight loss anticipated with increased Ozempic dose and dietary changes. - Continue dietary changes to reduce portion sizes and increase vegetable intake - Increase Ozempic to 1 mg to aid in weight loss  Hyperlipidemia Managed with atorvastatin. Cholesterol levels will be rechecked as part of lab work. - Continue atorvastatin - Order lab work to check cholesterol levels  Wound on leg Wound on top of leg, 92% closed. Allergic reaction to bug bite suspected. No swelling or circulation issues. Weekly follow-up with wound clinic. - Continue weekly follow-up with wound clinic - Monitor for signs of infection or worsening condition  Generalized Anxiety disorder/Depression  - stable  - Relaxation technique advised  - continue to monitor   OSA  - continue on  C-Pap - continue with weight management   Follow-up Follow-up planned to assess response to increased Ozempic dose and review lab results. - Schedule follow-up appointment in four months - Advise to return sooner if blood sugar levels consistently rise or other concerns arise   Family/ staff Communication: Reviewed plan of care with patient verbalized understanding   Labs/tests ordered:  - CBC with Differential/Platelet - CMP with eGFR([facility]) - TSH - Hgb A1C - Lipid panel  Next Appointment : Return in about 4 months (around 04/28/2024) for medical mangement of chronic issues.[name]   Spent 30 minutes of Face to face and non-face to face with patient  >50% time spent counseling; reviewing medical record; tests; labs; documentation and  developing future plan of care.   Caesar Bookman, NP

## 2024-01-05 ENCOUNTER — Encounter (HOSPITAL_BASED_OUTPATIENT_CLINIC_OR_DEPARTMENT_OTHER): Payer: BC Managed Care – PPO | Attending: General Surgery | Admitting: General Surgery

## 2024-01-05 DIAGNOSIS — L97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 with unspecified severity: Secondary | ICD-10-CM | POA: Diagnosis not present

## 2024-01-05 DIAGNOSIS — Z9049 Acquired absence of other specified parts of digestive tract: Secondary | ICD-10-CM | POA: Insufficient documentation

## 2024-01-05 DIAGNOSIS — K760 Fatty (change of) liver, not elsewhere classified: Secondary | ICD-10-CM | POA: Diagnosis not present

## 2024-01-05 DIAGNOSIS — G4733 Obstructive sleep apnea (adult) (pediatric): Secondary | ICD-10-CM | POA: Insufficient documentation

## 2024-01-05 DIAGNOSIS — Z6841 Body Mass Index (BMI) 40.0 and over, adult: Secondary | ICD-10-CM | POA: Insufficient documentation

## 2024-01-05 DIAGNOSIS — L97812 Non-pressure chronic ulcer of other part of right lower leg with fat layer exposed: Secondary | ICD-10-CM | POA: Diagnosis not present

## 2024-01-05 DIAGNOSIS — E11622 Type 2 diabetes mellitus with other skin ulcer: Secondary | ICD-10-CM | POA: Diagnosis not present

## 2024-01-06 ENCOUNTER — Ambulatory Visit: Payer: BC Managed Care – PPO | Admitting: Psychology

## 2024-01-12 ENCOUNTER — Encounter (HOSPITAL_BASED_OUTPATIENT_CLINIC_OR_DEPARTMENT_OTHER): Payer: BC Managed Care – PPO | Admitting: General Surgery

## 2024-01-12 DIAGNOSIS — Z6841 Body Mass Index (BMI) 40.0 and over, adult: Secondary | ICD-10-CM | POA: Diagnosis not present

## 2024-01-12 DIAGNOSIS — G4733 Obstructive sleep apnea (adult) (pediatric): Secondary | ICD-10-CM | POA: Diagnosis not present

## 2024-01-12 DIAGNOSIS — L97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 with unspecified severity: Secondary | ICD-10-CM | POA: Diagnosis not present

## 2024-01-12 DIAGNOSIS — Z9049 Acquired absence of other specified parts of digestive tract: Secondary | ICD-10-CM | POA: Diagnosis not present

## 2024-01-12 DIAGNOSIS — E11622 Type 2 diabetes mellitus with other skin ulcer: Secondary | ICD-10-CM | POA: Diagnosis not present

## 2024-01-12 DIAGNOSIS — K760 Fatty (change of) liver, not elsewhere classified: Secondary | ICD-10-CM | POA: Diagnosis not present

## 2024-01-19 ENCOUNTER — Encounter (HOSPITAL_BASED_OUTPATIENT_CLINIC_OR_DEPARTMENT_OTHER): Admitting: General Surgery

## 2024-01-19 ENCOUNTER — Other Ambulatory Visit (HOSPITAL_BASED_OUTPATIENT_CLINIC_OR_DEPARTMENT_OTHER): Payer: Self-pay | Admitting: General Surgery

## 2024-01-19 DIAGNOSIS — L98499 Non-pressure chronic ulcer of skin of other sites with unspecified severity: Secondary | ICD-10-CM | POA: Diagnosis not present

## 2024-01-19 DIAGNOSIS — G4733 Obstructive sleep apnea (adult) (pediatric): Secondary | ICD-10-CM | POA: Diagnosis not present

## 2024-01-19 DIAGNOSIS — L308 Other specified dermatitis: Secondary | ICD-10-CM | POA: Diagnosis not present

## 2024-01-19 DIAGNOSIS — L97812 Non-pressure chronic ulcer of other part of right lower leg with fat layer exposed: Secondary | ICD-10-CM | POA: Diagnosis not present

## 2024-01-19 DIAGNOSIS — Z9049 Acquired absence of other specified parts of digestive tract: Secondary | ICD-10-CM | POA: Diagnosis not present

## 2024-01-19 DIAGNOSIS — L97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 with unspecified severity: Secondary | ICD-10-CM | POA: Diagnosis not present

## 2024-01-19 DIAGNOSIS — E11622 Type 2 diabetes mellitus with other skin ulcer: Secondary | ICD-10-CM | POA: Diagnosis not present

## 2024-01-19 DIAGNOSIS — K760 Fatty (change of) liver, not elsewhere classified: Secondary | ICD-10-CM | POA: Diagnosis not present

## 2024-01-19 DIAGNOSIS — Z6841 Body Mass Index (BMI) 40.0 and over, adult: Secondary | ICD-10-CM | POA: Diagnosis not present

## 2024-01-21 LAB — DERMATOLOGY PATHOLOGY

## 2024-01-27 ENCOUNTER — Encounter (HOSPITAL_BASED_OUTPATIENT_CLINIC_OR_DEPARTMENT_OTHER): Admitting: General Surgery

## 2024-01-27 DIAGNOSIS — E11622 Type 2 diabetes mellitus with other skin ulcer: Secondary | ICD-10-CM | POA: Diagnosis not present

## 2024-01-27 DIAGNOSIS — G4733 Obstructive sleep apnea (adult) (pediatric): Secondary | ICD-10-CM | POA: Diagnosis not present

## 2024-01-27 DIAGNOSIS — S81801A Unspecified open wound, right lower leg, initial encounter: Secondary | ICD-10-CM | POA: Diagnosis not present

## 2024-01-27 DIAGNOSIS — Z6841 Body Mass Index (BMI) 40.0 and over, adult: Secondary | ICD-10-CM | POA: Diagnosis not present

## 2024-01-27 DIAGNOSIS — Z9049 Acquired absence of other specified parts of digestive tract: Secondary | ICD-10-CM | POA: Diagnosis not present

## 2024-01-27 DIAGNOSIS — K760 Fatty (change of) liver, not elsewhere classified: Secondary | ICD-10-CM | POA: Diagnosis not present

## 2024-01-27 DIAGNOSIS — L97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 with unspecified severity: Secondary | ICD-10-CM | POA: Diagnosis not present

## 2024-01-27 DIAGNOSIS — S91111A Laceration without foreign body of right great toe without damage to nail, initial encounter: Secondary | ICD-10-CM | POA: Diagnosis not present

## 2024-01-29 DIAGNOSIS — E1169 Type 2 diabetes mellitus with other specified complication: Secondary | ICD-10-CM

## 2024-01-29 MED ORDER — TADALAFIL 5 MG PO TABS: ORAL_TABLET | ORAL | 3 refills | Status: DC

## 2024-01-29 MED ORDER — OZEMPIC (0.25 OR 0.5 MG/DOSE) 2 MG/3ML ~~LOC~~ SOPN: 1.0000 mg | PEN_INJECTOR | SUBCUTANEOUS | 1 refills | Status: DC

## 2024-01-29 NOTE — Telephone Encounter (Signed)
 We didn't refill Tadalafil last. Medication pend and sent to PCP [name], [name], NP

## 2024-01-30 ENCOUNTER — Other Ambulatory Visit: Payer: Self-pay | Admitting: Family

## 2024-01-30 DIAGNOSIS — E1169 Type 2 diabetes mellitus with other specified complication: Secondary | ICD-10-CM

## 2024-01-31 ENCOUNTER — Other Ambulatory Visit: Payer: Self-pay | Admitting: Medical

## 2024-02-01 NOTE — Telephone Encounter (Signed)
 Prescriptions refilled by PCP [name], [name], NP

## 2024-02-01 NOTE — Telephone Encounter (Signed)
Pharmacy is requesting an alternative. 

## 2024-02-02 ENCOUNTER — Encounter (HOSPITAL_BASED_OUTPATIENT_CLINIC_OR_DEPARTMENT_OTHER): Attending: General Surgery | Admitting: General Surgery

## 2024-02-02 DIAGNOSIS — E11622 Type 2 diabetes mellitus with other skin ulcer: Secondary | ICD-10-CM | POA: Diagnosis not present

## 2024-02-02 DIAGNOSIS — Z6841 Body Mass Index (BMI) 40.0 and over, adult: Secondary | ICD-10-CM | POA: Diagnosis not present

## 2024-02-02 DIAGNOSIS — L97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 with unspecified severity: Secondary | ICD-10-CM | POA: Diagnosis not present

## 2024-02-02 DIAGNOSIS — L97812 Non-pressure chronic ulcer of other part of right lower leg with fat layer exposed: Secondary | ICD-10-CM | POA: Diagnosis not present

## 2024-02-02 DIAGNOSIS — S91011A Laceration without foreign body, right ankle, initial encounter: Secondary | ICD-10-CM | POA: Diagnosis not present

## 2024-02-03 NOTE — Telephone Encounter (Signed)
 Please verify with pharmacy which other alternative is covered by patient's insurance.

## 2024-02-10 ENCOUNTER — Encounter (HOSPITAL_BASED_OUTPATIENT_CLINIC_OR_DEPARTMENT_OTHER): Admitting: General Surgery

## 2024-02-10 DIAGNOSIS — E11622 Type 2 diabetes mellitus with other skin ulcer: Secondary | ICD-10-CM | POA: Diagnosis not present

## 2024-02-10 DIAGNOSIS — L97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 with unspecified severity: Secondary | ICD-10-CM | POA: Diagnosis not present

## 2024-02-10 DIAGNOSIS — L97812 Non-pressure chronic ulcer of other part of right lower leg with fat layer exposed: Secondary | ICD-10-CM | POA: Diagnosis not present

## 2024-02-10 DIAGNOSIS — Z6841 Body Mass Index (BMI) 40.0 and over, adult: Secondary | ICD-10-CM | POA: Diagnosis not present

## 2024-02-10 DIAGNOSIS — S91011A Laceration without foreign body, right ankle, initial encounter: Secondary | ICD-10-CM | POA: Diagnosis not present

## 2024-02-17 ENCOUNTER — Ambulatory Visit (HOSPITAL_BASED_OUTPATIENT_CLINIC_OR_DEPARTMENT_OTHER): Admitting: General Surgery

## 2024-02-19 ENCOUNTER — Encounter (HOSPITAL_BASED_OUTPATIENT_CLINIC_OR_DEPARTMENT_OTHER): Admitting: General Surgery

## 2024-02-19 DIAGNOSIS — L97812 Non-pressure chronic ulcer of other part of right lower leg with fat layer exposed: Secondary | ICD-10-CM | POA: Diagnosis not present

## 2024-02-19 DIAGNOSIS — L97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 with unspecified severity: Secondary | ICD-10-CM | POA: Diagnosis not present

## 2024-02-19 DIAGNOSIS — Z6841 Body Mass Index (BMI) 40.0 and over, adult: Secondary | ICD-10-CM | POA: Diagnosis not present

## 2024-02-19 DIAGNOSIS — E11622 Type 2 diabetes mellitus with other skin ulcer: Secondary | ICD-10-CM | POA: Diagnosis not present

## 2024-02-19 DIAGNOSIS — S91011A Laceration without foreign body, right ankle, initial encounter: Secondary | ICD-10-CM | POA: Diagnosis not present

## 2024-02-19 DIAGNOSIS — I872 Venous insufficiency (chronic) (peripheral): Secondary | ICD-10-CM | POA: Diagnosis not present

## 2024-02-23 ENCOUNTER — Encounter (HOSPITAL_BASED_OUTPATIENT_CLINIC_OR_DEPARTMENT_OTHER): Admitting: General Surgery

## 2024-02-23 DIAGNOSIS — B9561 Methicillin susceptible Staphylococcus aureus infection as the cause of diseases classified elsewhere: Secondary | ICD-10-CM | POA: Diagnosis not present

## 2024-02-23 DIAGNOSIS — Z6841 Body Mass Index (BMI) 40.0 and over, adult: Secondary | ICD-10-CM | POA: Diagnosis not present

## 2024-02-23 DIAGNOSIS — E11622 Type 2 diabetes mellitus with other skin ulcer: Secondary | ICD-10-CM | POA: Diagnosis not present

## 2024-02-23 DIAGNOSIS — S91011A Laceration without foreign body, right ankle, initial encounter: Secondary | ICD-10-CM | POA: Diagnosis not present

## 2024-02-23 DIAGNOSIS — L97812 Non-pressure chronic ulcer of other part of right lower leg with fat layer exposed: Secondary | ICD-10-CM | POA: Diagnosis not present

## 2024-02-23 DIAGNOSIS — L97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 with unspecified severity: Secondary | ICD-10-CM | POA: Diagnosis not present

## 2024-02-24 ENCOUNTER — Encounter (HOSPITAL_BASED_OUTPATIENT_CLINIC_OR_DEPARTMENT_OTHER): Admitting: General Surgery

## 2024-02-24 DIAGNOSIS — L97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 with unspecified severity: Secondary | ICD-10-CM | POA: Diagnosis not present

## 2024-02-24 DIAGNOSIS — E11622 Type 2 diabetes mellitus with other skin ulcer: Secondary | ICD-10-CM | POA: Diagnosis not present

## 2024-02-24 DIAGNOSIS — Z6841 Body Mass Index (BMI) 40.0 and over, adult: Secondary | ICD-10-CM | POA: Diagnosis not present

## 2024-03-01 ENCOUNTER — Encounter (HOSPITAL_BASED_OUTPATIENT_CLINIC_OR_DEPARTMENT_OTHER): Admitting: General Surgery

## 2024-03-01 DIAGNOSIS — E11622 Type 2 diabetes mellitus with other skin ulcer: Secondary | ICD-10-CM | POA: Diagnosis not present

## 2024-03-01 DIAGNOSIS — L97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 with unspecified severity: Secondary | ICD-10-CM | POA: Diagnosis not present

## 2024-03-01 DIAGNOSIS — L97812 Non-pressure chronic ulcer of other part of right lower leg with fat layer exposed: Secondary | ICD-10-CM | POA: Diagnosis not present

## 2024-03-01 DIAGNOSIS — S91011A Laceration without foreign body, right ankle, initial encounter: Secondary | ICD-10-CM | POA: Diagnosis not present

## 2024-03-01 DIAGNOSIS — L97312 Non-pressure chronic ulcer of right ankle with fat layer exposed: Secondary | ICD-10-CM | POA: Diagnosis not present

## 2024-03-01 DIAGNOSIS — Z6841 Body Mass Index (BMI) 40.0 and over, adult: Secondary | ICD-10-CM | POA: Diagnosis not present

## 2024-03-09 ENCOUNTER — Ambulatory Visit: Admitting: Psychology

## 2024-03-10 ENCOUNTER — Ambulatory Visit (HOSPITAL_BASED_OUTPATIENT_CLINIC_OR_DEPARTMENT_OTHER): Admitting: Internal Medicine

## 2024-03-10 ENCOUNTER — Ambulatory Visit (INDEPENDENT_AMBULATORY_CARE_PROVIDER_SITE_OTHER): Admitting: Podiatry

## 2024-03-10 ENCOUNTER — Encounter: Payer: Self-pay | Admitting: Podiatry

## 2024-03-10 ENCOUNTER — Ambulatory Visit: Admitting: Podiatry

## 2024-03-10 ENCOUNTER — Ambulatory Visit

## 2024-03-10 DIAGNOSIS — M009 Pyogenic arthritis, unspecified: Secondary | ICD-10-CM

## 2024-03-10 DIAGNOSIS — M14671 Charcot's joint, right ankle and foot: Secondary | ICD-10-CM | POA: Diagnosis not present

## 2024-03-10 DIAGNOSIS — M10371 Gout due to renal impairment, right ankle and foot: Secondary | ICD-10-CM | POA: Diagnosis not present

## 2024-03-10 DIAGNOSIS — S99911A Unspecified injury of right ankle, initial encounter: Secondary | ICD-10-CM | POA: Diagnosis not present

## 2024-03-10 NOTE — Patient Instructions (Signed)
 Call [facility] to schedule your MRI at the below locations.  Please allow at least 1 business day after your visit to process the referral.  It may take longer depending on approval from insurance.  Please let me know if you have issues or problems scheduling the MRI   [facility] [place] [place] [phone] [address] [place], [place] [address]  [facility] [phone] [address] [place], [place] [address]    VISIT SUMMARY: Today, you were seen for swelling and pain in your right ankle, which has worsened recently and now includes drainage and pus. 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 We discussed your current symptoms and planned further evaluations and treatments.  YOUR PLAN: -FULL THICKNESS ULCERATIONS ON RIGHT LOWER EXTREMITY: You have deep sores on your right lower leg that have not improved and have developed new issues. We will continue your wound care management with Dr. Leslye Rast and apply Xeroform and a single layer compression wrap to the area.  -EDEMA OF RIGHT ANKLE: You have swelling and pain in your right ankle, which may be due to your previous clubfoot deformity or arthritis. We will order an MRI and perform lab work to check for potential infection, gout, or other causes. Please follow up in three weeks or sooner if your symptoms worsen.  -ANKLE ARTHRITIS: You have arthritis in your right ankle, which means the joint is wearing down and causing pain. We will consider a joint aspiration if the MRI shows fluid collection to help determine the cause and guide treatment.  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  -DIABETES MELLITUS: Your diabetes is well-managed with good recent A1c levels. Continue monitoring your blood glucose levels to prevent  complications.  INSTRUCTIONS: Please schedule an MRI of your right ankle and complete the lab work including CBC, ESR, CRP, BMP, and uric acid. Follow up in three weeks or sooner if your symptoms worsen.                      Contains text generated by Abridge.                                 Contains text generated by Abridge.

## 2024-03-11 ENCOUNTER — Encounter (HOSPITAL_BASED_OUTPATIENT_CLINIC_OR_DEPARTMENT_OTHER): Attending: Internal Medicine | Admitting: Internal Medicine

## 2024-03-11 ENCOUNTER — Ambulatory Visit
Admission: RE | Admit: 2024-03-11 | Discharge: 2024-03-11 | Disposition: A | Discharge status: Home / Self Care | Source: Ambulatory Visit | Attending: Podiatry

## 2024-03-11 ENCOUNTER — Encounter: Payer: Self-pay | Admitting: Podiatry

## 2024-03-11 DIAGNOSIS — M14671 Charcot's joint, right ankle and foot: Secondary | ICD-10-CM

## 2024-03-11 DIAGNOSIS — S91011A Laceration without foreign body, right ankle, initial encounter: Secondary | ICD-10-CM | POA: Diagnosis not present

## 2024-03-11 DIAGNOSIS — M009 Pyogenic arthritis, unspecified: Secondary | ICD-10-CM | POA: Diagnosis not present

## 2024-03-11 DIAGNOSIS — E11622 Type 2 diabetes mellitus with other skin ulcer: Secondary | ICD-10-CM | POA: Insufficient documentation

## 2024-03-11 DIAGNOSIS — L97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 with unspecified severity: Secondary | ICD-10-CM | POA: Insufficient documentation

## 2024-03-11 DIAGNOSIS — S86211A Strain of muscle(s) and tendon(s) of anterior muscle group at lower leg level, right leg, initial encounter: Secondary | ICD-10-CM | POA: Diagnosis not present

## 2024-03-11 DIAGNOSIS — L02415 Cutaneous abscess of right lower limb: Secondary | ICD-10-CM | POA: Diagnosis not present

## 2024-03-11 DIAGNOSIS — L97312 Non-pressure chronic ulcer of right ankle with fat layer exposed: Secondary | ICD-10-CM | POA: Diagnosis not present

## 2024-03-11 DIAGNOSIS — L97812 Non-pressure chronic ulcer of other part of right lower leg with fat layer exposed: Secondary | ICD-10-CM | POA: Diagnosis not present

## 2024-03-11 DIAGNOSIS — M7671 Peroneal tendinitis, right leg: Secondary | ICD-10-CM | POA: Diagnosis not present

## 2024-03-11 DIAGNOSIS — R600 Localized edema: Secondary | ICD-10-CM | POA: Diagnosis not present

## 2024-03-11 MED ORDER — GADOPICLENOL 0.5 MMOL/ML IV SOLN
10.0000 mL | Freq: Once | INTRAVENOUS | Status: AC | PRN
  Administered 2024-03-11: 10 mL via INTRAVENOUS

## 2024-03-12 LAB — CBC WITH DIFFERENTIAL/PLATELET
Basophils Absolute: 0.1 10*3/uL (ref 0.0–0.2)
Basos: 1 %
EOS (ABSOLUTE): 0 10*3/uL (ref 0.0–0.4)
Eos: 1 %
Hematocrit: 41.1 % (ref 37.5–51.0)
Hemoglobin: 14 g/dL (ref 13.0–17.7)
Immature Grans (Abs): 0.1 10*3/uL (ref 0.0–0.1)
Immature Granulocytes: 2 %
Lymphocytes Absolute: 1.2 10*3/uL (ref 0.7–3.1)
Lymphs: 29 %
MCH: 29.9 pg (ref 26.6–33.0)
MCHC: 34.1 g/dL (ref 31.5–35.7)
MCV: 88 fL (ref 79–97)
Monocytes Absolute: 0.2 10*3/uL (ref 0.1–0.9)
Monocytes: 6 %
Neutrophils Absolute: 2.6 10*3/uL (ref 1.4–7.0)
Neutrophils: 61 %
Platelets: 122 10*3/uL — ABNORMAL LOW (ref 150–450)
RBC: 4.69 x10E6/uL (ref 4.14–5.80)
RDW: 14.3 % (ref 11.6–15.4)
WBC: 4.1 10*3/uL (ref 3.4–10.8)

## 2024-03-12 LAB — URIC ACID: Uric Acid: 8.7 mg/dL — ABNORMAL HIGH (ref 3.8–8.4)

## 2024-03-12 LAB — BASIC METABOLIC PANEL WITH GFR
BUN/Creatinine Ratio: 11 (ref 9–20)
BUN: 16 mg/dL (ref 6–24)
CO2: 20 mmol/L (ref 20–29)
Calcium: 9.3 mg/dL (ref 8.7–10.2)
Chloride: 103 mmol/L (ref 96–106)
Creatinine, Ser: 1.45 mg/dL — ABNORMAL HIGH (ref 0.76–1.27)
Glucose: 122 mg/dL — ABNORMAL HIGH (ref 70–99)
Potassium: 4 mmol/L (ref 3.5–5.2)
Sodium: 141 mmol/L (ref 134–144)
eGFR: 61 mL/min/{1.73_m2} (ref >59)

## 2024-03-12 LAB — C-REACTIVE PROTEIN: CRP: 2 mg/L (ref 0–10)

## 2024-03-12 LAB — SEDIMENTATION RATE: Sed Rate: 10 mm/h (ref 0–15)

## 2024-03-13 ENCOUNTER — Encounter: Payer: Self-pay | Admitting: Podiatry

## 2024-03-13 MED ORDER — COLCHICINE 0.6 MG PO TABS: ORAL_TABLET | ORAL | 2 refills | Status: DC

## 2024-03-13 NOTE — Progress Notes (Signed)
 Subjective:  Patient ID: [name], male    DOB: February 22, 1976,  MRN: [id number]  Chief Complaint  Patient presents with   Foot Pain    RM3: new patient-swollen, red, painful medial right ankle-    Discussed the use of AI scribe software for clinical note transcription with the patient, who gave verbal consent to proceed.  History of Present Illness [name] is a 48 year old male with diabetes and a history of clubfoot who presents with a swollen and painful right ankle.  He has been experiencing swelling and pain in his right ankle, which has worsened recently. There is new drainage and pus from the area. The pain is intermittent and worsens with prolonged walking, limiting his activities.  He has a history of diabetes, which is well-managed as indicated by recent good A1c levels. No numbness or neuropathy in his feet. Previous evaluations to rule out blood flow issues were negative.  He underwent surgery on his right ankle as a baby due to clubfoot. He recalls similar issues a few years ago but without the current wound complications. No history of gout.  He is currently receiving compression therapy for his condition. The wound initially started to improve but then worsened, with additional complications arising. No use of tissue substitutes or grafts on the wound.      Objective:    Physical Exam VASCULAR: DP and PT pulse palpable. Foot is warm and well-perfused. Capillary fill time is brisk. DERMATOLOGIC: Normal skin turgor, texture, and temperature. No open lesions or rashes. Full thickness ulcerations on the right lower extremity gator area. No purulent drainage, granular wound beds. NEUROLOGIC: Normal sensation to light touch and pressure. No paresthesias. ORTHOPEDIC: Pain, swelling, and edema with limited range of motion of the right ankle joint.   EXTREMITIES: Edema well controlled.   No images are attached to the encounter.    Results Procedure: Wound dressing  application Description: Xeroform applied to the wound. Single layer compression wrap applied to the right lower extremity.  RADIOLOGY Ankle radiographs: Previous clubfoot deformity with flattened navicular, large lucency of the medial central talar dome (03/10/2024)   Assessment:   1. 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 Septic arthritis of right ankle, due to unspecified organism (HCC)   3. Charcot joint of right ankle      Plan:  Patient was evaluated and treated and all questions answered.  Assessment and Plan Assessment & Plan Full thickness ulcerations on right lower extremity Chronic full thickness ulcerations on the gator area of the right lower leg with no purulent drainage and granular wound beds. Edema is well controlled. The ulcerations have not improved and new issues have developed. - Continue wound care management with Dr. Leslye Rast - Apply Xeroform and single layer compression wrap  Edema of right ankle Intermittent pain and swelling of the right ankle with limited range of motion. Edema is well controlled today. Possible contributing factors include previous clubfoot deformity and ankle arthritis. MRI and lab work are planned to evaluate the joint and surrounding tissue for potential infection, gout, or other causes. - Order MRI of the right ankle to evaluate joint and surrounding tissue - Perform lab work including CBC, ESR, CRP, BMP, and uric acid - Schedule follow-up in three weeks or sooner if symptoms worsen  Ankle arthritis Arthritis in the right ankle with thinning of the joint space and irregular joint surface. Possible causes include wear and tear, previous surgery, and foot  deformity. Differential diagnosis includes gout, septic arthritis, and Charcot neuropathy. MRI and lab work will help determine the underlying cause and guide treatment. - Consider joint aspiration if MRI indicates fluid collection  Clubfoot deformity Previous  surgery for clubfoot deformity. Current ankle issues may be related to this history.  Diabetes mellitus Diabetes mellitus with good recent A1c control. No current signs of neuropathy. Continued monitoring is essential to prevent complications. - Continue monitoring blood glucose levels      Return in about 3 weeks (around 03/31/2024) for after MRI to review, after lab work to review.

## 2024-03-16 ENCOUNTER — Encounter (HOSPITAL_BASED_OUTPATIENT_CLINIC_OR_DEPARTMENT_OTHER): Admitting: Internal Medicine

## 2024-03-16 DIAGNOSIS — S91011A Laceration without foreign body, right ankle, initial encounter: Secondary | ICD-10-CM | POA: Diagnosis not present

## 2024-03-16 DIAGNOSIS — L97812 Non-pressure chronic ulcer of other part of right lower leg with fat layer exposed: Secondary | ICD-10-CM | POA: Diagnosis not present

## 2024-03-16 DIAGNOSIS — E11622 Type 2 diabetes mellitus with other skin ulcer: Secondary | ICD-10-CM | POA: Diagnosis not present

## 2024-03-16 DIAGNOSIS — L97312 Non-pressure chronic ulcer of right ankle with fat layer exposed: Secondary | ICD-10-CM | POA: Diagnosis not present

## 2024-03-16 DIAGNOSIS — L02415 Cutaneous abscess of right lower limb: Secondary | ICD-10-CM | POA: Diagnosis not present

## 2024-03-16 DIAGNOSIS — L97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 with unspecified severity: Secondary | ICD-10-CM | POA: Diagnosis not present

## 2024-03-23 ENCOUNTER — Encounter (HOSPITAL_BASED_OUTPATIENT_CLINIC_OR_DEPARTMENT_OTHER): Admitting: General Surgery

## 2024-03-23 DIAGNOSIS — L02415 Cutaneous abscess of right lower limb: Secondary | ICD-10-CM | POA: Diagnosis not present

## 2024-03-23 DIAGNOSIS — E11622 Type 2 diabetes mellitus with other skin ulcer: Secondary | ICD-10-CM | POA: Diagnosis not present

## 2024-03-23 DIAGNOSIS — L97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 with unspecified severity: Secondary | ICD-10-CM | POA: Diagnosis not present

## 2024-03-23 DIAGNOSIS — L97311 Non-pressure chronic ulcer of right ankle limited to breakdown of skin: Secondary | ICD-10-CM | POA: Diagnosis not present

## 2024-03-30 ENCOUNTER — Encounter (HOSPITAL_BASED_OUTPATIENT_CLINIC_OR_DEPARTMENT_OTHER): Admitting: General Surgery

## 2024-03-30 ENCOUNTER — Ambulatory Visit (INDEPENDENT_AMBULATORY_CARE_PROVIDER_SITE_OTHER): Admitting: Psychology

## 2024-03-30 DIAGNOSIS — L97312 Non-pressure chronic ulcer of right ankle with fat layer exposed: Secondary | ICD-10-CM | POA: Diagnosis not present

## 2024-03-30 DIAGNOSIS — L97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 with unspecified severity: Secondary | ICD-10-CM | POA: Diagnosis not present

## 2024-03-30 DIAGNOSIS — E11622 Type 2 diabetes mellitus with other skin ulcer: Secondary | ICD-10-CM | POA: Diagnosis not present

## 2024-03-30 DIAGNOSIS — L02415 Cutaneous abscess of right lower limb: Secondary | ICD-10-CM | POA: Diagnosis not present

## 2024-03-30 DIAGNOSIS — F4323 Adjustment disorder with mixed anxiety and depressed mood: Secondary | ICD-10-CM | POA: Diagnosis not present

## 2024-03-30 DIAGNOSIS — L97812 Non-pressure chronic ulcer of other part of right lower leg with fat layer exposed: Secondary | ICD-10-CM | POA: Diagnosis not present

## 2024-03-30 NOTE — Progress Notes (Signed)
 [facility] Counselor/Therapist Progress Note  Patient ID: [name], MRN: [id number],    Date: 03/30/2024  Time Spent: 60 mins; start time: 1100; end time: 1200  Treatment Type: Individual Therapy  Reported Symptoms: Pt presents for session via Caregility video.  Pt grants consent for session, stating he is in his home with no one else present; pt also shares that he understands the limitations of virtual session.  I shared with pt that I am in my office with no one else here either.  Mental Status Exam: Appearance:  Casual     Behavior: Appropriate  Motor: Normal  Speech/Language:  Clear and Coherent  Affect: Appropriate  Mood: normal  Thought process: normal  Thought content:   WNL  Sensory/Perceptual disturbances:   WNL  Orientation: oriented to person, place, and time/date  Attention: Good  Concentration: Good  Memory: WNL  Fund of knowledge:  Good  Insight:   Good  Judgment:  Good  Impulse Control: Good   Risk Assessment: Danger to Self:  No Self-injurious Behavior: No Danger to Others: No Duty to Warn:no Physical Aggression / Violence:No  Access to Firearms a concern: No  Gang Involvement:No   Subjective: Pt shares that, "I have been ok since our last session (12/22/23).  My mom has recovered from her breast cancer surgery.  My biological son turned 52 yo; I felt like I wanted to do more for him than I could do.  That made me feel some sort of way."  Pt shares that his son graduates from [employer] in June and is planning to go to a trade school in [place] for Brewing technologist and fabrication; he has to be in [employer] on 05/03/24.  His son is in a Holiday representative class at [employer] this year and the reps from [employer] came to [place] to recruit students and his son liked the presentation.  It is a 9 month program and they have job placement assistance for life.  Pt shares that he had a wound care appt this morning; his original wound is completely healed but other wounds have opened  as a result of the treatment; he shares that his ankle is swollen as well; he is frustrated and trying not to get overwhelmed by the issues with his ankle.  He has been going to Wound Care for months.  Pt shares that his 86 yo grandmother passed away last 04/28/24; she had stopped eating very much.  The memorial service will be next Friday (6/6) in [place], [place] and pt will be taking his mom to the service.  Pt had had several conversations with his grandmother over the past year and she was ready to go and he feels OK with that.  She left pt a time share in [place].  Pt shares that his wife's sister (in [place]) was fostering a young girl and his sister-in-law wanted to adopt the girl; the court decided to give the girl back to the biological mom; the biological mom moved to [place] and now is ill and is not expected to live.  Pt shares his work is going well; he has been preforming well and he is likely to earn a bonus this quarter; he is still working from home and he is happy about that.  Pt continues to have Tues, Wed, and Sat off from work each week.  His oldest son is still working 3rd shift at [employer] and he seems to be enjoying it.  Encouraged pt to be intentional about  engaging in his self care activities and we will meet in 2 weeks for a follow up session.  Interventions: Cognitive Behavioral Therapy  Diagnosis:Adjustment disorder with mixed anxiety and depressed mood  Plan: Treatment Plan Strengths/Abilities:  Intelligent, Intuitive, Willing to participate in therapy Treatment Preferences:  Outpatient Individual Therapy Statement of Needs:  Patient is to use CBT, mindfulness and coping skills to help manage and/or decrease symptoms associated with their diagnosis. Symptoms:  Depressed/Irritable mood, worry, social withdrawal Problems Addressed:  Depressive thoughts, Sadness, Sleep issues, etc. Long Term Goals:  Pt to reduce overall level, frequency, and intensity of the feelings of  depression/anxiety as evidenced by decreased irritability, negative self talk, and helpless feelings from 6 to 7 days/week to 0 to 1 days/week, per client report, for at least 3 consecutive months.  Progress: 30% Short Term Goals:  Pt to verbally express understanding of the relationship between feelings of depression/anxiety and their impact on thinking patterns and behaviors.  Pt to verbalize an understanding of the role that distorted thinking plays in creating fears, excessive worry, and ruminations.  Progress: 30% Target Date:  04/24/2024 Frequency:  Bi-weekly Modality:  Cognitive Behavioral Therapy Interventions by Therapist:  Therapist will use CBT, Mindfulness exercises, Coping skills and Referrals, as needed by client. Client has verbally approved this treatment plan.  [name], [facility]

## 2024-04-05 ENCOUNTER — Ambulatory Visit (INDEPENDENT_AMBULATORY_CARE_PROVIDER_SITE_OTHER): Admitting: Podiatry

## 2024-04-05 DIAGNOSIS — M19071 Primary osteoarthritis, right ankle and foot: Secondary | ICD-10-CM

## 2024-04-05 DIAGNOSIS — M10371 Gout due to renal impairment, right ankle and foot: Secondary | ICD-10-CM | POA: Diagnosis not present

## 2024-04-05 DIAGNOSIS — Q6601 Congenital talipes equinovarus, right foot: Secondary | ICD-10-CM | POA: Diagnosis not present

## 2024-04-05 NOTE — Progress Notes (Signed)
 Subjective:  Patient ID: [name], male    DOB: Mar 09, 1976,  MRN: [id number]  No chief complaint on file.   48 y.o. male presents with the above complaint. History confirmed with patient.  He returns for follow-up after completing his lab work and MRI he states that the colchicine  helped a little bit but has not eliminated the issue the swelling is much better his wounds are nearly healed  Objective:  Physical Exam: warm, good capillary refill, normal DP and PT pulses, normal sensory exam, and right ankle edema improved he has partial-thickness ulcerations on the lower leg with improved edema.   Study Result  Narrative & Impression  CLINICAL DATA:  Septic arthritis, pain and swelling, wound along the top of the ankle   EXAM: MRI OF THE RIGHT ANKLE WITHOUT AND WITH CONTRAST   TECHNIQUE: Multiplanar, multisequence MR imaging of the ankle was performed before and after the administration of intravenous contrast.   CONTRAST:  10 cc Vueway    COMPARISON:  Multiple exams, including 03/10/2024   FINDINGS: TENDONS   Peroneal: Mild common peroneus tendon sheath tenosynovitis.   Posteromedial: Complete tear or high-grade partial tear of the tibialis posterior tendon, highly indistinct distally.   Anterior: Unremarkable   Achilles: Unusual rounded cord-like Achilles tendon with mild Achilles tendinopathy.   Plantar Fascia: Unremarkable   LIGAMENTS   Lateral: Unremarkable   Medial: Ill-defined deep tibiotalar portion of the deltoid ligament, likely from chronic degeneration. Poor definition of the tibiospring and tibionavicular components. Remote tear not excluded. Spring ligament appears ragged and potentially torn.   CARTILAGE   Ankle Joint: 1.9 by 1.0 by 2.7 cm osteochondral lesion of the posterior talar dome with surrounding marrow edema. This osteochondral lesion is been present on prior exams including 08/04/2023, and accordingly is not acute. Prominent  chondral thinning in the tibiotalar articulation. No significant tibiotalar joint effusion although there may be some trace synovitis.   Subtalar Joints/Sinus Tarsi: Degenerative findings along the medial subtalar facet with small degenerative subcortical lesions noted along both the calcaneal and talar sides of the joint.   Bones: Spurring of the anterior distal tibiofibular articulation with loss of articular space. Mild synovitis between the distal tibia and fibula suggested for example on image 9 series 104.   Other: Subcutaneous edema medially and laterally along the ankle.   IMPRESSION: IMPRESSION 1. Complete tear or high-grade partial tear of the tibialis posterior tendon, highly indistinct distally. 2. Mild common peroneus tendon sheath tenosynovitis. 3. Unusual rounded cord-like Achilles tendon with mild Achilles tendinopathy. 4. Ill-defined deep tibiotalar portion of the deltoid ligament, likely from chronic degeneration. Poor definition of the tibiospring and tibionavicular components. Remote tear not excluded. 5. 1.9 by 1.0 by 2.7 cm osteochondral lesion of the posterior talar dome with surrounding marrow edema. This osteochondral lesion has been present on prior exams including 08/04/2023, and accordingly is not acute. 6. Degenerative findings along the medial subtalar facet with small degenerative subcortical lesions noted along both the calcaneal and talar sides of the joint. 7. Spurring of the anterior distal tibiofibular articulation with loss of articular space. Mild synovitis between the distal tibia and fibula suggested. 8. Subcutaneous edema medially and laterally along the ankle.     Electronically Signed   By: Freida Jes M.D.   On: 03/11/2024 15:29     Results  Collected Updated Procedure   03/11/2024 1501 03/12/2024 0537 Uric Acid [[id number]]   (Abnormal)  Blood   Component Value Units  Uric Acid 8.7 High  mg/dL       16/08/9603  5409 03/12/2024 0537 Sedimentation Rate [[id number]]   Blood   Component Value Units  Sed Rate 10 mm/hr       03/11/2024 1501 03/12/2024 0537 CBC with Differential [[id number]]   (Abnormal)  Blood   Component Value Units  WBC 4.1 x10E3/uL  RBC 4.69 x10E6/uL  Hemoglobin 14.0 g/dL  Hematocrit 57.8 %  MCV 88 fL  MCH 29.9 pg  MCHC 34.1 g/dL  RDW 46.9 %  Platelets 122 Low  x10E3/uL  Neutrophils 61 %  Lymphs 29 %  Monocytes 6 %  Eos 1 %  Basos 1 %  Neutrophils Absolute 2.6 x10E3/uL  Lymphocytes Absolute 1.2 x10E3/uL  Monocytes Absolute 0.2 x10E3/uL  EOS (ABSOLUTE) 0.0 x10E3/uL  Basophils Absolute 0.1 x10E3/uL  Immature Granulocytes 2 %  Immature Grans (Abs) 0.1 x10E3/uL       03/11/2024 1501 03/12/2024 0537 C-reactive protein [[id number]]   Blood   Component Value Units  CRP 2 mg/L        Assessment:   1. 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  We discussed the results of his lab work and MRI and what this means for him long-term, he has had some improvement with colchicine  therapy for his gout I discussed with him that I would recommend following up with his PCP for long-term management as well as management of his renal function which is impacting and contribute to the gout flaring.  There were no evidence of infection or septic arthritis causing this on his lab work or MRI.  I discussed with him that his large osteochondral defect and ankle arthritis is sequela of his previous congenital clubfoot deformity and correction the remaining findings are consistent with clubfoot correction and surgery.  Long-term I do expect that he may need ankle arthrodesis to correct this, he has been frustrated that he has had this issue evaluated before but everyone has told him to hold off on surgery and I discussed with him that the nature of this type of condition and  surgery is typically a last stop treatment and until the ankle severity and pain is uncontrolled then I would hold off on surgery as well I did discuss the option of an ankle [place]  AFO and Rx for this was written for him today.  He will call to schedule. No follow-ups on file.

## 2024-04-06 DIAGNOSIS — F902 Attention-deficit hyperactivity disorder, combined type: Secondary | ICD-10-CM | POA: Diagnosis not present

## 2024-04-06 DIAGNOSIS — Z79899 Other long term (current) drug therapy: Secondary | ICD-10-CM | POA: Diagnosis not present

## 2024-04-07 ENCOUNTER — Encounter (HOSPITAL_BASED_OUTPATIENT_CLINIC_OR_DEPARTMENT_OTHER): Attending: General Surgery | Admitting: General Surgery

## 2024-04-07 DIAGNOSIS — L02415 Cutaneous abscess of right lower limb: Secondary | ICD-10-CM | POA: Insufficient documentation

## 2024-04-07 DIAGNOSIS — L97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 with unspecified severity: Secondary | ICD-10-CM | POA: Insufficient documentation

## 2024-04-07 DIAGNOSIS — E11622 Type 2 diabetes mellitus with other skin ulcer: Secondary | ICD-10-CM | POA: Insufficient documentation

## 2024-04-07 DIAGNOSIS — L97812 Non-pressure chronic ulcer of other part of right lower leg with fat layer exposed: Secondary | ICD-10-CM | POA: Diagnosis not present

## 2024-04-13 ENCOUNTER — Encounter (HOSPITAL_BASED_OUTPATIENT_CLINIC_OR_DEPARTMENT_OTHER): Admitting: General Surgery

## 2024-04-13 ENCOUNTER — Ambulatory Visit (INDEPENDENT_AMBULATORY_CARE_PROVIDER_SITE_OTHER): Admitting: Psychology

## 2024-04-13 DIAGNOSIS — L97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 with unspecified severity: Secondary | ICD-10-CM | POA: Diagnosis not present

## 2024-04-13 DIAGNOSIS — F4323 Adjustment disorder with mixed anxiety and depressed mood: Secondary | ICD-10-CM | POA: Diagnosis not present

## 2024-04-13 DIAGNOSIS — L97312 Non-pressure chronic ulcer of right ankle with fat layer exposed: Secondary | ICD-10-CM | POA: Diagnosis not present

## 2024-04-13 DIAGNOSIS — L02415 Cutaneous abscess of right lower limb: Secondary | ICD-10-CM | POA: Diagnosis not present

## 2024-04-13 DIAGNOSIS — E11622 Type 2 diabetes mellitus with other skin ulcer: Secondary | ICD-10-CM | POA: Diagnosis not present

## 2024-04-13 NOTE — Progress Notes (Signed)
 [facility] Counselor/Therapist Progress Note  Patient ID: [name], MRN: [id number],    Date: 04/13/2024  Time Spent: 60 mins; start time: 1000; end time: 1100  Treatment Type: Individual Therapy  Reported Symptoms: Pt presents for session via Caregility video.  Pt grants consent for session, stating he is in his home with no one else present; pt also shares that he understands the limitations of virtual session.  I shared with pt that I am in my office with no one else here either.  Mental Status Exam: Appearance:  Casual     Behavior: Appropriate  Motor: Normal  Speech/Language:  Clear and Coherent  Affect: Appropriate  Mood: normal  Thought process: normal  Thought content:   WNL  Sensory/Perceptual disturbances:   WNL  Orientation: oriented to person, place, and time/date  Attention: Good  Concentration: Good  Memory: WNL  Fund of knowledge:  Good  Insight:   Good  Judgment:  Good  Impulse Control: Good   Risk Assessment: Danger to Self:  No Self-injurious Behavior: No Danger to Others: No Duty to Warn:no Physical Aggression / Violence:No  Access to Firearms a concern: No  Gang Involvement:No   Subjective: Pt shares that, I have been ok since our last session (03/30/24).  It has been a tough week; I took my mom and dad to my grandmother's funeral in [place], [place] and that was a tough thing.  My son (their middle child) is graduating from [employer] tomorrow and then I only have the rest of June to spend time with him before they leave on 6/28.  His son will be attending school in [place], [place].  Pt, his wife, and his son's mom are all going to [employer] to take him to school Psychologist, prison and probation services and fabrication).  Encouraged pt to be intentional about reflecting on good times with his son and to share those memories with his son and to engage in conversations with his son about those memories.  Pt shares that work has been going well; he missed some work with an  illness and then used bereavement leave to go to his grandmother's funeral.  Pt has been with his employer for almost 15 yrs.  Pt shares that his oldest son (wife's biological son) has a new girlfriend who has a 46 yo son; pt shares that this new girlfriend is good for him and is helping him mature.  Pt has another wound care center appt this afternoon; he is still having issues with his ankle.  Pt shares that he had a club foot as a child and it was surgically corrected; the podiatrist has said he will likely always struggle with his ankle issues.  Pt is still on the path to earning a bonus for the current quarter and he remains hopeful.  Pt continues to work Sun, Mon, [place], and Fri and is off on Tues, Wed, and Sat.  His oldest son is still working 3rd shift at [employer] and he seems to be enjoying it.  Encouraged pt to be intentional about engaging in his self care activities and we will meet in 2 weeks for a follow up session.  Interventions: Cognitive Behavioral Therapy  Diagnosis:Adjustment disorder with mixed anxiety and depressed mood  Plan: Treatment Plan Strengths/Abilities:  Intelligent, Intuitive, Willing to participate in therapy Treatment Preferences:  Outpatient Individual Therapy Statement of Needs:  Patient is to use CBT, mindfulness and coping skills to help manage and/or decrease symptoms associated with their diagnosis. Symptoms:  Depressed/Irritable  mood, worry, social withdrawal Problems Addressed:  Depressive thoughts, Sadness, Sleep issues, etc. Long Term Goals:  Pt to reduce overall level, frequency, and intensity of the feelings of depression/anxiety as evidenced by decreased irritability, negative self talk, and helpless feelings from 6 to 7 days/week to 0 to 1 days/week, per client report, for at least 3 consecutive months.  Progress: 30% Short Term Goals:  Pt to verbally express understanding of the relationship between feelings of depression/anxiety and their impact on  thinking patterns and behaviors.  Pt to verbalize an understanding of the role that distorted thinking plays in creating fears, excessive worry, and ruminations.  Progress: 30% Target Date:  04/24/2025 Frequency:  Bi-weekly Modality:  Cognitive Behavioral Therapy Interventions by Therapist:  Therapist will use CBT, Mindfulness exercises, Coping skills and Referrals, as needed by client. Client has verbally approved this treatment plan.  [name], [facility]

## 2024-04-18 ENCOUNTER — Encounter (HOSPITAL_BASED_OUTPATIENT_CLINIC_OR_DEPARTMENT_OTHER): Admitting: General Surgery

## 2024-04-18 DIAGNOSIS — L02415 Cutaneous abscess of right lower limb: Secondary | ICD-10-CM | POA: Diagnosis not present

## 2024-04-18 DIAGNOSIS — L97812 Non-pressure chronic ulcer of other part of right lower leg with fat layer exposed: Secondary | ICD-10-CM | POA: Diagnosis not present

## 2024-04-18 DIAGNOSIS — E11622 Type 2 diabetes mellitus with other skin ulcer: Secondary | ICD-10-CM | POA: Diagnosis not present

## 2024-04-18 DIAGNOSIS — L97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 with unspecified severity: Secondary | ICD-10-CM | POA: Diagnosis not present

## 2024-04-18 DIAGNOSIS — L97312 Non-pressure chronic ulcer of right ankle with fat layer exposed: Secondary | ICD-10-CM | POA: Diagnosis not present

## 2024-04-27 ENCOUNTER — Ambulatory Visit (HOSPITAL_BASED_OUTPATIENT_CLINIC_OR_DEPARTMENT_OTHER): Admitting: General Surgery

## 2024-04-27 ENCOUNTER — Other Ambulatory Visit: Payer: Self-pay | Admitting: Family

## 2024-05-04 ENCOUNTER — Ambulatory Visit: Admitting: Psychology

## 2024-05-05 NOTE — Telephone Encounter (Signed)
 Patient needs a prior authorization for medication Ozempic . Can you confirm if this is for 0.25 or 0.5mg . The prescription says either or, but I need a specific for the PA. Message routed to Jereld Delude, NP

## 2024-05-06 NOTE — Telephone Encounter (Signed)
 Per Dinah, PCP, Ozempic  is 1 mg weekly, 2/25.

## 2024-05-09 NOTE — Telephone Encounter (Signed)
 Prior Authorization Submitted through CoverMyMeds Key: [id number] Awaiting Determination from [employer].

## 2024-05-10 ENCOUNTER — Ambulatory Visit: Payer: BC Managed Care – PPO | Admitting: Family

## 2024-05-11 ENCOUNTER — Ambulatory Visit (SKILLED_NURSING_FACILITY): Admitting: Adult Health

## 2024-05-11 ENCOUNTER — Encounter: Payer: Self-pay | Admitting: Adult Health

## 2024-05-11 VITALS — BP 148/80 | HR 88 | Temp 97.6°F | Resp 20 | Ht 69.0 in | Wt 330.4 lb

## 2024-05-11 DIAGNOSIS — E1159 Type 2 diabetes mellitus with other circulatory complications: Secondary | ICD-10-CM | POA: Diagnosis not present

## 2024-05-11 DIAGNOSIS — E1169 Type 2 diabetes mellitus with other specified complication: Secondary | ICD-10-CM | POA: Diagnosis not present

## 2024-05-11 DIAGNOSIS — I152 Hypertension secondary to endocrine disorders: Secondary | ICD-10-CM

## 2024-05-11 DIAGNOSIS — E669 Obesity, unspecified: Secondary | ICD-10-CM | POA: Diagnosis not present

## 2024-05-11 NOTE — Progress Notes (Signed)
 Location:  [facility]   Place of Service:   [facility] clinic    CODE STATUS:   No Known Allergies  Chief Complaint  Patient presents with   Medical Management of Chronic Issues    4 Month follow up     HPI:  He is here for routine visit. His blood pressure readings are elevated in the AM; and improve after taking his blood pressure medications. His cbg readings are pretty good. He had been in wound care for his right leg which has resolved. Is using compression socks. He has increased difficulty with walking long distance due to pain and swelling in his right ankle. We have talked about using a stationary bike.   Past Medical History:  Diagnosis Date   ADHD (attention deficit hyperactivity disorder)    Anxiety    Depression    Hypertension    Migraine    Nasal congestion 10/03/2014   Non-insulin  dependent type 2 diabetes mellitus (HCC)    Obesity    Sleep apnea    Wears contact lenses     Past Surgical History:  Procedure Laterality Date   CHOLECYSTECTOMY N/A 10/09/2014   Procedure: LAPAROSCOPIC CHOLECYSTECTOMY;  Surgeon: Vicenta Poli, MD;  Location: [facility];  Service: General;  Laterality: N/A;    Social History   Socioeconomic History   Marital status: Married    Spouse name: Not on file   Number of children: 1   Years of education: Not on file   Highest education level: Some college, no degree  Occupational History   Not on file  Tobacco Use   Smoking status: Never   Smokeless tobacco: Never  Vaping Use   Vaping status: Never Used  Substance and Sexual Activity   Alcohol use: Yes    Comment: occasional   Drug use: No   Sexual activity: Yes    Birth control/protection: None  Other Topics Concern   Not on file  Social History Narrative   Lives at home with wife & 3 children and a dog.    Married.  Wife has 2 children, so 3 in the household.  Exercise some with walking.  Works as Teacher, early years/pre for [employer].     Drinks  1-2 cups of caffeine daily   Right handed   10/2021       Social Drivers of Health   Financial Resource Strain: Medium Risk (08/03/2023)   Overall Financial Resource Strain (CARDIA)    Difficulty of Paying Living Expenses: Somewhat hard  Food Insecurity: Food Insecurity Present (08/03/2023)   Hunger Vital Sign    Worried About Running Out of Food in the Last Year: Sometimes true    Ran Out of Food in the Last Year: Never true  Transportation Needs: Unmet Transportation Needs (08/03/2023)   PRAPARE - Administrator, Civil Service (Medical): No    Lack of Transportation (Non-Medical): Yes  Physical Activity: Unknown (08/03/2023)   Exercise Vital Sign    Days of Exercise per Week: 0 days    Minutes of Exercise per Session: Not on file  Stress: Stress Concern Present (08/03/2023)   [employer] of Occupational Health - Occupational Stress Questionnaire    Feeling of Stress : Rather much  Social Connections: Moderately Integrated (08/03/2023)   Social Connection and Isolation Panel    Frequency of Communication with Friends and Family: Twice a week    Frequency of Social Gatherings with Friends and Family: Once a week  Attends Religious Services: 1 to 4 times per year    Active Member of Clubs or Organizations: No    Attends Engineer, structural: Not on file    Marital Status: Married  Catering manager Violence: Not on file   Family History  Problem Relation Age of Onset   Hypertension Mother    Kidney disease Father    Diabetes Maternal Aunt    Diabetes Paternal Aunt    Cancer Maternal Grandfather        throat   Stroke Paternal Grandmother    Alcohol abuse Paternal Grandmother    Heart disease Neg Hx       VITAL SIGNS BP (!) 148/80   Pulse 88   Temp 97.6 F (36.4 C)   Resp 20   Ht 5' 9 (1.753 m)   Wt (!) 330 lb 6.4 oz (149.9 kg)   SpO2 98%   BMI 48.79 kg/m   Outpatient Encounter Medications as of 05/11/2024  Medication Sig    atorvastatin  (LIPITOR) 10 MG tablet Take 1 tablet (10 mg total) by mouth daily.   Blood Glucose Monitoring Suppl DEVI 1 each by Does not apply route in the morning, at noon, and at bedtime. May substitute to any manufacturer covered by patient's insurance.   Blood Pressure KIT 1 each by Does not apply route daily.   colchicine  0.6 MG tablet Take 1.2mg  (2 tablets) then 0.6mg  (1 tablet) 1 hour after. Then, take 1 tablet every day for 7 days.   metoprolol  tartrate (LOPRESSOR ) 25 MG tablet Take 1 tablet (25 mg total) by mouth 2 (two) times daily.   spironolactone (ALDACTONE) 50 MG tablet Take 50 mg by mouth daily.   tadalafil  (CIALIS ) 5 MG tablet TAKE 1 TABLET BY MOUTH DAILY AS NEEDED FOR ERECTILE DYSFUNCTION.   valsartan  (DIOVAN ) 160 MG tablet TAKE 1 TABLET BY MOUTH EVERY DAY   VYVANSE 20 MG capsule Take 20 mg by mouth daily.   VYVANSE 50 MG capsule Take 50 mg by mouth daily.   Semaglutide ,0.25 or 0.5MG /DOS, (OZEMPIC , 0.25 OR 0.5 MG/DOSE,) 2 MG/3ML SOPN Inject 1 mg into the skin once a week. (Patient not taking: Reported on 05/11/2024)   Facility-Administered Encounter Medications as of 05/11/2024  Medication   cloNIDine  (CATAPRES ) tablet 0.1 mg     SIGNIFICANT DIAGNOSTIC EXAMS  Review of Systems  Constitutional:  Negative for malaise/fatigue.  Respiratory:  Negative for cough and shortness of breath.   Cardiovascular:  Positive for leg swelling. Negative for chest pain and palpitations.  Gastrointestinal:  Negative for abdominal pain, constipation and heartburn.  Musculoskeletal:  Negative for back pain, joint pain and myalgias.  Skin: Negative.   Neurological:  Negative for dizziness.  Psychiatric/Behavioral:  The patient is not nervous/anxious.    Physical Exam Constitutional:      General: He is not in acute distress.    Appearance: He is well-developed. He is morbidly obese. He is not diaphoretic.  Neck:     Thyroid: No thyromegaly.  Cardiovascular:     Rate and Rhythm: Normal rate  and regular rhythm.     Pulses: Normal pulses.     Heart sounds: Normal heart sounds.  Pulmonary:     Effort: Pulmonary effort is normal. No respiratory distress.     Breath sounds: Normal breath sounds.  Abdominal:     General: Bowel sounds are normal. There is no distension.     Palpations: Abdomen is soft.     Tenderness: There is no abdominal  tenderness.  Musculoskeletal:        General: Normal range of motion.     Cervical back: Neck supple.     Right lower leg: Edema present.     Left lower leg: Edema present.  Lymphadenopathy:     Cervical: No cervical adenopathy.  Skin:    General: Skin is warm and dry.  Neurological:     Mental Status: He is alert and oriented to person, place, and time.  Psychiatric:        Mood and Affect: Mood normal.      ASSESSMENT/ PLAN:  TODAY  Type 2 diabetes with obesity Hypertension associated with type 2 diabetes.   Continue current regimen Will need to return to clinic in 6 months for blood work and follow up.    Barnie Seip NP [facility]  call [phone]

## 2024-05-24 ENCOUNTER — Encounter (HOSPITAL_BASED_OUTPATIENT_CLINIC_OR_DEPARTMENT_OTHER): Attending: General Surgery | Admitting: General Surgery

## 2024-05-24 DIAGNOSIS — E11622 Type 2 diabetes mellitus with other skin ulcer: Secondary | ICD-10-CM | POA: Insufficient documentation

## 2024-05-24 DIAGNOSIS — L97212 Non-pressure chronic ulcer of right calf with fat layer exposed: Secondary | ICD-10-CM | POA: Diagnosis not present

## 2024-05-24 DIAGNOSIS — L97812 Non-pressure chronic ulcer of other part of right lower leg with fat layer exposed: Secondary | ICD-10-CM | POA: Insufficient documentation

## 2024-05-25 ENCOUNTER — Ambulatory Visit (INDEPENDENT_AMBULATORY_CARE_PROVIDER_SITE_OTHER): Admitting: Psychology

## 2024-05-25 DIAGNOSIS — F4323 Adjustment disorder with mixed anxiety and depressed mood: Secondary | ICD-10-CM

## 2024-05-25 NOTE — Progress Notes (Signed)
 [facility] Counselor/Therapist Progress Note  Patient ID: TAB RYLEE, MRN: [id number],    Date: 05/25/2024  Time Spent: 50 mins; start time: 0900; end time: 0950  Treatment Type: Individual Therapy  Reported Symptoms: Pt presents for session via Caregility video.  Pt grants consent for session, stating he is in his home with no one else present; pt also shares that he understands the limitations of virtual session.  I shared with pt that I am in my office with no one else here either.  Mental Status Exam: Appearance:  Casual     Behavior: Appropriate  Motor: Normal  Speech/Language:  Clear and Coherent  Affect: Appropriate  Mood: normal  Thought process: normal  Thought content:   WNL  Sensory/Perceptual disturbances:   WNL  Orientation: oriented to person, place, and time/date  Attention: Good  Concentration: Good  Memory: WNL  Fund of knowledge:  Good  Insight:   Good  Judgment:  Good  Impulse Control: Good   Risk Assessment: Danger to Self:  No Self-injurious Behavior: No Danger to Others: No Duty to Warn:no Physical Aggression / Violence:No  Access to Firearms a concern: No  Gang Involvement:No   Subjective: Pt shares that, I have talked with my son (in school in [place]) and he is enjoying his program.  He likes the hands on aspect of the program.  I think he is probably adjusting better than I am.  We talk regularly and that is good.  Pt shares they are not sure what they will be doing with their son for Thanksgiving, Christmas, and his birthday (Feb).  Pt and his wife celebrated their 10th anniversary in [place] last week.  Pt's oldest son was having some car trouble while pt was in [place] last week; they got it looked at and this morning, he had additional trouble with his car; they will pursue getting it fixed today.  Pt shares that he is back in wound care due to the development of another small wound on the same leg.  He is trying to maintain a positive attitude  and he hopes to heal well.  Pt shares that the wound care staff are not sure why these wounds are occurring; they have biopsied and cultured several areas for him but have not learned anything that would be helpful.  Pt's father is experiencing end stage renal disease and has maintained that status for the past 15 yrs.  Pt is followed by a kidney specialist as well; his kidney doctor routinely finds blood in his urine on his exams but they have never been able to determine where the blood is coming from.  I am a medical mystery, it seems.  Pt shares that, generally, his day to day health is pretty good.  Pt shares that work has been going better; he was able to get a raise, based on his performance over the past 4 consecutive months.  Pt shares that his parents' wedding anniversary is this Friday, 7/25, and it will be there 50th anniversary.  Pt's oldest son is still working at [employer] and is still dating his new girlfriend; he has also decided to go back to school; [employer] has a tuition assistance program that he plans to access.  His new girlfriend is encouraging him to get back in school; she already has a biology degree.  Pt continues work Sun, Mon, [place], and Fri and is off on Tues, Wed, and Sat.  Pt does not really enjoy his work but  it pays him well and he has good benefits.  He does not get his identity from work; pt has been with the company for 15 yrs.  Encouraged pt to be intentional about engaging in his self care activities and we will meet in 2 weeks for a follow up session.  Interventions: Cognitive Behavioral Therapy  Diagnosis:Adjustment disorder with mixed anxiety and depressed mood  Plan: Treatment Plan Strengths/Abilities:  Intelligent, Intuitive, Willing to participate in therapy Treatment Preferences:  Outpatient Individual Therapy Statement of Needs:  Patient is to use CBT, mindfulness and coping skills to help manage and/or decrease symptoms associated with their  diagnosis. Symptoms:  Depressed/Irritable mood, worry, social withdrawal Problems Addressed:  Depressive thoughts, Sadness, Sleep issues, etc. Long Term Goals:  Pt to reduce overall level, frequency, and intensity of the feelings of depression/anxiety as evidenced by decreased irritability, negative self talk, and helpless feelings from 6 to 7 days/week to 0 to 1 days/week, per client report, for at least 3 consecutive months.  Progress: 30% Short Term Goals:  Pt to verbally express understanding of the relationship between feelings of depression/anxiety and their impact on thinking patterns and behaviors.  Pt to verbalize an understanding of the role that distorted thinking plays in creating fears, excessive worry, and ruminations.  Progress: 30% Target Date:  04/24/2025 Frequency:  Bi-weekly Modality:  Cognitive Behavioral Therapy Interventions by Therapist:  Therapist will use CBT, Mindfulness exercises, Coping skills and Referrals, as needed by client. Client has verbally approved this treatment plan.  Francis KATHEE Macintosh, [facility]

## 2024-05-31 ENCOUNTER — Other Ambulatory Visit: Payer: Self-pay | Admitting: Family

## 2024-06-01 ENCOUNTER — Encounter (HOSPITAL_BASED_OUTPATIENT_CLINIC_OR_DEPARTMENT_OTHER): Admitting: General Surgery

## 2024-06-01 DIAGNOSIS — L97812 Non-pressure chronic ulcer of other part of right lower leg with fat layer exposed: Secondary | ICD-10-CM | POA: Diagnosis not present

## 2024-06-01 DIAGNOSIS — E11622 Type 2 diabetes mellitus with other skin ulcer: Secondary | ICD-10-CM | POA: Diagnosis not present

## 2024-06-07 ENCOUNTER — Encounter (HOSPITAL_BASED_OUTPATIENT_CLINIC_OR_DEPARTMENT_OTHER): Attending: General Surgery | Admitting: General Surgery

## 2024-06-07 DIAGNOSIS — E11622 Type 2 diabetes mellitus with other skin ulcer: Secondary | ICD-10-CM | POA: Diagnosis not present

## 2024-06-07 DIAGNOSIS — L97812 Non-pressure chronic ulcer of other part of right lower leg with fat layer exposed: Secondary | ICD-10-CM | POA: Insufficient documentation

## 2024-06-07 DIAGNOSIS — I872 Venous insufficiency (chronic) (peripheral): Secondary | ICD-10-CM | POA: Diagnosis not present

## 2024-06-08 ENCOUNTER — Ambulatory Visit: Admitting: Psychology

## 2024-06-08 DIAGNOSIS — F4323 Adjustment disorder with mixed anxiety and depressed mood: Secondary | ICD-10-CM | POA: Diagnosis not present

## 2024-06-08 NOTE — Progress Notes (Signed)
 [facility] Counselor/Therapist Progress Note  Patient ID: [name], MRN: [id number],    Date: 06/08/2024  Time Spent: 58 mins; start time: 1100; end time: 1158  Treatment Type: Individual Therapy  Reported Symptoms: Pt presents for session via Caregility video.  Pt grants consent for session, stating he is in his home with no one else present; pt also shares that he understands the limitations of virtual session.  I shared with pt that I am in my office with no one else here either.  Mental Status Exam: Appearance:  Casual     Behavior: Appropriate  Motor: Normal  Speech/Language:  Clear and Coherent  Affect: Appropriate  Mood: normal  Thought process: normal  Thought content:   WNL  Sensory/Perceptual disturbances:   WNL  Orientation: oriented to person, place, and time/date  Attention: Good  Concentration: Good  Memory: WNL  Fund of knowledge:  Good  Insight:   Good  Judgment:  Good  Impulse Control: Good   Risk Assessment: Danger to Self:  No Self-injurious Behavior: No Danger to Others: No Duty to Warn:no Physical Aggression / Violence:No  Access to Firearms a concern: No  Gang Involvement:No   Subjective: Pt shares that, I have been doing OK since our last session.  We are talking regularly with my son who is in school in [facility] and he is doing OK; he is doing well in his program and that makes me feel pretty good.  Pt shares he has again been thinking about what he wants his life to become in the near future.  He enjoyed his birthday and he is going to the beach on Saturday and will be there until Wednesday.  Pt shares that he and his wife are thinking about leasing their home to the boys and allowing them to stay in the home.  If the boys do not like that idea, they will sell it and move to a home that his parents own in [place].  He has been thinking more about does he want to continue with his current job or does he want to look for something new.  He has  been with his current employer for almost 15 yrs and he is earning a significant amount of time off and he enjoys being able to work from home.  If he changes jobs within [employer], he would have to move back to the office.  He is not sure what industry that he would want to pursue.  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  Pt shares his motivations for life are internal to him and he does not get his sense of value from work.  Pt shares that his sister has suggested that pursue other interests since he does not have to invest as much of his time into his children; for example, he used to write quite a bit and he was able to publish two poems and a short story.  Pt shares that he just went back to the wound care specialist and she told him that his wound is healing and he feels good about that.  Pt continues work Sun, Mon, [place], and Fri and is off on Tues, Wed, and Sat.  Encouraged pt to be intentional about engaging in his self care activities and we will meet in 3 weeks for a follow up session.  Interventions: Cognitive Behavioral Therapy  Diagnosis:Adjustment disorder with mixed anxiety and depressed mood  Plan: Treatment Plan Strengths/Abilities:  Intelligent, Intuitive, Willing to participate in therapy Treatment Preferences:  Outpatient Individual Therapy Statement of Needs:  Patient is to use CBT, mindfulness and coping skills to help manage and/or decrease symptoms associated with their diagnosis. Symptoms:  Depressed/Irritable mood, worry, social withdrawal Problems Addressed:  Depressive thoughts, Sadness, Sleep issues, etc. Long Term Goals:  Pt to reduce overall level, frequency, and intensity of the feelings of depression/anxiety as evidenced by decreased irritability, negative self talk, and helpless feelings from 6 to 7 days/week to 0 to 1 days/week, per client report, for at least 3 consecutive months.  Progress: 30% Short Term Goals:  Pt to  verbally express understanding of the relationship between feelings of depression/anxiety and their impact on thinking patterns and behaviors.  Pt to verbalize an understanding of the role that distorted thinking plays in creating fears, excessive worry, and ruminations.  Progress: 30% Target Date:  04/24/2025 Frequency:  Bi-weekly Modality:  Cognitive Behavioral Therapy Interventions by Therapist:  Therapist will use CBT, Mindfulness exercises, Coping skills and Referrals, as needed by client. Client has verbally approved this treatment plan.  Francis KATHEE Macintosh, [facility]

## 2024-06-16 ENCOUNTER — Encounter (HOSPITAL_BASED_OUTPATIENT_CLINIC_OR_DEPARTMENT_OTHER): Admitting: General Surgery

## 2024-06-16 DIAGNOSIS — L97812 Non-pressure chronic ulcer of other part of right lower leg with fat layer exposed: Secondary | ICD-10-CM | POA: Diagnosis not present

## 2024-06-16 DIAGNOSIS — E11622 Type 2 diabetes mellitus with other skin ulcer: Secondary | ICD-10-CM | POA: Diagnosis not present

## 2024-06-21 ENCOUNTER — Encounter (HOSPITAL_BASED_OUTPATIENT_CLINIC_OR_DEPARTMENT_OTHER): Admitting: General Surgery

## 2024-06-21 DIAGNOSIS — L97812 Non-pressure chronic ulcer of other part of right lower leg with fat layer exposed: Secondary | ICD-10-CM | POA: Diagnosis not present

## 2024-06-21 DIAGNOSIS — E11622 Type 2 diabetes mellitus with other skin ulcer: Secondary | ICD-10-CM | POA: Diagnosis not present

## 2024-06-21 DIAGNOSIS — I872 Venous insufficiency (chronic) (peripheral): Secondary | ICD-10-CM | POA: Diagnosis not present

## 2024-06-22 ENCOUNTER — Ambulatory Visit (HOSPITAL_BASED_OUTPATIENT_CLINIC_OR_DEPARTMENT_OTHER): Admitting: General Surgery

## 2024-06-29 ENCOUNTER — Encounter (HOSPITAL_BASED_OUTPATIENT_CLINIC_OR_DEPARTMENT_OTHER): Admitting: General Surgery

## 2024-06-29 ENCOUNTER — Ambulatory Visit (INDEPENDENT_AMBULATORY_CARE_PROVIDER_SITE_OTHER): Admitting: Psychology

## 2024-06-29 DIAGNOSIS — F4323 Adjustment disorder with mixed anxiety and depressed mood: Secondary | ICD-10-CM

## 2024-06-29 DIAGNOSIS — E11622 Type 2 diabetes mellitus with other skin ulcer: Secondary | ICD-10-CM | POA: Diagnosis not present

## 2024-06-29 DIAGNOSIS — I872 Venous insufficiency (chronic) (peripheral): Secondary | ICD-10-CM | POA: Diagnosis not present

## 2024-06-29 DIAGNOSIS — L97812 Non-pressure chronic ulcer of other part of right lower leg with fat layer exposed: Secondary | ICD-10-CM | POA: Diagnosis not present

## 2024-06-29 NOTE — Progress Notes (Signed)
 [facility] Counselor/Therapist Progress Note  Patient ID: [name], MRN: [id number],    Date: 06/29/2024  Time Spent: 50 mins; start time: 0900; end time: 0950  Treatment Type: Individual Therapy  Reported Symptoms: Pt presents for session via Caregility video.  Pt grants consent for session, stating he is in his home with no one else present; pt also shares that he understands the limitations of virtual session.  I shared with pt that I am in my office with no one else here either.  Mental Status Exam: Appearance:  Casual     Behavior: Appropriate  Motor: Normal  Speech/Language:  Clear and Coherent  Affect: Appropriate  Mood: normal  Thought process: normal  Thought content:   WNL  Sensory/Perceptual disturbances:   WNL  Orientation: oriented to person, place, and time/date  Attention: Good  Concentration: Good  Memory: WNL  Fund of knowledge:  Good  Insight:   Good  Judgment:  Good  Impulse Control: Good   Risk Assessment: Danger to Self:  No Self-injurious Behavior: No Danger to Others: No Duty to Warn:no Physical Aggression / Violence:No  Access to Firearms a concern: No  Gang Involvement:No   Subjective: Pt shares that, I have been doing OK since our last session.  [name] is the biggest part of my stress right now, trying to get my leave arranged.  My wife went back to work two weeks ago and that is going well; my youngest son went back to school on Monday.  He has told me he definitely wants to get started on his college applications because he wants to have his plans in place as soon as possible.  He is narrowing down his list of schools and we will be touring schools in October.  Pt shares his middle son is doing well in [facility] in his program there; they have had several job fairs so far and he understands that he will have lots of choices in his career search.  His oldest son is spending more time at his girlfriend's than at pt's home.  Pt shares  that he is doing well at work; he has earned his level up goal and will do that training next week and will get his raise next week and he won an award last week.  Pt is glad that work is going as well as it is going for him right now.  Pt shares his mom and dad are doing well; his dad is still working at 25 yo and his mom just bought her a new car and has recovered well from her breast cancer surgery.  Pt has an appt with the [facility] center after our session today and he hopes that he will get good news from them.  Pt continues work Sun, Mon, [place], and Fri and is off on Tues, Wed, and Sat.  Encouraged pt to be intentional about engaging in his self care activities and we will meet in 3 weeks for a follow up session.  Interventions: Cognitive Behavioral Therapy  Diagnosis:Adjustment disorder with mixed anxiety and depressed mood  Plan: Treatment Plan Strengths/Abilities:  Intelligent, Intuitive, Willing to participate in therapy Treatment Preferences:  Outpatient Individual Therapy Statement of Needs:  Patient is to use CBT, mindfulness and coping skills to help manage and/or decrease symptoms associated with their diagnosis. Symptoms:  Depressed/Irritable mood, worry, social withdrawal Problems Addressed:  Depressive thoughts, Sadness, Sleep issues, etc. Long Term Goals:  Pt to reduce overall level, frequency, and intensity  of the feelings of depression/anxiety as evidenced by decreased irritability, negative self talk, and helpless feelings from 6 to 7 days/week to 0 to 1 days/week, per client report, for at least 3 consecutive months.  Progress: 30% Short Term Goals:  Pt to verbally express understanding of the relationship between feelings of depression/anxiety and their impact on thinking patterns and behaviors.  Pt to verbalize an understanding of the role that distorted thinking plays in creating fears, excessive worry, and ruminations.  Progress: 30% Target Date:  04/24/2025 Frequency:   Bi-weekly Modality:  Cognitive Behavioral Therapy Interventions by Therapist:  Therapist will use CBT, Mindfulness exercises, Coping skills and Referrals, as needed by client. Client has verbally approved this treatment plan.  Francis KATHEE Macintosh, [facility]

## 2024-07-06 ENCOUNTER — Encounter (HOSPITAL_BASED_OUTPATIENT_CLINIC_OR_DEPARTMENT_OTHER): Attending: General Surgery | Admitting: General Surgery

## 2024-07-06 DIAGNOSIS — E11622 Type 2 diabetes mellitus with other skin ulcer: Secondary | ICD-10-CM | POA: Insufficient documentation

## 2024-07-06 DIAGNOSIS — L97812 Non-pressure chronic ulcer of other part of right lower leg with fat layer exposed: Secondary | ICD-10-CM | POA: Diagnosis not present

## 2024-07-06 DIAGNOSIS — I872 Venous insufficiency (chronic) (peripheral): Secondary | ICD-10-CM | POA: Diagnosis not present

## 2024-07-13 ENCOUNTER — Ambulatory Visit (HOSPITAL_BASED_OUTPATIENT_CLINIC_OR_DEPARTMENT_OTHER): Admitting: General Surgery

## 2024-07-19 ENCOUNTER — Telehealth: Payer: Self-pay

## 2024-07-19 NOTE — Telephone Encounter (Signed)
 This is an obvious pharmacy error and will be disregarded

## 2024-07-19 NOTE — Telephone Encounter (Signed)
 Incoming fax received from [facility] stating  Your patient is requesting to lower the cost of their medications by switching to a new prescription. Please sign or e-prescribe the new prescription to the pharmacy below:  Current medication: Tadalafil  5 mg New request for : Doxazosin 1 mg   Pharmacy: [facility]                     [address]                    [place], [id number]

## 2024-07-19 NOTE — Telephone Encounter (Signed)
 Please clarify with pharmacy patient is taking Tadalafil  ( Cialis ) 5 mg for ED but recommended new medication Doxazosin is for BPH or HTN.

## 2024-07-20 ENCOUNTER — Ambulatory Visit (INDEPENDENT_AMBULATORY_CARE_PROVIDER_SITE_OTHER): Admitting: Psychology

## 2024-07-20 DIAGNOSIS — F4323 Adjustment disorder with mixed anxiety and depressed mood: Secondary | ICD-10-CM

## 2024-07-20 NOTE — Progress Notes (Signed)
 [facility] Counselor/Therapist Progress Note  Patient ID: NOWELL SITES, MRN: [id number],    Date: 07/20/2024  Time Spent: 60 mins; start time: 1000; end time: 1100  Treatment Type: Individual Therapy  Reported Symptoms: Pt presents for session via Caregility video.  Pt grants consent for session, stating he is in his home with no one else present; pt also shares that he understands the limitations of virtual session.  I shared with pt that I am in my office with no one else here either.  Mental Status Exam: Appearance:  Casual     Behavior: Appropriate  Motor: Normal  Speech/Language:  Clear and Coherent  Affect: Appropriate  Mood: normal  Thought process: normal  Thought content:   WNL  Sensory/Perceptual disturbances:   WNL  Orientation: oriented to person, place, and time/date  Attention: Good  Concentration: Good  Memory: WNL  Fund of knowledge:  Good  Insight:   Good  Judgment:  Good  Impulse Control: Good   Risk Assessment: Danger to Self:  No Self-injurious Behavior: No Danger to Others: No Duty to Warn:no Physical Aggression / Violence:No  Access to Firearms a concern: No  Gang Involvement:No   Subjective: Pt shares that, I have been doing pretty good since our last session.  I have had to kick myself a bit though; I was working hard to get to the Rep 2 level at work and then I was not doing the things I needed to do to stay there.  I was not working as hard at maintaining the level as I had worked to obtain the level.  Pt shares that he is feeling better about his performance since he has been paying attention to it.  Pt shares that [name] has said that they have everything that they need but he plans to follow up with them today just to be sure everything is in place.  Pt shares that his son who is in school in [facility], has a break in his schedule this weekend and would like to come home for a few days; they are trying to find a flight for him to get  home for the week.  Pt is looking forward to seeing him as are the other family members.  Pt shares that he has been discharged from [facility] and he is happy about that; he is going to work hard to stay healthy and avoid further concerns.  His youngest son has taken his first college tour to [employer] this week and enjoyed the visit.  He is going to continue with his tours throughout the Fall.  Pt shares that his oldest son is spending more time at his girlfriend's than at pt's home.  His son is now working full time at [employer] and pt has encouraged him to be intentional about contributing to his 15k and it has been a hard sell for his son.  Pt shares his mom and dad are doing well; his mom just celebrated her 69th birthday; his dad is still working at 40 yo.  His dad would like to take an [place] cruise and they are talking about how to plan that, given his dad's age.  Pt continues work Sun, Mon, [place], and Fri and is off on Tues, Wed, and Sat.  Encouraged pt to be intentional about engaging in his self care activities and we will meet in 3 weeks for a follow up session.  Interventions: Cognitive Behavioral Therapy  Diagnosis:Adjustment disorder with mixed anxiety and depressed  mood  Plan: Treatment Plan Strengths/Abilities:  Intelligent, Intuitive, Willing to participate in therapy Treatment Preferences:  Outpatient Individual Therapy Statement of Needs:  Patient is to use CBT, mindfulness and coping skills to help manage and/or decrease symptoms associated with their diagnosis. Symptoms:  Depressed/Irritable mood, worry, social withdrawal Problems Addressed:  Depressive thoughts, Sadness, Sleep issues, etc. Long Term Goals:  Pt to reduce overall level, frequency, and intensity of the feelings of depression/anxiety as evidenced by decreased irritability, negative self talk, and helpless feelings from 6 to 7 days/week to 0 to 1 days/week, per client report, for at least 3 consecutive months.  Progress:  30% Short Term Goals:  Pt to verbally express understanding of the relationship between feelings of depression/anxiety and their impact on thinking patterns and behaviors.  Pt to verbalize an understanding of the role that distorted thinking plays in creating fears, excessive worry, and ruminations.  Progress: 30% Target Date:  04/24/2025 Frequency:  Bi-weekly Modality:  Cognitive Behavioral Therapy Interventions by Therapist:  Therapist will use CBT, Mindfulness exercises, Coping skills and Referrals, as needed by client. Client has verbally approved this treatment plan.  Francis KATHEE Macintosh, [facility]

## 2024-07-24 ENCOUNTER — Other Ambulatory Visit: Payer: Self-pay | Admitting: Family

## 2024-07-25 ENCOUNTER — Other Ambulatory Visit: Payer: Self-pay | Admitting: Family

## 2024-07-25 DIAGNOSIS — E1169 Type 2 diabetes mellitus with other specified complication: Secondary | ICD-10-CM

## 2024-07-26 NOTE — Telephone Encounter (Signed)
 Noted

## 2024-07-26 NOTE — Telephone Encounter (Signed)
 Message routed to PCP [name], Roxan BROCKS, NP . Please clarify which medication you recommend.

## 2024-07-26 NOTE — Telephone Encounter (Signed)
 Paper work was already completed and placed for faxing.Patient to continue on Tadalafil  5 mg tablet daily as needed for ED.

## 2024-07-26 NOTE — Telephone Encounter (Signed)
 Prescription clarification request from pharmacy

## 2024-07-26 NOTE — Telephone Encounter (Signed)
 Copied from CRM #[id number]. Topic: Clinical - Medication Question >> Jul 26, 2024 12:00 PM Suzette B wrote: Reason for CRM: Gracie from [facility] has called in regards to the patient's medication Tadalafil  5mg  and/or the Doxazosin lmg. Gracie stated that a fax was sent over on the 16th of September in regards to the Tadalafil ; however she is needing clarification on which medication the provider has recommended for the patient. Please call gracie at [phone]

## 2024-07-30 ENCOUNTER — Other Ambulatory Visit: Payer: Self-pay | Admitting: Family

## 2024-07-30 DIAGNOSIS — I10 Essential (primary) hypertension: Secondary | ICD-10-CM

## 2024-07-30 DIAGNOSIS — E1169 Type 2 diabetes mellitus with other specified complication: Secondary | ICD-10-CM

## 2024-08-02 ENCOUNTER — Ambulatory Visit (INDEPENDENT_AMBULATORY_CARE_PROVIDER_SITE_OTHER): Admitting: Psychology

## 2024-08-02 DIAGNOSIS — F4323 Adjustment disorder with mixed anxiety and depressed mood: Secondary | ICD-10-CM

## 2024-08-02 NOTE — Progress Notes (Signed)
 [facility] Counselor/Therapist Progress Note  Patient ID: [name], MRN: [id number],    Date: 08/02/2024  Time Spent: 55 mins; start time: 1000; end time: 1055  Treatment Type: Individual Therapy  Reported Symptoms: Pt presents for session via Caregility video.  Pt grants consent for session, stating he is in his home with no one else present; pt also shares that he understands the limitations of virtual session.  I shared with pt that I am in my office with no one else here either.  Mental Status Exam: Appearance:  Casual     Behavior: Appropriate  Motor: Normal  Speech/Language:  Clear and Coherent  Affect: Appropriate  Mood: normal  Thought process: normal  Thought content:   WNL  Sensory/Perceptual disturbances:   WNL  Orientation: oriented to person, place, and time/date  Attention: Good  Concentration: Good  Memory: WNL  Fund of knowledge:  Good  Insight:   Good  Judgment:  Good  Impulse Control: Good   Risk Assessment: Danger to Self:  No Self-injurious Behavior: No Danger to Others: No Duty to Warn:no Physical Aggression / Violence:No  Access to Firearms a concern: No  Gang Involvement:No   Subjective: Pt shares that, My son who is in school in [place] came home for a break from school.  It was good to see him.  He shared with us  that he had the highest exam score in the school and he felt great about that.  He has talked with lots of businesses and people about future jobs and he feels good about his future employment opportunities.  Pt shares that he is proud that his son is working so hard at school.  The family is not sure what they are going to do about the holidays with him because he does not have as many days off during that period.  Pt is hopeful that he can come home during that time again.  Pt shares that work is going fine.  Pt shares his family is doing well; parents and kids are doing well.  Pt shares that his ankle continues to be doing  well and he no longer has any wounds that need to be addressed.  His youngest son is continuing his college tours; he has been to [employer] and is going to [facility] in a couple of weeks.  Pt's wife continues to do well as well; she is encountering several difficult student issues that she is working through.  Pt shares that his oldest son is now working full time for [employer] and has benefits through them now; he continues to live with his girlfriend.  Pt is working with his son to help his son make good choices with his money from [employer] and is trying to help him make more grown up decisions in other areas as well.  Pt continues work Sun, Mon, [place], and Fri and is off on Tues, Wed, and Sat.  Encouraged pt to be intentional about engaging in his self care activities and we will meet in 3 weeks for a follow up session.  Interventions: Cognitive Behavioral Therapy  Diagnosis:Adjustment disorder with mixed anxiety and depressed mood  Plan: Treatment Plan Strengths/Abilities:  Intelligent, Intuitive, Willing to participate in therapy Treatment Preferences:  Outpatient Individual Therapy Statement of Needs:  Patient is to use CBT, mindfulness and coping skills to help manage and/or decrease symptoms associated with their diagnosis. Symptoms:  Depressed/Irritable mood, worry, social withdrawal Problems Addressed:  Depressive thoughts, Sadness, Sleep issues, etc.  Long Term Goals:  Pt to reduce overall level, frequency, and intensity of the feelings of depression/anxiety as evidenced by decreased irritability, negative self talk, and helpless feelings from 6 to 7 days/week to 0 to 1 days/week, per client report, for at least 3 consecutive months.  Progress: 30% Short Term Goals:  Pt to verbally express understanding of the relationship between feelings of depression/anxiety and their impact on thinking patterns and behaviors.  Pt to verbalize an understanding of the role that distorted thinking plays in  creating fears, excessive worry, and ruminations.  Progress: 30% Target Date:  04/24/2025 Frequency:  Bi-weekly Modality:  Cognitive Behavioral Therapy Interventions by Therapist:  Therapist will use CBT, Mindfulness exercises, Coping skills and Referrals, as needed by client. Client has verbally approved this treatment plan.  Francis KATHEE Macintosh, [facility]

## 2024-08-03 NOTE — Progress Notes (Addendum)
 [name]                                          MRN: [id number]   08/03/2024   The VBCI Quality Team Specialist reviewed this patient medical record for the purposes of chart review for care gap closure. The following were reviewed: chart review for care gap closure-controlling blood pressure.DIABETIC EYE EXAM    VBCI Quality Team

## 2024-08-17 ENCOUNTER — Ambulatory Visit (INDEPENDENT_AMBULATORY_CARE_PROVIDER_SITE_OTHER): Admitting: Psychology

## 2024-08-17 DIAGNOSIS — F4323 Adjustment disorder with mixed anxiety and depressed mood: Secondary | ICD-10-CM | POA: Diagnosis not present

## 2024-08-17 NOTE — Progress Notes (Signed)
 [facility] Counselor/Therapist Progress Note  Patient ID: [name], MRN: [id number],    Date: 08/17/2024  Time Spent: 30 mins; start time: 0907; end time: 0937  Treatment Type: Individual Therapy  Reported Symptoms: Pt presents for session via Caregility video.  Pt grants consent for session, stating he is in his home with no one else present; pt also shares that he understands the limitations of virtual session.  I shared with pt that I am in my office with no one else here either.  Mental Status Exam: Appearance:  Casual     Behavior: Appropriate  Motor: Normal  Speech/Language:  Clear and Coherent  Affect: Appropriate  Mood: normal  Thought process: normal  Thought content:   WNL  Sensory/Perceptual disturbances:   WNL  Orientation: oriented to person, place, and time/date  Attention: Good  Concentration: Good  Memory: WNL  Fund of knowledge:  Good  Insight:   Good  Judgment:  Good  Impulse Control: Good   Risk Assessment: Danger to Self:  No Self-injurious Behavior: No Danger to Others: No Duty to Warn:no Physical Aggression / Violence:No  Access to Firearms a concern: No  Gang Involvement:No   Subjective: Pt shares that, I have been doing pretty well since our last session.  Everything is going pretty well; the kids are all doing pretty well.  I had not heard from my son in [place] until yesterday and we talked on the phone for about 45 mins; he is doing really well.  Pt shares his oldest son is still working at [employer] and is enjoying that pretty well.  His youngest son is still visiting colleges, having been to [employer] earlier and went to [place] since our last session.  He has a few more visits scheduled.  Pt shares that work is continuing to work from home and enjoys that; work is work and that is about it.  Pt shares that work is stressful; he and his wife are doing more events in the evenings since two of the kids are out of the home.  Pt shares  that his parents are doing pretty well; his mom continues to recovery from her cancer surgery and is doing well with that.  Pt shares his dad (46 yo) continues to work and enjoys the routine of work.  Pt shares that his ankle continues to be doing well and he no longer has any wounds that need to be addressed; he is still waiting for his prescription ankle brace and is using an OTC one unti then.  Pt shares that his wife is doing well; she enjoys Fall and the holidays.  Pt continues work Sun, Mon, [place], and Fri and is off on Tues, Wed, and Sat.  Encouraged pt to be intentional about engaging in his self care activities and we will meet in 2 weeks for a follow up session.  Interventions: Cognitive Behavioral Therapy  Diagnosis:Adjustment disorder with mixed anxiety and depressed mood  Plan: Treatment Plan Strengths/Abilities:  Intelligent, Intuitive, Willing to participate in therapy Treatment Preferences:  Outpatient Individual Therapy Statement of Needs:  Patient is to use CBT, mindfulness and coping skills to help manage and/or decrease symptoms associated with their diagnosis. Symptoms:  Depressed/Irritable mood, worry, social withdrawal Problems Addressed:  Depressive thoughts, Sadness, Sleep issues, etc. Long Term Goals:  Pt to reduce overall level, frequency, and intensity of the feelings of depression/anxiety as evidenced by decreased irritability, negative self talk, and helpless feelings from 6 to 7 days/week to  0 to 1 days/week, per client report, for at least 3 consecutive months.  Progress: 30% Short Term Goals:  Pt to verbally express understanding of the relationship between feelings of depression/anxiety and their impact on thinking patterns and behaviors.  Pt to verbalize an understanding of the role that distorted thinking plays in creating fears, excessive worry, and ruminations.  Progress: 30% Target Date:  04/24/2025 Frequency:  Bi-weekly Modality:  Cognitive Behavioral  Therapy Interventions by Therapist:  Therapist will use CBT, Mindfulness exercises, Coping skills and Referrals, as needed by client. Client has verbally approved this treatment plan.  Francis KATHEE Macintosh, [facility]

## 2024-08-31 ENCOUNTER — Ambulatory Visit: Admitting: Psychology

## 2024-08-31 DIAGNOSIS — F4323 Adjustment disorder with mixed anxiety and depressed mood: Secondary | ICD-10-CM | POA: Diagnosis not present

## 2024-08-31 NOTE — Progress Notes (Signed)
 [facility] Counselor/Therapist Progress Note  Patient ID: SLOANE JUNKIN, MRN: [id number],    Date: 08/31/2024  Time Spent: 55 mins; start time: 1000; end time: 1055  Treatment Type: Individual Therapy  Reported Symptoms: Pt presents for session via Caregility video.  Pt grants consent for session, stating he is in his home with no one else present; pt also shares that he understands the limitations of virtual session.  I shared with pt that I am in my office with no one else here either.  Mental Status Exam: Appearance:  Casual     Behavior: Appropriate  Motor: Normal  Speech/Language:  Clear and Coherent  Affect: Appropriate  Mood: normal  Thought process: normal  Thought content:   WNL  Sensory/Perceptual disturbances:   WNL  Orientation: oriented to person, place, and time/date  Attention: Good  Concentration: Good  Memory: WNL  Fund of knowledge:  Good  Insight:   Good  Judgment:  Good  Impulse Control: Good   Risk Assessment: Danger to Self:  No Self-injurious Behavior: No Danger to Others: No Duty to Warn:no Physical Aggression / Violence:No  Access to Firearms a concern: No  Gang Involvement:No   Subjective: Pt shares that, I have been doing pretty well since our last session.  My dad celebrated his 49nd birthday last weekend and we had family members come to celebrate him.  My mom had her 6 month follow up for her breast cancer treatment and those results were good so she does not have to go back for a full year so that is good.  Pt shares that he watched a little too much news yesterday; it was overwhelming for me.  Pt shares his son in [place] called him yesterday to tell pt he was having a bad day; he had gotten a ding from the new RA on their hall for something in his room.  He had a hard day at school as well and one of his best friends at school is having to leave school because she can't afford to stay.  Pt shares his oldest son is still working at  [employer] and is enjoying that pretty well.  Pt shares that his oldest son is still living with his girlfriend and the girlfriend has a 22 yo son and pt's son loves being with the son.  They have gotten to the end of the honeymoon stage of their relationship and now they are at the part of the relationship that requires work.  Pt shares that his youngest son is finishing his senior year and he seems to be leaning toward enrolling in [employer]; pt and his wife are supportive of that decision.  Pt shares that his wife is doing fine at work; work is stressful for her with everything that is going on in [place]  at this time.  Pt shares that work for him is going pretty well.  His statistics continue to be positive and he is meeting his goals set for him.  Pt shares that his ankle continues to be doing well and he no longer has any wounds that need to be addressed; he is waiting for a new brace that is made for him and it should come for him in Dec; he should them be able to assume full activities at that point.  Pt continues work Sun, Mon, [place], and Fri and is off on Tues, Wed, and Sat.  Pt reports that he is sleeping OK of late.  Encouraged pt to be  intentional about engaging in his self care activities and we will meet in 2 weeks for a follow up session.  Interventions: Cognitive Behavioral Therapy  Diagnosis:Adjustment disorder with mixed anxiety and depressed mood  Plan: Treatment Plan Strengths/Abilities:  Intelligent, Intuitive, Willing to participate in therapy Treatment Preferences:  Outpatient Individual Therapy Statement of Needs:  Patient is to use CBT, mindfulness and coping skills to help manage and/or decrease symptoms associated with their diagnosis. Symptoms:  Depressed/Irritable mood, worry, social withdrawal Problems Addressed:  Depressive thoughts, Sadness, Sleep issues, etc. Long Term Goals:  Pt to reduce overall level, frequency, and intensity of the feelings of depression/anxiety as  evidenced by decreased irritability, negative self talk, and helpless feelings from 6 to 7 days/week to 0 to 1 days/week, per client report, for at least 3 consecutive months.  Progress: 30% Short Term Goals:  Pt to verbally express understanding of the relationship between feelings of depression/anxiety and their impact on thinking patterns and behaviors.  Pt to verbalize an understanding of the role that distorted thinking plays in creating fears, excessive worry, and ruminations.  Progress: 30% Target Date:  04/24/2025 Frequency:  Bi-weekly Modality:  Cognitive Behavioral Therapy Interventions by Therapist:  Therapist will use CBT, Mindfulness exercises, Coping skills and Referrals, as needed by client. Client has verbally approved this treatment plan.  Francis KATHEE Macintosh, [facility]

## 2024-09-14 ENCOUNTER — Ambulatory Visit (INDEPENDENT_AMBULATORY_CARE_PROVIDER_SITE_OTHER): Admitting: Family

## 2024-09-14 VITALS — BP 138/88 | HR 89 | Temp 98.2°F | Resp 19 | Ht 69.0 in | Wt 329.3 lb

## 2024-09-14 DIAGNOSIS — E669 Obesity, unspecified: Secondary | ICD-10-CM

## 2024-09-14 DIAGNOSIS — G4733 Obstructive sleep apnea (adult) (pediatric): Secondary | ICD-10-CM

## 2024-09-14 DIAGNOSIS — E119 Type 2 diabetes mellitus without complications: Secondary | ICD-10-CM

## 2024-09-14 DIAGNOSIS — E785 Hyperlipidemia, unspecified: Secondary | ICD-10-CM

## 2024-09-14 DIAGNOSIS — Z7985 Long-term (current) use of injectable non-insulin antidiabetic drugs: Secondary | ICD-10-CM

## 2024-09-14 DIAGNOSIS — I10 Essential (primary) hypertension: Secondary | ICD-10-CM | POA: Diagnosis not present

## 2024-09-14 DIAGNOSIS — L602 Onychogryphosis: Secondary | ICD-10-CM

## 2024-09-16 ENCOUNTER — Ambulatory Visit (INDEPENDENT_AMBULATORY_CARE_PROVIDER_SITE_OTHER): Admitting: Psychology

## 2024-09-16 DIAGNOSIS — F4323 Adjustment disorder with mixed anxiety and depressed mood: Secondary | ICD-10-CM

## 2024-09-16 LAB — MICROALBUMIN / CREATININE URINE RATIO
Creatinine, Urine: 299 mg/dL (ref 20–320)
Microalb Creat Ratio: 208 mg/g{creat} — ABNORMAL HIGH (ref <30)
Microalb, Ur: 62.3 mg/dL

## 2024-09-16 NOTE — Progress Notes (Signed)
 [facility] Counselor/Therapist Progress Note  Patient ID: [name], MRN: [id number],    Date: 09/16/2024  Time Spent: 30 mins; start time: 1400; end time: 1430  Treatment Type: Individual Therapy  Reported Symptoms: Pt presents for session via Caregility video.  Pt grants consent for session, stating he is in his home with no one else present; pt also shares that he understands the limitations of virtual session.  I shared with pt that I am in my office with no one else here either.  Mental Status Exam: Appearance:  Casual     Behavior: Appropriate  Motor: Normal  Speech/Language:  Clear and Coherent  Affect: Appropriate  Mood: normal  Thought process: normal  Thought content:   WNL  Sensory/Perceptual disturbances:   WNL  Orientation: oriented to person, place, and time/date  Attention: Good  Concentration: Good  Memory: WNL  Fund of knowledge:  Good  Insight:   Good  Judgment:  Good  Impulse Control: Good   Risk Assessment: Danger to Self:  No Self-injurious Behavior: No Danger to Others: No Duty to Warn:no Physical Aggression / Violence:No  Access to Firearms a concern: No  Gang Involvement:No   Subjective: Pt shares that, I have been doing pretty well since our last session.  My youngest son has been accepted at [employer] for next Fall and we are all excited for him.  My middle son is not going to be able to come home for Thanksgiving and is not likely to be able to come home for Christmas either; we are bummed about that.  They continue to have job fairs at the school and he has gotten a couple of offers for jobs after school.  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  Pt shares that his wife put their Christmas tree up right after Halloween.  Pt is planning to do some cooking with his mom for Thanksgiving.  Pt does have to work part of Thanksgiving day (830a-130p).  Pt shares his oldest son is still working at [employer] and  is enjoying that pretty well.  Pt shares that his oldest son is still living with his girlfriend and the girlfriend has a 83 yo son and pt's son loves being with the son.  Pt shares that work for him is going pretty well.  His statistics continue to be positive and he is meeting his goals set for him.  He was able to level up from Rep 1 to Rep 2.  He will pursue the Rep 3 next year.  Pt shares that his ankle continues to be doing well; he is waiting for a new brace that is made for him and it should come for him in early Dec.  Pt continues work Sun, Mon, [place], and Fri and is off on Tues, Wed, and Sat.  Pt reports that he is sleeping OK of late.  Encouraged pt to be intentional about engaging in his self care activities and we will meet in 4 weeks for a follow up session, due to Thanksgiving and pt's travel plans the next week.  Interventions: Cognitive Behavioral Therapy  Diagnosis:Adjustment disorder with mixed anxiety and depressed mood  Plan: Treatment Plan Strengths/Abilities:  Intelligent, Intuitive, Willing to participate in therapy Treatment Preferences:  Outpatient Individual Therapy Statement of Needs:  Patient is to use CBT, mindfulness and coping skills to help manage and/or decrease symptoms associated with their diagnosis. Symptoms:  Depressed/Irritable mood, worry, social  withdrawal Problems Addressed:  Depressive thoughts, Sadness, Sleep issues, etc. Long Term Goals:  Pt to reduce overall level, frequency, and intensity of the feelings of depression/anxiety as evidenced by decreased irritability, negative self talk, and helpless feelings from 6 to 7 days/week to 0 to 1 days/week, per client report, for at least 3 consecutive months.  Progress: 30% Short Term Goals:  Pt to verbally express understanding of the relationship between feelings of depression/anxiety and their impact on thinking patterns and behaviors.  Pt to verbalize an understanding of the role that distorted thinking plays  in creating fears, excessive worry, and ruminations.  Progress: 30% Target Date:  04/24/2025 Frequency:  Bi-weekly Modality:  Cognitive Behavioral Therapy Interventions by Therapist:  Therapist will use CBT, Mindfulness exercises, Coping skills and Referrals, as needed by client. Client has verbally approved this treatment plan.  Francis KATHEE Macintosh, [facility]

## 2024-09-20 ENCOUNTER — Other Ambulatory Visit

## 2024-09-20 ENCOUNTER — Ambulatory Visit: Payer: Self-pay | Admitting: Family

## 2024-09-20 DIAGNOSIS — E785 Hyperlipidemia, unspecified: Secondary | ICD-10-CM

## 2024-09-20 DIAGNOSIS — I10 Essential (primary) hypertension: Secondary | ICD-10-CM | POA: Diagnosis not present

## 2024-09-20 DIAGNOSIS — E669 Obesity, unspecified: Secondary | ICD-10-CM

## 2024-09-21 LAB — COMPLETE METABOLIC PANEL WITHOUT GFR
AG Ratio: 2 (calc) (ref 1.0–2.5)
ALT: 18 U/L (ref 9–46)
AST: 16 U/L (ref 10–40)
Albumin: 4.4 g/dL (ref 3.6–5.1)
Alkaline phosphatase (APISO): 50 U/L (ref 36–130)
BUN: 17 mg/dL (ref 7–25)
CO2: 26 mmol/L (ref 20–32)
Calcium: 9.3 mg/dL (ref 8.6–10.3)
Chloride: 103 mmol/L (ref 98–110)
Creat: 1.26 mg/dL (ref 0.60–1.29)
Globulin: 2.2 g/dL (ref 1.9–3.7)
Glucose, Bld: 122 mg/dL — ABNORMAL HIGH (ref 65–99)
Potassium: 3.9 mmol/L (ref 3.5–5.3)
Sodium: 139 mmol/L (ref 135–146)
Total Bilirubin: 1.5 mg/dL — ABNORMAL HIGH (ref 0.2–1.2)
Total Protein: 6.6 g/dL (ref 6.1–8.1)

## 2024-09-21 LAB — CBC WITH DIFFERENTIAL/PLATELET
Absolute Lymphocytes: 992 {cells}/uL (ref 850–3900)
Absolute Monocytes: 255 {cells}/uL (ref 200–950)
Basophils Absolute: 49 {cells}/uL (ref 0–200)
Basophils Relative: 1.3 %
Eosinophils Absolute: 42 {cells}/uL (ref 15–500)
Eosinophils Relative: 1.1 %
HCT: 42.3 % (ref 38.5–50.0)
Hemoglobin: 14.2 g/dL (ref 13.2–17.1)
MCH: 29.6 pg (ref 27.0–33.0)
MCHC: 33.6 g/dL (ref 32.0–36.0)
MCV: 88.3 fL (ref 80.0–100.0)
MPV: 10.9 fL (ref 7.5–12.5)
Monocytes Relative: 6.7 %
Neutro Abs: 2462 {cells}/uL (ref 1500–7800)
Neutrophils Relative %: 64.8 %
Platelets: 133 Thousand/uL — ABNORMAL LOW (ref 140–400)
RBC: 4.79 Million/uL (ref 4.20–5.80)
RDW: 13.9 % (ref 11.0–15.0)
Total Lymphocyte: 26.1 %
WBC: 3.8 Thousand/uL (ref 3.8–10.8)

## 2024-09-21 LAB — TSH: TSH: 0.91 m[IU]/L (ref 0.40–4.50)

## 2024-09-21 LAB — LIPID PANEL
Cholesterol: 129 mg/dL (ref <200)
HDL: 41 mg/dL (ref >=40)
LDL Cholesterol (Calc): 68 mg/dL
Non-HDL Cholesterol (Calc): 88 mg/dL (ref <130)
Total CHOL/HDL Ratio: 3.1 (calc) (ref <5.0)
Triglycerides: 117 mg/dL (ref <150)

## 2024-09-21 LAB — HEMOGLOBIN A1C
Hgb A1c MFr Bld: 5.9 % — ABNORMAL HIGH (ref <5.7)
Mean Plasma Glucose: 123 mg/dL
eAG (mmol/L): 6.8 mmol/L

## 2024-09-25 NOTE — Progress Notes (Signed)
 Provider: Roxan Plough FNP-C   Sharran Caratachea, Roxan BROCKS, NP  Patient Care Team: Kagen Kunath, Roxan BROCKS, NP as PCP - General (Family Medicine) [name], Alm GORMAN RIGGERS [facility] Medicine)  Extended Emergency Contact Information Primary Emergency Contact: Smith-Zadrozny,Dana  [place] Home Phone: [phone] Relation: Spouse  Code Status: Full code Goals of care: Advanced Directive information    09/14/2024    2:44 PM  Advanced Directives  Does Patient Have a Medical Advance Directive? No  Would patient like information on creating a medical advance directive? No - Patient declined     Chief Complaint  Patient presents with   Annual Exam    Physical.     Discussed the use of AI scribe software for clinical note transcription with the patient, who gave verbal consent to proceed.  History of Present Illness   [name] is a 48 year old male who presents for an annual physical exam.  He has a history of hypertension with home blood pressure readings typically around 150/98-99 mmHg. His current medications include metoprolol  25 mg twice daily, valsartan  160 mg daily, and spironolactone 50 mg daily.  He weighs 329 lbs, a slight decrease from 330 lbs at his last visit. He reports eating less, consuming more vegetables, and reducing starch and heavy foods at night. He has not been engaging in regular exercise due to ankle pain, which worsens with prolonged walking. He is awaiting a brace for his ankle, expected in mid-December.  He is on atorvastatin  10 mg daily for cholesterol management and continues to use semaglutide  (Ozempic ) at 1 mg. He reports no gastrointestinal side effects from this medication. He has not experienced any recent gout flare-ups and is not taking colchicine .  He uses a CPAP machine for sleep apnea without issues. He also takes Cialis  5 mg and Vyvanse 70 mg daily.  He experiences nasal congestion, which he attributes to weather changes, and uses Flonase  regularly, though he did not use it today. No numbness or tingling in his legs and no recent wounds on his feet, though he trims his own toenails.    Past Medical History:  Diagnosis Date   ADHD (attention deficit hyperactivity disorder)    Anxiety    Depression    Hypertension    Migraine    Nasal congestion 10/03/2014   Non-insulin  dependent type 2 diabetes mellitus (HCC)    Obesity    Sleep apnea    Wears contact lenses    Past Surgical History:  Procedure Laterality Date   CHOLECYSTECTOMY N/A 10/09/2014   Procedure: LAPAROSCOPIC CHOLECYSTECTOMY;  Surgeon: Vicenta Poli, MD;  Location: [facility];  Service: General;  Laterality: N/A;    No Known Allergies  Allergies as of 09/14/2024   No Known Allergies      Medication List        Accurate as of September 14, 2024 11:59 PM. If you have any questions, ask your nurse or doctor.          STOP taking these medications    colchicine  0.6 MG tablet Stopped by: Lachlan Mckim C [name] Bonura       TAKE these medications    atorvastatin  10 MG tablet Commonly known as: LIPITOR TAKE 1 TABLET BY MOUTH EVERY DAY   Blood Glucose Monitoring Suppl Devi 1 each by Does not apply route in the morning, at noon, and at bedtime. May substitute to any manufacturer covered by patient's insurance.   Blood Pressure Kit 1 each by Does  not apply route daily.   metoprolol  tartrate 25 MG tablet Commonly known as: LOPRESSOR  TAKE 1 TABLET BY MOUTH TWICE A DAY   Semaglutide  (1 MG/DOSE) 4 MG/3ML Sopn Inject 1 mg as directed once a week.   spironolactone 50 MG tablet Commonly known as: ALDACTONE Take 50 mg by mouth daily.   tadalafil  5 MG tablet Commonly known as: CIALIS  TAKE 1 TABLET BY MOUTH DAILY AS NEEDED FOR ERECTILE DYSFUNCTION.   valsartan  160 MG tablet Commonly known as: DIOVAN  TAKE 1 TABLET BY MOUTH EVERY DAY   Vyvanse 20 MG capsule Generic drug: lisdexamfetamine Take 20 mg by mouth daily.   Vyvanse 50 MG  capsule Generic drug: lisdexamfetamine Take 50 mg by mouth daily.        Review of Systems  Constitutional:  Negative for appetite change, chills, fatigue, fever and unexpected weight change.  HENT:  Positive for congestion. Negative for dental problem, ear discharge, ear pain, facial swelling, hearing loss, nosebleeds, postnasal drip, rhinorrhea, sinus pressure, sinus pain, sneezing, sore throat, tinnitus and trouble swallowing.   Eyes:  Negative for pain, discharge, redness, itching and visual disturbance.  Respiratory:  Negative for cough, chest tightness, shortness of breath and wheezing.   Cardiovascular:  Negative for chest pain, palpitations and leg swelling.  Gastrointestinal:  Negative for abdominal distention, abdominal pain, blood in stool, constipation, diarrhea, nausea and vomiting.  Endocrine: Negative for cold intolerance, heat intolerance, polydipsia, polyphagia and polyuria.  Genitourinary:  Negative for difficulty urinating, dysuria, flank pain, frequency and urgency.  Musculoskeletal:  Negative for arthralgias, back pain, gait problem, joint swelling, myalgias, neck pain and neck stiffness.  Skin:  Negative for color change, pallor, rash and wound.  Neurological:  Negative for dizziness, syncope, speech difficulty, weakness, light-headedness, numbness and headaches.  Hematological:  Does not bruise/bleed easily.  Psychiatric/Behavioral:  Negative for agitation, behavioral problems, confusion, hallucinations, self-injury, sleep disturbance and suicidal ideas. The patient is not nervous/anxious.     Immunization History  Administered Date(s) Administered   Influenza,inj,Quad PF,6+ Mos 07/05/2015, 08/19/2016, 07/29/2019, 10/17/2021   PFIZER(Purple Top)SARS-COV-2 Vaccination 03/29/2020, 04/19/2020, 08/19/2021   Pneumococcal Polysaccharide-23 05/25/2017   Tdap 05/25/2017   Pertinent  Health Maintenance Due  Topic Date Due   Colonoscopy  Never done   Influenza Vaccine   03/14/2025 (Originally 06/03/2024)   HEMOGLOBIN A1C  03/20/2025   OPHTHALMOLOGY EXAM  06/21/2025   FOOT EXAM  09/14/2025      08/06/2023    1:04 PM 08/21/2023   10:49 AM 12/30/2023   10:31 AM 05/11/2024   10:33 AM 09/14/2024    2:42 PM  Fall Risk  Falls in the past year? 0 0 0 0 0  Was there an injury with Fall?   0 0 0  Fall Risk Category Calculator   0 0 0  Patient at Risk for Falls Due to    No Fall Risks No Fall Risks  Fall risk Follow up    Falls evaluation completed Falls evaluation completed   Functional Status Survey:    Vitals:   09/14/24 1449  BP: 138/88  Pulse: 89  Resp: 19  Temp: 98.2 F (36.8 C)  SpO2: 95%  Weight: (!) 329 lb 4.8 oz (149.4 kg)  Height: 5' 9 (1.753 m)   Body mass index is 48.63 kg/m. Physical Exam Physical Exam   VITALS: T- 98.2, P- 89, BP- 138/88, SaO2- 95% MEASUREMENTS: Weight- 329. GENERAL: Alert, cooperative, well developed, no acute distress HEENT: Normocephalic, normal oropharynx, moist mucous membranes, ears and  nose normal, no sinus tenderness NECK: No lymphadenopathy CHEST: Clear to auscultation bilaterally, no wheezes, rhonchi, or crackles CARDIOVASCULAR: Normal heart rate and rhythm, S1 and S2 normal without murmurs ABDOMEN: Soft, non-tender, non-distended, without organomegaly, normal bowel sounds EXTREMITIES: No cyanosis or edema MUSCULOSKELETAL: No spinal tenderness NEUROLOGICAL: Cranial nerves grossly intact, moves all extremities without gross motor or sensory deficit, sensation intact in feet SKIN: No wounds or fungal infection on feet   Labs reviewed: Recent Labs    12/30/23 1135 03/11/24 1501 09/20/24 1040  NA 139 141 139  K 4.0 4.0 3.9  CL 105 103 103  CO2 25 20 26   GLUCOSE 97 122* 122*  BUN 18 16 17   CREATININE 1.31* 1.45* 1.26  CALCIUM  9.2 9.3 9.3   Recent Labs    11/17/23 1057 12/30/23 1135 09/20/24 1040  AST 11* 11 16  ALT 14 16 18   ALKPHOS 55  --   --   BILITOT 1.0 1.0 1.5*  PROT 7.1 6.8 6.6   ALBUMIN 4.3  --   --    Recent Labs    12/30/23 1135 03/11/24 1501 09/20/24 1040  WBC 3.8 4.1 3.8  NEUTROABS 2,261 2.6 2,462  HGB 14.0 14.0 14.2  HCT 42.2 41.1 42.3  MCV 87.2 88 88.3  PLT 116* 122* 133*   Lab Results  Component Value Date   TSH 0.91 09/20/2024   Lab Results  Component Value Date   HGBA1C 5.9 (H) 09/20/2024   Lab Results  Component Value Date   CHOL 129 09/20/2024   HDL 41 09/20/2024   LDLCALC 68 09/20/2024   TRIG 117 09/20/2024   CHOLHDL 3.1 09/20/2024    Significant Diagnostic Results in last 30 days:  No results found.  Assessment/Plan  Adult Wellness Visit Routine adult wellness visit with vital signs within acceptable range. Weight decreased from 330 lbs to 229 lbs. No exercise routine, but dietary changes noted. No numbness or tingling in legs. Patient reports dental visit this year. Patient reports drinking a little bit of alcohol. - Encouraged continuation of healthy diet - Recommended starting walking exercise or swimming/water aerobics - Scheduled fasting lab work to recheck A1c and other parameters  Essential hypertension Blood pressure readings at home are slightly elevated (150/98-99 mmHg), with office reading at 138/88 mmHg. Goal is to maintain blood pressure below 130/80 mmHg. - Continue metoprolol  25 mg twice daily - Continue valsartan  160 mg daily  Type 2 diabetes mellitus A1c previously reduced to 5.6%. Current medication includes semaglutide  1 mg. Discussed potential switch to Zepbound for better management of diabetes and sleep apnea. No recent gout flare-ups, colchicine  discontinued. - Ordered fasting lab work to recheck A1c - Will discuss with insurance about approval for Zepbound - Continue semaglutide  1 mg daily  Hyperlipidemia Currently managed with atorvastatin  10 mg daily. - Continue atorvastatin  10 mg daily  Obstructive sleep apnea Continues to use CPAP. Discussed potential switch to Zepbound for management of  sleep apnea and diabetes. 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 Diabetes complicates wound healing. - Referred to podiatrist for toenail trimming  Left ankle pain with planned brace fitting Left ankle pain exacerbated by walking. Brace fitting scheduled for October, expected delivery by mid-December. Swimming or water aerobics recommended as low-impact exercise options. - Await brace fitting and delivery - Recommended swimming or water aerobics as exercise options  Family/ staff Communication: Reviewed plan of  care with patient verbalized understanding  Labs/tests ordered:  - CBC with Differential/Platelet - CMP with eGFR([facility]) - TSH - Hgb A1C - Lipid panel - urine microalbumin creatinine   Next Appointment : Return in about 4 months (around 01/12/2025) for medical mangement of chronic issues.Fasting labs soon .   Spent 30 minutes of Face to face and non-face to face with patient  >50% time spent counseling; reviewing medical record; tests; labs; documentation and developing future plan of care.   Roxan JAYSON Plough, NP

## 2024-09-27 ENCOUNTER — Other Ambulatory Visit: Payer: Self-pay | Admitting: Family

## 2024-09-27 NOTE — Telephone Encounter (Signed)
 Pharmacy requested refill.  ?Pended Rx and sent to [facility] for approval.  ?

## 2024-10-11 ENCOUNTER — Ambulatory Visit: Admitting: Podiatry

## 2024-10-12 ENCOUNTER — Ambulatory Visit: Admitting: Psychology

## 2024-10-12 DIAGNOSIS — F4323 Adjustment disorder with mixed anxiety and depressed mood: Secondary | ICD-10-CM

## 2024-10-12 DIAGNOSIS — Q6601 Congenital talipes equinovarus, right foot: Secondary | ICD-10-CM | POA: Diagnosis not present

## 2024-10-12 NOTE — Progress Notes (Signed)
 [facility] Counselor/Therapist Progress Note  Patient ID: [name], MRN: [id number],    Date: 10/12/2024  Time Spent: 30 mins; start time: 1000; end time: 1030  Treatment Type: Individual Therapy  Reported Symptoms: Pt presents for session via Caregility video.  Pt grants consent for session, stating he is in his home with no one else present; pt also shares that he understands the limitations of virtual session.  I shared with pt that I am in my office with no one else here either.  Mental Status Exam: Appearance:  Casual     Behavior: Appropriate  Motor: Normal  Speech/Language:  Clear and Coherent  Affect: Appropriate  Mood: normal  Thought process: normal  Thought content:   WNL  Sensory/Perceptual disturbances:   WNL  Orientation: oriented to person, place, and time/date  Attention: Good  Concentration: Good  Memory: WNL  Fund of knowledge:  Good  Insight:   Good  Judgment:  Good  Impulse Control: Good   Risk Assessment: Danger to Self:  No Self-injurious Behavior: No Danger to Others: No Duty to Warn:no Physical Aggression / Violence:No  Access to Firearms a concern: No  Gang Involvement:No   Subjective: Pt shares that, I have been doing pretty well since our last session.  We had a good Thanksgiving with our family.  My mom usually does a huge event with her cooking and she scaled her efforts back this year and that was a good decision.  We are doing more collaboration for the Christmas meal and taking some more of the pressure off of mom.  I missed not having my son here from [facility] but he will be here for Christmas; we got that figured out.  He will be here for about a week.  His son is 2/3 of the way through his program; he will graduate at the end of March.; he will be able to finalize his job offers (looking at 3 right now) after their last job fair up there in late Feb.  His son is really experiencing the [facility] winter and it is unlike anything he has  experienced before.  Pt's oldest son is still working at [employer] and living with his girlfriend.  His youngest son is excited about his choice of [employer] for college.  Pt shares that, with everything going on in his life right now, he is feeling a bit overwhelmed; encouraged pt to talk with his wife about his feelings so they can partner together on everything that needs to happen.  Pt shares that work for him is going pretty well.  His statistics continue to be positive and he is meeting his goals set for him.  He was able to level up from Rep 1 to Rep 2.  He will pursue the Rep 3 next year.  Pt shares that his ankle continues to be doing well; he has an appt today with the folks about his new ankle brace.  Pt continues work Sun, Mon, [place], and Fri and is off on Tues, Wed, and Sat.  Pt reports that he is sleeping OK of late.  Encouraged pt to be intentional about engaging in his self care activities and we will meet in 3 weeks for a follow up session.  Interventions: Cognitive Behavioral Therapy  Diagnosis:Adjustment disorder with mixed anxiety and depressed mood  Plan: Treatment Plan Strengths/Abilities:  Intelligent, Intuitive, Willing to participate in therapy Treatment Preferences:  Outpatient Individual Therapy Statement of Needs:  Patient is to use CBT,  mindfulness and coping skills to help manage and/or decrease symptoms associated with their diagnosis. Symptoms:  Depressed/Irritable mood, worry, social withdrawal Problems Addressed:  Depressive thoughts, Sadness, Sleep issues, etc. Long Term Goals:  Pt to reduce overall level, frequency, and intensity of the feelings of depression/anxiety as evidenced by decreased irritability, negative self talk, and helpless feelings from 6 to 7 days/week to 0 to 1 days/week, per client report, for at least 3 consecutive months.  Progress: 30% Short Term Goals:  Pt to verbally express understanding of the relationship between feelings of depression/anxiety and  their impact on thinking patterns and behaviors.  Pt to verbalize an understanding of the role that distorted thinking plays in creating fears, excessive worry, and ruminations.  Progress: 30% Target Date:  04/24/2025 Frequency:  Bi-weekly Modality:  Cognitive Behavioral Therapy Interventions by Therapist:  Therapist will use CBT, Mindfulness exercises, Coping skills and Referrals, as needed by client. Client has verbally approved this treatment plan.  Francis KATHEE Macintosh, [facility]

## 2024-10-28 ENCOUNTER — Other Ambulatory Visit: Payer: Self-pay | Admitting: Family

## 2024-10-28 DIAGNOSIS — I10 Essential (primary) hypertension: Secondary | ICD-10-CM

## 2024-10-28 NOTE — Telephone Encounter (Signed)
 Pharmacy requested refill.  Pended Rx and sent to [facility] for Approval due to HIGH ALERT Warning.

## 2024-11-02 ENCOUNTER — Ambulatory Visit: Admitting: Psychology

## 2024-11-02 DIAGNOSIS — F4323 Adjustment disorder with mixed anxiety and depressed mood: Secondary | ICD-10-CM | POA: Diagnosis not present

## 2024-11-02 NOTE — Progress Notes (Signed)
 [facility] Counselor/Therapist Progress Note  Patient ID: [name], MRN: [id number],    Date: 11/02/2024  Time Spent: 47 mins; start time: 1000; end time: 1047  Treatment Type: Individual Therapy  Reported Symptoms: Pt presents for session via Caregility video.  Pt grants consent for session, stating he is in his home with no one else present; pt also shares that he understands the limitations of virtual session.  I shared with pt that I am in my office with no one else here either.  Mental Status Exam: Appearance:  Casual     Behavior: Appropriate  Motor: Normal  Speech/Language:  Clear and Coherent  Affect: Appropriate  Mood: normal  Thought process: normal  Thought content:   WNL  Sensory/Perceptual disturbances:   WNL  Orientation: oriented to person, place, and time/date  Attention: Good  Concentration: Good  Memory: WNL  Fund of knowledge:  Good  Insight:   Good  Judgment:  Good  Impulse Control: Good   Risk Assessment: Danger to Self:  No Self-injurious Behavior: No Danger to Others: No Duty to Warn:no Physical Aggression / Violence:No  Access to Firearms a concern: No  Gang Involvement:No   Notified of retirement  Subjective: Pt shares that, I have been doing pretty well since our last session.  We had a good Christmas together with family; my middle son came down from [facility] for a few days and it was good to have him here.  Everyone was glad to interact with him while he was here.  Also, my god father is having some issues with his back and he had heart condition so surgery is less of an option for him.  Pt also shares that his ADHD has not filled his prescription because he needs an office visit; they have worked those issues out and he can pick up his medication today.  Pt shares his mom and dad were doing well over the holidays.  Pt shares that his wife was off last week and this week with the schools closed.  Pt shares that he and his wife  rarely have New Year's Eve plans.  Pt shares that his son is doing well in his program in [facility] and is set to graduate in March and he continues to interview for jobs at job fairs while there.  His oldest son continues to work at [employer] and is doing well with that; he continues to live with his girlfriend and that relationship is up and down.  His youngest son is excited about his choice of [employer] for college.  Pt shares, except for his being without his ADHD medication for a while, work has been fine.  He has a practical approach to work that it is a means to an end.  Pt shares that he did get his ankle brace but he cannot get is foot in any of his existing shoes; they have ordered another pair of shoes for him in hopes that it will fit for him.  He has not been able to use the brace to this point.  Pt continues work Sun, Mon, [place], and Fri and is off on Tues, Wed, and Sat.  Pt reports that he is sleeping OK of late.  Encouraged pt to be intentional about engaging in his self care activities and we will meet in 2 weeks for a follow up session.  Interventions: Cognitive Behavioral Therapy  Diagnosis:Adjustment disorder with mixed anxiety and depressed mood  Plan: Treatment Plan Strengths/Abilities:  Intelligent,  Intuitive, Willing to participate in therapy Treatment Preferences:  Outpatient Individual Therapy Statement of Needs:  Patient is to use CBT, mindfulness and coping skills to help manage and/or decrease symptoms associated with their diagnosis. Symptoms:  Depressed/Irritable mood, worry, social withdrawal Problems Addressed:  Depressive thoughts, Sadness, Sleep issues, etc. Long Term Goals:  Pt to reduce overall level, frequency, and intensity of the feelings of depression/anxiety as evidenced by decreased irritability, negative self talk, and helpless feelings from 6 to 7 days/week to 0 to 1 days/week, per client report, for at least 3 consecutive months.  Progress: 30% Short Term Goals:  Pt to  verbally express understanding of the relationship between feelings of depression/anxiety and their impact on thinking patterns and behaviors.  Pt to verbalize an understanding of the role that distorted thinking plays in creating fears, excessive worry, and ruminations.  Progress: 30% Target Date:  04/24/2025 Frequency:  Bi-weekly Modality:  Cognitive Behavioral Therapy Interventions by Therapist:  Therapist will use CBT, Mindfulness exercises, Coping skills and Referrals, as needed by client. Client has verbally approved this treatment plan.  Francis KATHEE Macintosh, [facility]

## 2024-11-15 ENCOUNTER — Ambulatory Visit (INDEPENDENT_AMBULATORY_CARE_PROVIDER_SITE_OTHER): Payer: Self-pay | Admitting: Family

## 2024-11-15 ENCOUNTER — Encounter: Payer: Self-pay | Admitting: Family

## 2024-11-15 VITALS — BP 138/88 | HR 93 | Temp 97.8°F | Resp 20 | Ht 69.0 in | Wt 332.0 lb

## 2024-11-15 DIAGNOSIS — E119 Type 2 diabetes mellitus without complications: Secondary | ICD-10-CM | POA: Diagnosis not present

## 2024-11-15 DIAGNOSIS — G4733 Obstructive sleep apnea (adult) (pediatric): Secondary | ICD-10-CM

## 2024-11-15 DIAGNOSIS — I10 Essential (primary) hypertension: Secondary | ICD-10-CM

## 2024-11-15 DIAGNOSIS — Z1211 Encounter for screening for malignant neoplasm of colon: Secondary | ICD-10-CM

## 2024-11-15 DIAGNOSIS — E669 Obesity, unspecified: Secondary | ICD-10-CM | POA: Diagnosis not present

## 2024-11-15 DIAGNOSIS — E785 Hyperlipidemia, unspecified: Secondary | ICD-10-CM | POA: Diagnosis not present

## 2024-11-15 DIAGNOSIS — Z7985 Long-term (current) use of injectable non-insulin antidiabetic drugs: Secondary | ICD-10-CM | POA: Diagnosis not present

## 2024-11-15 LAB — HEPATIC FUNCTION PANEL
AG Ratio: 1.7 (calc) (ref 1.0–2.5)
ALT: 18 U/L (ref 9–46)
AST: 12 U/L (ref 10–40)
Albumin: 4.2 g/dL (ref 3.6–5.1)
Alkaline phosphatase (APISO): 53 U/L (ref 36–130)
Bilirubin, Direct: 0.2 mg/dL (ref 0.0–0.2)
Globulin: 2.5 g/dL (ref 1.9–3.7)
Indirect Bilirubin: 0.7 mg/dL (ref 0.2–1.2)
Total Bilirubin: 0.9 mg/dL (ref 0.2–1.2)
Total Protein: 6.7 g/dL (ref 6.1–8.1)

## 2024-11-15 MED ORDER — SEMAGLUTIDE (1 MG/DOSE) 4 MG/3ML ~~LOC~~ SOPN: 2.0000 mg | PEN_INJECTOR | SUBCUTANEOUS | 3 refills | Status: AC

## 2024-11-15 NOTE — Progress Notes (Signed)
 "  Provider: Daquon Greenleaf FNP-C   Zen Cedillos, Roxan BROCKS, NP  Patient Care Team: Cortnee Steinmiller, Roxan BROCKS, NP as PCP - General (Family Medicine) [name], Alm GORMAN RIGGERS [facility] Medicine)  Extended Emergency Contact Information Primary Emergency Contact: Smith-Gougeon,Dana  [place] Home Phone: [phone] Relation: Spouse  Code Status:  Full Code  Goals of care: Advanced Directive information    11/15/2024    9:52 AM  Advanced Directives  Does Patient Have a Medical Advance Directive? No  Would patient like information on creating a medical advance directive? No - Patient declined     Chief Complaint  Patient presents with   Medical Management of Chronic Issues    6 Month follow up.   History of Present Illness   [name] is a 49 year old male with hypertension and diabetes who presents for a six-month follow-up appointment.  He has been monitoring his blood pressure at home, with readings typically between 130-140/80-95 mmHg after taking his medication. He notes that his blood pressure tends to rise a few hours post-medication. No chest pain, shortness of breath, dizziness, fatigue, or headaches.  He reports a weight gain since his last visit, attributing it to the holidays and his cooking. His current weight is 332 pounds, up from 329 pounds. He is not engaging in much exercise but is in the process of getting a brace for his ankle and is looking for suitable shoes to accommodate it. He is considering water exercises to reduce pressure on his ankle.  His current medications include atorvastatin  for cholesterol, metoprolol  25 mg twice daily, semaglutide  1 mg, spironolactone 50 mg daily, valsartan  160 mg for blood pressure, and Vyvanse 20 mg and 50 mg. He has stopped taking Cialis . He is following up with a psychiatrist for Vyvanse management. He has experienced issues with insurance coverage for a higher dosage of semaglutide  in the past.  His recent lab work from  November showed a glucose level of 122 mg/dL, improved kidney function with creatinine reduced from 1.45 to 1.27 mg/dL, and a slightly elevated bilirubin level. His A1c has increased to 5.9%. His cholesterol levels are within target ranges with total cholesterol at 129 mg/dL, HDL at 41 mg/dL, triglycerides at 882 mg/dL, and LDL at 68 mg/dL.  He has not had a colonoscopy and is considering a Cologuard test for colon cancer screening. He works Monday, Thursday, Friday, and Saturday, with days off on Tuesday and Wednesday. He declines flu shot, pneumonia vaccine, or COVID vaccine. He was unable to attend a podiatry appointment due to scheduling conflicts and inclement weather.   Past Medical History:  Diagnosis Date   ADHD (attention deficit hyperactivity disorder)    Anxiety    Depression    Hypertension    Migraine    Nasal congestion 10/03/2014   Non-insulin  dependent type 2 diabetes mellitus (HCC)    Obesity    Sleep apnea    Wears contact lenses    Past Surgical History:  Procedure Laterality Date   CHOLECYSTECTOMY N/A 10/09/2014   Procedure: LAPAROSCOPIC CHOLECYSTECTOMY;  Surgeon: Vicenta Poli, MD;  Location: [facility];  Service: General;  Laterality: N/A;    Allergies[1]  Allergies as of 11/15/2024   No Known Allergies      Medication List        Accurate as of November 15, 2024 10:25 AM. If you have any questions, ask your nurse or doctor.          STOP taking  these medications    tadalafil  5 MG tablet Commonly known as: CIALIS  Stopped by: Roxan Plough, NP       TAKE these medications    atorvastatin  10 MG tablet Commonly known as: LIPITOR TAKE 1 TABLET BY MOUTH EVERY DAY   Blood Glucose Monitoring Suppl Devi 1 each by Does not apply route in the morning, at noon, and at bedtime. May substitute to any manufacturer covered by patient's insurance.   Blood Pressure Kit 1 each by Does not apply route daily.   metoprolol  tartrate 25 MG  tablet Commonly known as: LOPRESSOR  TAKE 1 TABLET BY MOUTH TWICE A DAY   Semaglutide  (1 MG/DOSE) 4 MG/3ML Sopn Inject 2 mg as directed once a week. What changed: how much to take Changed by: Oluchi Pucci, NP   spironolactone 50 MG tablet Commonly known as: ALDACTONE Take 50 mg by mouth daily.   valsartan  160 MG tablet Commonly known as: DIOVAN  TAKE 1 TABLET BY MOUTH EVERY DAY   Vyvanse 20 MG capsule Generic drug: lisdexamfetamine Take 20 mg by mouth daily.   Vyvanse 50 MG capsule Generic drug: lisdexamfetamine Take 50 mg by mouth daily.        Review of Systems  Constitutional:  Negative for appetite change, chills, fatigue, fever and unexpected weight change.  HENT:  Negative for congestion, dental problem, ear discharge, ear pain, hearing loss, nosebleeds, postnasal drip, rhinorrhea, sinus pressure, sinus pain, sneezing, sore throat, tinnitus and trouble swallowing.   Eyes:  Negative for pain, discharge, redness, itching and visual disturbance.  Respiratory:  Negative for cough, chest tightness, shortness of breath and wheezing.   Cardiovascular:  Negative for chest pain, palpitations and leg swelling.  Gastrointestinal:  Negative for abdominal distention, abdominal pain, blood in stool, constipation, diarrhea, nausea and vomiting.  Endocrine: Negative for cold intolerance, heat intolerance, polydipsia, polyphagia and polyuria.  Genitourinary:  Negative for difficulty urinating, dysuria, flank pain, frequency and urgency.  Musculoskeletal:  Positive for arthralgias. Negative for back pain, gait problem, joint swelling, myalgias, neck pain and neck stiffness.       Right ankle pain   Skin:  Negative for color change, pallor, rash and wound.  Neurological:  Negative for dizziness, syncope, speech difficulty, weakness, light-headedness, numbness and headaches.  Hematological:  Does not bruise/bleed easily.  Psychiatric/Behavioral:  Negative for agitation, behavioral  problems, confusion, hallucinations, self-injury, sleep disturbance and suicidal ideas. The patient is not nervous/anxious.     Immunization History  Administered Date(s) Administered   Influenza,inj,Quad PF,6+ Mos 07/05/2015, 08/19/2016, 07/29/2019, 10/17/2021   PFIZER(Purple Top)SARS-COV-2 Vaccination 03/29/2020, 04/19/2020, 08/19/2021   Pneumococcal Polysaccharide-23 05/25/2017   Tdap 05/25/2017   Pertinent  Health Maintenance Due  Topic Date Due   Colonoscopy  Never done   Influenza Vaccine  03/14/2025 (Originally 06/03/2024)   HEMOGLOBIN A1C  03/20/2025   OPHTHALMOLOGY EXAM  06/21/2025   FOOT EXAM  09/14/2025      08/21/2023   10:49 AM 12/30/2023   10:31 AM 05/11/2024   10:33 AM 09/14/2024    2:42 PM 11/15/2024    9:52 AM  Fall Risk  Falls in the past year? 0 0 0 0 0  Was there an injury with Fall?  0  0  0  0  Fall Risk Category Calculator  0 0 0 0  Patient at Risk for Falls Due to   No Fall Risks No Fall Risks No Fall Risks  Fall risk Follow up   Falls evaluation completed Falls evaluation completed Falls evaluation  completed     Data saved with a previous flowsheet row definition   Functional Status Survey:    Vitals:   11/15/24 0953  BP: 138/88  Pulse: 93  Resp: 20  Temp: 97.8 F (36.6 C)  SpO2: 97%  Weight: (!) 332 lb (150.6 kg)  Height: 5' 9 (1.753 m)   Body mass index is 49.03 kg/m. Physical Exam  VITALS: T- 97.8, P- 93, BP- 138/88, SaO2- 97% MEASUREMENTS: Weight- 332. GENERAL: Alert, cooperative, well developed, no acute distress. HEENT: Normocephalic, normal oropharynx, moist mucous membranes, tympanic membranes normal, nose without swelling, no frontal sinus tenderness. NECK: No lymphadenopathy. CHEST: Clear to auscultation bilaterally, no wheezes, rhonchi, or crackles. CARDIOVASCULAR: Normal heart rate and rhythm, S1 and S2 normal without murmurs. ABDOMEN: Soft, non-tender, non-distended, without organomegaly, normal bowel sounds. EXTREMITIES: No  cyanosis or edema, no tingling or numbness in legs, no tenderness in feet, sensation intact in feet, no calluses on feet. NEUROLOGICAL: Cranial nerves grossly intact, moves all extremities without gross motor or sensory deficit.  SKIN: No rash,no lesion or erythema   PSYCHIATRY/BEHAVIORAL: Mood stable   Labs reviewed: Recent Labs    12/30/23 1135 03/11/24 1501 09/20/24 1040  NA 139 141 139  K 4.0 4.0 3.9  CL 105 103 103  CO2 25 20 26   GLUCOSE 97 122* 122*  BUN 18 16 17   CREATININE 1.31* 1.45* 1.26  CALCIUM  9.2 9.3 9.3   Recent Labs    11/17/23 1057 12/30/23 1135 09/20/24 1040  AST 11* 11 16  ALT 14 16 18   ALKPHOS 55  --   --   BILITOT 1.0 1.0 1.5*  PROT 7.1 6.8 6.6  ALBUMIN 4.3  --   --    Recent Labs    12/30/23 1135 03/11/24 1501 09/20/24 1040  WBC 3.8 4.1 3.8  NEUTROABS 2,261 2.6 2,462  HGB 14.0 14.0 14.2  HCT 42.2 41.1 42.3  MCV 87.2 88 88.3  PLT 116* 122* 133*   Lab Results  Component Value Date   TSH 0.91 09/20/2024   Lab Results  Component Value Date   HGBA1C 5.9 (H) 09/20/2024   Lab Results  Component Value Date   CHOL 129 09/20/2024   HDL 41 09/20/2024   LDLCALC 68 09/20/2024   TRIG 117 09/20/2024   CHOLHDL 3.1 09/20/2024    Significant Diagnostic Results in last 30 days:  No results found.  Assessment/Plan  Type 2 diabetes mellitus in patient with obesity Type 2 diabetes mellitus with recent increase in A1c to 5.9, indicating suboptimal glycemic control. Weight gain noted, likely due to holiday season and limited exercise. Current medication regimen includes semaglutide , which is well-tolerated. - Increased semaglutide  dosage to 2 mg and will monitor response for one month. - Encouraged weight management through dietary modifications and increased physical activity, including water exercises. - Ordered lab work to monitor glucose levels and kidney function.  Essential hypertension Blood pressure readings at home range from  130-140/80-95 mmHg, with occasional spikes. Current medications include metoprolol , spironolactone, and valsartan . Goal is to maintain blood pressure at 130/80 mmHg or below, especially due to diabetes. - Continue current antihypertensive regimen. - Monitor blood pressure regularly at home. - Educated on the importance of maintaining blood pressure within target range.  Hyperlipidemia Well-controlled with atorvastatin . Recent lab results show total cholesterol at 129 mg/dL, HDL at 41 mg/dL, triglycerides at 882 mg/dL, and LDL at 68 mg/dL, all within target ranges. - Continue atorvastatin  therapy.  Thrombocytopenia Chronic thrombocytopenia with recent improvement  in platelet count to 133,000/L from 122,000/L. No signs of bleeding or anemia. No current oncology follow-up as per previous specialist's advice. - Continue monitoring platelet levels.  OSA on CPAP  - continue on C-pap machine at bedtime   Elevated liver enzymes Mildly elevated bilirubin noted, possibly related to alcohol consumption. - Advised reduction in alcohol consumption. - Ordered repeat hepatic panel to monitor liver enzymes.  General health maintenance Discussed the importance of colon cancer screening, especially given increased risk in African American population. He prefers non-invasive screening option. - Ordered Cologuard for colon cancer screening. - Encouraged reduction in red meat consumption to lower colon cancer risk.   Family/ staff Communication: Reviewed plan of care with patient verbalized understanding   Labs/tests ordered:  - CBC with Differential/Platelet - CMP with eGFR([facility]) - TSH - Hgb A1C - Lipid panel - Hepatic panel    Next Appointment : Return in about 6 months (around 05/15/2025) for medical mangement of chronic issues., Fasting labs in 6 months prior to visit.   Spent 30 minutes of Face to face and non-face to face with patient  >50% time spent counseling; reviewing medical record;  tests; labs; documentation and developing future plan of care.   Roxan BROCKS Kingstin Heims, NP      [1] No Known Allergies  "

## 2024-11-16 ENCOUNTER — Ambulatory Visit: Admitting: Psychology

## 2024-11-16 ENCOUNTER — Ambulatory Visit: Payer: Self-pay | Admitting: Family

## 2024-12-07 ENCOUNTER — Ambulatory Visit: Admitting: Psychology

## 2024-12-07 DIAGNOSIS — F4323 Adjustment disorder with mixed anxiety and depressed mood: Secondary | ICD-10-CM

## 2024-12-07 NOTE — Progress Notes (Signed)
 [facility] Counselor/Therapist Progress Note  Patient ID: [name], MRN: [id number],    Date: 12/07/2024  Time Spent: 58 mins; start time: 1000; end time: 1058  Treatment Type: Individual Therapy  Reported Symptoms: Pt presents for session via Caregility video.  Pt grants consent for session, stating he is in his home with no one else present; pt also shares that he understands the limitations of virtual session.  I shared with pt that I am in my office with no one else here either.  Mental Status Exam: Appearance:  Casual     Behavior: Appropriate  Motor: Normal  Speech/Language:  Clear and Coherent  Affect: Appropriate  Mood: normal  Thought process: normal  Thought content:   WNL  Sensory/Perceptual disturbances:   WNL  Orientation: oriented to person, place, and time/date  Attention: Good  Concentration: Good  Memory: WNL  Fund of knowledge:  Good  Insight:   Good  Judgment:  Good  Impulse Control: Good   Risk Assessment: Danger to Self:  No Self-injurious Behavior: No Danger to Others: No Duty to Warn:no Physical Aggression / Violence:No  Access to Firearms a concern: No  Gang Involvement:No   Notified of retirement  Subjective: Pt shares that, I have been doing pretty well since our last session.  My wife and son have been home because of winter weather for the past 2 weeks and that has been impactful for me with them being in the house while I am working from home.  Pt's wife and son are waiting for word from the [facility] as to whether or not the State will grant exceptions for the school system to count the additional virtual days they have already had; currently the the State allows only 5 virtual education days during the school year.  His son Dirk) in [employer] graduates on 01/21/25 so he has about 6 weeks left.  He is getting tired of being in [employer] and wants to graduate and wants to get a job in the field.  Donovan's birthday is 2/23 and he will be in  [employer] for it.  Pt shares that his uncle passed away the week before the first winter storm so they have not been able to have the funeral yet; they hope to have the service this weekend.  Pt shares his oldest son (28 yo) and his girlfriend are struggling a bit; she is crazy and has a 74 yo son.  They came here on Saturday when the snow started.  The next day, I went to work in my office at home and a bit later I heard some yelling and screaming because the girlfriend was upset at something; it was a tough 3 days for the others of us  in the house.  I feel bad for my son in that situation.  Pt shares his mom and dad were doing well.  His youngest son (turned 30 yo in Dec) is excited about his choice of [employer] for college; he is having focus issues at school; he is in a new relationship with a girl that is not good for him.  He has a practical approach to work that it is a means to an end.  Pt shares that his ankle is about as good as it is going to get; he still does not have shoes that he can wear the brace with.  His dad is doing well for a 47 yo who is still working.  My mom is OK but she is  struggling with the loss of her brother; that was her person and she really misses him.  Pt shares that he is still viewing work as a means to an end; it is going OK.  Pt continues work Sun, Mon, [place], and Fri and is off on Tues, Wed, and Sat.  Pt reports that he is sleeping OK of late.  Pt can get retirement benefits from his employer in another 4 years and his wife can retire from the [place] in about 4 years as well.  Encouraged pt to be intentional about engaging in his self care activities and we will meet in 2 weeks for a follow up session.  Interventions: Cognitive Behavioral Therapy  Diagnosis:Adjustment disorder with mixed anxiety and depressed mood  Plan: Treatment Plan Strengths/Abilities:  Intelligent, Intuitive, Willing to participate in therapy Treatment Preferences:  Outpatient Individual Therapy Statement  of Needs:  Patient is to use CBT, mindfulness and coping skills to help manage and/or decrease symptoms associated with their diagnosis. Symptoms:  Depressed/Irritable mood, worry, social withdrawal Problems Addressed:  Depressive thoughts, Sadness, Sleep issues, etc. Long Term Goals:  Pt to reduce overall level, frequency, and intensity of the feelings of depression/anxiety as evidenced by decreased irritability, negative self talk, and helpless feelings from 6 to 7 days/week to 0 to 1 days/week, per client report, for at least 3 consecutive months.  Progress: 30% Short Term Goals:  Pt to verbally express understanding of the relationship between feelings of depression/anxiety and their impact on thinking patterns and behaviors.  Pt to verbalize an understanding of the role that distorted thinking plays in creating fears, excessive worry, and ruminations.  Progress: 30% Target Date:  04/24/2025 Frequency:  Bi-weekly Modality:  Cognitive Behavioral Therapy Interventions by Therapist:  Therapist will use CBT, Mindfulness exercises, Coping skills and Referrals, as needed by client. Client has verbally approved this treatment plan.  Francis KATHEE Macintosh, [facility]

## 2024-12-21 ENCOUNTER — Ambulatory Visit: Admitting: Psychology

## 2025-01-11 ENCOUNTER — Ambulatory Visit: Admitting: Family

## 2025-05-10 ENCOUNTER — Other Ambulatory Visit

## 2025-05-16 ENCOUNTER — Ambulatory Visit: Admitting: Family
